# Patient Record
Sex: Female | Born: 1955 | Race: White | Hispanic: No | Smoking: Former smoker
Health system: Southern US, Community
[De-identification: ages and names within clinical notes are randomized; demographics above are authoritative.]

## PROBLEM LIST (undated history)

## (undated) DIAGNOSIS — K219 Gastro-esophageal reflux disease without esophagitis: Secondary | ICD-10-CM

## (undated) DIAGNOSIS — J9691 Respiratory failure, unspecified with hypoxia: Secondary | ICD-10-CM

## (undated) DIAGNOSIS — I10 Essential (primary) hypertension: Secondary | ICD-10-CM

## (undated) DIAGNOSIS — J189 Pneumonia, unspecified organism: Secondary | ICD-10-CM

## (undated) DIAGNOSIS — J449 Chronic obstructive pulmonary disease, unspecified: Secondary | ICD-10-CM

## (undated) HISTORY — PX: ENDOMETRIAL ABLATION: SHX621

## (undated) HISTORY — DX: Gastro-esophageal reflux disease without esophagitis: K21.9

## (undated) HISTORY — DX: Chronic obstructive pulmonary disease, unspecified: J44.9

---

## 1999-04-29 ENCOUNTER — Other Ambulatory Visit: Admission: RE | Admit: 1999-04-29 | Discharge: 1999-04-29 | Payer: Self-pay | Admitting: Obstetrics and Gynecology

## 1999-05-29 ENCOUNTER — Other Ambulatory Visit: Admission: RE | Admit: 1999-05-29 | Discharge: 1999-05-29 | Payer: Self-pay | Admitting: *Deleted

## 1999-06-14 ENCOUNTER — Other Ambulatory Visit: Admission: RE | Admit: 1999-06-14 | Discharge: 1999-06-14 | Payer: Self-pay | Admitting: *Deleted

## 1999-06-14 ENCOUNTER — Encounter (INDEPENDENT_AMBULATORY_CARE_PROVIDER_SITE_OTHER): Payer: Self-pay

## 1999-10-02 ENCOUNTER — Other Ambulatory Visit: Admission: RE | Admit: 1999-10-02 | Discharge: 1999-10-02 | Payer: Self-pay | Admitting: Obstetrics and Gynecology

## 2000-01-09 ENCOUNTER — Encounter (INDEPENDENT_AMBULATORY_CARE_PROVIDER_SITE_OTHER): Payer: Self-pay | Admitting: Specialist

## 2000-01-09 ENCOUNTER — Ambulatory Visit (HOSPITAL_COMMUNITY): Admission: RE | Admit: 2000-01-09 | Discharge: 2000-01-09 | Payer: Self-pay | Admitting: Obstetrics and Gynecology

## 2000-10-27 ENCOUNTER — Other Ambulatory Visit: Admission: RE | Admit: 2000-10-27 | Discharge: 2000-10-27 | Payer: Self-pay | Admitting: Obstetrics and Gynecology

## 2001-11-10 ENCOUNTER — Other Ambulatory Visit: Admission: RE | Admit: 2001-11-10 | Discharge: 2001-11-10 | Payer: Self-pay | Admitting: Obstetrics and Gynecology

## 2002-11-28 ENCOUNTER — Other Ambulatory Visit: Admission: RE | Admit: 2002-11-28 | Discharge: 2002-11-28 | Payer: Self-pay | Admitting: Obstetrics and Gynecology

## 2003-12-04 ENCOUNTER — Other Ambulatory Visit: Admission: RE | Admit: 2003-12-04 | Discharge: 2003-12-04 | Payer: Self-pay | Admitting: Obstetrics and Gynecology

## 2005-01-01 ENCOUNTER — Other Ambulatory Visit: Admission: RE | Admit: 2005-01-01 | Discharge: 2005-01-01 | Payer: Self-pay | Admitting: Obstetrics and Gynecology

## 2005-05-01 ENCOUNTER — Ambulatory Visit (HOSPITAL_COMMUNITY): Admission: RE | Admit: 2005-05-01 | Discharge: 2005-05-01 | Payer: Self-pay | Admitting: Obstetrics and Gynecology

## 2005-05-01 ENCOUNTER — Encounter (INDEPENDENT_AMBULATORY_CARE_PROVIDER_SITE_OTHER): Payer: Self-pay | Admitting: *Deleted

## 2007-12-14 ENCOUNTER — Ambulatory Visit: Payer: Self-pay | Admitting: Critical Care Medicine

## 2007-12-14 DIAGNOSIS — J329 Chronic sinusitis, unspecified: Secondary | ICD-10-CM | POA: Insufficient documentation

## 2007-12-14 DIAGNOSIS — J449 Chronic obstructive pulmonary disease, unspecified: Secondary | ICD-10-CM

## 2007-12-14 DIAGNOSIS — K219 Gastro-esophageal reflux disease without esophagitis: Secondary | ICD-10-CM | POA: Insufficient documentation

## 2007-12-14 DIAGNOSIS — J8410 Pulmonary fibrosis, unspecified: Secondary | ICD-10-CM

## 2007-12-14 DIAGNOSIS — J309 Allergic rhinitis, unspecified: Secondary | ICD-10-CM | POA: Insufficient documentation

## 2007-12-15 ENCOUNTER — Ambulatory Visit: Payer: Self-pay | Admitting: Critical Care Medicine

## 2007-12-17 ENCOUNTER — Ambulatory Visit: Payer: Self-pay | Admitting: Cardiology

## 2008-05-05 ENCOUNTER — Ambulatory Visit: Payer: Self-pay | Admitting: Vascular Surgery

## 2010-12-19 ENCOUNTER — Ambulatory Visit: Payer: Self-pay | Admitting: Internal Medicine

## 2011-01-16 ENCOUNTER — Ambulatory Visit
Admission: RE | Admit: 2011-01-16 | Discharge: 2011-01-16 | Payer: Self-pay | Source: Home / Self Care | Attending: Family Medicine | Admitting: Family Medicine

## 2011-01-23 ENCOUNTER — Ambulatory Visit
Admission: RE | Admit: 2011-01-23 | Discharge: 2011-01-23 | Payer: Self-pay | Source: Home / Self Care | Attending: Family Medicine | Admitting: Family Medicine

## 2011-01-23 ENCOUNTER — Encounter
Admission: RE | Admit: 2011-01-23 | Discharge: 2011-01-23 | Payer: Self-pay | Source: Home / Self Care | Attending: Family Medicine | Admitting: Family Medicine

## 2011-02-04 ENCOUNTER — Ambulatory Visit (INDEPENDENT_AMBULATORY_CARE_PROVIDER_SITE_OTHER): Payer: BC Managed Care – PPO | Admitting: Family Medicine

## 2011-02-04 DIAGNOSIS — D649 Anemia, unspecified: Secondary | ICD-10-CM

## 2011-05-13 NOTE — Consult Note (Signed)
NEW PATIENT CONSULTATION   Kirshenbaum, Sarabeth P  DOB:  1956/07/30                                       05/05/2008  CHART#:[id number]   The patient presents today for evaluation of a painful sensation in her  right posterior thigh.  She reported a pulling sensation and discomfort  in her posterior thigh.  This was of moderate severity to her initially  and now this has resolved.  She saw an outlying urgent care center and  was referred to [place] for further evaluation.  She does have multiple  spider vein telangiectasia around this area and there was concern that  this may be related.  She denies any history of deep venous thrombosis  or venous bleeding.  She does have some mild swelling in both legs at  the end of the day, left greater than right.  She does not have any  varicosities.   PAST MEDICAL HISTORY:  Otherwise unremarkable.  She has no major medical  difficulties.   FAMILY HISTORY:  Is negative for premature atherosclerotic disease.   SOCIAL HISTORY:  She is single with one child.  She does smoke 1/2 pack  cigarettes a day and does not drink alcohol on a regular basis.   REVIEW OF SYSTEMS:  She does have shortness of breath with exertion.  Cardiac heart murmur.  She does have wheezing.  Esophageal reflux.  History of headaches and anemia.   MEDICATION ALLERGIES:  Codeine.   MEDICATIONS CURRENTLY:  1. Nexium 40 mg daily.  2. Advair 250/50 as needed.   PHYSICAL EXAMINATION:  Weight is 150 pounds.  Height 5 feet 4 inches.  A  well-developed, well-nourished white female appearing stated age of 81.  Blood pressure is 137/75, pulse 77, respirations 18.  Her lower  extremities are noted for good color.  She does have 2+ dorsalis pedis  pulses bilaterally.  She does not have any evidence of venous saphenous  varicosities or reticular varicosities.  She does have multiple spider  vein telangiectasia bilaterally.  In her right popliteal fossa she does  have a much  more prominent telangiectasia.   She underwent handheld duplex by me and this revealed normal saphenous  vein on the right.  Normal great saphenous vein and normal small  saphenous vein on the right.  I discussed this with the patient.  I  think that her leg discomfort was totally unrelated to any venous  problem.  She will continue her usual activities and see [place] again on an  as needed basis.  I explained the telangiectasia are of cosmetic concern  only and would only recommend sclerotherapy if she has significant  concern regarding appearance.  I explained that this is not a covered  insurance issue and would be out-of-pocket.   Larina Earthly, M.D.  Electronically Signed   TFE/MEDQ  D:  05/05/2008  T:  05/06/2008  Job:  [id number]

## 2011-05-16 NOTE — Op Note (Signed)
[facility]  Patient:    Breanna Ramirez                          MRN: [id number] Proc. Date: 01/09/00 Adm. Date:  [id number] Attending:  Osborn Coho                           Operative Report  PREOPERATIVE DIAGNOSIS:       Menorrhagia.  POSTOPERATIVE DIAGNOSIS:      Menorrhagia.  PROCEDURE:                    1. Hysteroscopy.                               2. Dilation and curettage.  SURGEON:                      Mark E. Dareen Piano, M.D.  ANESTHESIA:                   General.  ANTIBIOTICS:                  Ancef 1 gm.  DRAINS:                       Red rubber catheter of the bladder.  COMPLICATIONS:                None.  ESTIMATED BLOOD LOSS:         50 cc.  SPECIMENS:                    Endometrial and endocervical curettings sent to pathology.  DESCRIPTION OF PROCEDURE:     The patient was taken to the operating room where she was placed in the dorsosupine position.  A general anesthetic was administered without complications.  She was then placed in the dorsolithotomy position and prepped with Hibiclens. She was draped in the usual fashion for this procedure. A sterile speculum was placed in the vagina, single-tooth tenaculum applied to the anterior cervical lip.  The cervix was then serially dilated to a 31 [place]. The hysteroscope was then passed into the endocervical canal which appeared to be normal.  On entering the uterine cavity, the endometrial lining appeared to be ery thickened with several scattered polyps.  No myomas were identified.  The ostia  were not visualized secondary to the thickened endometrium obstructing the view. At this point, the hysteroscope was removed and an endocervical curettage was performed.  This was followed by an aggressive endometrial curettage.  The hysteroscope was then placed back into the endometrial cavity and it appeared that the majority of the lining had been removed.  This  concluded the procedure. The patient was then to the recovery room in stable condition.  She will be sent home with Anaprox to take p.r.n. and iron 325 mg p.o. b.i.d.   She will follow up in the office in two weeks. DD:  01/09/00 TD:  01/09/00 Job: [id number]

## 2011-05-16 NOTE — Op Note (Signed)
[name], Breanna Ramirez                  ACCOUNT NO.:  [id number]   MEDICAL RECORD NO.:  [id number]          PATIENT TYPE:  AMB   LOCATION:  SDC                           FACILITY:  [facility]   PHYSICIAN:  Malva Limes, M.D.    DATE OF BIRTH:  [birth year]   DATE OF PROCEDURE:  05/01/2005  DATE OF DISCHARGE:                                 OPERATIVE REPORT   PREOPERATIVE DIAGNOSES:  Menorrhagia.   POSTOPERATIVE DIAGNOSES:  Menorrhagia.   PROCEDURE:  1.  Dilation and curettage.  2.  NovaSure endometrial ablation.   SURGEON:  Malva Limes, M.D.   ANESTHESIA:  General.   ANTIBIOTICS:  Ancef 1 g.   DRAINS:  Red rubber catheter to the bladder.   COMPLICATIONS:  None.   SPECIMENS:  Endometrial curettings sent to pathology.   DESCRIPTION OF PROCEDURE:  The patient was taken to the operating room where  a general anesthetic was administered without complications. She was then  placed in the dorsal lithotomy position, she was prepped with Hibiclens and  her bladder was drained with a red rubber catheter. She was then draped in  the usual fashion for this procedure and examination under anesthesia was  performed which revealed a small uterus of normal shape. The patient then  had a sterile speculum placed in her vagina. A single tooth tenaculum was  applied to the anterior cervical lip. The uterus was sounded to 7.5 cm. The  cervix was then serially dilated to a 81 [place]. The 8 Hegar dilator was  used to measure the endocervical length which was 2.5 cm giving an  endometrial cavity length of 5. Endometrial curettage was then performed  with a minimal amount of tissue being obtained. At this point, the NovaSure  device was placed into the uterine cavity and a seating procedure performed.  The width of the uterus was only 2.3 cm. Because of this concern, the device  was removed and the width of the uterus palpated with a probe and found not  to be as wide as normal. Again the device was placed  in the uterine cavity.  At this time, the width was 2.4 cm. Following this, a seal  test was  performed and passed. The machine was then turned on and was on for a total  of  1 minute and 25 seconds. The power was 66 watts. The patient tolerated the  procedure well. She was taken to the recovery room in stable condition.  Instrument and lap counts were correct x1. The patient will be discharged to  home. She will be instructed to followup in the office in one week.      MA/MEDQ  D:  05/01/2005  T:  05/01/2005  Job:  [id number]

## 2011-10-22 ENCOUNTER — Other Ambulatory Visit: Payer: Self-pay | Admitting: Family Medicine

## 2011-10-22 DIAGNOSIS — Z1231 Encounter for screening mammogram for malignant neoplasm of breast: Secondary | ICD-10-CM

## 2011-11-12 ENCOUNTER — Ambulatory Visit
Admission: RE | Admit: 2011-11-12 | Discharge: 2011-11-12 | Disposition: A | Discharge status: Home / Self Care | Payer: BC Managed Care – PPO | Source: Ambulatory Visit | Attending: Family Medicine | Admitting: Family Medicine

## 2011-11-12 DIAGNOSIS — Z1231 Encounter for screening mammogram for malignant neoplasm of breast: Secondary | ICD-10-CM

## 2012-04-26 DIAGNOSIS — Z720 Tobacco use: Secondary | ICD-10-CM | POA: Diagnosis present

## 2012-04-26 DIAGNOSIS — J449 Chronic obstructive pulmonary disease, unspecified: Secondary | ICD-10-CM | POA: Insufficient documentation

## 2012-10-14 ENCOUNTER — Other Ambulatory Visit: Payer: Self-pay | Admitting: Family Medicine

## 2012-10-14 DIAGNOSIS — Z1231 Encounter for screening mammogram for malignant neoplasm of breast: Secondary | ICD-10-CM

## 2012-11-17 ENCOUNTER — Ambulatory Visit
Admission: RE | Admit: 2012-11-17 | Discharge: 2012-11-17 | Disposition: A | Discharge status: Home / Self Care | Payer: BC Managed Care – PPO | Source: Ambulatory Visit | Attending: Family Medicine | Admitting: Family Medicine

## 2012-11-17 DIAGNOSIS — Z1231 Encounter for screening mammogram for malignant neoplasm of breast: Secondary | ICD-10-CM

## 2013-10-18 ENCOUNTER — Other Ambulatory Visit: Payer: Self-pay | Admitting: Family Medicine

## 2013-10-18 DIAGNOSIS — Z780 Asymptomatic menopausal state: Secondary | ICD-10-CM

## 2013-10-18 DIAGNOSIS — Z1231 Encounter for screening mammogram for malignant neoplasm of breast: Secondary | ICD-10-CM

## 2013-11-18 ENCOUNTER — Ambulatory Visit
Admission: RE | Admit: 2013-11-18 | Discharge: 2013-11-18 | Disposition: A | Discharge status: Home / Self Care | Payer: BC Managed Care – PPO | Source: Ambulatory Visit | Attending: Family Medicine | Admitting: Family Medicine

## 2013-11-18 DIAGNOSIS — Z1231 Encounter for screening mammogram for malignant neoplasm of breast: Secondary | ICD-10-CM

## 2013-11-18 DIAGNOSIS — Z780 Asymptomatic menopausal state: Secondary | ICD-10-CM

## 2014-08-09 ENCOUNTER — Telehealth: Payer: Self-pay | Admitting: Nurse Practitioner

## 2014-08-09 NOTE — Telephone Encounter (Signed)
appt given for wed 9/16 at 2:20

## 2014-09-13 ENCOUNTER — Encounter (INDEPENDENT_AMBULATORY_CARE_PROVIDER_SITE_OTHER): Payer: Self-pay

## 2014-09-13 ENCOUNTER — Encounter: Payer: Self-pay | Admitting: Family

## 2014-09-13 ENCOUNTER — Ambulatory Visit (INDEPENDENT_AMBULATORY_CARE_PROVIDER_SITE_OTHER): Payer: BC Managed Care – PPO | Admitting: Family

## 2014-09-13 VITALS — BP 160/81 | HR 80 | Temp 97.1°F | Ht 65.0 in | Wt 153.8 lb

## 2014-09-13 DIAGNOSIS — Z01419 Encounter for gynecological examination (general) (routine) without abnormal findings: Secondary | ICD-10-CM

## 2014-09-13 DIAGNOSIS — Z0000 Encounter for general adult medical examination without abnormal findings: Secondary | ICD-10-CM

## 2014-09-13 DIAGNOSIS — K219 Gastro-esophageal reflux disease without esophagitis: Secondary | ICD-10-CM

## 2014-09-13 DIAGNOSIS — Z124 Encounter for screening for malignant neoplasm of cervix: Secondary | ICD-10-CM

## 2014-09-13 DIAGNOSIS — I10 Essential (primary) hypertension: Secondary | ICD-10-CM | POA: Insufficient documentation

## 2014-09-13 MED ORDER — ESOMEPRAZOLE MAGNESIUM 40 MG PO CPDR: 40.0000 mg | DELAYED_RELEASE_CAPSULE | Freq: Every day | ORAL | Status: AC

## 2014-09-13 MED ORDER — HYDROCHLOROTHIAZIDE 25 MG PO TABS: 25.0000 mg | ORAL_TABLET | Freq: Every day | ORAL | Status: DC

## 2014-09-13 NOTE — Patient Instructions (Signed)

## 2014-09-13 NOTE — Progress Notes (Signed)
Subjective:    Patient ID: Breanna Ramirez, female    DOB: July 02, 1956, 58 y.o.   MRN: [id number]  Pt presents to office to establish care and for annual physical with pap.  Hypertension This is a new problem. The current episode started today. The problem is unchanged. The problem is uncontrolled. Pertinent negatives include no anxiety, chest pain, headaches, palpitations, peripheral edema or shortness of breath. Risk factors for coronary artery disease include post-menopausal state and sedentary lifestyle. Past treatments include nothing. The current treatment provides no improvement. There is no history of kidney disease, CAD/MI, CVA, heart failure or a thyroid problem. There is no history of sleep apnea.  Gastrophageal Reflux She reports no belching, no chest pain, no coughing, no heartburn or no sore throat. This is a chronic problem. The current episode started more than 1 year ago. The problem occurs rarely. The problem has been resolved. The symptoms are aggravated by certain foods. Pertinent negatives include no muscle weakness or orthopnea. She has tried a PPI for the symptoms. The treatment provided significant relief.      Review of Systems  Constitutional: Negative.   HENT: Negative.  Negative for sore throat.   Eyes: Negative.   Respiratory: Negative.  Negative for cough and shortness of breath.   Cardiovascular: Negative.  Negative for chest pain and palpitations.  Gastrointestinal: Negative.  Negative for heartburn.  Endocrine: Negative.   Genitourinary: Negative.   Musculoskeletal: Negative.  Negative for muscle weakness.  Neurological: Negative.  Negative for headaches.  Hematological: Negative.   Psychiatric/Behavioral: Negative.   All other systems reviewed and are negative.      Objective:   Physical Exam  Vitals reviewed. Constitutional: She is oriented to person, place, and time. She appears well-developed and well-nourished. No distress.  HENT:  Head:  Normocephalic and atraumatic.  Right Ear: External ear normal.  Mouth/Throat: Oropharynx is clear and moist.  Eyes: Pupils are equal, round, and reactive to light.  Neck: Normal range of motion. Neck supple. No thyromegaly present.  Cardiovascular: Normal rate, regular rhythm, normal heart sounds and intact distal pulses.   No murmur heard. Pulmonary/Chest: Effort normal and breath sounds normal. No respiratory distress. She has no wheezes. Right breast exhibits no inverted nipple, no mass, no nipple discharge, no skin change and no tenderness. Left breast exhibits no inverted nipple, no mass, no nipple discharge, no skin change and no tenderness. Breasts are symmetrical.  Abdominal: Soft. Bowel sounds are normal. She exhibits no distension. There is no tenderness.  Genitourinary:  Bimanual exam- no adnexal masses or tenderness, ovaries nonpalpable   Cervix parous and pink- No discharge   Musculoskeletal: Normal range of motion. She exhibits no edema and no tenderness.  Neurological: She is alert and oriented to person, place, and time. She has normal reflexes. No cranial nerve deficit.  Skin: Skin is warm and dry.  Psychiatric: She has a normal mood and affect. Her behavior is normal. Judgment and thought content normal.   BP 160/81  Pulse 80  Temp(Src) 97.1 F (36.2 C) (Oral)  Ht $R'5\' 5"'DD$  (1.651 m)  Wt 153 lb 12.8 oz (69.763 kg)  BMI 25.59 kg/m2        Assessment & Plan:  1. Gastroesophageal reflux disease without esophagitis - CMP14+EGFR - esomeprazole (NEXIUM) 40 MG capsule; Take 1 capsule (40 mg total) by mouth daily.  Dispense: 90 capsule; Refill: 4  2. Essential hypertension, benign - hydrochlorothiazide (HYDRODIURIL) 25 MG tablet; Take 1 tablet (25 mg total)  by mouth daily.  Dispense: 90 tablet; Refill: 3 - CMP14+EGFR  3. Annual physical exam - CMP14+EGFR - Lipid panel - Vit D  25 hydroxy (rtn osteoporosis monitoring) - Pap IG w/ reflex to HPV when ASC-U  4.  Encounter for routine gynecological examination - CMP14+EGFR - Pap IG w/ reflex to HPV when ASC-U   Continue all meds Labs pending Health Maintenance reviewed Diet and exercise encouraged RTO 2 weeks for blood pressure recheck  Evelina Dun, FNP

## 2014-09-14 LAB — CMP14+EGFR
ALK PHOS: 76 IU/L (ref 39–117)
ALT: 5 IU/L (ref 0–32)
AST: 11 IU/L (ref 0–40)
Albumin/Globulin Ratio: 1.8 (ref 1.1–2.5)
Albumin: 4.3 g/dL (ref 3.5–5.5)
BUN/Creatinine Ratio: 16 (ref 9–23)
BUN: 12 mg/dL (ref 6–24)
CALCIUM: 9.8 mg/dL (ref 8.7–10.2)
CO2: 23 mmol/L (ref 18–29)
CREATININE: 0.77 mg/dL (ref 0.57–1.00)
Chloride: 103 mmol/L (ref 97–108)
GFR calc Af Amer: 98 mL/min/{1.73_m2} (ref >59)
GFR calc non Af Amer: 85 mL/min/{1.73_m2} (ref >59)
GLOBULIN, TOTAL: 2.4 g/dL (ref 1.5–4.5)
Glucose: 88 mg/dL (ref 65–99)
Potassium: 4.6 mmol/L (ref 3.5–5.2)
Sodium: 141 mmol/L (ref 134–144)
Total Bilirubin: <0.2 mg/dL (ref 0.0–1.2)
Total Protein: 6.7 g/dL (ref 6.0–8.5)

## 2014-09-14 LAB — PAP IG W/ RFLX HPV ASCU: PAP Smear Comment: 0

## 2014-09-14 LAB — LIPID PANEL
CHOLESTEROL TOTAL: 201 mg/dL — AB (ref 100–199)
Chol/HDL Ratio: 4.6 ratio units — ABNORMAL HIGH (ref 0.0–4.4)
HDL: 44 mg/dL (ref >39)
LDL Calculated: 141 mg/dL — ABNORMAL HIGH (ref 0–99)
Triglycerides: 79 mg/dL (ref 0–149)
VLDL CHOLESTEROL CAL: 16 mg/dL (ref 5–40)

## 2014-09-14 LAB — VITAMIN D 25 HYDROXY (VIT D DEFICIENCY, FRACTURES): VIT D 25 HYDROXY: 35.4 ng/mL (ref 30.0–100.0)

## 2014-09-15 ENCOUNTER — Other Ambulatory Visit: Payer: Self-pay | Admitting: Family

## 2014-09-15 MED ORDER — SIMVASTATIN 40 MG PO TABS: 40.0000 mg | ORAL_TABLET | Freq: Every day | ORAL | Status: AC

## 2014-10-03 ENCOUNTER — Ambulatory Visit (INDEPENDENT_AMBULATORY_CARE_PROVIDER_SITE_OTHER): Payer: BC Managed Care – PPO | Admitting: Family

## 2014-10-03 ENCOUNTER — Encounter: Payer: Self-pay | Admitting: Family

## 2014-10-03 VITALS — BP 130/68 | HR 85 | Temp 97.3°F | Ht 65.0 in | Wt 156.0 lb

## 2014-10-03 DIAGNOSIS — I10 Essential (primary) hypertension: Secondary | ICD-10-CM

## 2014-10-03 NOTE — Patient Instructions (Signed)

## 2014-10-03 NOTE — Progress Notes (Signed)
   Subjective:    Patient ID: Breanna Ramirez, female    DOB: 10/25/1956, 58 y.o.   MRN: [id number]  Hypertension Pertinent negatives include no headaches, palpitations or shortness of breath.   Pt presents to the office for follow-up for hypertension. Pt's blood pressure is not at goal today, but brought in home readings for the last two weeks with avg being 120's/ 60's. PT denies any headache, palpitations, SOB, or edema.    Review of Systems  Constitutional: Negative.   HENT: Negative.   Eyes: Negative.   Respiratory: Negative.  Negative for shortness of breath.   Cardiovascular: Negative.  Negative for palpitations.  Gastrointestinal: Negative.   Endocrine: Negative.   Genitourinary: Negative.   Musculoskeletal: Negative.   Neurological: Negative.  Negative for headaches.  Hematological: Negative.   Psychiatric/Behavioral: Negative.   All other systems reviewed and are negative.      Objective:   Physical Exam  Vitals reviewed. Constitutional: She is oriented to person, place, and time. She appears well-developed and well-nourished. No distress.  HENT:  Head: Normocephalic and atraumatic.  Right Ear: External ear normal.  Mouth/Throat: Oropharynx is clear and moist.  Eyes: Pupils are equal, round, and reactive to light.  Neck: Normal range of motion. Neck supple. No thyromegaly present.  Cardiovascular: Normal rate, regular rhythm, normal heart sounds and intact distal pulses.   No murmur heard. Pulmonary/Chest: Effort normal and breath sounds normal. No respiratory distress. She has no wheezes.  Abdominal: Soft. Bowel sounds are normal. She exhibits no distension. There is no tenderness.  Musculoskeletal: Normal range of motion. She exhibits no edema and no tenderness.  Neurological: She is alert and oriented to person, place, and time. She has normal reflexes. No cranial nerve deficit.  Skin: Skin is warm and dry.  Psychiatric: She has a normal mood and affect. Her  behavior is normal. Judgment and thought content normal.    BP 151/79  Pulse 85  Temp(Src) 97.3 F (36.3 C) (Oral)  Ht 5\' 5"  (1.651 m)  Wt 156 lb (70.761 kg)  BMI 25.96 kg/m2       Assessment & Plan:  1. Essential hypertension, benign -Daily blood pressure log given with instructions on how to fill out and told to bring to next visit -Dash diet information given -Exercise encouraged - Stress Management  -Continue current meds -RTO in 1 year  Jannifer Rodneyhristy Charlesa Ehle, FNP

## 2014-10-16 ENCOUNTER — Other Ambulatory Visit: Payer: Self-pay

## 2014-10-16 DIAGNOSIS — Z1231 Encounter for screening mammogram for malignant neoplasm of breast: Secondary | ICD-10-CM

## 2014-11-20 ENCOUNTER — Ambulatory Visit
Admission: RE | Admit: 2014-11-20 | Discharge: 2014-11-20 | Disposition: A | Discharge status: Home / Self Care | Payer: BC Managed Care – PPO | Source: Ambulatory Visit

## 2014-11-20 DIAGNOSIS — Z1231 Encounter for screening mammogram for malignant neoplasm of breast: Secondary | ICD-10-CM

## 2015-04-13 ENCOUNTER — Ambulatory Visit (INDEPENDENT_AMBULATORY_CARE_PROVIDER_SITE_OTHER): Payer: BLUE CROSS/BLUE SHIELD | Admitting: Family Medicine

## 2015-04-13 ENCOUNTER — Encounter: Payer: Self-pay | Admitting: Family Medicine

## 2015-04-13 VITALS — BP 132/66 | HR 97 | Temp 97.9°F | Ht 65.0 in | Wt 156.6 lb

## 2015-04-13 DIAGNOSIS — M7581 Other shoulder lesions, right shoulder: Secondary | ICD-10-CM | POA: Diagnosis not present

## 2015-04-13 DIAGNOSIS — M778 Other enthesopathies, not elsewhere classified: Secondary | ICD-10-CM

## 2015-04-13 MED ORDER — DICLOFENAC SODIUM 75 MG PO TBEC: 75.0000 mg | DELAYED_RELEASE_TABLET | Freq: Two times a day (BID) | ORAL | Status: DC

## 2015-04-13 NOTE — Progress Notes (Signed)
Subjective:  Patient ID: Breanna Ramirez, female    DOB: 03-May-1956  Age: 59 y.o. MRN: [id number]  CC: Arm Pain   HPI Breanna Ramirez presents for right arm pain of 3 days' duration. She has been doing a lot of ironing at work. This is goes on for several hours at a time daily now since Monday which is 4 days ago now. The pain is moderate in severity. She feels a pop in the area of her shoulder she points to the front of the arm where it attaches to the body just above the axilla. She has not had any injury that she knows of other than the chronic heavy use for the last several days. She is not used to this type of heavy work in the past.  History Breanna Ramirez has a past medical history of COPD (chronic obstructive pulmonary disease) and GERD (gastroesophageal reflux disease).   She has no past surgical history on file.   Her family history includes Cancer in her father.She reports that she has been smoking.  She does not have any smokeless tobacco history on file. She reports that she does not drink alcohol or use illicit drugs.  Current Outpatient Prescriptions on File Prior to Visit  Medication Sig Dispense Refill  . esomeprazole (NEXIUM) 40 MG capsule Take 1 capsule (40 mg total) by mouth daily. 90 capsule 4  . hydrochlorothiazide (HYDRODIURIL) 25 MG tablet Take 1 tablet (25 mg total) by mouth daily. 90 tablet 3  . simvastatin (ZOCOR) 40 MG tablet Take 1 tablet (40 mg total) by mouth at bedtime. 90 tablet 3   No current facility-administered medications on file prior to visit.    ROS Review of Systems  Constitutional: Negative for fever, chills, diaphoresis, appetite change, fatigue and unexpected weight change.  HENT: Negative for congestion, ear pain, hearing loss, postnasal drip, rhinorrhea, sneezing, sore throat and trouble swallowing.   Eyes: Negative for pain.  Respiratory: Negative for cough, chest tightness and shortness of breath.   Cardiovascular: Negative for chest pain and  palpitations.  Gastrointestinal: Negative for nausea, vomiting, abdominal pain, diarrhea and constipation.  Genitourinary: Negative for dysuria, frequency and menstrual problem.  Musculoskeletal: Negative for joint swelling and arthralgias.  Skin: Negative for rash.  Neurological: Negative for dizziness, weakness, numbness and headaches.  Psychiatric/Behavioral: Negative for dysphoric mood and agitation.    Objective:  BP 132/66 mmHg  Pulse 97  Temp(Src) 97.9 F (36.6 C) (Oral)  Ht 5\' 5"  (1.651 m)  Wt 156 lb 9.6 oz (71.033 kg)  BMI 26.06 kg/m2  BP Readings from Last 3 Encounters:  04/13/15 132/66  10/03/14 130/68  09/13/14 160/81    Wt Readings from Last 3 Encounters:  04/13/15 156 lb 9.6 oz (71.033 kg)  10/03/14 156 lb (70.761 kg)  09/13/14 153 lb 12.8 oz (69.763 kg)     Physical Exam  Constitutional: She is oriented to person, place, and time. She appears well-developed and well-nourished. No distress.  HENT:  Head: Normocephalic and atraumatic.  Right Ear: External ear normal.  Left Ear: External ear normal.  Nose: Nose normal.  Mouth/Throat: Oropharynx is clear and moist.  Eyes: Conjunctivae and EOM are normal. Pupils are equal, round, and reactive to light.  Neck: Normal range of motion. Neck supple. No thyromegaly present.  Cardiovascular: Normal rate, regular rhythm and normal heart sounds.   No murmur heard. Pulmonary/Chest: Effort normal and breath sounds normal. No respiratory distress. She has no wheezes. She has no rales.  Abdominal: Soft. Bowel sounds are normal. She exhibits no distension. There is no tenderness.  Lymphadenopathy:    She has no cervical adenopathy.  Neurological: She is alert and oriented to person, place, and time. She has normal reflexes.  Skin: Skin is warm and dry.  Psychiatric: She has a normal mood and affect. Her behavior is normal. Judgment and thought content normal.    No results found for: HGBA1C  Lab Results  Component  Value Date   GLUCOSE 88 09/13/2014   CHOL 201* 09/13/2014   TRIG 79 09/13/2014   HDL 44 09/13/2014   LDLCALC 141* 09/13/2014   ALT 5 09/13/2014   AST 11 09/13/2014   NA 141 09/13/2014   K 4.6 09/13/2014   CL 103 09/13/2014   CREATININE 0.77 09/13/2014   BUN 12 09/13/2014   CO2 23 09/13/2014    Mm Digital Screening Bilateral  11/20/2014   CLINICAL DATA:  Screening.  EXAM: DIGITAL SCREENING BILATERAL MAMMOGRAM WITH CAD  COMPARISON:  11/18/2013, 11/17/2012 and 11/13/2011  ACR Breast Density Category b: There are scattered areas of fibroglandular density.  FINDINGS: There are no findings suspicious for malignancy. Images were processed with CAD.  IMPRESSION: No mammographic evidence of malignancy. A result letter of this screening mammogram will be mailed directly to the patient.  RECOMMENDATION: Screening mammogram in one year. (Code:SM-B-01Y)  BI-RADS CATEGORY  1: Negative.   Electronically Signed   By: Fannie Knee   On: 11/20/2014 16:39    Assessment & Plan:   Breann was seen today for arm pain.  Diagnoses and all orders for this visit:  Tendonitis of shoulder, right  Other orders -     diclofenac (VOLTAREN) 75 MG EC tablet; Take 1 tablet (75 mg total) by mouth 2 (two) times daily.  I am having Ms. Jon start on diclofenac. I am also having her maintain her hydrochlorothiazide, esomeprazole, and simvastatin.  Meds ordered this encounter  Medications  . diclofenac (VOLTAREN) 75 MG EC tablet    Sig: Take 1 tablet (75 mg total) by mouth 2 (two) times daily.    Dispense:  60 tablet    Refill:  2     Follow-up: Return in about 2 weeks (around 04/27/2015), or if symptoms worsen or fail to improve.  Mechele Claude, M.D.

## 2015-04-16 ENCOUNTER — Telehealth: Payer: Self-pay | Admitting: Family Medicine

## 2015-04-17 NOTE — Telephone Encounter (Signed)
Letter has been faxed. Patient aware.

## 2015-04-17 NOTE — Telephone Encounter (Signed)
She should not lift anything above shoulder height. Otherwise she can lift up to 10 pounds.

## 2015-10-17 ENCOUNTER — Other Ambulatory Visit: Payer: Self-pay | Admitting: Family

## 2015-10-17 DIAGNOSIS — R5381 Other malaise: Secondary | ICD-10-CM

## 2015-10-29 ENCOUNTER — Other Ambulatory Visit: Payer: Self-pay

## 2015-10-29 DIAGNOSIS — Z1231 Encounter for screening mammogram for malignant neoplasm of breast: Secondary | ICD-10-CM

## 2015-11-27 ENCOUNTER — Ambulatory Visit
Admission: RE | Admit: 2015-11-27 | Discharge: 2015-11-27 | Disposition: A | Discharge status: Home / Self Care | Payer: BLUE CROSS/BLUE SHIELD | Source: Ambulatory Visit

## 2015-11-27 DIAGNOSIS — Z1231 Encounter for screening mammogram for malignant neoplasm of breast: Secondary | ICD-10-CM

## 2016-10-30 ENCOUNTER — Other Ambulatory Visit: Payer: Self-pay | Admitting: Family

## 2016-10-30 DIAGNOSIS — Z1231 Encounter for screening mammogram for malignant neoplasm of breast: Secondary | ICD-10-CM

## 2016-11-26 ENCOUNTER — Other Ambulatory Visit: Payer: Self-pay | Admitting: Family

## 2016-11-26 DIAGNOSIS — Z780 Asymptomatic menopausal state: Secondary | ICD-10-CM

## 2016-11-27 ENCOUNTER — Ambulatory Visit
Admission: RE | Admit: 2016-11-27 | Discharge: 2016-11-27 | Disposition: A | Discharge status: Home / Self Care | Payer: BLUE CROSS/BLUE SHIELD | Source: Ambulatory Visit | Attending: Family | Admitting: Family

## 2016-11-27 DIAGNOSIS — Z1231 Encounter for screening mammogram for malignant neoplasm of breast: Secondary | ICD-10-CM

## 2016-12-01 ENCOUNTER — Other Ambulatory Visit: Payer: Self-pay | Admitting: Family

## 2016-12-01 DIAGNOSIS — R928 Other abnormal and inconclusive findings on diagnostic imaging of breast: Secondary | ICD-10-CM

## 2016-12-04 ENCOUNTER — Ambulatory Visit
Admission: RE | Admit: 2016-12-04 | Discharge: 2016-12-04 | Disposition: A | Discharge status: Home / Self Care | Payer: BLUE CROSS/BLUE SHIELD | Source: Ambulatory Visit | Attending: Family | Admitting: Family

## 2016-12-04 DIAGNOSIS — Z780 Asymptomatic menopausal state: Secondary | ICD-10-CM

## 2016-12-09 ENCOUNTER — Other Ambulatory Visit: Payer: Self-pay | Admitting: Family Medicine

## 2016-12-09 ENCOUNTER — Ambulatory Visit
Admission: RE | Admit: 2016-12-09 | Discharge: 2016-12-09 | Disposition: A | Discharge status: Home / Self Care | Payer: BLUE CROSS/BLUE SHIELD | Source: Ambulatory Visit | Attending: Family Medicine | Admitting: Family Medicine

## 2016-12-09 ENCOUNTER — Other Ambulatory Visit: Payer: BLUE CROSS/BLUE SHIELD

## 2016-12-09 DIAGNOSIS — R928 Other abnormal and inconclusive findings on diagnostic imaging of breast: Secondary | ICD-10-CM

## 2017-05-29 DIAGNOSIS — J189 Pneumonia, unspecified organism: Secondary | ICD-10-CM

## 2017-05-29 DIAGNOSIS — J9691 Respiratory failure, unspecified with hypoxia: Secondary | ICD-10-CM

## 2017-05-29 HISTORY — DX: Pneumonia, unspecified organism: J18.9

## 2017-05-29 HISTORY — DX: Respiratory failure, unspecified with hypoxia: J96.91

## 2017-06-18 ENCOUNTER — Encounter (HOSPITAL_COMMUNITY): Payer: Self-pay | Admitting: Emergency Medicine

## 2017-06-18 ENCOUNTER — Inpatient Hospital Stay (HOSPITAL_COMMUNITY)
Admission: EM | Admit: 2017-06-18 | Discharge: 2017-06-22 | DRG: 193 | Disposition: A | Discharge status: Home Health | Payer: BLUE CROSS/BLUE SHIELD | Attending: Oncology | Admitting: Oncology

## 2017-06-18 DIAGNOSIS — I700 Atherosclerosis of aorta: Secondary | ICD-10-CM | POA: Diagnosis present

## 2017-06-18 DIAGNOSIS — J9690 Respiratory failure, unspecified, unspecified whether with hypoxia or hypercapnia: Secondary | ICD-10-CM

## 2017-06-18 DIAGNOSIS — Z9981 Dependence on supplemental oxygen: Secondary | ICD-10-CM

## 2017-06-18 DIAGNOSIS — J8410 Pulmonary fibrosis, unspecified: Secondary | ICD-10-CM | POA: Diagnosis present

## 2017-06-18 DIAGNOSIS — E785 Hyperlipidemia, unspecified: Secondary | ICD-10-CM | POA: Diagnosis present

## 2017-06-18 DIAGNOSIS — J189 Pneumonia, unspecified organism: Secondary | ICD-10-CM | POA: Diagnosis not present

## 2017-06-18 DIAGNOSIS — Z888 Allergy status to other drugs, medicaments and biological substances status: Secondary | ICD-10-CM

## 2017-06-18 DIAGNOSIS — F17210 Nicotine dependence, cigarettes, uncomplicated: Secondary | ICD-10-CM | POA: Diagnosis present

## 2017-06-18 DIAGNOSIS — J440 Chronic obstructive pulmonary disease with acute lower respiratory infection: Secondary | ICD-10-CM | POA: Diagnosis present

## 2017-06-18 DIAGNOSIS — J9621 Acute and chronic respiratory failure with hypoxia: Secondary | ICD-10-CM

## 2017-06-18 DIAGNOSIS — J309 Allergic rhinitis, unspecified: Secondary | ICD-10-CM | POA: Diagnosis present

## 2017-06-18 DIAGNOSIS — J181 Lobar pneumonia, unspecified organism: Secondary | ICD-10-CM | POA: Diagnosis not present

## 2017-06-18 DIAGNOSIS — K219 Gastro-esophageal reflux disease without esophagitis: Secondary | ICD-10-CM | POA: Diagnosis present

## 2017-06-18 DIAGNOSIS — J9611 Chronic respiratory failure with hypoxia: Secondary | ICD-10-CM

## 2017-06-18 DIAGNOSIS — J9601 Acute respiratory failure with hypoxia: Secondary | ICD-10-CM | POA: Diagnosis present

## 2017-06-18 DIAGNOSIS — I2510 Atherosclerotic heart disease of native coronary artery without angina pectoris: Secondary | ICD-10-CM | POA: Diagnosis present

## 2017-06-18 DIAGNOSIS — Z79899 Other long term (current) drug therapy: Secondary | ICD-10-CM

## 2017-06-18 DIAGNOSIS — I10 Essential (primary) hypertension: Secondary | ICD-10-CM | POA: Diagnosis present

## 2017-06-18 DIAGNOSIS — J9691 Respiratory failure, unspecified with hypoxia: Secondary | ICD-10-CM | POA: Diagnosis not present

## 2017-06-18 DIAGNOSIS — Z7951 Long term (current) use of inhaled steroids: Secondary | ICD-10-CM

## 2017-06-18 HISTORY — DX: Pneumonia, unspecified organism: J18.9

## 2017-06-18 HISTORY — DX: Respiratory failure, unspecified with hypoxia: J96.91

## 2017-06-18 HISTORY — DX: Essential (primary) hypertension: I10

## 2017-06-18 LAB — BASIC METABOLIC PANEL
ANION GAP: 8 (ref 5–15)
BUN: 13 mg/dL (ref 6–20)
CALCIUM: 9.7 mg/dL (ref 8.9–10.3)
CO2: 27 mmol/L (ref 22–32)
Chloride: 104 mmol/L (ref 101–111)
Creatinine, Ser: 0.66 mg/dL (ref 0.44–1.00)
GLUCOSE: 98 mg/dL (ref 65–99)
Potassium: 4 mmol/L (ref 3.5–5.1)
SODIUM: 139 mmol/L (ref 135–145)

## 2017-06-18 LAB — CBC
HCT: 40.4 % (ref 36.0–46.0)
HEMOGLOBIN: 12.9 g/dL (ref 12.0–15.0)
MCH: 29.2 pg (ref 26.0–34.0)
MCHC: 31.9 g/dL (ref 30.0–36.0)
MCV: 91.4 fL (ref 78.0–100.0)
Platelets: 322 10*3/uL (ref 150–400)
RBC: 4.42 MIL/uL (ref 3.87–5.11)
RDW: 13.8 % (ref 11.5–15.5)
WBC: 13 10*3/uL — ABNORMAL HIGH (ref 4.0–10.5)

## 2017-06-18 LAB — D-DIMER, QUANTITATIVE (NOT AT ARMC): D DIMER QUANT: 0.35 ug{FEU}/mL (ref 0.00–0.50)

## 2017-06-18 LAB — TROPONIN I

## 2017-06-18 MED ORDER — DEXTROSE 5 % IV SOLN
250.0000 mg | INTRAVENOUS | Status: DC
  Administered 2017-06-19 – 2017-06-20 (×2): 250 mg via INTRAVENOUS
  Filled 2017-06-18 (×2): qty 250

## 2017-06-18 MED ORDER — IPRATROPIUM-ALBUTEROL 0.5-2.5 (3) MG/3ML IN SOLN
3.0000 mL | Freq: Once | RESPIRATORY_TRACT | Status: AC
  Administered 2017-06-18: 3 mL via RESPIRATORY_TRACT
  Filled 2017-06-18: qty 3

## 2017-06-18 MED ORDER — SIMVASTATIN 40 MG PO TABS
40.0000 mg | ORAL_TABLET | Freq: Every day | ORAL | Status: DC
  Administered 2017-06-18 – 2017-06-21 (×4): 40 mg via ORAL
  Filled 2017-06-18 (×4): qty 1

## 2017-06-18 MED ORDER — HYDROCHLOROTHIAZIDE 25 MG PO TABS
25.0000 mg | ORAL_TABLET | Freq: Every day | ORAL | Status: DC
  Administered 2017-06-18 – 2017-06-22 (×5): 25 mg via ORAL
  Filled 2017-06-18 (×5): qty 1

## 2017-06-18 MED ORDER — DEXTROSE 5 % IV SOLN
1.0000 g | Freq: Once | INTRAVENOUS | Status: AC
  Administered 2017-06-18: 1 g via INTRAVENOUS
  Filled 2017-06-18: qty 10

## 2017-06-18 MED ORDER — ALBUTEROL SULFATE (2.5 MG/3ML) 0.083% IN NEBU
2.5000 mg | INHALATION_SOLUTION | RESPIRATORY_TRACT | Status: DC | PRN
  Filled 2017-06-18: qty 3

## 2017-06-18 MED ORDER — DEXTROSE 5 % IV SOLN
500.0000 mg | Freq: Once | INTRAVENOUS | Status: AC
  Administered 2017-06-18: 500 mg via INTRAVENOUS
  Filled 2017-06-18: qty 500

## 2017-06-18 MED ORDER — DEXTROSE 5 % IV SOLN
1.0000 g | INTRAVENOUS | Status: AC
  Administered 2017-06-19 – 2017-06-22 (×4): 1 g via INTRAVENOUS
  Filled 2017-06-18 (×4): qty 10

## 2017-06-18 MED ORDER — GUAIFENESIN ER 600 MG PO TB12
600.0000 mg | ORAL_TABLET | Freq: Two times a day (BID) | ORAL | Status: DC
  Administered 2017-06-18 – 2017-06-19 (×3): 600 mg via ORAL
  Filled 2017-06-18 (×3): qty 1

## 2017-06-18 MED ORDER — ALBUTEROL (5 MG/ML) CONTINUOUS INHALATION SOLN
10.0000 mg/h | INHALATION_SOLUTION | Freq: Once | RESPIRATORY_TRACT | Status: AC
Start: 2017-06-18 — End: 2017-06-18
  Administered 2017-06-18: 10 mg/h via RESPIRATORY_TRACT
  Filled 2017-06-18: qty 20

## 2017-06-18 MED ORDER — PANTOPRAZOLE SODIUM 40 MG PO TBEC
40.0000 mg | DELAYED_RELEASE_TABLET | Freq: Every day | ORAL | Status: DC
Start: 2017-06-18 — End: 2017-06-22
  Administered 2017-06-18 – 2017-06-22 (×5): 40 mg via ORAL
  Filled 2017-06-18 (×5): qty 1

## 2017-06-18 MED ORDER — IPRATROPIUM-ALBUTEROL 0.5-2.5 (3) MG/3ML IN SOLN
3.0000 mL | Freq: Three times a day (TID) | RESPIRATORY_TRACT | Status: DC
  Administered 2017-06-18: 3 mL via RESPIRATORY_TRACT
  Filled 2017-06-18: qty 3

## 2017-06-18 MED ORDER — FLUTICASONE PROPIONATE 50 MCG/ACT NA SUSP
2.0000 | Freq: Every day | NASAL | Status: DC
  Administered 2017-06-18 – 2017-06-22 (×5): 2 via NASAL
  Filled 2017-06-18: qty 16

## 2017-06-18 MED ORDER — ENOXAPARIN SODIUM 40 MG/0.4ML ~~LOC~~ SOLN
40.0000 mg | SUBCUTANEOUS | Status: DC
  Administered 2017-06-18 – 2017-06-21 (×4): 40 mg via SUBCUTANEOUS
  Filled 2017-06-18 (×5): qty 0.4

## 2017-06-18 MED ORDER — ACETAMINOPHEN 650 MG RE SUPP: 650.0000 mg | Freq: Four times a day (QID) | RECTAL | Status: DC | PRN

## 2017-06-18 MED ORDER — IPRATROPIUM-ALBUTEROL 0.5-2.5 (3) MG/3ML IN SOLN
3.0000 mL | Freq: Three times a day (TID) | RESPIRATORY_TRACT | Status: DC
Start: 2017-06-18 — End: 2017-06-19
  Administered 2017-06-19: 3 mL via RESPIRATORY_TRACT
  Filled 2017-06-18: qty 3

## 2017-06-18 MED ORDER — ACETAMINOPHEN 325 MG PO TABS
650.0000 mg | ORAL_TABLET | Freq: Four times a day (QID) | ORAL | Status: DC | PRN
  Administered 2017-06-18 – 2017-06-21 (×4): 650 mg via ORAL
  Filled 2017-06-18 (×4): qty 2

## 2017-06-18 NOTE — ED Triage Notes (Signed)
Pt started feeling SOB last Thursday and has felt progressively worse. Pt went [facility] today and was told she has pneumonia. She states she received breathing treatment there and was [facility].

## 2017-06-18 NOTE — ED Notes (Signed)
Pt ambulated to the bath room with sats dropping to the 80%

## 2017-06-18 NOTE — H&P (Signed)
Date: 06/18/2017               Patient Name:  Breanna Ramirez MRN: [id number]  DOB: 01-Apr-1956 Age / Sex: 61 y.o., female   PCP: Mechele ClaudeStacks, Warren, MD         Medical Service: Internal Medicine Teaching Service         Attending Physician: Dr. Doug SouJacubowitz, Sam, MD    First Contact: Dr. Noemi ChapelBethany Kingjames Coury Pager: [phone]  Second Contact: Dr. Darreld McleanVishal Patel Pager: [phone]       After Hours (After 5p/  First Contact Pager: [phone]  weekends / holidays): Second Contact Pager: [phone]   Chief Complaint: shortness of breath, cough  History of Present Illness:  61 y/o F with hx of chronic allergic rhinitis, COPD, tobacco abuse, HTN, HLD who presents for evaluation of a 1 week history of progressive SOB, dry cough and chest congestion. Notes runny nose, earaches, sore throat and sinus congestion for a few days prior to onset of SOB but was otherwise in her normal state of health. She denies any fevers, chills, nausea, vomiting, diarrhea, sneezing, or sick contacts. Gets pneumonia once every 2-3 years during no particular time of the year. She does not wear oxygen nor does she take any maintenance inhalers at home. She does not take any anti-histamines or nasal sprays. She does have Advair at home which she was prescribed when she had pneumonia several years ago which she takes as needed.  [facility]ially [facility] and chest x-ray there read as right middle lobe pneumonia. [facility] department due to hypoxia with ambulation to 80%.  [facility] was afebrile, pulse 83, blood pressure 120/66, respirations 20 and she was saturating 92% on 2 L of O2. Bmet and CBC within normal limits except for a leukocytosis of 13,000. D-dimer 0.35. She was given Rocephin, azithromycin and albuterol nebulizer with improvement of her symptoms. She was subsequently admitted to [facility] for treatment of community acquired pneumonia.  Meds:  Current Meds  Medication Sig  . albuterol  (PROVENTIL HFA;VENTOLIN HFA) 108 (90 Base) MCG/ACT inhaler Inhale 1-2 puffs into the lungs every 6 (six) hours as needed for wheezing or shortness of breath.  . Aspirin-Salicylamide-Caffeine (BC HEADACHE POWDER PO) Take 1 packet by mouth daily as needed (headache).  . esomeprazole (NEXIUM) 40 MG capsule Take 1 capsule (40 mg total) by mouth daily.  . Fluticasone-Salmeterol (ADVAIR) 250-50 MCG/DOSE AEPB Inhale 1 puff into the lungs 2 (two) times daily.  . hydrochlorothiazide (HYDRODIURIL) 25 MG tablet Take 1 tablet (25 mg total) by mouth daily.  . simvastatin (ZOCOR) 40 MG tablet Take 1 tablet (40 mg total) by mouth at bedtime.   Allergies: Allergies as of 06/18/2017 - Review Complete 06/18/2017  Allergen Reaction Noted  . Prednisone Palpitations 04/26/2012  . Codeine Nausea And Vomiting 04/26/2012   Past Medical History:  Diagnosis Date  . COPD (chronic obstructive pulmonary disease) (HCC)   . GERD (gastroesophageal reflux disease)    Family History: Both parents are deceased. Father with unspecified cancer.   Social History: She is a current smoker with a 40 pack-year history. She denies any alcohol or recreational drug use. She lives alone and works at a [employer].   Review of Systems: A complete ROS was negative except as per HPI.   Physical Exam: Blood pressure 104/65, pulse (!) 102, temperature 98 F (36.7 C), temperature source Oral, resp. rate 17, SpO2 (!) 89 %.  General: In no acute distress. Resting comfortably in bed.  HENT: PERRL. EOMI. No conjunctival injection, icterus or ptosis. Oropharynx clear. Posterior pharynx with erythema and clear/green mucous.  Cardiovascular: Tachycardic. No murmur or rub appreciated. Pulmonary: Diffuse insp + expiratory wheezes. Normal WOB on 2L via [place]. Able to speak in complete sentences.   Abdomen: Soft, non-tender and non-distended. No guarding or rigidity. +bowel sounds.  Extremities: No peripheral edema noted BL. Intact distal pulses.  No gross deformities. Skin: Warm, dry. No cyanosis.  Neuro: Strength and sensation grossly intact.  Psych: Mood normal and affect was mood congruent. Responds to questions appropriately.   EKG: NSR, rate 76. No ST segment changes or TWI.   CXR: Unable to view.   Assessment & Plan by Problem: This is a 61 y/o F here with cough, SOB, chest congestion and new oxygen requirement. Found to have RML pneumonia on outside CXR. ?Hx of COPD vs pulmonary fibrosis.   Principal Problem:   Respiratory failure with hypoxia (HCC) Active Problems:   Essential hypertension, benign   CAP (community acquired pneumonia)  #Acute Hypoxic Respiratory Failure #Community Acquired Pneumonia, RML She is afebrile with mild leukocytosis. Outside CXR suggesting RML infiltrate. Hemodynamically stable although has a new oxygen requirement. She has diffuse inspiratory and expiratory wheezes throughout. Presentation consistent with CAP following a likely viral URI. Received Ceftriaxone and Azithromycin in ED.  -Continue IV Ceftriaxone and Azithromycin -2-view CXR in AM -Scheduled duonebs Q8H -Mucinex BID -Albuterol prn -CBC in AM  #Chronic allergic rhinitis #?COPD #?Pulmonary Fibrosis #Tobacco abuse No PFTs in system and pt unaware of diagnosis listed in chart. She has apparently been seen by [facility] in 2008 which documented her chronic rhinitis and hx of several pneumonias. She has not been taking any anti-histamines or nasal spray.  Review of CXR in EMR from 2012 with lung hyperaeration and scarring. CT chest from 2008 with bands of scarring, described as possible early fibrosis. She does have increased cough and a new oxygen requirement however believe symptoms are explained by CAP currently however could be component of COPD/ILD as well. She is not tolerant of prednisone.  -Scheduled duonebs and prn albuterol as above -Flonase -Would benefit from PFTs outpatient -Encouraged tobacco  cessation  #Hypertension #Hyperlipidemia Compliant with home HCTZ 25 mg daily as well as home simvastatin 40 mg daily.  BP stable here. Was expectedly tachycardic following albuterol neb. Will monitor.  -Continue HCTZ 25 mg daily -Continue Simvastatin 40 mg daily  #GERD Compliant with Nexium 40 mg daily. No n/v/heartburn. No diarrhea.  -Continue PPI  Diet: Regular IVF: none Code status: FULL - this was confirmed with the patient upon admission.  DVT PPx: Lovenox  Dispo: Admit patient to Observation with expected length of stay less than 2 midnights.  SignedNoemi Chapel, DO 06/18/2017, 2:37 PM  Pager: [phone]

## 2017-06-18 NOTE — ED Notes (Signed)
Pt placed on 2L O2 

## 2017-06-18 NOTE — ED Provider Notes (Signed)
[facility]-EMERGENCY DEPT Provider Note   CSN: [id number] Arrival date & time: 06/18/17  1027     History   Chief Complaint Chief Complaint  Patient presents with  . Shortness of Breath    HPI Breanna Ramirez is a 61 y.o. female.  HPI  61 y.o. female with a hx of COPD, presents to the Emergency Department today due to shortness of breath since Thursday that she feels is getting progressively worse. [facility] and told to come to ED for evaluation due to hypoxia. Denies CP/ABD pain. No fevers. Xray at medical center showed right middle lobe pneumonia. No N/V/D. No meds PTA. Denies pain currently. No other symptoms noted.    Past Medical History:  Diagnosis Date  . COPD (chronic obstructive pulmonary disease) (HCC)   . GERD (gastroesophageal reflux disease)     Patient Active Problem List   Diagnosis Date Noted  . Essential hypertension, benign 09/13/2014  . CAFL (chronic airflow limitation) (HCC) 04/26/2012  . SINUSITIS, CHRONIC 12/14/2007  . ALLERGIC RHINITIS 12/14/2007  . BRONCHITIS 12/14/2007  . PULMONARY FIBROSIS, POSTINFLAMMATORY 12/14/2007  . G E R D 12/14/2007    History reviewed. No pertinent surgical history.  OB History    No data available       Home Medications    Prior to Admission medications   Medication Sig Start Date End Date Taking? Authorizing Provider  diclofenac (VOLTAREN) 75 MG EC tablet Take 1 tablet (75 mg total) by mouth 2 (two) times daily. 04/13/15   Mechele ClaudeStacks, Warren, MD  esomeprazole (NEXIUM) 40 MG capsule Take 1 capsule (40 mg total) by mouth daily. 09/13/14   Jannifer RodneyHawks, Christy A, FNP  hydrochlorothiazide (HYDRODIURIL) 25 MG tablet Take 1 tablet (25 mg total) by mouth daily. 09/13/14   Junie SpencerHawks, Christy A, FNP  simvastatin (ZOCOR) 40 MG tablet Take 1 tablet (40 mg total) by mouth at bedtime. 09/15/14   Junie SpencerHawks, Christy A, FNP    Family History Family History  Problem Relation Age of Onset  . Cancer Father     Social  History Social History  Substance Use Topics  . Smoking status: Current Every Day Smoker  . Smokeless tobacco: Never Used  . Alcohol use No     Allergies   Codeine and Prednisone   Review of Systems Review of Systems ROS reviewed and all are negative for acute change except as noted in the HPI.  Physical Exam Updated Vital Signs BP 120/66 (BP Location: Right Arm)   Pulse 83   Temp 98 F (36.7 C) (Oral)   Resp 20   SpO2 94%   Physical Exam  Constitutional: She is oriented to person, place, and time. She appears well-developed and well-nourished. No distress.  HENT:  Head: Normocephalic and atraumatic.  Right Ear: Tympanic membrane, external ear and ear canal normal.  Left Ear: Tympanic membrane, external ear and ear canal normal.  Nose: Nose normal.  Mouth/Throat: Uvula is midline, oropharynx is clear and moist and mucous membranes are normal. No trismus in the jaw. No oropharyngeal exudate, posterior oropharyngeal erythema or tonsillar abscesses.  Eyes: EOM are normal. Pupils are equal, round, and reactive to light.  Neck: Normal range of motion. Neck supple. No tracheal deviation present.  Cardiovascular: Normal rate, regular rhythm, S1 normal, S2 normal, normal heart sounds, intact distal pulses and normal pulses.   Pulmonary/Chest: Effort normal. No respiratory distress. She has no decreased breath sounds. She has wheezes in the right upper field, the right lower  field, the left upper field and the left lower field. She has rhonchi in the right upper field, the right lower field and the left upper field. She has no rales.  Abdominal: Normal appearance and bowel sounds are normal. There is no tenderness.  Musculoskeletal: Normal range of motion.  Neurological: She is alert and oriented to person, place, and time.  Skin: Skin is warm and dry.  Psychiatric: She has a normal mood and affect. Her speech is normal and behavior is normal. Thought content normal.   ED  Treatments / Results  Labs (all labs ordered are listed, but only abnormal results are displayed) Labs Reviewed  CBC - Abnormal; Notable for the following:       Result Value   WBC 13.0 (*)    All other components within normal limits  CULTURE, BLOOD (ROUTINE X 2)  CULTURE, BLOOD (ROUTINE X 2)  BASIC METABOLIC PANEL  TROPONIN I  D-DIMER, QUANTITATIVE (NOT AT [facility])    EKG  EKG Interpretation  Date/Time:  Thursday June 18 2017 11:41:51 EDT Ventricular Rate:  76 PR Interval:    QRS Duration: 94 QT Interval:  391 QTC Calculation: 440 R Axis:   80 Text Interpretation:  Sinus rhythm No significant change since last tracing Confirmed by Doug Sou [phone]) on 06/18/2017 1:15:38 PM       Radiology No results found.  Procedures Procedures (including critical care time)  Medications Ordered in ED Medications  enoxaparin (LOVENOX) injection 40 mg (not administered)  acetaminophen (TYLENOL) tablet 650 mg (not administered)    Or  acetaminophen (TYLENOL) suppository 650 mg (not administered)  albuterol (PROVENTIL) (2.5 MG/3ML) 0.083% nebulizer solution 2.5 mg (not administered)  albuterol (PROVENTIL,VENTOLIN) solution continuous neb (10 mg/hr Nebulization Given 06/18/17 1132)  cefTRIAXone (ROCEPHIN) 1 g in dextrose 5 % 50 mL IVPB (0 g Intravenous Stopped 06/18/17 1209)  azithromycin (ZITHROMAX) 500 mg in dextrose 5 % 250 mL IVPB (0 mg Intravenous Stopped 06/18/17 1308)     Initial Impression / Assessment and Plan / ED Course  I have reviewed the triage vital signs and the nursing notes.  Pertinent labs & imaging results that were available during my care of the patient were reviewed by me and considered in my medical decision making (see chart for details).  Final Clinical Impressions(s) / ED Diagnoses  {I have reviewed and evaluated the relevant laboratory values. {I have reviewed and evaluated the relevant imaging studies.  {I have reviewed the relevant previous healthcare  records.  {I obtained HPI from historian.   ED Course:  Assessment: CXR from [facility] shows evidence of right middle lobe PNA. Pt afebrile, no tachycardia. Normotensive. Pt was sent due to Hypoxia around 88%. Given albuterol treatment continuous in ED with O2 sat 91% on RA at rest. Continued wheeze bilaterally. Ambulated patient with O2 saturation in low 80s. CBC with leukocytosis 13. Trop negative. EKG unremarkable. D Dimer negative. Given Rocephin/Azithro in ED for CAP. Will admit to medicine  Disposition/Plan:  Admit Pt acknowledges and agrees with plan  Supervising Physician Doug Sou, MD  Final diagnoses:  Community acquired pneumonia of right middle lobe of lung [facility])    New Prescriptions New Prescriptions   No medications on file       Audry Pili, Cordelia Poche 06/18/17 1531    Doug Sou, MD 06/18/17 1544

## 2017-06-18 NOTE — ED Provider Notes (Signed)
Complains of shortness of breath onset a week ago reports minimal cough. Shortness of breath is worse with exertion improved with rest. On exam she is in no respiratory distress. Lungs with diffuse scant dry crackles   Doug SouJacubowitz, Emari Hreha, MD 06/18/17 1332

## 2017-06-18 NOTE — Progress Notes (Signed)
Pt arrived from ED via stretcher accompanied by EMT. A&Ox4, on 02 at 2L via [place]. R AC IV NSL. Skin warm, dry and intact. Pt was oriented to room, unit and staff. Call light and telephone within reach, will continue to monitor.

## 2017-06-19 ENCOUNTER — Encounter (HOSPITAL_COMMUNITY): Payer: Self-pay | Admitting: General Practice

## 2017-06-19 ENCOUNTER — Observation Stay (HOSPITAL_COMMUNITY): Payer: BLUE CROSS/BLUE SHIELD

## 2017-06-19 DIAGNOSIS — I700 Atherosclerosis of aorta: Secondary | ICD-10-CM | POA: Diagnosis present

## 2017-06-19 DIAGNOSIS — J9601 Acute respiratory failure with hypoxia: Secondary | ICD-10-CM | POA: Diagnosis present

## 2017-06-19 DIAGNOSIS — Z79899 Other long term (current) drug therapy: Secondary | ICD-10-CM | POA: Diagnosis not present

## 2017-06-19 DIAGNOSIS — J9691 Respiratory failure, unspecified with hypoxia: Secondary | ICD-10-CM | POA: Diagnosis not present

## 2017-06-19 DIAGNOSIS — E785 Hyperlipidemia, unspecified: Secondary | ICD-10-CM | POA: Diagnosis present

## 2017-06-19 DIAGNOSIS — Z7951 Long term (current) use of inhaled steroids: Secondary | ICD-10-CM | POA: Diagnosis not present

## 2017-06-19 DIAGNOSIS — J181 Lobar pneumonia, unspecified organism: Secondary | ICD-10-CM | POA: Diagnosis not present

## 2017-06-19 DIAGNOSIS — I2510 Atherosclerotic heart disease of native coronary artery without angina pectoris: Secondary | ICD-10-CM | POA: Diagnosis present

## 2017-06-19 DIAGNOSIS — I10 Essential (primary) hypertension: Secondary | ICD-10-CM | POA: Diagnosis present

## 2017-06-19 DIAGNOSIS — J440 Chronic obstructive pulmonary disease with acute lower respiratory infection: Secondary | ICD-10-CM | POA: Diagnosis present

## 2017-06-19 DIAGNOSIS — K219 Gastro-esophageal reflux disease without esophagitis: Secondary | ICD-10-CM | POA: Diagnosis present

## 2017-06-19 DIAGNOSIS — J8410 Pulmonary fibrosis, unspecified: Secondary | ICD-10-CM | POA: Diagnosis present

## 2017-06-19 DIAGNOSIS — J309 Allergic rhinitis, unspecified: Secondary | ICD-10-CM | POA: Diagnosis present

## 2017-06-19 DIAGNOSIS — J189 Pneumonia, unspecified organism: Secondary | ICD-10-CM | POA: Diagnosis present

## 2017-06-19 DIAGNOSIS — Z888 Allergy status to other drugs, medicaments and biological substances status: Secondary | ICD-10-CM | POA: Diagnosis not present

## 2017-06-19 DIAGNOSIS — F17210 Nicotine dependence, cigarettes, uncomplicated: Secondary | ICD-10-CM | POA: Diagnosis present

## 2017-06-19 LAB — CBC
HEMATOCRIT: 39.6 % (ref 36.0–46.0)
HEMOGLOBIN: 12.3 g/dL (ref 12.0–15.0)
MCH: 28.7 pg (ref 26.0–34.0)
MCHC: 31.1 g/dL (ref 30.0–36.0)
MCV: 92.3 fL (ref 78.0–100.0)
Platelets: 305 10*3/uL (ref 150–400)
RBC: 4.29 MIL/uL (ref 3.87–5.11)
RDW: 14.1 % (ref 11.5–15.5)
WBC: 10.1 10*3/uL (ref 4.0–10.5)

## 2017-06-19 LAB — HIV ANTIBODY (ROUTINE TESTING W REFLEX): HIV Screen 4th Generation wRfx: NONREACTIVE

## 2017-06-19 MED ORDER — IPRATROPIUM-ALBUTEROL 0.5-2.5 (3) MG/3ML IN SOLN
3.0000 mL | Freq: Four times a day (QID) | RESPIRATORY_TRACT | Status: DC
  Administered 2017-06-19 – 2017-06-20 (×4): 3 mL via RESPIRATORY_TRACT
  Filled 2017-06-19 (×3): qty 3

## 2017-06-19 NOTE — Progress Notes (Signed)
   Subjective: Seen and evaluated today at bedside. Feels only a little better and not near baseline respiratory status. No fevers or chills. Not coughing anything up.   Objective:  Vital signs in last 24 hours: Vitals:   06/19/17 0527 06/19/17 0746 06/19/17 0922 06/19/17 1050  BP: (!) 111/55  (!) 111/59   Pulse: 80  91   Resp: 19  18   Temp: 99.3 F (37.4 C)  98.3 F (36.8 C)   TempSrc: Oral  Oral   SpO2: 95% 95% 91% 90%  Weight:      Height:       General: In no acute distress. Resting comfortably in bed on [place]. HENT: PERRL. EOMI. No conjunctival injection, icterus or ptosis. Improved erythema and drainage of posterior pharynx, mucous membranes moist.  Cardiovascular: Regular rate and rhythm. No murmur or rub appreciated. Pulmonary: Diffuse rales with wheezes, improved from yesterday but still apparent. Normal WOB on 2L via [place].  Abdomen: Soft, non-tender and non-distended. +bowel sounds.  Skin: Warm, dry. No cyanosiss.   Psych: Mood normal and affect was mood congruent. Responds to questions appropriately.   Assessment/Plan:  Principal Problem:   Respiratory failure with hypoxia (HCC) Active Problems:   Essential hypertension, benign   CAP (community acquired pneumonia)  #Acute Respiratory Failure with Hypoxia  #RML CAP #COPD, ?Pulmonary Fibrosis Still awaiting 2-view CXR for today. She has been afebrile and without leukocytosis. Still dyspneic and on O2 however improved a little.  -FU 2-view chest  -Continue IV Ceftriaxone and Azithromycin -Continue scheduled duonebs, increase to QID -Consider PO steroids should resp status not improve with above treatment -Continue supplemental O2 -Ambulate daily if able -FU Bcx  #Essential HTN #HLD BP controlled currently on HCTZ 25 mg daily. Continue to monitor.  -Continue HCTZ 25mg  -Continue Simvastatin 40mg   #Chronic Allergies Continue flonase  #Tobacco abuse Again encouraged cessation.   Dispo: Anticipated discharge  in approximately 2 day(s).   Tonantzin Mimnaugh, DO 06/19/2017, 11:13 AM Pager: [phone]

## 2017-06-20 MED ORDER — GUAIFENESIN ER 600 MG PO TB12
1200.0000 mg | ORAL_TABLET | Freq: Two times a day (BID) | ORAL | Status: DC
  Administered 2017-06-20 – 2017-06-22 (×5): 1200 mg via ORAL
  Filled 2017-06-20 (×5): qty 2

## 2017-06-20 MED ORDER — AZITHROMYCIN 500 MG PO TABS
250.0000 mg | ORAL_TABLET | Freq: Every day | ORAL | Status: AC
  Administered 2017-06-21 – 2017-06-22 (×2): 250 mg via ORAL
  Filled 2017-06-20 (×2): qty 1

## 2017-06-20 MED ORDER — IPRATROPIUM-ALBUTEROL 0.5-2.5 (3) MG/3ML IN SOLN
3.0000 mL | Freq: Three times a day (TID) | RESPIRATORY_TRACT | Status: DC
  Administered 2017-06-20 – 2017-06-22 (×5): 3 mL via RESPIRATORY_TRACT
  Filled 2017-06-20 (×5): qty 3

## 2017-06-20 MED ORDER — IPRATROPIUM-ALBUTEROL 0.5-2.5 (3) MG/3ML IN SOLN: 3.0000 mL | Freq: Two times a day (BID) | RESPIRATORY_TRACT | Status: DC

## 2017-06-20 NOTE — Progress Notes (Signed)
SATURATION QUALIFICATIONS: (This note is used to comply with regulatory documentation for home oxygen)  Patient Saturations on Room Air at Rest =  89%  Patient Saturations on Room Air while Ambulating = 82%  Patient Saturations on  2 Liters of oxygen while Ambulating = 90%  Please briefly explain why patient needs home oxygen:  Patient's heart went up to 130's -140's per minute while ambulating.

## 2017-06-20 NOTE — Progress Notes (Signed)
   Subjective:  Patient feels about the same as yesterday. She says she had some dyspnea when ambulating a short distance yesterday. She continues to feel congested. She denies any fevers, chills, diaphoresis, or myalgias/arthralgias.  Objective:  Vital signs in last 24 hours: Vitals:   06/19/17 2045 06/19/17 2220 06/20/17 0220 06/20/17 0444  BP:  (!) 125/56  (!) 110/58  Pulse:  83  72  Resp:  (!) 83  (!) 72  Temp:  98.8 F (37.1 C)  98.9 F (37.2 C)  TempSrc:  Oral  Oral  SpO2: 93% 92% 95% 97%  Weight:  130 lb 11.2 oz (59.3 kg)    Height:       General: In no acute distress. Resting comfortably in bed on [place]. Cardiovascular: Regular rate and rhythm. No murmur or rub appreciated. Pulmonary: high pitched rhonchi with expiratory wheezing bilaterally Abdomen: Soft, non-tender and non-distended.  Skin: Warm, dry. No pedal edema. Psych: Mood normal. Responds to questions appropriately.   Assessment/Plan:  Principal Problem:   Respiratory failure with hypoxia (HCC) Active Problems:   Essential hypertension, benign   CAP (community acquired pneumonia)  #Acute Respiratory Failure with Hypoxia  #CAP #COPD, ?Pulmonary Fibrosis 2-view CXR showing changes suggestive of a multifocal pneumonia. She has been afebrile and without leukocytosis. Still dyspneic with ambulation and on O2 however improved a little.  -Continue IV Ceftriaxone and Azithromycin -Continue scheduled duonebs, increase to QID -Mucinex 1200 mg BID -Flutter valve -Consider PO steroids should resp status not improve with above treatment -Continue supplemental O2 -Ambulate daily if able; check pulse ox with amublation; desat test for home O2 -FU Bcx >> NGTD  #Essential HTN #HLD BP controlled currently on HCTZ 25 mg daily. Continue to monitor.  -Continue HCTZ 25mg  -Continue Simvastatin 40mg   #Chronic Allergies Continue flonase  #Tobacco abuse Continue to encourage cessation.   Dispo: Anticipated discharge in  approximately 1-2 day(s).   Darreld McleanPatel, Vishal, MD 06/20/2017, 7:40 AM

## 2017-06-21 ENCOUNTER — Inpatient Hospital Stay (HOSPITAL_COMMUNITY): Payer: BLUE CROSS/BLUE SHIELD

## 2017-06-21 ENCOUNTER — Encounter (HOSPITAL_COMMUNITY): Payer: Self-pay | Admitting: *Deleted

## 2017-06-21 LAB — BASIC METABOLIC PANEL
Anion gap: 8 (ref 5–15)
BUN: 11 mg/dL (ref 6–20)
CO2: 30 mmol/L (ref 22–32)
Calcium: 9.7 mg/dL (ref 8.9–10.3)
Chloride: 99 mmol/L — ABNORMAL LOW (ref 101–111)
Creatinine, Ser: 0.74 mg/dL (ref 0.44–1.00)
GFR calc Af Amer: >60 mL/min (ref >60)
GFR calc non Af Amer: >60 mL/min (ref >60)
Glucose, Bld: 102 mg/dL — ABNORMAL HIGH (ref 65–99)
Potassium: 4.6 mmol/L (ref 3.5–5.1)
Sodium: 137 mmol/L (ref 135–145)

## 2017-06-21 MED ORDER — ORAL CARE MOUTH RINSE
15.0000 mL | Freq: Two times a day (BID) | OROMUCOSAL | Status: DC
  Administered 2017-06-21 (×2): 15 mL via OROMUCOSAL

## 2017-06-21 NOTE — Progress Notes (Signed)
   Subjective:  Patient completing her breathing treatment when seen this morning. Patient feels the same as yesterday. She continues to have dyspnea when ambulating short distances. She becomes hypoxic with ambulation on room air. Breathing treatments are not providing significant relief. She continues to feel congested. She denies any fevers, chills, diaphoresis, or myalgias/arthralgias.  Objective:  Vital signs in last 24 hours: Vitals:   06/20/17 2129 06/21/17 0553 06/21/17 0913 06/21/17 0927  BP:  (!) 94/54  (!) 102/58  Pulse:  65  74  Resp:  18  16  Temp:  98.1 F (36.7 C)  98.6 F (37 C)  TempSrc:  Oral  Oral  SpO2: 93% 94% 90% 95%  Weight:      Height:       General: In no acute distress. Resting comfortably in bed on [place]. Cardiovascular: Regular rate and rhythm. No murmur or rub appreciated. Pulmonary: high pitched rhonchi with expiratory wheezing bilaterally Abdomen: Soft, non-tender and non-distended.  Skin: Warm, dry. No pedal edema.  Assessment/Plan:  Principal Problem:   Respiratory failure with hypoxia (HCC) Active Problems:   Essential hypertension, benign   CAP (community acquired pneumonia)  #Acute Respiratory Failure with Hypoxia  #COPD, ?Pulmonary Fibrosis 2-view CXR read as changes suggestive of a multifocal pneumonia. She has been afebrile and without leukocytosis. Still dyspneic with ambulation and desaturates when on room air to <88%. CT chest from 2008 reports changes suggestive of possible early fibrosis. Given that she has had minimal improvement on current therapy, we will obtain a CT chest to assess for progression of pulmonary fibrosis or other etiology of her dyspnea and hypoxia.  -CT Chest - may need steroids if imaging consistent with pulmonary fibrosis -Continue IV Ceftriaxone and Azithromycin for now -Continue scheduled duonebs -Mucinex 1200 mg BID -Flutter valve -Continue supplemental O2 (keep O2 between 88-92%) -Ambulate daily if  able -FU Bcx >> NGTD  #Essential HTN #HLD BP controlled currently on HCTZ 25 mg daily. Continue to monitor.  -Continue HCTZ 25mg  -Continue Simvastatin 40mg   #Chronic Allergies Continue flonase  #Tobacco abuse Continue to encourage cessation.   Dispo: Anticipated discharge pending clinical improvement.   Darreld McleanPatel, Sharah Finnell, MD 06/21/2017, 11:00 AM

## 2017-06-22 LAB — CBC
HCT: 38.6 % (ref 36.0–46.0)
HEMOGLOBIN: 12.7 g/dL (ref 12.0–15.0)
MCH: 30 pg (ref 26.0–34.0)
MCHC: 32.9 g/dL (ref 30.0–36.0)
MCV: 91 fL (ref 78.0–100.0)
Platelets: 374 10*3/uL (ref 150–400)
RBC: 4.24 MIL/uL (ref 3.87–5.11)
RDW: 13.4 % (ref 11.5–15.5)
WBC: 11.8 10*3/uL — ABNORMAL HIGH (ref 4.0–10.5)

## 2017-06-22 MED ORDER — PREDNISONE 20 MG PO TABS: 40.0000 mg | ORAL_TABLET | Freq: Every day | ORAL | 0 refills | Status: AC

## 2017-06-22 MED ORDER — UMECLIDINIUM-VILANTEROL 62.5-25 MCG/INH IN AEPB
1.0000 | INHALATION_SPRAY | Freq: Every day | RESPIRATORY_TRACT | Status: DC
  Filled 2017-06-22: qty 14

## 2017-06-22 MED ORDER — UMECLIDINIUM-VILANTEROL 62.5-25 MCG/INH IN AEPB: 1.0000 | INHALATION_SPRAY | Freq: Every day | RESPIRATORY_TRACT | 0 refills | Status: DC

## 2017-06-22 MED ORDER — PREDNISONE 20 MG PO TABS: 40.0000 mg | ORAL_TABLET | Freq: Every day | ORAL | Status: DC

## 2017-06-22 NOTE — Progress Notes (Signed)
   Subjective: Seen and evaluated today at bedside. Continues to report minimal improvement since discharge however feels better than yesterday. Notes SOB with ambulation and her O2 sats drop as well. Frustrated over lack of significant improvement.   Objective:  Vital signs in last 24 hours: Vitals:   06/21/17 2134 06/22/17 0421 06/22/17 0733 06/22/17 0940  BP: (!) 99/37 (!) 132/55  (!) 105/48  Pulse: 77 81  85  Resp: 17 17  18   Temp: 97.9 F (36.6 C) 98.2 F (36.8 C)  98 F (36.7 C)  TempSrc: Oral Oral  Oral  SpO2: 93% 93% 92% 95%  Weight: 131 lb (59.4 kg)     Height:       General: In no acute distress. Resting comfortably in bed on [place]. HENT: PERRL. EOMI. No conjunctival injection, icterus or ptosis.   Cardiovascular: Regular rate and rhythm. No murmur or rub appreciated. Pulmonary: Diffuse gurgling rales with wheezes. Normal WOB on 2L via [place]. Speaks in complete sentences.  Abdomen: Soft, non-tender and non-distended. +bowel sounds.  Skin: Warm, dry. No cyanosiss.   Assessment/Plan:  Principal Problem:   Respiratory failure with hypoxia (HCC) Active Problems:   Essential hypertension, benign   CAP (community acquired pneumonia)  # Acute Respiratory Failure with Hypoxia  # CAP # COPD # Pulmonary Fibrosis Day 5 of IV Azithromycin and Ceftriaxone. I personally reviewed CT chest w/o contrast obtained yesterday which demonstrated bands of linear scarring throughout and emphysematous changes throughout. Read as ?multifocal pneumonia. She remains afebrile and does not have productive cough.  PFTs on admission reviewed, FEV1 30% of predicted. This patient was not on consistent inhaler therapy at home either.  -Would benefit from outpatient PFTs  -FU CBC -Add Prednisone 40 mg daily x5 days -Will start patient on proper LABA/LAMA therapy at discharge. Have asked CM to assist with finding affordable inhalers for this patient. She will also need a rescue albuterol inhaler as well.    -Continue supplemental O2  #Essential HTN #HLD BP controlled. -Continue HCTZ 25mg  -Continue Simvastatin 40mg   #Chronic Allergies -Continue flonase  #Tobacco abuse -Again encouraged cessation. Feels that she has kicked the habit here in the hospital.   #Aortic Atherosclerosis #Coronary Artery Calcifications Noted on CT chest. She very much denies any CP, arm numbness/tingling/pain or diaphoresis. Will need to be followed-up outpatient.   Dispo: Anticipated discharge in approximately 2 day(s).   Kino Dunsworth, DO 06/22/2017, 11:25 AM Pager: [phone]

## 2017-06-22 NOTE — Care Management Note (Addendum)
Case Management Note  Patient Details  Name: Breanna Ramirez MRN: [id number] Date of Birth: 1956/03/21  Subjective/Objective:     CM following for progression and d/c planning..                Action/Plan: 06/22/2017 Noted consult for CM re home oxygen and cost of medication. This CM notified [facility] of pt oxygen needs , qualifying sats, diagnosis and plan to d/c. Request in process for pt copay for medications being considered for discharge. Will request orders for [facility] services.  Copay cost to pt reported to MD, oxygen to be delivered to pt room.  Expected Discharge Date:     06/22/2017             Expected Discharge Plan:  Home w Home Health Services  In-House Referral:  NA  Discharge planning Services  CM Consult  Post Acute Care Choice:  Durable Medical Equipment, Home Health Choice offered to:  Patient  DME Arranged:  Oxygen DME Agency:  [facility]  [facility]   [facility]     Status of Service:  Complete  If discussed at Long Length of Stay Meetings, dates discussed:    Additional Comments:  Breanna Ramirez, Breanna Ramirez U, RN 06/22/2017, 2:21 PM

## 2017-06-22 NOTE — Progress Notes (Signed)
SATURATION QUALIFICATIONS: (This note is used to comply with regulatory documentation for home oxygen)  Patient Saturations on Room Air at Rest = 88%  Patient Saturations on Room Air while Ambulating = 80%  Patient Saturations on  2 Liters of oxygen while Ambulating =92 %  Please briefly explain why patient needs home oxygen: Patient is immidiately desaturated upon getting up from the bed at 86% then dropped down as low as 79%80% at room air while ambulating.

## 2017-06-22 NOTE — Discharge Instructions (Signed)
PLEASE TAKE THE PRESCRIBED ANORO INHALER EVERY SINGLE DAY. Please stop smoking.  Chronic Obstructive Pulmonary Disease Chronic obstructive pulmonary disease (COPD) is a common lung condition in which airflow from the lungs is limited. COPD is a general term that can be used to describe many different lung problems that limit airflow, including both chronic bronchitis and emphysema. If you have COPD, your lung function will probably never return to normal, but there are measures you can take to improve lung function and make yourself feel better. What are the causes?  Smoking (common).  Exposure to secondhand smoke.  Genetic problems.  Chronic inflammatory lung diseases or recurrent infections. What are the signs or symptoms?  Shortness of breath, especially with physical activity.  Deep, persistent (chronic) cough with a large amount of thick mucus.  Wheezing.  Rapid breaths (tachypnea).  Gray or bluish discoloration (cyanosis) of the skin, especially in your fingers, toes, or lips.  Fatigue.  Weight loss.  Frequent infections or episodes when breathing symptoms become much worse (exacerbations).  Chest tightness. How is this diagnosed? Your health care provider will take a medical history and perform a physical examination to diagnose COPD. Additional tests for COPD may include:  Lung (pulmonary) function tests.  Chest X-ray.  CT scan.  Blood tests.  How is this treated? Treatment for COPD may include:  Inhaler and nebulizer medicines. These help manage the symptoms of COPD and make your breathing more comfortable.  Supplemental oxygen. Supplemental oxygen is only helpful if you have a low oxygen level in your blood.  Exercise and physical activity. These are beneficial for nearly all people with COPD.  Lung surgery or transplant.  Nutrition therapy to gain weight, if you are underweight.  Pulmonary rehabilitation. This may involve working with a team of  health care providers and specialists, such as respiratory, occupational, and physical therapists.  Follow these instructions at home:  Take all medicines (inhaled or pills) as directed by your health care provider.  Avoid over-the-counter medicines or cough syrups that dry up your airway (such as antihistamines) and slow down the elimination of secretions unless instructed otherwise by your health care provider.  If you are a smoker, the most important thing that you can do is stop smoking. Continuing to smoke will cause further lung damage and breathing trouble. Ask your health care provider for help with quitting smoking. He or she can direct you to community resources or hospitals that provide support.  Avoid exposure to irritants such as smoke, chemicals, and fumes that aggravate your breathing.  Use oxygen therapy and pulmonary rehabilitation if directed by your health care provider. If you require home oxygen therapy, ask your health care provider whether you should purchase a pulse oximeter to measure your oxygen level at home.  Avoid contact with individuals who have a contagious illness.  Avoid extreme temperature and humidity changes.  Eat healthy foods. Eating smaller, more frequent meals and resting before meals may help you maintain your strength.  Stay active, but balance activity with periods of rest. Exercise and physical activity will help you maintain your ability to do things you want to do.  Preventing infection and hospitalization is very important when you have COPD. Make sure to receive all the vaccines your health care provider recommends, especially the pneumococcal and influenza vaccines. Ask your health care provider whether you need a pneumonia vaccine.  Learn and use relaxation techniques to manage stress.  Learn and use controlled breathing techniques as directed by your health  care provider. Controlled breathing techniques include: 1. Pursed lip breathing.  Start by breathing in (inhaling) through your nose for 1 second. Then, purse your lips as if you were going to whistle and breathe out (exhale) through the pursed lips for 2 seconds. 2. Diaphragmatic breathing. Start by putting one hand on your abdomen just above your waist. Inhale slowly through your nose. The hand on your abdomen should move out. Then purse your lips and exhale slowly. You should be able to feel the hand on your abdomen moving in as you exhale.  Learn and use controlled coughing to clear mucus from your lungs. Controlled coughing is a series of short, progressive coughs. The steps of controlled coughing are: 1. Lean your head slightly forward. 2. Breathe in deeply using diaphragmatic breathing. 3. Try to hold your breath for 3 seconds. 4. Keep your mouth slightly open while coughing twice. 5. Spit any mucus out into a tissue. 6. Rest and repeat the steps once or twice as needed. Contact a health care provider if:  You are coughing up more mucus than usual.  There is a change in the color or thickness of your mucus.  Your breathing is more labored than usual.  Your breathing is faster than usual. Get help right away if:  You have shortness of breath while you are resting.  You have shortness of breath that prevents you from: ? Being able to talk. ? Performing your usual physical activities.  You have chest pain lasting longer than 5 minutes.  Your skin color is more cyanotic than usual.  You measure low oxygen saturations for longer than 5 minutes with a pulse oximeter. This information is not intended to replace advice given to you by your health care provider. Make sure you discuss any questions you have with your health care provider. Document Released: 09/24/2005 Document Revised: 05/22/2016 Document Reviewed: 08/11/2013 Elsevier Interactive Patient Education  2017 [employer]

## 2017-06-22 NOTE — Progress Notes (Signed)
PT Cancellation Note  Patient Details Name: Novella RobWanda P Zahradnik MRN: [id number] DOB: 23-Jun-1956   Cancelled Treatment:    Reason Eval/Treat Not Completed: Other (comment).  Nursing just completed O2 verification with pt, and pt declines any further PT.  Will advise check with pt tomorrow as she dropped to 80% on room air, to see if she does need any treatment from PT.   Ivar DrapeRuth E Asako Saliba 06/22/2017, 1:15 PM   Samul Dadauth Savannha Welle, PT MS Acute Rehab Dept. Number: [facility] [phone] and [facility] [phone]

## 2017-06-23 LAB — CULTURE, BLOOD (ROUTINE X 2)
CULTURE: NO GROWTH
Culture: NO GROWTH
SPECIAL REQUESTS: ADEQUATE
SPECIAL REQUESTS: ADEQUATE

## 2017-06-24 NOTE — Discharge Summary (Signed)
Name: Breanna Ramirez MRN: [id number] DOB: Aug 27, 1956 61 y.o. PCP: Mechele ClaudeStacks, Warren, MD  Date of Admission: 06/18/2017 11:04 AM Date of Discharge: 06/23/2017 Attending Physician: Dr.  Levert FeinsteinJames M Granfortuna, MD  Discharge Diagnosis: 1. Respiratory failure with hypoxia 2. COPD 3. Pulmonary fibrosis 4. Hypertension  Principal Problem:   Respiratory failure with hypoxia (HCC) Active Problems:   Essential hypertension, benign   CAP (community acquired pneumonia)  Discharge Medications: Allergies as of 06/22/2017      Reactions   Prednisone Palpitations   Tolerates DepoMedrol well in oral and injectable.    Codeine Nausea And Vomiting      Medication List    STOP taking these medications   BC HEADACHE POWDER PO   diclofenac 75 MG EC tablet Commonly known as:  VOLTAREN   Fluticasone-Salmeterol 250-50 MCG/DOSE Aepb Commonly known as:  ADVAIR     TAKE these medications   albuterol 108 (90 Base) MCG/ACT inhaler Commonly known as:  PROVENTIL HFA;VENTOLIN HFA Inhale 1-2 puffs into the lungs every 6 (six) hours as needed for wheezing or shortness of breath.   esomeprazole 40 MG capsule Commonly known as:  NEXIUM Take 1 capsule (40 mg total) by mouth daily.   hydrochlorothiazide 25 MG tablet Commonly known as:  HYDRODIURIL Take 1 tablet (25 mg total) by mouth daily.   predniSONE 20 MG tablet Commonly known as:  DELTASONE Take 2 tablets (40 mg total) by mouth daily with breakfast.   simvastatin 40 MG tablet Commonly known as:  ZOCOR Take 1 tablet (40 mg total) by mouth at bedtime.   umeclidinium-vilanterol 62.5-25 MCG/INH Aepb Commonly known as:  ANORO ELLIPTA Inhale 1 puff into the lungs daily.      Disposition and follow-up:   Breanna Ramirez was [facility] in Stable condition.  At the hospital follow up visit please address:  1.  COPD, pulmonary fibrosis: Ensure patient strict compliance with her prescribed triple therapy inhalers  of Spiriva and Symbicort. She would also benefit from another pulmonology referral visits been 10 years since she last saw them. Please consider obtaining PFTs and ambulating the patient to evaluate for home oxygen needs.  2.  Labs / imaging needed at time of follow-up: PFTs, ambulation to evaluate for home oxygen  3.  Pending labs/ test needing follow-up: None  Follow-up Appointments: Follow-up Information    Stacks, Broadus JohnWarren, MD. Schedule an appointment as soon as possible for a visit in 1 week(s).   Specialty:  Family Medicine Contact information: [address] [place] [address] [phone]          Hospital Course by problem list: Principal Problem:   Respiratory failure with hypoxia [facility](HCC) Active Problems:   Essential hypertension, benign   CAP (community acquired pneumonia)   #Respiratory failure with hypoxia #Pulmonary Fibrosis #COPD #Tobacco abuse 61 year old female with pulmonary fibrosis, COPD, chronic rhinitis and current tobacco abuse admitted 06/18/17 with worsening shortness of breath and nonproductive cough. She was afebrile and without significant leukocytosis. Seen at outside urgent care [facility] was concerning for RML PNA and PFTs with FEV1 30% of expected. She had desaturation with ambulation and was admitted for evaluation. She was treated with 5 days of IV ceftriaxone, azithromycin and nebulizer's. With some improvement however patient continued to have an oxygen requirement of 2 L throughout admission. Suspect progression of her chronic pulmonary fibrosis and underlying lung disease. Patient was only using Advair when necessary at home. She  was discharged home with appropriate triple therapy for COPD with Spiriva and Symbicort as well as a brief course of steroids. She is to follow-up with her primary care provider and pulmonology after discharge. Tobacco cessation was strongly emphasized during her admission.  #Essential HTN Patient was  prescribed Hydrocort thiazide 25 mg daily prior to admission. Her blood pressure was controlled throughout admission on this regimen and this was continued upon discharge  Discharge Vitals:   BP (!) 111/52 (BP Location: Right Arm)   Pulse 81   Temp 98.5 F (36.9 C) (Oral)   Resp 17   Ht 5\' 4"  (1.626 m)   Wt 131 lb (59.4 kg)   SpO2 93%   BMI 22.49 kg/m   Pertinent Labs, Studies, and Procedures:  Chest x-ray: Multifocal bilateral pulmonary infiltrates CT chest without contrast: Multiple ill-defined consolidations and fibrosis throughout HIV negative Troponin negative D-dimer negative Blood cultures negative  Signed: Katalyn Matin, DO 06/24/2017, 11:37 AM   Pager: [phone]

## 2017-11-13 ENCOUNTER — Other Ambulatory Visit: Payer: Self-pay | Admitting: Physician Assistant

## 2017-11-13 ENCOUNTER — Other Ambulatory Visit: Payer: Self-pay | Admitting: Family Medicine

## 2017-11-13 DIAGNOSIS — Z1231 Encounter for screening mammogram for malignant neoplasm of breast: Secondary | ICD-10-CM

## 2017-12-11 ENCOUNTER — Ambulatory Visit
Admission: RE | Admit: 2017-12-11 | Discharge: 2017-12-11 | Disposition: A | Discharge status: Home / Self Care | Payer: BLUE CROSS/BLUE SHIELD | Source: Ambulatory Visit | Attending: Physician Assistant | Admitting: Physician Assistant

## 2017-12-11 DIAGNOSIS — Z1231 Encounter for screening mammogram for malignant neoplasm of breast: Secondary | ICD-10-CM

## 2018-10-21 ENCOUNTER — Encounter (HOSPITAL_COMMUNITY): Payer: Self-pay | Admitting: *Deleted

## 2018-10-21 ENCOUNTER — Other Ambulatory Visit: Payer: Self-pay

## 2018-10-21 ENCOUNTER — Emergency Department (HOSPITAL_COMMUNITY): Payer: BLUE CROSS/BLUE SHIELD

## 2018-10-21 ENCOUNTER — Inpatient Hospital Stay (HOSPITAL_COMMUNITY)
Admission: EM | Admit: 2018-10-21 | Discharge: 2018-10-23 | DRG: 193 | Disposition: A | Discharge status: Home / Self Care | Payer: BLUE CROSS/BLUE SHIELD | Attending: Internal Medicine | Admitting: Internal Medicine

## 2018-10-21 DIAGNOSIS — J181 Lobar pneumonia, unspecified organism: Secondary | ICD-10-CM | POA: Diagnosis not present

## 2018-10-21 DIAGNOSIS — J189 Pneumonia, unspecified organism: Secondary | ICD-10-CM | POA: Diagnosis present

## 2018-10-21 DIAGNOSIS — K219 Gastro-esophageal reflux disease without esophagitis: Secondary | ICD-10-CM | POA: Diagnosis present

## 2018-10-21 DIAGNOSIS — D509 Iron deficiency anemia, unspecified: Secondary | ICD-10-CM | POA: Diagnosis present

## 2018-10-21 DIAGNOSIS — J441 Chronic obstructive pulmonary disease with (acute) exacerbation: Secondary | ICD-10-CM | POA: Diagnosis present

## 2018-10-21 DIAGNOSIS — Z888 Allergy status to other drugs, medicaments and biological substances status: Secondary | ICD-10-CM

## 2018-10-21 DIAGNOSIS — Z8701 Personal history of pneumonia (recurrent): Secondary | ICD-10-CM | POA: Diagnosis not present

## 2018-10-21 DIAGNOSIS — J8410 Pulmonary fibrosis, unspecified: Secondary | ICD-10-CM | POA: Diagnosis present

## 2018-10-21 DIAGNOSIS — J9601 Acute respiratory failure with hypoxia: Secondary | ICD-10-CM | POA: Diagnosis present

## 2018-10-21 DIAGNOSIS — D649 Anemia, unspecified: Secondary | ICD-10-CM

## 2018-10-21 DIAGNOSIS — Z885 Allergy status to narcotic agent status: Secondary | ICD-10-CM

## 2018-10-21 DIAGNOSIS — Z79899 Other long term (current) drug therapy: Secondary | ICD-10-CM

## 2018-10-21 DIAGNOSIS — F17210 Nicotine dependence, cigarettes, uncomplicated: Secondary | ICD-10-CM | POA: Diagnosis present

## 2018-10-21 DIAGNOSIS — J440 Chronic obstructive pulmonary disease with acute lower respiratory infection: Secondary | ICD-10-CM | POA: Diagnosis present

## 2018-10-21 DIAGNOSIS — Z23 Encounter for immunization: Secondary | ICD-10-CM | POA: Diagnosis not present

## 2018-10-21 DIAGNOSIS — Z7952 Long term (current) use of systemic steroids: Secondary | ICD-10-CM | POA: Diagnosis not present

## 2018-10-21 DIAGNOSIS — I10 Essential (primary) hypertension: Secondary | ICD-10-CM | POA: Diagnosis present

## 2018-10-21 LAB — CBC WITH DIFFERENTIAL/PLATELET
ABS IMMATURE GRANULOCYTES: 0.1 10*3/uL — AB (ref 0.00–0.07)
BASOS ABS: 0 10*3/uL (ref 0.0–0.1)
Basophils Relative: 0 %
Eosinophils Absolute: 0 10*3/uL (ref 0.0–0.5)
Eosinophils Relative: 0 %
HCT: 28.1 % — ABNORMAL LOW (ref 36.0–46.0)
HEMOGLOBIN: 7.8 g/dL — AB (ref 12.0–15.0)
IMMATURE GRANULOCYTES: 1 %
LYMPHS PCT: 2 %
Lymphs Abs: 0.3 10*3/uL — ABNORMAL LOW (ref 0.7–4.0)
MCH: 19.5 pg — ABNORMAL LOW (ref 26.0–34.0)
MCHC: 27.8 g/dL — ABNORMAL LOW (ref 30.0–36.0)
MCV: 70.3 fL — ABNORMAL LOW (ref 80.0–100.0)
Monocytes Absolute: 0.1 10*3/uL (ref 0.1–1.0)
Monocytes Relative: 1 %
NEUTROS ABS: 12.9 10*3/uL — AB (ref 1.7–7.7)
NEUTROS PCT: 96 %
NRBC: 0 % (ref 0.0–0.2)
Platelets: 356 10*3/uL (ref 150–400)
RBC: 4 MIL/uL (ref 3.87–5.11)
RDW: 19.1 % — ABNORMAL HIGH (ref 11.5–15.5)
WBC: 13.4 10*3/uL — AB (ref 4.0–10.5)

## 2018-10-21 LAB — FOLATE: FOLATE: 10.5 ng/mL (ref >5.9)

## 2018-10-21 LAB — POCT I-STAT, CHEM 8
BUN: 7 mg/dL — AB (ref 8–23)
CHLORIDE: 103 mmol/L (ref 98–111)
Calcium, Ion: 1.2 mmol/L (ref 1.15–1.40)
Creatinine, Ser: 0.7 mg/dL (ref 0.44–1.00)
Glucose, Bld: 137 mg/dL — ABNORMAL HIGH (ref 70–99)
HEMATOCRIT: 27 % — AB (ref 36.0–46.0)
Hemoglobin: 9.2 g/dL — ABNORMAL LOW (ref 12.0–15.0)
POTASSIUM: 3.3 mmol/L — AB (ref 3.5–5.1)
SODIUM: 138 mmol/L (ref 135–145)
TCO2: 25 mmol/L (ref 22–32)

## 2018-10-21 LAB — IRON AND TIBC
Iron: 13 ug/dL — ABNORMAL LOW (ref 28–170)
Saturation Ratios: 3 % — ABNORMAL LOW (ref 10.4–31.8)
TIBC: 507 ug/dL — ABNORMAL HIGH (ref 250–450)
UIBC: 494 ug/dL

## 2018-10-21 LAB — RETICULOCYTES
IMMATURE RETIC FRACT: 19.7 % — AB (ref 2.3–15.9)
RBC.: 3.81 MIL/uL — AB (ref 3.87–5.11)
RETIC CT PCT: 1.7 % (ref 0.4–3.1)
Retic Count, Absolute: 65.2 10*3/uL (ref 19.0–186.0)

## 2018-10-21 LAB — INFLUENZA PANEL BY PCR (TYPE A & B)
Influenza A By PCR: NEGATIVE
Influenza B By PCR: NEGATIVE

## 2018-10-21 LAB — OCCULT BLOOD, POC DEVICE: Fecal Occult Bld: POSITIVE — AB

## 2018-10-21 LAB — PREPARE RBC (CROSSMATCH)

## 2018-10-21 LAB — FERRITIN: FERRITIN: 6 ng/mL — AB (ref 11–307)

## 2018-10-21 LAB — VITAMIN B12: VITAMIN B 12: 594 pg/mL (ref 180–914)

## 2018-10-21 MED ORDER — POLYETHYLENE GLYCOL 3350 17 G PO PACK: 17.0000 g | PACK | Freq: Every day | ORAL | Status: DC | PRN

## 2018-10-21 MED ORDER — SODIUM CHLORIDE 0.9 % IV SOLN
1.0000 g | Freq: Once | INTRAVENOUS | Status: AC
  Administered 2018-10-21: 1 g via INTRAVENOUS
  Filled 2018-10-21: qty 10

## 2018-10-21 MED ORDER — ALBUTEROL SULFATE (2.5 MG/3ML) 0.083% IN NEBU: 2.5000 mg | INHALATION_SOLUTION | RESPIRATORY_TRACT | Status: DC | PRN

## 2018-10-21 MED ORDER — SIMVASTATIN 40 MG PO TABS
40.0000 mg | ORAL_TABLET | Freq: Every day | ORAL | Status: DC
  Administered 2018-10-21: 40 mg via ORAL
  Filled 2018-10-21 (×2): qty 1

## 2018-10-21 MED ORDER — ONDANSETRON HCL 4 MG/2ML IJ SOLN: 4.0000 mg | Freq: Four times a day (QID) | INTRAMUSCULAR | Status: DC | PRN

## 2018-10-21 MED ORDER — SODIUM CHLORIDE 0.9 % IV SOLN
1.0000 g | INTRAVENOUS | Status: DC
  Administered 2018-10-22: 1 g via INTRAVENOUS
  Filled 2018-10-21: qty 1
  Filled 2018-10-21: qty 10

## 2018-10-21 MED ORDER — ALBUTEROL SULFATE HFA 108 (90 BASE) MCG/ACT IN AERS: 1.0000 | INHALATION_SPRAY | Freq: Four times a day (QID) | RESPIRATORY_TRACT | Status: DC | PRN

## 2018-10-21 MED ORDER — SODIUM CHLORIDE 0.9% IV SOLUTION
Freq: Once | INTRAVENOUS | Status: AC
  Administered 2018-10-21: 23:00:00 via INTRAVENOUS

## 2018-10-21 MED ORDER — IPRATROPIUM-ALBUTEROL 0.5-2.5 (3) MG/3ML IN SOLN
3.0000 mL | Freq: Once | RESPIRATORY_TRACT | Status: AC
  Administered 2018-10-21: 3 mL via RESPIRATORY_TRACT
  Filled 2018-10-21: qty 3

## 2018-10-21 MED ORDER — ACETAMINOPHEN 325 MG PO TABS: 650.0000 mg | ORAL_TABLET | Freq: Four times a day (QID) | ORAL | Status: DC | PRN

## 2018-10-21 MED ORDER — METHYLPREDNISOLONE SODIUM SUCC 40 MG IJ SOLR
40.0000 mg | Freq: Two times a day (BID) | INTRAMUSCULAR | Status: DC
  Administered 2018-10-21 – 2018-10-22 (×3): 40 mg via INTRAVENOUS
  Filled 2018-10-21 (×4): qty 1

## 2018-10-21 MED ORDER — SODIUM CHLORIDE 0.9 % IV SOLN
INTRAVENOUS | Status: AC
  Administered 2018-10-21: 21:00:00 via INTRAVENOUS

## 2018-10-21 MED ORDER — HYDROCHLOROTHIAZIDE 25 MG PO TABS
25.0000 mg | ORAL_TABLET | Freq: Every day | ORAL | Status: DC
  Administered 2018-10-22 – 2018-10-23 (×2): 25 mg via ORAL
  Filled 2018-10-21 (×2): qty 1

## 2018-10-21 MED ORDER — ACETAMINOPHEN 650 MG RE SUPP: 650.0000 mg | Freq: Four times a day (QID) | RECTAL | Status: DC | PRN

## 2018-10-21 MED ORDER — UMECLIDINIUM-VILANTEROL 62.5-25 MCG/INH IN AEPB
1.0000 | INHALATION_SPRAY | Freq: Every day | RESPIRATORY_TRACT | Status: DC
  Administered 2018-10-22 – 2018-10-23 (×2): 1 via RESPIRATORY_TRACT
  Filled 2018-10-21: qty 14

## 2018-10-21 MED ORDER — AZITHROMYCIN 250 MG PO TABS
500.0000 mg | ORAL_TABLET | ORAL | Status: DC
  Administered 2018-10-22 – 2018-10-23 (×2): 500 mg via ORAL
  Filled 2018-10-21 (×2): qty 2

## 2018-10-21 MED ORDER — ONDANSETRON HCL 4 MG PO TABS: 4.0000 mg | ORAL_TABLET | Freq: Four times a day (QID) | ORAL | Status: DC | PRN

## 2018-10-21 MED ORDER — PANTOPRAZOLE SODIUM 40 MG PO TBEC
40.0000 mg | DELAYED_RELEASE_TABLET | Freq: Two times a day (BID) | ORAL | Status: DC
  Administered 2018-10-21 – 2018-10-23 (×3): 40 mg via ORAL
  Filled 2018-10-21 (×4): qty 1

## 2018-10-21 MED ORDER — PNEUMOCOCCAL VAC POLYVALENT 25 MCG/0.5ML IJ INJ
0.5000 mL | INJECTION | INTRAMUSCULAR | Status: AC
  Administered 2018-10-22: 0.5 mL via INTRAMUSCULAR
  Filled 2018-10-21: qty 0.5

## 2018-10-21 MED ORDER — SODIUM CHLORIDE 0.9 % IV SOLN
500.0000 mg | Freq: Once | INTRAVENOUS | Status: AC
  Administered 2018-10-21: 500 mg via INTRAVENOUS
  Filled 2018-10-21: qty 500

## 2018-10-21 NOTE — ED Triage Notes (Signed)
Per EMS, pt from [facility] medical center for possible pneumonia. Pt complains of SOB and cough x 1 week. Pt was given 125 solumedrol at [facility]. Pt was given duoneb by EMS. Pt has hx of COPD. Pt was initially in the upper 70s on room air at clinic, was placed on 3L and improved to low 90s. Pt denies fever.

## 2018-10-21 NOTE — ED Notes (Signed)
Phone report given to [place], Charity fundraiser.

## 2018-10-21 NOTE — Progress Notes (Signed)
Received report from Irving Burton, ED RN, and pt is pending trasport.

## 2018-10-21 NOTE — ED Provider Notes (Signed)
[facility]-EMERGENCY DEPT Provider Note   CSN: [id number] Arrival date & time: 10/21/18  1223     History   Chief Complaint Chief Complaint  Patient presents with  . Shortness of Breath    HPI Breanna Ramirez is a 62 y.o. female.  The history is provided by the patient and medical records. No language interpreter was used.  Shortness of Breath      62 year old female with history of COPD, hypertension, recurrent pneumonia, tobacco abuse sent here via EMS from [facility].  Patient report for the past week she has had cough, wheezing, shortness of breath with exertion, and subjective fever.  Symptom has been persistent.  No significant headache, runny nose sneezing sore throat abdominal pain dysuria, nausea vomiting or diarrhea.  She does complain of some pain to her left posterior lung which is waxing and waning.  No specific treatment tried.  She mentioned been seen by her PCP today and had a chest x-ray with potential pneumonia.  Patient was sent to the ED when she was found to be hypoxic with O2 sat in the upper 70s on room air.  She was given solumedrol and DuoNeb prior to arrival.  Past Medical History:  Diagnosis Date  . COPD (chronic obstructive pulmonary disease) (HCC)   . Essential hypertension   . GERD (gastroesophageal reflux disease)   . Pneumonia 05/2017  . Respiratory failure with hypoxia (HCC) 05/2017    Patient Active Problem List   Diagnosis Date Noted  . CAP (community acquired pneumonia) 06/18/2017  . Respiratory failure with hypoxia (HCC) 06/18/2017  . Essential hypertension, benign 09/13/2014  . CAFL (chronic airflow limitation) (HCC) 04/26/2012  . SINUSITIS, CHRONIC 12/14/2007  . BRONCHITIS 12/14/2007  . PULMONARY FIBROSIS, POSTINFLAMMATORY 12/14/2007  . G E R D 12/14/2007    Past Surgical History:  Procedure Laterality Date  . ENDOMETRIAL ABLATION       OB History   None      Home Medications    Prior to  Admission medications   Medication Sig Start Date End Date Taking? Authorizing Provider  albuterol (PROVENTIL HFA;VENTOLIN HFA) 108 (90 Base) MCG/ACT inhaler Inhale 1-2 puffs into the lungs every 6 (six) hours as needed for wheezing or shortness of breath.    [provider]  amoxicillin-clavulanate (AUGMENTIN) 875-125 MG tablet Take 1 tablet by mouth 2 (two) times daily. 10/18/18   [provider]  azithromycin (ZITHROMAX) 250 MG tablet Take 250-500 mg by mouth See admin instructions. 10/21/18   [provider]  esomeprazole (NEXIUM) 40 MG capsule Take 1 capsule (40 mg total) by mouth daily. 09/13/14   Jannifer Rodney A, FNP  hydrochlorothiazide (HYDRODIURIL) 25 MG tablet Take 1 tablet (25 mg total) by mouth daily. 09/13/14   Jannifer Rodney A, FNP  predniSONE (STERAPRED UNI-PAK 21 TAB) 10 MG (21) TBPK tablet Take 10-60 mg by mouth See admin instructions. 10/21/18   [provider]  simvastatin (ZOCOR) 40 MG tablet Take 1 tablet (40 mg total) by mouth at bedtime. 09/15/14   Hawks, Neysa Bonito A, FNP  umeclidinium-vilanterol (ANORO ELLIPTA) 62.5-25 MCG/INH AEPB Inhale 1 puff into the lungs daily. 06/23/17   Charlsie Quest, MD    Family History Family History  Problem Relation Age of Onset  . Cancer Father     Social History Social History   Tobacco Use  . Smoking status: Current Every Day Smoker    Packs/day: 0.50    Years: 40.00  Pack years: 20.00  . Smokeless tobacco: Never Used  Substance Use Topics  . Alcohol use: No  . Drug use: No     Allergies   Prednisone and Codeine   Review of Systems Review of Systems  Respiratory: Positive for shortness of breath.   All other systems reviewed and are negative.    Physical Exam Updated Vital Signs BP 122/60 (BP Location: Left Arm)   Pulse 93   Temp 97.8 F (36.6 C) (Oral)   Resp 12   SpO2 94%   Physical Exam  Constitutional: She appears well-developed and well-nourished. No distress.    Patient laying in bed comfortably, wearing supplemental oxygen, in no acute distress.  HENT:  Head: Atraumatic.  Eyes: Conjunctivae are normal.  Neck: Neck supple.  Cardiovascular: Normal rate and regular rhythm.  Pulmonary/Chest: Effort normal. She has decreased breath sounds. She has wheezes. She has rhonchi. She has rales.  Abdominal: Soft. There is no tenderness.  Genitourinary:  Genitourinary Comments: Chaperone present during exam.  No obvious rectal mass, normal rectal tone, normal color stool on glove.  Musculoskeletal:       Right lower leg: She exhibits no edema.       Left lower leg: She exhibits no edema.  Neurological: She is alert.  Skin: No rash noted.  Psychiatric: She has a normal mood and affect.  Nursing note and vitals reviewed.    ED Treatments / Results  Labs (all labs ordered are listed, but only abnormal results are displayed) Labs Reviewed  CBC WITH DIFFERENTIAL/PLATELET - Abnormal; Notable for the following components:      Result Value   WBC 13.4 (*)    Hemoglobin 7.8 (*)    HCT 28.1 (*)    MCV 70.3 (*)    MCH 19.5 (*)    MCHC 27.8 (*)    RDW 19.1 (*)    Neutro Abs 12.9 (*)    Lymphs Abs 0.3 (*)    Abs Immature Granulocytes 0.10 (*)    All other components within normal limits  POCT I-STAT, CHEM 8 - Abnormal; Notable for the following components:   Potassium 3.3 (*)    BUN 7 (*)    Glucose, Bld 137 (*)    Hemoglobin 9.2 (*)    HCT 27.0 (*)    All other components within normal limits  OCCULT BLOOD, POC DEVICE - Abnormal; Notable for the following components:   Fecal Occult Bld POSITIVE (*)    All other components within normal limits  VITAMIN B12  FOLATE  IRON AND TIBC  FERRITIN  RETICULOCYTES  I-STAT CHEM 8, ED  POC OCCULT BLOOD, ED    EKG EKG Interpretation  Date/Time:  Thursday October 21 2018 12:33:55 EDT Ventricular Rate:  84 PR Interval:    QRS Duration: 97 QT Interval:  373 QTC Calculation: 441 R Axis:   81 Text  Interpretation:  Sinus rhythm Borderline right axis deviation since last tracing no significant change Confirmed by Mancel Bale [phone]) on 10/21/2018 3:34:38 PM   Radiology Dg Chest 2 View  Result Date: 10/21/2018 CLINICAL DATA:  Shortness of breath, cough. EXAM: CHEST - 2 VIEW COMPARISON:  Radiographs of June 19, 2017. FINDINGS: The heart size and mediastinal contours are within normal limits. Atherosclerosis of thoracic aorta is noted. Left lung is clear. Mild right middle lobe opacity is noted which may represent atelectasis, infiltrate or possibly scarring. No pneumothorax or pleural effusion is noted. The visualized skeletal structures are unremarkable. IMPRESSION: Mild right  middle lobe opacity is noted which may represent subsegmental atelectasis, infiltrate or possibly scarring. Aortic Atherosclerosis (ICD10-I70.0). Electronically Signed   By: Lupita Raider, M.D.   On: 10/21/2018 13:37    Procedures Procedures (including critical care time)  Medications Ordered in ED Medications  azithromycin (ZITHROMAX) 500 mg in sodium chloride 0.9 % 250 mL IVPB (has no administration in time range)  ipratropium-albuterol (DUONEB) 0.5-2.5 (3) MG/3ML nebulizer solution 3 mL (3 mLs Nebulization Given 10/21/18 1505)  cefTRIAXone (ROCEPHIN) 1 g in sodium chloride 0.9 % 100 mL IVPB (1 g Intravenous New Bag/Given 10/21/18 1519)     Initial Impression / Assessment and Plan / ED Course  I have reviewed the triage vital signs and the nursing notes.  Pertinent labs & imaging results that were available during my care of the patient were reviewed by me and considered in my medical decision making (see chart for details).     BP 122/60 (BP Location: Left Arm)   Pulse 93   Temp 97.8 F (36.6 C) (Oral)   Resp 12   SpO2 94%    Final Clinical Impressions(s) / ED Diagnoses   Final diagnoses:  Community acquired pneumonia, unspecified laterality  Anemia, unspecified type  COPD exacerbation [facility])     ED Discharge Orders    None     2:37 PM Patient with pulmonary disease here with persistent nonproductive cough for the past week and a chest x-ray concerning for pneumonia.  She was found to be hypoxic with upper 70s on room air at her facility and now currently on 3 L of nasal cannula supplemental oxygen, satting at 94%'s and more comfortable.  On exam, she has wheezes, rhonchi throughout.  She did receive prednisone, and initial DuoNeb.  Will provide additional breathing treatment.  No recent hospitalization therefore patient will be treated for community acquired pneumonia with Rocephin and Zithromax.  Anticipate admission for pneumonia with new oxygen requirement. Doubt PE.  Care discussed with Dr. Effie Shy  3:49 PM Patient is anemic with hemoglobin of 7.8.  There has been a steady drops of hemoglobin as evidenced by her prior hemoglobin value.  Will obtain Hemoccult.  5:48 PM Normal stool color on glove, Hemoccult positive.  Appreciate consultation from tried hospitalist, Dr. David Stall who agrees to admit patient for further management.   Fayrene Helper, PA-C 10/21/18 1749    Mancel Bale, MD 10/22/18 [phone]

## 2018-10-21 NOTE — H&P (Signed)
History and Physical    Breanna Ramirez ZOX:[id number] DOB: [birth year] DOA: 10/21/2018  PCP: Mechele Claude, MD  Patient coming from: home  Chief Complaint: Cough and SOB  HPI: Breanna Ramirez is a 62 y.o. female with medical history significant of past medical history of COPD pulmonary fibrosis, who recently saw her primary care doctor 4 days prior to admission was started her on antibiotics with no improvement who comes into the ED for cough and shortness of breath that started about 5 days prior to admission, she denies any fever chills, her cough has been nonproductive nonproductive, she denies any sore throat, dysuria nausea, vomiting or diarrhea.  She went back to the PCP today and that a chest x-ray shows shows potential pneumonia was sent to the ED as she was found to be hypoxic in the physician's office.  She denies any black stools or hematochezia no hematemesis.  ED Course:  She has remained afebrile, with mild leukocytosis with a left shift, she was also found to have a hemoglobin of 7.8 her previous hemoglobin was 12 that was last year.  Review of Systems: As per HPI otherwise 10 point review of systems negative.    Past Medical History:  Diagnosis Date  . COPD (chronic obstructive pulmonary disease) (HCC)   . Essential hypertension   . GERD (gastroesophageal reflux disease)   . Pneumonia 05/2017  . Respiratory failure with hypoxia (HCC) 05/2017    Past Surgical History:  Procedure Laterality Date  . ENDOMETRIAL ABLATION       reports that she has been smoking. She has a 20.00 pack-year smoking history. She has never used smokeless tobacco. She reports that she does not drink alcohol or use drugs.  Allergies  Allergen Reactions  . Prednisone Palpitations    Tolerates DepoMedrol well in oral and injectable.   . Codeine Nausea And Vomiting    Family History  Problem Relation Age of Onset  . Cancer Father      Prior to Admission medications   Medication Sig Start  Date End Date Taking? Authorizing Provider  albuterol (PROVENTIL HFA;VENTOLIN HFA) 108 (90 Base) MCG/ACT inhaler Inhale 1-2 puffs into the lungs every 6 (six) hours as needed for wheezing or shortness of breath.   Yes [provider]  amoxicillin-clavulanate (AUGMENTIN) 875-125 MG tablet Take 1 tablet by mouth 2 (two) times daily. 10/18/18  Yes [provider]  aspirin 325 MG tablet Take 325 mg by mouth daily as needed for mild pain.   Yes [provider]  esomeprazole (NEXIUM) 40 MG capsule Take 1 capsule (40 mg total) by mouth daily. 09/13/14  Yes Hawks, Christy A, FNP  hydrochlorothiazide (HYDRODIURIL) 25 MG tablet Take 1 tablet (25 mg total) by mouth daily. 09/13/14  Yes Hawks, Christy A, FNP  ibuprofen (ADVIL,MOTRIN) 200 MG tablet Take 400 mg by mouth every 6 (six) hours as needed for mild pain.   Yes [provider]  simvastatin (ZOCOR) 40 MG tablet Take 1 tablet (40 mg total) by mouth at bedtime. 09/15/14  Yes Hawks, Christy A, FNP  azithromycin (ZITHROMAX) 250 MG tablet Take 250-500 mg by mouth See admin instructions. Take 500 mg by mouth on the first day then take 250 mg by mouth daily for the next 4 days 10/21/18   [provider]  predniSONE (STERAPRED UNI-PAK 21 TAB) 10 MG (21) TBPK tablet Take 10-60 mg by mouth See admin instructions. Taper as directed on pack - Take 60 mg by mouth on day  one, 50 mg on day two, 40 mg on day three, 30 mg on day four, 20 mg on day five and 10 mg on day six 10/21/18   [provider]  umeclidinium-vilanterol (ANORO ELLIPTA) 62.5-25 MCG/INH AEPB Inhale 1 puff into the lungs daily. Patient not taking: Reported on 10/21/2018 06/23/17   Charlsie Quest, MD    Physical Exam: Vitals:   10/21/18 1630 10/21/18 1700 10/21/18 1715 10/21/18 1730  BP: 130/67 120/62  129/69  Pulse: 89 (!) 102 100 96  Resp:  13 12 15   Temp:      TempSrc:      SpO2: 94% 97% 96% 96%    Constitutional: NAD, calm, comfortable,  cachectic. Vitals:   10/21/18 1630 10/21/18 1700 10/21/18 1715 10/21/18 1730  BP: 130/67 120/62  129/69  Pulse: 89 (!) 102 100 96  Resp:  13 12 15   Temp:      TempSrc:      SpO2: 94% 97% 96% 96%   Eyes: PERRL, lids and conjunctivae normal ENMT: Mucous membranes are moist. Posterior pharynx clear of any exudate or lesions.Normal dentition.  Neck: normal, supple, no masses, no thyromegaly Respiratory: She has poor air movement with wheezing bilaterally and crackles at the right upper lung Cardiovascular: Regular rate and rhythm, no murmurs / rubs / gallops. No extremity edema. 2+ pedal pulses. No carotid bruits.  Abdomen: no tenderness, no masses palpated. No hepatosplenomegaly. Bowel sounds positive.  Musculoskeletal: no clubbing / cyanosis. No joint deformity upper and lower extremities. Good ROM, no contractures. Normal muscle tone.  Skin: no rashes, lesions, ulcers. No induration Neurologic: CN 2-12 grossly intact. Sensation intact, DTR normal. Strength 5/5 in all 4.  Psychiatric: Normal judgment and insight. Alert and oriented x 3. Normal mood.    Labs on Admission: I have personally reviewed following labs and imaging studies  CBC: Recent Labs  Lab 10/21/18 1517 10/21/18 1527  WBC  --  13.4*  NEUTROABS  --  12.9*  HGB 9.2* 7.8*  HCT 27.0* 28.1*  MCV  --  70.3*  PLT  --  356   Basic Metabolic Panel: Recent Labs  Lab 10/21/18 1517  NA 138  K 3.3*  CL 103  GLUCOSE 137*  BUN 7*  CREATININE 0.70   GFR: CrCl cannot be calculated (Unknown ideal weight.). Liver Function Tests: No results for input(s): AST, ALT, ALKPHOS, BILITOT, PROT, ALBUMIN in the last 168 hours. No results for input(s): LIPASE, AMYLASE in the last 168 hours. No results for input(s): AMMONIA in the last 168 hours. Coagulation Profile: No results for input(s): INR, PROTIME in the last 168 hours. Cardiac Enzymes: No results for input(s): CKTOTAL, CKMB, CKMBINDEX, TROPONINI in the last 168  hours. BNP (last 3 results) No results for input(s): PROBNP in the last 8760 hours. HbA1C: No results for input(s): HGBA1C in the last 72 hours. CBG: No results for input(s): GLUCAP in the last 168 hours. Lipid Profile: No results for input(s): CHOL, HDL, LDLCALC, TRIG, CHOLHDL, LDLDIRECT in the last 72 hours. Thyroid Function Tests: No results for input(s): TSH, T4TOTAL, FREET4, T3FREE, THYROIDAB in the last 72 hours. Anemia Panel: No results for input(s): VITAMINB12, FOLATE, FERRITIN, TIBC, IRON, RETICCTPCT in the last 72 hours. Urine analysis: No results found for: COLORURINE, APPEARANCEUR, LABSPEC, PHURINE, GLUCOSEU, HGBUR, BILIRUBINUR, KETONESUR, PROTEINUR, UROBILINOGEN, NITRITE, LEUKOCYTESUR  Radiological Exams on Admission: Dg Chest 2 View  Result Date: 10/21/2018 CLINICAL DATA:  Shortness of breath, cough. EXAM: CHEST - 2 VIEW COMPARISON:  Radiographs  of June 19, 2017. FINDINGS: The heart size and mediastinal contours are within normal limits. Atherosclerosis of thoracic aorta is noted. Left lung is clear. Mild right middle lobe opacity is noted which may represent atelectasis, infiltrate or possibly scarring. No pneumothorax or pleural effusion is noted. The visualized skeletal structures are unremarkable. IMPRESSION: Mild right middle lobe opacity is noted which may represent subsegmental atelectasis, infiltrate or possibly scarring. Aortic Atherosclerosis (ICD10-I70.0). Electronically Signed   By: Lupita Raider, M.D.   On: 10/21/2018 13:37    EKG: Independently reviewed.  Normal sinus rhythm normal axis nonspecific T wave changes normal intervals.  Assessment/Plan Acute respiratory failure with hypoxia (HCC) due to CAP (community acquired pneumonia) and probable COPD exacerbation: In a history patient with history of pulmonary fibrosis, recently treated with Augmentin and doxycycline with no improvement that comes in for productive cough, leukocytosis and dyspnea especially on  exertion. We will admit her to MedSurg, start her on IV fluids, steroids and inhalers. Start Rocephin and azithromycin.  Get sputum culture and HIV and influenza PCR (low likelihood).    Microcytic anemia: Get anemia panel transfuse 1 unit of packed red blood cells recheck a CBC tomorrow morning. Her last colonoscopy was about 10 years ago with Dr. Loreta Ave she will need a repeat a colonoscopy as an outpatient.  Essential hypertension: Hold hydrochlorthiazide for today can be resumed in the morning.   DVT prophylaxis: SCD Code Status: full Family Communication: none Disposition Plan: Home in 2-3 days  Consults called: none Admission status: inpatient  It is my clinical opinion that admission to INPATIENT is reasonable and necessary in this 62 y.o. female history of pulmonary fibrosis and COPD comes in complaining of symptoms of acute hypoxic respiratory failure in the setting of pneumonia with low hemoglobin but denies any melanotic stools or hematochezia, with poor air movement on physical exam in the setting into the 70s on room air with a chest x-ray positive for pneumonia and a mild leukocytosis with a left shift on a CBC.  Given the aforementioned, the predictability of an adverse outcome is felt to be significant. I expect that the patient will require at least 2 midnights in the hospital to treat this condition.  Marinda Elk MD [facility] Pager [phone]  If 7PM-7AM, please contact night-coverage [email or web address] Password [facility]  10/21/2018, 6:04 PM

## 2018-10-21 NOTE — ED Provider Notes (Signed)
  Face-to-face evaluation   History: She presents for evaluation of known hypoxia.  She went to see her PCP today because of cough and trouble breathing for about a week.  She is an ex-smoker.  She denies chest pain.  Physical exam: Elderly appearing female who is uncomfortable. Lungs- Decreased air mvt, scattered rhonchi.   Medical screening examination/treatment/procedure(s) were conducted as a shared visit with non-physician practitioner(s) and myself.  I personally evaluated the patient during the encounter    Mancel Bale, MD 10/22/18 1011

## 2018-10-21 NOTE — ED Notes (Signed)
Transport contacted

## 2018-10-21 NOTE — ED Notes (Signed)
ED TO INPATIENT HANDOFF REPORT  Name/Age/Gender Derl Barrow Loflin 62 y.o. female  Code Status Code Status History    Date Active Date Inactive Code Status Order ID Comments User Context   06/18/2017 1517 06/22/2017 2146 Full Code [id number]  Darreld Mclean, MD ED      Home/SNF/Other Home  Chief Complaint short of breath   Level of Care/Admitting Diagnosis ED Disposition    ED Disposition Condition Comment   Admit  Hospital Area: [facility] [[id number]]  Level of Care: Med-Surg [16]  Diagnosis: Acute respiratory failure with hypoxia [facility]) [[id number]]  Admitting Physician: Marinda Elk [[id number]]  Attending Physician: Marinda Elk [[id number]]  Estimated length of stay: past midnight tomorrow  Certification:: I certify this patient will need inpatient services for at least 2 midnights  PT Class (Do Not Modify): Inpatient [101]  PT Acc Code (Do Not Modify): Private [1]       Medical History Past Medical History:  Diagnosis Date  . COPD (chronic obstructive pulmonary disease) (HCC)   . Essential hypertension   . GERD (gastroesophageal reflux disease)   . Pneumonia 05/2017  . Respiratory failure with hypoxia (HCC) 05/2017    Allergies Allergies  Allergen Reactions  . Prednisone Palpitations    Tolerates DepoMedrol well in oral and injectable.   . Codeine Nausea And Vomiting    IV Location/Drains/Wounds Patient Lines/Drains/Airways Status   Active Line/Drains/Airways    Name:   Placement date:   Placement time:   Site:   Days:   Peripheral IV 10/21/18 Right Forearm   10/21/18    1500    Forearm   less than 1          Labs/Imaging Results for orders placed or performed during the hospital encounter of 10/21/18 (from the past 48 hour(s))  I-STAT, chem 8     Status: Abnormal   Collection Time: 10/21/18  3:17 PM  Result Value Ref Range   Sodium 138 135 - 145 mmol/L   Potassium 3.3 (L) 3.5 - 5.1 mmol/L   Chloride 103 98 - 111 mmol/L   BUN 7 (L) 8  - 23 mg/dL   Creatinine, Ser 0.34 0.44 - 1.00 mg/dL   Glucose, Bld 742 (H) 70 - 99 mg/dL   Calcium, Ion 5.95 1.15 - 1.40 mmol/L   TCO2 25 22 - 32 mmol/L   Hemoglobin 9.2 (L) 12.0 - 15.0 g/dL   HCT 63.8 (L) 75.6 - 43.3 %  CBC with Differential/Platelet     Status: Abnormal   Collection Time: 10/21/18  3:27 PM  Result Value Ref Range   WBC 13.4 (H) 4.0 - 10.5 K/uL   RBC 4.00 3.87 - 5.11 MIL/uL   Hemoglobin 7.8 (L) 12.0 - 15.0 g/dL    Comment: Reticulocyte Hemoglobin testing may be clinically indicated, consider ordering this additional test [id number]    HCT 28.1 (L) 36.0 - 46.0 %   MCV 70.3 (L) 80.0 - 100.0 fL   MCH 19.5 (L) 26.0 - 34.0 pg   MCHC 27.8 (L) 30.0 - 36.0 g/dL   RDW 66.0 (H) 63.0 - 16.0 %   Platelets 356 150 - 400 K/uL   nRBC 0.0 0.0 - 0.2 %   Neutrophils Relative % 96 %   Neutro Abs 12.9 (H) 1.7 - 7.7 K/uL   Lymphocytes Relative 2 %   Lymphs Abs 0.3 (L) 0.7 - 4.0 K/uL   Monocytes Relative 1 %   Monocytes Absolute 0.1 0.1 - 1.0 K/uL  Eosinophils Relative 0 %   Eosinophils Absolute 0.0 0.0 - 0.5 K/uL   Basophils Relative 0 %   Basophils Absolute 0.0 0.0 - 0.1 K/uL   Immature Granulocytes 1 %   Abs Immature Granulocytes 0.10 (H) 0.00 - 0.07 K/uL    Comment: Performed at [facility], [address] [place], [place] [address]  Occult blood, poc device     Status: Abnormal   Collection Time: 10/21/18  5:37 PM  Result Value Ref Range   Fecal Occult Bld POSITIVE (A) NEGATIVE   Dg Chest 2 View  Result Date: 10/21/2018 CLINICAL DATA:  Shortness of breath, cough. EXAM: CHEST - 2 VIEW COMPARISON:  Radiographs of June 19, 2017. FINDINGS: The heart size and mediastinal contours are within normal limits. Atherosclerosis of thoracic aorta is noted. Left lung is clear. Mild right middle lobe opacity is noted which may represent atelectasis, infiltrate or possibly scarring. No pneumothorax or pleural effusion is noted. The visualized skeletal structures are  unremarkable. IMPRESSION: Mild right middle lobe opacity is noted which may represent subsegmental atelectasis, infiltrate or possibly scarring. Aortic Atherosclerosis (ICD10-I70.0). Electronically Signed   By: Lupita Raider, M.D.   On: 10/21/2018 13:37   EKG Interpretation  Date/Time:  Thursday October 21 2018 12:33:55 EDT Ventricular Rate:  84 PR Interval:    QRS Duration: 97 QT Interval:  373 QTC Calculation: 441 R Axis:   81 Text Interpretation:  Sinus rhythm Borderline right axis deviation since last tracing no significant change Confirmed by Mancel Bale [id number]) on 10/21/2018 3:34:38 PM   Pending Labs Unresulted Labs (From admission, onward)    Start     Ordered   10/21/18 1752  Influenza panel by PCR (type A & B)  Once,   R     10/21/18 1751   10/21/18 1747  Vitamin B12  (Anemia Panel (PNL))  Once,   R     10/21/18 1746   10/21/18 1747  Folate  (Anemia Panel (PNL))  Once,   R     10/21/18 1746   10/21/18 1747  Iron and TIBC  (Anemia Panel (PNL))  Once,   R     10/21/18 1746   10/21/18 1747  Ferritin  (Anemia Panel (PNL))  Once,   R     10/21/18 1746   10/21/18 1747  Reticulocytes  (Anemia Panel (PNL))  Once,   R     10/21/18 1746   Signed and Held  HIV antibody (Routine Testing)  Once,   R     Signed and Held   Signed and Held  HIV antibody (Routine Screening)  Once,   R     Signed and Held   Signed and Held  Culture, blood (routine x 2) Call MD if unable to obtain prior to antibiotics being given  BLOOD CULTURE X 2,   R    Comments:  If blood cultures drawn in Emergency Department - Do not draw and cancel order    Signed and Held   Signed and Held  Culture, sputum-assessment  Once,   R     Signed and Held   Signed and Held  Gram stain  Once,   R     Signed and Held   Signed and Held  Strep pneumoniae urinary antigen  Once,   R     Signed and Held   Medical illustrator and Held  CBC  Tomorrow morning,   R     Signed and Held  Signed and Held  Basic metabolic panel   Tomorrow morning,   R     Signed and Held   Signed and Held  Influenza panel by PCR (type A & B)  (Influenza PCR Panel)  Once,   R     Signed and Held   Signed and Held  Type and screen [facility]  Once,   R    Comments:  [facility]    Signed and Held   Signed and Held  Prepare RBC  (Adult Blood Administration - Red Blood Cells)  Once,   R    Question Answer Comment  # of Units 1 unit   Transfusion Indications Symptomatic Anemia   If emergent release call blood bank Not emergent release      Signed and Held          Vitals/Pain Today's Vitals   10/21/18 1630 10/21/18 1700 10/21/18 1715 10/21/18 1730  BP: 130/67 120/62  129/69  Pulse: 89 (!) 102 100 96  Resp:  13 12 15   Temp:      TempSrc:      SpO2: 94% 97% 96% 96%  PainSc:        Isolation Precautions No active isolations  Medications Medications  ipratropium-albuterol (DUONEB) 0.5-2.5 (3) MG/3ML nebulizer solution 3 mL (3 mLs Nebulization Given 10/21/18 1505)  cefTRIAXone (ROCEPHIN) 1 g in sodium chloride 0.9 % 100 mL IVPB ( Intravenous Stopped 10/21/18 1613)  azithromycin (ZITHROMAX) 500 mg in sodium chloride 0.9 % 250 mL IVPB (500 mg Intravenous New Bag/Given 10/21/18 1632)    Mobility walks

## 2018-10-22 LAB — BASIC METABOLIC PANEL
Anion gap: 8 (ref 5–15)
BUN: 13 mg/dL (ref 8–23)
CALCIUM: 9.3 mg/dL (ref 8.9–10.3)
CHLORIDE: 106 mmol/L (ref 98–111)
CO2: 26 mmol/L (ref 22–32)
Creatinine, Ser: 0.73 mg/dL (ref 0.44–1.00)
GFR calc Af Amer: >60 mL/min (ref >60)
Glucose, Bld: 140 mg/dL — ABNORMAL HIGH (ref 70–99)
Potassium: 4 mmol/L (ref 3.5–5.1)
SODIUM: 140 mmol/L (ref 135–145)

## 2018-10-22 LAB — CBC
HCT: 31.8 % — ABNORMAL LOW (ref 36.0–46.0)
HEMOGLOBIN: 9 g/dL — AB (ref 12.0–15.0)
MCH: 20.6 pg — AB (ref 26.0–34.0)
MCHC: 28.3 g/dL — AB (ref 30.0–36.0)
MCV: 72.9 fL — ABNORMAL LOW (ref 80.0–100.0)
Platelets: 344 10*3/uL (ref 150–400)
RBC: 4.36 MIL/uL (ref 3.87–5.11)
RDW: 20.5 % — ABNORMAL HIGH (ref 11.5–15.5)
WBC: 9.1 10*3/uL (ref 4.0–10.5)
nRBC: 0 % (ref 0.0–0.2)

## 2018-10-22 LAB — TYPE AND SCREEN
ABO/RH(D): A POS
ANTIBODY SCREEN: NEGATIVE
Unit division: 0

## 2018-10-22 LAB — ABO/RH: ABO/RH(D): A POS

## 2018-10-22 LAB — BPAM RBC
Blood Product Expiration Date: 201911132359
ISSUE DATE / TIME: 201910242237
Unit Type and Rh: 6200

## 2018-10-22 LAB — STREP PNEUMONIAE URINARY ANTIGEN: STREP PNEUMO URINARY ANTIGEN: NEGATIVE

## 2018-10-22 MED ORDER — SODIUM CHLORIDE 0.9 % IV SOLN
25.0000 mg | Freq: Once | INTRAVENOUS | Status: AC
  Administered 2018-10-22: 25 mg via INTRAVENOUS
  Filled 2018-10-22: qty 0.5

## 2018-10-22 MED ORDER — SODIUM CHLORIDE 0.9 % IV SOLN: INTRAVENOUS | Status: DC | PRN

## 2018-10-22 MED ORDER — SODIUM CHLORIDE 0.9 % IV SOLN
500.0000 mg | Freq: Once | INTRAVENOUS | Status: AC
  Administered 2018-10-22: 500 mg via INTRAVENOUS
  Filled 2018-10-22: qty 10

## 2018-10-22 NOTE — Progress Notes (Signed)
SATURATION QUALIFICATIONS: (This note is used to comply with regulatory documentation for home oxygen)  Patient Saturations on Room Air at Rest = 93%  Patient Saturations on Room Air while Ambulating = 77%- HR 133(ambto BR and back only)  Patient Saturations on 3 Liters of oxygen while Ambulating = 87% HR 121(150')  Please briefly explain why patient needs home oxygen:{Desaturation on RA while ambulating Blanchard Kelch PT Acute Rehabilitation Services Pager [phone] Office [phone]

## 2018-10-22 NOTE — Progress Notes (Signed)
[facility] PROGRESS NOTE    Progress Note  Breanna Ramirez  ZOX:[id number] DOB: 1956-02-09 DOA: 10/21/2018 PCP: Mechele Claude, MD     Brief Narrative:   Breanna Ramirez is an 62 y.o. female past medical history significant for COPD, pulmonary fibrosis who recently saw her primary care doctor 4 days prior to admission who started her antibiotic with no improvements, comes to the ED with cough shortness of breath chest x-ray shows a possible pneumonia she is found to be hypoxic in the ED.  Assessment/Plan:   Acute respiratory failure with hypoxia due to CAP (community acquired pneumonia) and probable COPD exacerbation: She relates she is still dyspneic with ambulation. We will continue IV Rocephin and azithromycin, IV steroids and inhalers. Influence of PCR is negative.  Has remained afebrile leukocytosis has resolved. Blood cultures are negative till date. We will ambulate patient and check saturations with ambulation. Consult physical therapy.  Microcytic anemia: Ferritin of 6 she we will give her IV iron. She is status post 1 unit of packed red blood cells. She denies any black stools or any signs of overt bleeding. Her last colonoscopy was 10 years ago. Will need a colonoscopy with her gastroenterologist as an outpatient.  Hypertension: Resume hydrochlorothiazide.  DVT prophylaxis: SCD Family Communication:none Disposition Plan/Barrier to D/C: home in am Code Status:     Code Status Orders  (From admission, onward)         Start     Ordered   10/21/18 1954  Full code  Continuous     10/21/18 1954        Code Status History    Date Active Date Inactive Code Status Order ID Comments User Context   06/18/2017 1517 06/22/2017 2146 Full Code [id number]  Darreld Mclean, MD ED        IV Access:    Peripheral IV   Procedures and diagnostic studies:   Dg Chest 2 View  Result Date: 10/21/2018 CLINICAL DATA:  Shortness of breath, cough. EXAM: CHEST - 2 VIEW  COMPARISON:  Radiographs of June 19, 2017. FINDINGS: The heart size and mediastinal contours are within normal limits. Atherosclerosis of thoracic aorta is noted. Left lung is clear. Mild right middle lobe opacity is noted which may represent atelectasis, infiltrate or possibly scarring. No pneumothorax or pleural effusion is noted. The visualized skeletal structures are unremarkable. IMPRESSION: Mild right middle lobe opacity is noted which may represent subsegmental atelectasis, infiltrate or possibly scarring. Aortic Atherosclerosis (ICD10-I70.0). Electronically Signed   By: Lupita Raider, M.D.   On: 10/21/2018 13:37     Medical Consultants:    None.  Anti-Infectives:   IV Rocephin and azithromycin.  Subjective:    Breanna Ramirez rates her breathing is improved compared to yesterday.  Objective:    Vitals:   10/21/18 2240 10/21/18 2258 10/22/18 0100 10/22/18 0602  BP: 115/65 114/60 113/60 113/76  Pulse: 77 80 78 71  Resp: 18 18 18 17   Temp: 97.8 F (36.6 C) 97.8 F (36.6 C) 98.2 F (36.8 C) 98.3 F (36.8 C)  TempSrc: Oral Oral Oral Oral  SpO2: 97% 94% 94% 100%    Intake/Output Summary (Last 24 hours) at 10/22/2018 0908 Last data filed at 10/22/2018 9562 Gross per 24 hour  Intake 2419.83 ml  Output -  Net 2419.83 ml   There were no vitals filed for this visit.  Exam: General exam: In no acute distress. Respiratory system: Improved air movement but still restricted, with wheezing and  rhonchi bilaterally.. Cardiovascular system: S1 & S2 heard, RRR.  Gastrointestinal system: Abdomen is nondistended, soft and nontender.  Central nervous system: Alert and oriented. No focal neurological deficits. Extremities: No pedal edema. Skin: No rashes, lesions or ulcers Psychiatry: Judgement and insight appear normal. Mood & affect appropriate.    Data Reviewed:    Labs: Basic Metabolic Panel: Recent Labs  Lab 10/21/18 1517 10/22/18 0616  NA 138 140  K 3.3* 4.0  CL  103 106  CO2  --  26  GLUCOSE 137* 140*  BUN 7* 13  CREATININE 0.70 0.73  CALCIUM  --  9.3   GFR CrCl cannot be calculated (Unknown ideal weight.). Liver Function Tests: No results for input(s): AST, ALT, ALKPHOS, BILITOT, PROT, ALBUMIN in the last 168 hours. No results for input(s): LIPASE, AMYLASE in the last 168 hours. No results for input(s): AMMONIA in the last 168 hours. Coagulation profile No results for input(s): INR, PROTIME in the last 168 hours.  CBC: Recent Labs  Lab 10/21/18 1517 10/21/18 1527 10/22/18 0616  WBC  --  13.4* 9.1  NEUTROABS  --  12.9*  --   HGB 9.2* 7.8* 9.0*  HCT 27.0* 28.1* 31.8*  MCV  --  70.3* 72.9*  PLT  --  356 344   Cardiac Enzymes: No results for input(s): CKTOTAL, CKMB, CKMBINDEX, TROPONINI in the last 168 hours. BNP (last 3 results) No results for input(s): PROBNP in the last 8760 hours. CBG: No results for input(s): GLUCAP in the last 168 hours. D-Dimer: No results for input(s): DDIMER in the last 72 hours. Hgb A1c: No results for input(s): HGBA1C in the last 72 hours. Lipid Profile: No results for input(s): CHOL, HDL, LDLCALC, TRIG, CHOLHDL, LDLDIRECT in the last 72 hours. Thyroid function studies: No results for input(s): TSH, T4TOTAL, T3FREE, THYROIDAB in the last 72 hours.  Invalid input(s): FREET3 Anemia work up: Recent Labs    10/21/18 2048  VITAMINB12 594  FOLATE 10.5  FERRITIN 6*  TIBC 507*  IRON 13*  RETICCTPCT 1.7   Sepsis Labs: Recent Labs  Lab 10/21/18 1527 10/22/18 0616  WBC 13.4* 9.1   Microbiology No results found for this or any previous visit (from the past 240 hour(s)).   Medications:   . azithromycin  500 mg Oral Q24H  . hydrochlorothiazide  25 mg Oral Daily  . methylPREDNISolone (SOLU-MEDROL) injection  40 mg Intravenous Q12H  . pantoprazole  40 mg Oral BID  . pneumococcal 23 valent vaccine  0.5 mL Intramuscular Tomorrow-1000  . simvastatin  40 mg Oral QHS  . umeclidinium-vilanterol  1  puff Inhalation Daily   Continuous Infusions: . cefTRIAXone (ROCEPHIN)  IV    . iron dextran (INFED/DEXFERRUM) infusion      LOS: 1 day   Marinda Elk  [facility] Pager [phone]  *Please refer to [email or web address], password [email or web address] to get updated schedule on who will round on this patient, as hospitalists switch teams weekly. If 7PM-7AM, please contact night-coverage at [email or web address], password [email or web address] for any overnight needs.  10/22/2018, 9:08 AM

## 2018-10-22 NOTE — Evaluation (Signed)
Physical Therapy Evaluation Patient Details Name: Breanna Ramirez MRN: [id number] DOB: Apr 25, 1956 Today's Date: 10/22/2018   History of Present Illness  Breanna Ramirez is an 62 y.o. female past medical history significant for COPD, pulmonary fibrosis  admitted 10/21/18 with   cough shortness of breath ,chest x-ray shows a possible pneumonia  ,found to be hypoxic in the ED.  Clinical Impression  Patient's  Oxygen saturation dropped to 77% on RA and HR 133 to ambulate to  the bathroom and back to bed.Ambulation on 3  liters, saturation dropped to 87%, HR 121. RN aware. Patient  Is able to mobilize without assistance,. PT signing off.    Follow Up Recommendations No PT follow up(Pulmonary rehab)    Equipment Recommendations  None recommended by PT    Recommendations for Other Services       Precautions / Restrictions Precautions Precaution Comments: monitor O2 and HR      Mobility  Bed Mobility Overal bed mobility: Independent                Transfers                    Ambulation/Gait Ambulation/Gait assistance: Independent Gait Distance (Feet): 150 Feet Assistive device: None Gait Pattern/deviations: WFL(Within Functional Limits)        Stairs            Wheelchair Mobility    Modified Rankin (Stroke Patients Only)       Balance                                             Pertinent Vitals/Pain Pain Assessment: No/denies pain    Home Living Family/patient expects to be discharged to:: Private residence Living Arrangements: Alone Available Help at Discharge: Family;Available PRN/intermittently Type of Home: Apartment       Home Layout: One level Home Equipment: None      Prior Function Level of Independence: Independent               Hand Dominance        Extremity/Trunk Assessment   Upper Extremity Assessment Upper Extremity Assessment: Overall WFL for tasks assessed    Lower Extremity  Assessment Lower Extremity Assessment: Overall WFL for tasks assessed    Cervical / Trunk Assessment Cervical / Trunk Assessment: Normal  Communication   Communication: No difficulties  Cognition Arousal/Alertness: Awake/alert Behavior During Therapy: WFL for tasks assessed/performed Overall Cognitive Status: Within Functional Limits for tasks assessed                                        General Comments      Exercises     Assessment/Plan    PT Assessment Patent does not need any further PT services(Pulmonary Rehab OP)  PT Problem List         PT Treatment Interventions      PT Goals (Current goals can be found in the Care Plan section)  Acute Rehab PT Goals Patient Stated Goal: get better, breathe PT Goal Formulation: All assessment and education complete, DC therapy    Frequency     Barriers to discharge        Co-evaluation  AM-PAC PT "6 Clicks" Daily Activity  Outcome Measure Difficulty turning over in bed (including adjusting bedclothes, sheets and blankets)?: None Difficulty moving from lying on back to sitting on the side of the bed? : None Difficulty sitting down on and standing up from a chair with arms (e.g., wheelchair, bedside commode, etc,.)?: None Help needed moving to and from a bed to chair (including a wheelchair)?: None Help needed walking in hospital room?: None Help needed climbing 3-5 steps with a railing? : None 6 Click Score: 24    End of Session   Activity Tolerance: Patient tolerated treatment well Patient left: in chair;with call bell/phone within reach Nurse Communication: Mobility status PT Visit Diagnosis: Other (comment)(hypoxia)    Time: 6045-4098 PT Time Calculation (min) (ACUTE ONLY): 24 min   Charges:   PT Evaluation $PT Eval Low Complexity: 1 Low PT Treatments $Gait Training: 8-22 mins        Blanchard Kelch PT Acute Rehabilitation Services Pager [phone] Office  [phone]   Rada Hay 10/22/2018, 12:37 PM

## 2018-10-23 LAB — HIV ANTIBODY (ROUTINE TESTING W REFLEX): HIV Screen 4th Generation wRfx: NONREACTIVE

## 2018-10-23 MED ORDER — AMOXICILLIN-POT CLAVULANATE 875-125 MG PO TABS: 1.0000 | ORAL_TABLET | Freq: Two times a day (BID) | ORAL | 0 refills | Status: DC

## 2018-10-23 MED ORDER — PREDNISONE 10 MG PO TABS: ORAL_TABLET | ORAL | 0 refills | Status: DC

## 2018-10-23 MED ORDER — AZITHROMYCIN 250 MG PO TABS: ORAL_TABLET | ORAL | 0 refills | Status: DC

## 2018-10-23 MED ORDER — FERROUS SULFATE 325 (65 FE) MG PO TABS: 325.0000 mg | ORAL_TABLET | Freq: Every day | ORAL | 3 refills | Status: DC

## 2018-10-23 NOTE — Progress Notes (Signed)
SATURATION QUALIFICATIONS: (This note is used to comply with regulatory documentation for home oxygen)  Patient Saturations on Room Air at Rest = 84%  Patient Saturations on Room Air while Ambulating = 86%  Patient Saturations on 3 Liters of oxygen while Ambulating = 86%  Please briefly explain why patient needs home oxygen: Self evident. Sats below 90!!

## 2018-10-23 NOTE — Discharge Summary (Signed)
Physician Discharge Summary  Breanna Ramirez:[id number] DOB: 19-Sep-1956 DOA: 10/21/2018  PCP: Mechele Claude, MD  Admit date: 10/21/2018 Discharge date: 10/23/2018  Admitted From: home Disposition:  Home  Recommendations for Outpatient Follow-up:  1. Follow up with PCP in 1-2 weeks 2. Please obtain BMP/CBC in one week 3. She will go home with on 2 L of oxygen. 4. Follow-up with GI as an outpatient and she will need a colonoscopy as an outpatient.  Home Health:no Equipment/Devices:none  Discharge Condition:stable CODE STATUS:full Diet recommendation: Heart Healthy   Brief/Interim Summary:  62 y.o. female past medical history significant for COPD, pulmonary fibrosis who recently saw her primary care doctor 4 days prior to admission who started her antibiotic with no improvements, comes to the ED with cough shortness of breath chest x-ray shows a possible pneumonia she is found to be hypoxic in the ED.  Discharge Diagnoses:  Active Problems:   CAP (community acquired pneumonia)   Acute respiratory failure with hypoxia (HCC)   Microcytic anemia   COPD with acute exacerbation (HCC) Acute respiratory failure with hypoxia due to community-acquired pneumonia and COPD exacerbation: She was admitted to the hospital plans on IV steroids inhalers and antibiotics.  Next day by the next day she was much improved. She was walked and her saturations dropped below 88%. She was changed to oral Cipro and Augmentin which she will continue as an outpatient. She also continue steroid taper as an outpatient.  Microcytic anemia: With ferritin of 6 she was given IV iron she was transfused 1 unit of packed red blood cells her last colonoscopy was 10 years ago. She will follow-up with gastroenterology in 1 to 2 weeks.  Essential hypertension: No changes were made to her medication.   Discharge Instructions   Allergies as of 10/23/2018      Reactions   Prednisone Palpitations   Tolerates  DepoMedrol well in oral and injectable.    Codeine Nausea And Vomiting      Medication List    STOP taking these medications   ibuprofen 200 MG tablet Commonly known as:  ADVIL,MOTRIN   predniSONE 10 MG (21) Tbpk tablet Commonly known as:  STERAPRED UNI-PAK 21 TAB Replaced by:  predniSONE 10 MG tablet     TAKE these medications   albuterol 108 (90 Base) MCG/ACT inhaler Commonly known as:  PROVENTIL HFA;VENTOLIN HFA Inhale 1-2 puffs into the lungs every 6 (six) hours as needed for wheezing or shortness of breath.   amoxicillin-clavulanate 875-125 MG tablet Commonly known as:  AUGMENTIN Take 1 tablet by mouth 2 (two) times daily.   aspirin 325 MG tablet Take 325 mg by mouth daily as needed for mild pain.   azithromycin 250 MG tablet Commonly known as:  ZITHROMAX Take 2 tab daily What changed:    how much to take  how to take this  when to take this  additional instructions   esomeprazole 40 MG capsule Commonly known as:  NEXIUM Take 1 capsule (40 mg total) by mouth daily.   ferrous sulfate 325 (65 FE) MG tablet Take 1 tablet (325 mg total) by mouth daily.   hydrochlorothiazide 25 MG tablet Commonly known as:  HYDRODIURIL Take 1 tablet (25 mg total) by mouth daily.   predniSONE 10 MG tablet Commonly known as:  DELTASONE Takes 6 tablets for 1 days, then 5 tablets for 1 days, then 4 tablets for 1 days, then 3 tablets for 1 days, then 2 tabs for 1 days, then 1 tab  for 1 days, and then stop. Replaces:  predniSONE 10 MG (21) Tbpk tablet   simvastatin 40 MG tablet Commonly known as:  ZOCOR Take 1 tablet (40 mg total) by mouth at bedtime.   umeclidinium-vilanterol 62.5-25 MCG/INH Aepb Commonly known as:  ANORO ELLIPTA Inhale 1 puff into the lungs daily.            Durable Medical Equipment  (From admission, onward)         Start     Ordered   10/23/18 0925  For home use only DME oxygen  Once    Question Answer Comment  Mode or (Route) Nasal cannula    Liters per Minute 2   Frequency Continuous (stationary and portable oxygen unit needed)   Oxygen delivery system Gas      10/23/18 0925          Allergies  Allergen Reactions  . Prednisone Palpitations    Tolerates DepoMedrol well in oral and injectable.   . Codeine Nausea And Vomiting    Consultations:  None   Procedures/Studies: Dg Chest 2 View  Result Date: 10/21/2018 CLINICAL DATA:  Shortness of breath, cough. EXAM: CHEST - 2 VIEW COMPARISON:  Radiographs of June 19, 2017. FINDINGS: The heart size and mediastinal contours are within normal limits. Atherosclerosis of thoracic aorta is noted. Left lung is clear. Mild right middle lobe opacity is noted which may represent atelectasis, infiltrate or possibly scarring. No pneumothorax or pleural effusion is noted. The visualized skeletal structures are unremarkable. IMPRESSION: Mild right middle lobe opacity is noted which may represent subsegmental atelectasis, infiltrate or possibly scarring. Aortic Atherosclerosis (ICD10-I70.0). Electronically Signed   By: Lupita Raider, M.D.   On: 10/21/2018 13:37    Subjective: She relates she feels much better no new complaints.  Discharge Exam: Vitals:   10/22/18 2148 10/23/18 0455  BP: 119/63 120/69  Pulse: 68 77  Resp: 18 20  Temp: 99.2 F (37.3 C) 98.5 F (36.9 C)  SpO2: 92% 92%   Vitals:   10/22/18 0602 10/22/18 1337 10/22/18 2148 10/23/18 0455  BP: 113/76 107/63 119/63 120/69  Pulse: 71 85 68 77  Resp: 17 12 18 20   Temp: 98.3 F (36.8 C) 97.9 F (36.6 C) 99.2 F (37.3 C) 98.5 F (36.9 C)  TempSrc: Oral Oral Oral Oral  SpO2: 100% 92% 92% 92%    General: Pt is alert, awake, not in acute distress Cardiovascular: RRR, S1/S2 +, no rubs, no gallops Respiratory: CTA bilaterally, no wheezing, no rhonchi Abdominal: Soft, NT, ND, bowel sounds + Extremities: no edema, no cyanosis    The results of significant diagnostics from this hospitalization (including  imaging, microbiology, ancillary and laboratory) are listed below for reference.     Microbiology: No results found for this or any previous visit (from the past 240 hour(s)).   Labs: BNP (last 3 results) No results for input(s): BNP in the last 8760 hours. Basic Metabolic Panel: Recent Labs  Lab 10/21/18 1517 10/22/18 0616  NA 138 140  K 3.3* 4.0  CL 103 106  CO2  --  26  GLUCOSE 137* 140*  BUN 7* 13  CREATININE 0.70 0.73  CALCIUM  --  9.3   Liver Function Tests: No results for input(s): AST, ALT, ALKPHOS, BILITOT, PROT, ALBUMIN in the last 168 hours. No results for input(s): LIPASE, AMYLASE in the last 168 hours. No results for input(s): AMMONIA in the last 168 hours. CBC: Recent Labs  Lab 10/21/18 1517 10/21/18 1527  10/22/18 0616  WBC  --  13.4* 9.1  NEUTROABS  --  12.9*  --   HGB 9.2* 7.8* 9.0*  HCT 27.0* 28.1* 31.8*  MCV  --  70.3* 72.9*  PLT  --  356 344   Cardiac Enzymes: No results for input(s): CKTOTAL, CKMB, CKMBINDEX, TROPONINI in the last 168 hours. BNP: Invalid input(s): POCBNP CBG: No results for input(s): GLUCAP in the last 168 hours. D-Dimer No results for input(s): DDIMER in the last 72 hours. Hgb A1c No results for input(s): HGBA1C in the last 72 hours. Lipid Profile No results for input(s): CHOL, HDL, LDLCALC, TRIG, CHOLHDL, LDLDIRECT in the last 72 hours. Thyroid function studies No results for input(s): TSH, T4TOTAL, T3FREE, THYROIDAB in the last 72 hours.  Invalid input(s): FREET3 Anemia work up Recent Labs    10/21/18 2048  VITAMINB12 594  FOLATE 10.5  FERRITIN 6*  TIBC 507*  IRON 13*  RETICCTPCT 1.7   Urinalysis No results found for: COLORURINE, APPEARANCEUR, LABSPEC, PHURINE, GLUCOSEU, HGBUR, BILIRUBINUR, KETONESUR, PROTEINUR, UROBILINOGEN, NITRITE, LEUKOCYTESUR Sepsis Labs Invalid input(s): PROCALCITONIN,  WBC,  LACTICIDVEN Microbiology No results found for this or any previous visit (from the past 240  hour(s)).   Time coordinating discharge: 35 minutes  SIGNED:   Marinda Elk, MD  [facility] 10/23/2018, 9:31 AM Pager   If 7PM-7AM, please contact night-coverage [email or web address] Password [email or web address]

## 2018-10-23 NOTE — Progress Notes (Signed)
Breanna Ramirez to be D/C'd Home per MD order.  Discussed prescriptions and follow up appointments with the patient. Prescriptions given to patient, medication list explained in detail. Pt verbalized understanding.  Allergies as of 10/23/2018      Reactions   Prednisone Palpitations   Tolerates DepoMedrol well in oral and injectable.    Codeine Nausea And Vomiting      Medication List    STOP taking these medications   ibuprofen 200 MG tablet Commonly known as:  ADVIL,MOTRIN   predniSONE 10 MG (21) Tbpk tablet Commonly known as:  STERAPRED UNI-PAK 21 TAB Replaced by:  predniSONE 10 MG tablet     TAKE these medications   albuterol 108 (90 Base) MCG/ACT inhaler Commonly known as:  PROVENTIL HFA;VENTOLIN HFA Inhale 1-2 puffs into the lungs every 6 (six) hours as needed for wheezing or shortness of breath.   amoxicillin-clavulanate 875-125 MG tablet Commonly known as:  AUGMENTIN Take 1 tablet by mouth 2 (two) times daily.   aspirin 325 MG tablet Take 325 mg by mouth daily as needed for mild pain.   azithromycin 250 MG tablet Commonly known as:  ZITHROMAX Take 2 tab daily What changed:    how much to take  how to take this  when to take this  additional instructions   esomeprazole 40 MG capsule Commonly known as:  NEXIUM Take 1 capsule (40 mg total) by mouth daily.   ferrous sulfate 325 (65 FE) MG tablet Take 1 tablet (325 mg total) by mouth daily.   hydrochlorothiazide 25 MG tablet Commonly known as:  HYDRODIURIL Take 1 tablet (25 mg total) by mouth daily.   predniSONE 10 MG tablet Commonly known as:  DELTASONE Takes 6 tablets for 1 days, then 5 tablets for 1 days, then 4 tablets for 1 days, then 3 tablets for 1 days, then 2 tabs for 1 days, then 1 tab for 1 days, and then stop. Replaces:  predniSONE 10 MG (21) Tbpk tablet   simvastatin 40 MG tablet Commonly known as:  ZOCOR Take 1 tablet (40 mg total) by mouth at bedtime.   umeclidinium-vilanterol 62.5-25  MCG/INH Aepb Commonly known as:  ANORO ELLIPTA Inhale 1 puff into the lungs daily.            Durable Medical Equipment  (From admission, onward)         Start     Ordered   10/23/18 0925  For home use only DME oxygen  Once    Question Answer Comment  Mode or (Route) Nasal cannula   Liters per Minute 2   Frequency Continuous (stationary and portable oxygen unit needed)   Oxygen delivery system Gas      10/23/18 0925          Vitals:   10/23/18 1059 10/23/18 1334  BP:  (!) 133/53  Pulse:  95  Resp:  18  Temp:  98.4 F (36.9 C)  SpO2: (!) 88% 93%    Skin clean, dry and intact without evidence of skin break down, no evidence of skin tears noted. IV catheter discontinued intact. Site without signs and symptoms of complications. Dressing and pressure applied. Pt denies pain at this time. No complaints noted.  An After Visit Summary was printed and given to the patient. Patient escorted via WC, and D/C home via private auto.  Mariann Barter BSN, RN [employer] Phone [phone]

## 2018-10-23 NOTE — Care Management Note (Signed)
Case Management Note  Patient Details  Name: Breanna Ramirez MRN: [id number] Date of Birth: Jan 31, 1956  Subjective/Objective:  62 yo F admitted with acute respiratory failure with hypoxia due to community-acquired pneumonia and COPD exacerbation   Action/Plan: Received referral to assist with home O2   Expected Discharge Date:  (unknown)               Expected Discharge Plan:  Home/Self Care  In-House Referral:     Discharge planning Services  CM Consult  Post Acute Care Choice:    Choice offered to:     DME Arranged:  [facility]/meds DME Agency:  [facility]  HH Arranged:    [facility] Agency:     Status of Service:  Completed, signed off  If discussed at Long Length of Stay Meetings, dates discussed:    Additional Comments: Contacted Jermaine at [facility] for home O2 referral.  Isaias Cowman, RN 10/23/2018, 2:22 PM

## 2018-10-27 LAB — CULTURE, BLOOD (ROUTINE X 2)
Culture: NO GROWTH
Culture: NO GROWTH
SPECIAL REQUESTS: ADEQUATE
Special Requests: ADEQUATE

## 2018-11-16 ENCOUNTER — Other Ambulatory Visit: Payer: Self-pay | Admitting: Physician Assistant

## 2018-11-16 DIAGNOSIS — Z1231 Encounter for screening mammogram for malignant neoplasm of breast: Secondary | ICD-10-CM

## 2019-01-04 ENCOUNTER — Other Ambulatory Visit: Payer: Self-pay | Admitting: Family

## 2019-01-04 ENCOUNTER — Ambulatory Visit
Admission: RE | Admit: 2019-01-04 | Discharge: 2019-01-04 | Disposition: A | Discharge status: Home / Self Care | Payer: BLUE CROSS/BLUE SHIELD | Source: Ambulatory Visit | Attending: Physician Assistant | Admitting: Physician Assistant

## 2019-01-04 DIAGNOSIS — Z1231 Encounter for screening mammogram for malignant neoplasm of breast: Secondary | ICD-10-CM

## 2019-11-28 ENCOUNTER — Other Ambulatory Visit: Payer: Self-pay | Admitting: Family

## 2019-11-28 DIAGNOSIS — Z1231 Encounter for screening mammogram for malignant neoplasm of breast: Secondary | ICD-10-CM

## 2020-01-18 ENCOUNTER — Ambulatory Visit: Payer: BLUE CROSS/BLUE SHIELD

## 2020-02-22 ENCOUNTER — Other Ambulatory Visit: Payer: Self-pay

## 2020-02-22 ENCOUNTER — Ambulatory Visit
Admission: RE | Admit: 2020-02-22 | Discharge: 2020-02-22 | Disposition: A | Discharge status: Home / Self Care | Payer: BC Managed Care – PPO | Source: Ambulatory Visit | Attending: Family | Admitting: Family

## 2020-02-22 DIAGNOSIS — Z1231 Encounter for screening mammogram for malignant neoplasm of breast: Secondary | ICD-10-CM

## 2020-09-25 ENCOUNTER — Encounter (HOSPITAL_COMMUNITY): Payer: Self-pay

## 2020-09-25 ENCOUNTER — Other Ambulatory Visit: Payer: Self-pay

## 2020-09-25 ENCOUNTER — Emergency Department (HOSPITAL_COMMUNITY)
Admission: EM | Admit: 2020-09-25 | Discharge: 2020-09-25 | Disposition: A | Discharge status: Home / Self Care | Payer: BC Managed Care – PPO | Attending: Emergency Medicine | Admitting: Emergency Medicine

## 2020-09-25 ENCOUNTER — Emergency Department (HOSPITAL_COMMUNITY): Payer: BC Managed Care – PPO

## 2020-09-25 DIAGNOSIS — J069 Acute upper respiratory infection, unspecified: Secondary | ICD-10-CM | POA: Insufficient documentation

## 2020-09-25 DIAGNOSIS — Z79899 Other long term (current) drug therapy: Secondary | ICD-10-CM | POA: Diagnosis not present

## 2020-09-25 DIAGNOSIS — R059 Cough, unspecified: Secondary | ICD-10-CM

## 2020-09-25 DIAGNOSIS — R05 Cough: Secondary | ICD-10-CM | POA: Diagnosis present

## 2020-09-25 DIAGNOSIS — J449 Chronic obstructive pulmonary disease, unspecified: Secondary | ICD-10-CM | POA: Insufficient documentation

## 2020-09-25 DIAGNOSIS — Z20822 Contact with and (suspected) exposure to covid-19: Secondary | ICD-10-CM | POA: Insufficient documentation

## 2020-09-25 DIAGNOSIS — I10 Essential (primary) hypertension: Secondary | ICD-10-CM | POA: Diagnosis not present

## 2020-09-25 DIAGNOSIS — J40 Bronchitis, not specified as acute or chronic: Secondary | ICD-10-CM

## 2020-09-25 DIAGNOSIS — Z87891 Personal history of nicotine dependence: Secondary | ICD-10-CM | POA: Diagnosis not present

## 2020-09-25 LAB — RESPIRATORY PANEL BY RT PCR (FLU A&B, COVID)
Influenza A by PCR: NEGATIVE
Influenza B by PCR: NEGATIVE
SARS Coronavirus 2 by RT PCR: NEGATIVE

## 2020-09-25 MED ORDER — IPRATROPIUM BROMIDE HFA 17 MCG/ACT IN AERS
2.0000 | INHALATION_SPRAY | Freq: Once | RESPIRATORY_TRACT | Status: AC
  Administered 2020-09-25: 2 via RESPIRATORY_TRACT
  Filled 2020-09-25: qty 12.9

## 2020-09-25 MED ORDER — AZITHROMYCIN 500 MG PO TABS: 500.0000 mg | ORAL_TABLET | Freq: Every day | ORAL | 0 refills | Status: AC

## 2020-09-25 MED ORDER — ALBUTEROL SULFATE HFA 108 (90 BASE) MCG/ACT IN AERS
4.0000 | INHALATION_SPRAY | Freq: Once | RESPIRATORY_TRACT | Status: AC
  Administered 2020-09-25: 4 via RESPIRATORY_TRACT
  Filled 2020-09-25: qty 6.7

## 2020-09-25 MED ORDER — PREDNISONE 10 MG PO TABS: 40.0000 mg | ORAL_TABLET | Freq: Every day | ORAL | 0 refills | Status: AC

## 2020-09-25 MED ORDER — IPRATROPIUM-ALBUTEROL 0.5-2.5 (3) MG/3ML IN SOLN
3.0000 mL | Freq: Once | RESPIRATORY_TRACT | Status: AC
  Administered 2020-09-25: 3 mL via RESPIRATORY_TRACT
  Filled 2020-09-25: qty 3

## 2020-09-25 MED ORDER — DEXAMETHASONE SODIUM PHOSPHATE 10 MG/ML IJ SOLN
8.0000 mg | Freq: Once | INTRAMUSCULAR | Status: AC
  Administered 2020-09-25: 8 mg via INTRAMUSCULAR
  Filled 2020-09-25: qty 1

## 2020-09-25 NOTE — ED Notes (Signed)
RT called

## 2020-09-25 NOTE — ED Provider Notes (Signed)
[facility]-EMERGENCY DEPT Provider Note   CSN: [id number] Arrival date & time: 09/25/20  3845     History Chief Complaint  Patient presents with  . Cough  . Nasal Congestion    Breanna Ramirez is a 64 y.o. female with past medical history of COPD, hypertension, pulmonary fibrosis that presents the emergency department today with cough and nasal congestion for the past 7 to 8 days.  Patient has been vaccinated against Covid in March.  Denies any sick contacts.  Patient states that she does not take any medications daily for her COPD or pulmonary fibrosis.  Not on any steroids.  States that she has productive cough for the past 5 days, spitting up some Mucus. This is new.  No hemoptysis.  Denies any shortness of breath or chest pain, called her PCP today who told her to come here because her oxygen saturation was 94-96%.  States that her oxygen normally is 96%.  She states she is unsure why she was told to come here. Denies any chest pain.  Denies any sore throat, fevers, chills, night sweats, myalgias, nausea, vomiting, abdominal pain, diarrhea, nausea, calf swelling, estrogen use, recent surgery/travel, past DVT.  Patient states that she has tried her rescue albuterol inhaler without relief.  States that she feels generally fine, just is not able to get rid of her cough.  Has also tried Mucinex for this.  Denies pain anywhere.  Denies any previous COPD exacerbations, has never been hospitalized for her COPD.  Does admit to wheezing.  HPI     Past Medical History:  Diagnosis Date  . COPD (chronic obstructive pulmonary disease) (HCC)   . Essential hypertension   . GERD (gastroesophageal reflux disease)   . Pneumonia 05/2017  . Respiratory failure with hypoxia (HCC) 05/2017    Patient Active Problem List   Diagnosis Date Noted  . Acute respiratory failure with hypoxia (HCC) 10/21/2018  . Microcytic anemia 10/21/2018  . COPD with acute exacerbation (HCC) 10/21/2018    . CAP (community acquired pneumonia) 06/18/2017  . Respiratory failure with hypoxia (HCC) 06/18/2017  . Essential hypertension, benign 09/13/2014  . CAFL (chronic airflow limitation) (HCC) 04/26/2012  . SINUSITIS, CHRONIC 12/14/2007  . BRONCHITIS 12/14/2007  . PULMONARY FIBROSIS, POSTINFLAMMATORY 12/14/2007  . G E R D 12/14/2007    Past Surgical History:  Procedure Laterality Date  . ENDOMETRIAL ABLATION       OB History   No obstetric history on file.     Family History  Problem Relation Age of Onset  . Cancer Father     Social History   Tobacco Use  . Smoking status: Former Smoker    Packs/day: 0.50    Years: 40.00    Pack years: 20.00    Types: Cigarettes  . Smokeless tobacco: Never Used  Vaping Use  . Vaping Use: Never used  Substance Use Topics  . Alcohol use: No  . Drug use: No    Home Medications Prior to Admission medications   Medication Sig Start Date End Date Taking? Authorizing Provider  albuterol (PROVENTIL HFA;VENTOLIN HFA) 108 (90 Base) MCG/ACT inhaler Inhale 1-2 puffs into the lungs every 6 (six) hours as needed for wheezing or shortness of breath.    [provider]  amoxicillin-clavulanate (AUGMENTIN) 875-125 MG tablet Take 1 tablet by mouth 2 (two) times daily. 10/23/18   Marinda Elk, MD  aspirin 325 MG tablet Take 325 mg by mouth daily as needed for mild pain.  [provider]  azithromycin (ZITHROMAX) 500 MG tablet Take 1 tablet (500 mg total) by mouth daily for 5 days. 09/25/20 09/30/20  Farrel GordonPatel, Casha Estupinan, PA-C  esomeprazole (NEXIUM) 40 MG capsule Take 1 capsule (40 mg total) by mouth daily. 09/13/14   Jannifer RodneyHawks, Christy A, FNP  ferrous sulfate 325 (65 FE) MG tablet Take 1 tablet (325 mg total) by mouth daily. 10/23/18 10/23/19  Marinda ElkFeliz Ortiz, Abraham, MD  hydrochlorothiazide (HYDRODIURIL) 25 MG tablet Take 1 tablet (25 mg total) by mouth daily. 09/13/14   Junie SpencerHawks, Christy A, FNP  predniSONE (DELTASONE) 10 MG tablet Take 4  tablets (40 mg total) by mouth daily for 5 days. 09/25/20 09/30/20  Farrel GordonPatel, Chonda Baney, PA-C  simvastatin (ZOCOR) 40 MG tablet Take 1 tablet (40 mg total) by mouth at bedtime. 09/15/14   Hawks, Neysa Bonitohristy A, FNP  umeclidinium-vilanterol (ANORO ELLIPTA) 62.5-25 MCG/INH AEPB Inhale 1 puff into the lungs daily. Patient not taking: Reported on 10/21/2018 06/23/17   Charlsie QuestPatel, Vishal R, MD    Allergies    Prednisone and Codeine  Review of Systems   Review of Systems  Constitutional: Negative for diaphoresis, fatigue and fever.  HENT: Positive for congestion. Negative for facial swelling, hearing loss, nosebleeds, rhinorrhea, sinus pressure, sinus pain, sneezing, sore throat, tinnitus and trouble swallowing.   Eyes: Negative for visual disturbance.  Respiratory: Positive for cough and wheezing. Negative for apnea, choking, chest tightness, shortness of breath and stridor.   Cardiovascular: Negative for chest pain, palpitations and leg swelling.  Gastrointestinal: Negative for abdominal pain, diarrhea, nausea and vomiting.  Musculoskeletal: Negative for back pain and myalgias.  Skin: Negative for color change, pallor, rash and wound.  Neurological: Negative for syncope, weakness, light-headedness, numbness and headaches.  Psychiatric/Behavioral: Negative for behavioral problems and confusion.    Physical Exam Updated Vital Signs BP 110/80 (BP Location: Right Arm)   Pulse 80   Temp 98 F (36.7 C) (Oral)   Resp 16   Ht 5' 4.5" (1.638 m)   Wt 62.6 kg   SpO2 99%   BMI 23.32 kg/m   Physical Exam Constitutional:      General: She is not in acute distress.    Appearance: Normal appearance. She is not ill-appearing, toxic-appearing or diaphoretic.     Comments: Patient without acute respiratory stress.  Patient is sitting comfortably in bed, no tripoding, use of accessory muscles.  Patient is speaking to me in full sentences.  Handling secretions well.  HENT:     Head: Normocephalic and atraumatic.      Jaw: There is normal jaw occlusion. No trismus, swelling or malocclusion.     Nose: No congestion or rhinorrhea.     Right Sinus: No maxillary sinus tenderness or frontal sinus tenderness.     Left Sinus: No maxillary sinus tenderness or frontal sinus tenderness.     Mouth/Throat:     Mouth: Mucous membranes are moist. No oral lesions.     Dentition: Normal dentition.     Tongue: No lesions.     Palate: No mass and lesions.     Pharynx: Oropharynx is clear. Uvula midline. No pharyngeal swelling, oropharyngeal exudate, posterior oropharyngeal erythema or uvula swelling.     Tonsils: No tonsillar exudate or tonsillar abscesses. 1+ on the right. 1+ on the left.     Comments: Patient without tonsillar enlargement or exudate.  No signs of peritonsillar abscess, palate without any tenderness or masses palpated.  No swelling under the tongue, uvula is midline without any inflammation. Eyes:  General: No visual field deficit.       Right eye: No discharge.        Left eye: No discharge.     Extraocular Movements: Extraocular movements intact.     Conjunctiva/sclera: Conjunctivae normal.     Pupils: Pupils are equal, round, and reactive to light.  Cardiovascular:     Rate and Rhythm: Normal rate and regular rhythm.     Pulses: Normal pulses.     Heart sounds: Normal heart sounds. No murmur heard.  No friction rub. No gallop.   Pulmonary:     Effort: Pulmonary effort is normal. No respiratory distress.     Breath sounds: No stridor. Wheezing present. No rhonchi or rales.  Chest:     Chest wall: No tenderness.  Abdominal:     General: Abdomen is flat.     Palpations: Abdomen is soft.     Tenderness: There is no abdominal tenderness.  Musculoskeletal:        General: No swelling or tenderness. Normal range of motion.     Cervical back: Normal range of motion. No rigidity or tenderness.     Right lower leg: No edema.     Left lower leg: No edema.  Lymphadenopathy:     Cervical: No  cervical adenopathy.  Skin:    General: Skin is warm and dry.     Capillary Refill: Capillary refill takes less than 2 seconds.     Findings: No erythema or rash.  Neurological:     General: No focal deficit present.     Mental Status: She is alert and oriented to person, place, and time.     Cranial Nerves: Cranial nerves are intact. No cranial nerve deficit or facial asymmetry.     Motor: Motor function is intact. No weakness.     Coordination: Coordination is intact.     Gait: Gait is intact. Gait normal.  Psychiatric:        Mood and Affect: Mood normal.     ED Results / Procedures / Treatments   Labs (all labs ordered are listed, but only abnormal results are displayed) Labs Reviewed  RESPIRATORY PANEL BY RT PCR (FLU A&B, COVID)    EKG None  Radiology DG Chest 2 View  Result Date: 09/25/2020 CLINICAL DATA:  Shortness of breath with cough and congestion EXAM: CHEST - 2 VIEW COMPARISON:  October 21, 2018 FINDINGS: There is scarring in the right base region with equivocal pleural effusions bilaterally. There is mild scarring in the right perihilar region and more subtly in the left upper lobe. No edema or airspace opacity. Heart size and pulmonary vascularity are normal. No adenopathy. There is aortic atherosclerosis. No demonstrable bone lesions. IMPRESSION: Areas of scarring, most notably in the right base region. A somewhat equivocal pleural effusions bilaterally. No edema or airspace opacity. Heart size normal. Aortic Atherosclerosis (ICD10-I70.0). Electronically Signed   By: Bretta Bang III M.D.   On: 09/25/2020 10:23    Procedures Procedures (including critical care time)  Medications Ordered in ED Medications  dexamethasone (DECADRON) injection 8 mg (8 mg Intramuscular Given 09/25/20 1229)  ipratropium (ATROVENT HFA) inhaler 2 puff (2 puffs Inhalation Given 09/25/20 1229)  albuterol (VENTOLIN HFA) 108 (90 Base) MCG/ACT inhaler 4 puff (4 puffs Inhalation Given  09/25/20 1229)  ipratropium-albuterol (DUONEB) 0.5-2.5 (3) MG/3ML nebulizer solution 3 mL (3 mLs Nebulization Given 09/25/20 1526)    ED Course  I have reviewed the triage vital signs and the nursing notes.  Pertinent labs & imaging results that were available during my care of the patient were reviewed by me and considered in my medical decision making (see chart for details).    MDM Rules/Calculators/A&P                          Breanna Ramirez is a 64 y.o. female with past medical history of COPD, hypertension, pulmonary fibrosis that presents the emergency department today with cough and nasal congestion for the past 7 to 8 days. Pt has been vaccinated against COVID. Lung exam with diffuse wheezing satting at 95% on RA. Does not wear O2 at home.  Normally sats around 97%, no true respiratory distress. Will give Atrovent and Albuterol and Decadron until COVID results. Do not think we need EKG, labs at this time. Pt does not feel SOB and no chest pain, no fevers, normal vitals.  Chest x-ray with area of scarring, most likely from pulmonary fibrosis with equivocal bilateral pleural effusions.  COVID test negative. I do not think patient is having true COPD exacerbation due to no resp distress, pt feels better with Albuterol and Atrovent, however still has mild wheezing. Will give duoneb at this time. Upon reassessment, now 98% on room air, feels much better. Pt wants to go home.   Shared decision making about prednisone and antibiotics for questionable mild COPD exacerbation, she does want this at this time. I think pt most likely has bronchitis.  Will give referral to pulmonologist since patient does not have 1 and patient does have pulmonary fibrosis and COPD.  Patient to follow-up with pulmonologist about chest x-ray as well.  Patient to be discharged.   Doubt need for further emergent work up at this time. I explained the diagnosis and have given explicit precautions to return to the ER  including for any other new or worsening symptoms. The patient understands and accepts the medical plan as it's been dictated and I have answered their questions. Discharge instructions concerning home care and prescriptions have been given. The patient is STABLE and is discharged to home in good condition.    Final Clinical Impression(s) / ED Diagnoses Final diagnoses:  Cough  Bronchitis  Acute upper respiratory infection    Rx / DC Orders ED Discharge Orders         Ordered    predniSONE (DELTASONE) 10 MG tablet  Daily        09/25/20 1547    azithromycin (ZITHROMAX) 500 MG tablet  Daily        09/25/20 1547    Ambulatory referral to Pulmonology        09/25/20 1549           Farrel Gordon, PA-C 09/26/20 1237    Lorre Nick, MD 09/27/20 Corky Crafts

## 2020-09-25 NOTE — ED Triage Notes (Signed)
Patient c/o nasal congestion and a non productive cough x 5 days. Patient denies any other symptoms.

## 2020-09-25 NOTE — Discharge Instructions (Addendum)
You are seen today for cough, most likely have bronchitis.  Take the antibiotics and steroids as prescribed, want you to follow-up with your primary care in the next couple of days.  Keep an eye on her oxygen, if it does drop below 88% or you feel severely short of breath please come back to the emergency department.  Please use the attached instructions.  If you have any new or worsening concerning symptoms please come back to the emergency priming.  Also want you to follow-up with a pulmonologist due to your COPD and pulmonary fibrosis.  They should call you within a week, however if they do not please call them.  I want you to have a repeat chest x-ray when you see them.

## 2021-01-21 ENCOUNTER — Other Ambulatory Visit: Payer: Self-pay | Admitting: Emergency Medicine

## 2021-01-21 DIAGNOSIS — Z1231 Encounter for screening mammogram for malignant neoplasm of breast: Secondary | ICD-10-CM

## 2021-03-06 ENCOUNTER — Ambulatory Visit: Payer: BC Managed Care – PPO

## 2021-03-08 ENCOUNTER — Other Ambulatory Visit: Payer: Self-pay

## 2021-03-08 ENCOUNTER — Ambulatory Visit
Admission: RE | Admit: 2021-03-08 | Discharge: 2021-03-08 | Disposition: A | Discharge status: Home / Self Care | Payer: BC Managed Care – PPO | Source: Ambulatory Visit | Attending: Emergency Medicine | Admitting: Emergency Medicine

## 2021-03-08 DIAGNOSIS — Z1231 Encounter for screening mammogram for malignant neoplasm of breast: Secondary | ICD-10-CM | POA: Diagnosis not present

## 2021-10-31 ENCOUNTER — Emergency Department (HOSPITAL_BASED_OUTPATIENT_CLINIC_OR_DEPARTMENT_OTHER): Payer: BC Managed Care – PPO

## 2021-10-31 ENCOUNTER — Emergency Department (HOSPITAL_BASED_OUTPATIENT_CLINIC_OR_DEPARTMENT_OTHER)
Admission: EM | Admit: 2021-10-31 | Discharge: 2021-10-31 | Disposition: A | Discharge status: Home / Self Care | Payer: BC Managed Care – PPO | Attending: Emergency Medicine | Admitting: Emergency Medicine

## 2021-10-31 ENCOUNTER — Other Ambulatory Visit: Payer: Self-pay

## 2021-10-31 ENCOUNTER — Encounter (HOSPITAL_BASED_OUTPATIENT_CLINIC_OR_DEPARTMENT_OTHER): Payer: Self-pay | Admitting: Emergency Medicine

## 2021-10-31 DIAGNOSIS — Z87891 Personal history of nicotine dependence: Secondary | ICD-10-CM | POA: Diagnosis not present

## 2021-10-31 DIAGNOSIS — U071 COVID-19: Secondary | ICD-10-CM

## 2021-10-31 DIAGNOSIS — J441 Chronic obstructive pulmonary disease with (acute) exacerbation: Secondary | ICD-10-CM | POA: Diagnosis not present

## 2021-10-31 DIAGNOSIS — I10 Essential (primary) hypertension: Secondary | ICD-10-CM | POA: Insufficient documentation

## 2021-10-31 DIAGNOSIS — J449 Chronic obstructive pulmonary disease, unspecified: Secondary | ICD-10-CM

## 2021-10-31 DIAGNOSIS — R0602 Shortness of breath: Secondary | ICD-10-CM | POA: Diagnosis not present

## 2021-10-31 DIAGNOSIS — Z79899 Other long term (current) drug therapy: Secondary | ICD-10-CM | POA: Diagnosis not present

## 2021-10-31 DIAGNOSIS — R7989 Other specified abnormal findings of blood chemistry: Secondary | ICD-10-CM | POA: Diagnosis not present

## 2021-10-31 LAB — I-STAT VENOUS BLOOD GAS, ED
Acid-Base Excess: 2 mmol/L (ref 0.0–2.0)
Bicarbonate: 27.9 mmol/L (ref 20.0–28.0)
Calcium, Ion: 1.2 mmol/L (ref 1.15–1.40)
HCT: 34 % — ABNORMAL LOW (ref 36.0–46.0)
Hemoglobin: 11.6 g/dL — ABNORMAL LOW (ref 12.0–15.0)
O2 Saturation: 30 %
Potassium: 3.7 mmol/L (ref 3.5–5.1)
Sodium: 139 mmol/L (ref 135–145)
TCO2: 29 mmol/L (ref 22–32)
pCO2, Ven: 48.4 mmHg (ref 44.0–60.0)
pH, Ven: 7.368 (ref 7.250–7.430)
pO2, Ven: 20 mmHg — CL (ref 32.0–45.0)

## 2021-10-31 LAB — COMPREHENSIVE METABOLIC PANEL
ALT: 9 U/L (ref 0–44)
AST: 12 U/L — ABNORMAL LOW (ref 15–41)
Albumin: 3.8 g/dL (ref 3.5–5.0)
Alkaline Phosphatase: 52 U/L (ref 38–126)
Anion gap: 9 (ref 5–15)
BUN: 11 mg/dL (ref 8–23)
CO2: 27 mmol/L (ref 22–32)
Calcium: 9 mg/dL (ref 8.9–10.3)
Chloride: 103 mmol/L (ref 98–111)
Creatinine, Ser: 0.73 mg/dL (ref 0.44–1.00)
GFR, Estimated: >60 mL/min (ref >60)
Glucose, Bld: 79 mg/dL (ref 70–99)
Potassium: 4.1 mmol/L (ref 3.5–5.1)
Sodium: 139 mmol/L (ref 135–145)
Total Bilirubin: 0.2 mg/dL — ABNORMAL LOW (ref 0.3–1.2)
Total Protein: 6.9 g/dL (ref 6.5–8.1)

## 2021-10-31 LAB — CBC WITH DIFFERENTIAL/PLATELET
Abs Immature Granulocytes: 0.02 10*3/uL (ref 0.00–0.07)
Basophils Absolute: 0 10*3/uL (ref 0.0–0.1)
Basophils Relative: 1 %
Eosinophils Absolute: 0.2 10*3/uL (ref 0.0–0.5)
Eosinophils Relative: 3 %
HCT: 36.4 % (ref 36.0–46.0)
Hemoglobin: 11.1 g/dL — ABNORMAL LOW (ref 12.0–15.0)
Immature Granulocytes: 0 %
Lymphocytes Relative: 20 %
Lymphs Abs: 1.1 10*3/uL (ref 0.7–4.0)
MCH: 26.1 pg (ref 26.0–34.0)
MCHC: 30.5 g/dL (ref 30.0–36.0)
MCV: 85.4 fL (ref 80.0–100.0)
Monocytes Absolute: 0.6 10*3/uL (ref 0.1–1.0)
Monocytes Relative: 10 %
Neutro Abs: 3.6 10*3/uL (ref 1.7–7.7)
Neutrophils Relative %: 66 %
Platelets: 297 10*3/uL (ref 150–400)
RBC: 4.26 MIL/uL (ref 3.87–5.11)
RDW: 14.4 % (ref 11.5–15.5)
WBC: 5.5 10*3/uL (ref 4.0–10.5)
nRBC: 0 % (ref 0.0–0.2)

## 2021-10-31 LAB — RESP PANEL BY RT-PCR (FLU A&B, COVID) ARPGX2
Influenza A by PCR: NEGATIVE
Influenza B by PCR: NEGATIVE
SARS Coronavirus 2 by RT PCR: POSITIVE — AB

## 2021-10-31 LAB — D-DIMER, QUANTITATIVE: D-Dimer, Quant: 1.53 ug/mL-FEU — ABNORMAL HIGH (ref 0.00–0.50)

## 2021-10-31 MED ORDER — IOHEXOL 350 MG/ML SOLN
75.0000 mL | Freq: Once | INTRAVENOUS | Status: AC | PRN
  Administered 2021-10-31: 75 mL via INTRAVENOUS

## 2021-10-31 MED ORDER — IPRATROPIUM-ALBUTEROL 0.5-2.5 (3) MG/3ML IN SOLN
3.0000 mL | Freq: Once | RESPIRATORY_TRACT | Status: AC
  Administered 2021-10-31: 3 mL via RESPIRATORY_TRACT
  Filled 2021-10-31: qty 3

## 2021-10-31 MED ORDER — PREDNISONE 10 MG PO TABS: 40.0000 mg | ORAL_TABLET | Freq: Every day | ORAL | 0 refills | Status: AC

## 2021-10-31 MED ORDER — ALBUTEROL SULFATE (2.5 MG/3ML) 0.083% IN NEBU
2.5000 mg | INHALATION_SOLUTION | Freq: Once | RESPIRATORY_TRACT | Status: AC
  Administered 2021-10-31: 2.5 mg via RESPIRATORY_TRACT
  Filled 2021-10-31: qty 3

## 2021-10-31 MED ORDER — METHYLPREDNISOLONE SODIUM SUCC 125 MG IJ SOLR
125.0000 mg | Freq: Once | INTRAMUSCULAR | Status: AC
  Administered 2021-10-31: 125 mg via INTRAVENOUS
  Filled 2021-10-31: qty 2

## 2021-10-31 NOTE — ED Triage Notes (Signed)
Pt tested positive for covid on Sunday, woke up this am with shortness of breath.

## 2021-10-31 NOTE — ED Triage Notes (Signed)
Pt was treated with paxlovid, started Sunday.

## 2021-10-31 NOTE — ED Provider Notes (Signed)
[facility] EMERGENCY DEPT Provider Note   CSN: [id number] Arrival date & time: 10/31/21  6433     History Chief Complaint  Patient presents with   Shortness of Breath    Breanna Ramirez is a 65 y.o. female.   Shortness of Breath Associated symptoms: wheezing   Associated symptoms: no abdominal pain, no chest pain, no cough, no ear pain, no fever, no rash, no sore throat and no vomiting    65 year old female with a history of COPD presenting to the emergency department with worsening shortness of breath.  The patient states that she tested positive for COVID on Sunday.  She has been treated outpatient with Paxil bid and states that she had been doing fairly well.  She states that this morning she woke up with worsening shortness of breath and increasing wheezing.  She denies any fevers or chills.  She is overall tolerating oral intake.  She is ambulatory without significant dyspnea.  She has a home albuterol nebulizer but has not been utilizing it due to lack of need.  Past Medical History:  Diagnosis Date   COPD (chronic obstructive pulmonary disease) (HCC)    Essential hypertension    GERD (gastroesophageal reflux disease)    Pneumonia 05/2017   Respiratory failure with hypoxia (HCC) 05/2017    Patient Active Problem List   Diagnosis Date Noted   Acute respiratory failure with hypoxia (HCC) 10/21/2018   Microcytic anemia 10/21/2018   COPD with acute exacerbation (HCC) 10/21/2018   CAP (community acquired pneumonia) 06/18/2017   Respiratory failure with hypoxia (HCC) 06/18/2017   Essential hypertension, benign 09/13/2014   CAFL (chronic airflow limitation) (HCC) 04/26/2012   SINUSITIS, CHRONIC 12/14/2007   BRONCHITIS 12/14/2007   PULMONARY FIBROSIS, 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     Family History   Problem Relation Age of Onset   Cancer Father     Social History   Tobacco Use   Smoking status: Former    Packs/day: 0.50    Years: 40.00    Pack years: 20.00    Types: Cigarettes   Smokeless tobacco: Never  Vaping Use   Vaping Use: Never used  Substance Use Topics   Alcohol use: No   Drug use: No    Home Medications Prior to Admission medications   Medication Sig Start Date End Date Taking? Authorizing Provider  aspirin 325 MG tablet Take 325 mg by mouth daily as needed for mild pain.   Yes [provider]  esomeprazole (NEXIUM) 40 MG capsule Take 1 capsule (40 mg total) by mouth daily. 09/13/14  Yes Hawks, Christy A, FNP  hydrochlorothiazide (HYDRODIURIL) 25 MG tablet Take 1 tablet (25 mg total) by mouth daily. 09/13/14  Yes Hawks, Christy A, FNP  predniSONE (DELTASONE) 10 MG tablet Take 4 tablets (40 mg total) by mouth daily for 5 days. 10/31/21 11/05/21 Yes Ernie Avena, MD  simvastatin (ZOCOR) 40 MG tablet Take 1 tablet (40 mg total) by mouth at bedtime. 09/15/14  Yes Hawks, Christy A, FNP  albuterol (PROVENTIL HFA;VENTOLIN HFA) 108 (90 Base) MCG/ACT inhaler Inhale 1-2 puffs into the lungs every 6 (six) hours as needed for wheezing or shortness of breath.    [provider]  amoxicillin-clavulanate (AUGMENTIN) 875-125 MG tablet Take 1 tablet by mouth 2 (two) times daily.  10/23/18   Marinda ElkFeliz Ortiz, Abraham, MD  ferrous sulfate 325 (65 FE) MG tablet Take 1 tablet (325 mg total) by mouth daily. 10/23/18 10/23/19  Marinda ElkFeliz Ortiz, Abraham, MD  umeclidinium-vilanterol (ANORO ELLIPTA) 62.5-25 MCG/INH AEPB Inhale 1 puff into the lungs daily. Patient not taking: Reported on 10/21/2018 06/23/17   Charlsie QuestPatel, Vishal R, MD    Allergies    Prednisone and Codeine  Review of Systems   Review of Systems  Constitutional:  Negative for chills and fever.  HENT:  Negative for ear pain and sore throat.   Eyes:  Negative for pain and visual disturbance.  Respiratory:  Positive for  shortness of breath and wheezing. Negative for cough.   Cardiovascular:  Negative for chest pain and palpitations.  Gastrointestinal:  Negative for abdominal pain and vomiting.  Genitourinary:  Negative for dysuria and hematuria.  Musculoskeletal:  Negative for arthralgias and back pain.  Skin:  Negative for color change and rash.  Neurological:  Negative for seizures and syncope.  All other systems reviewed and are negative.  Physical Exam Updated Vital Signs BP 128/63   Pulse 99   Temp 98.4 F (36.9 C)   Resp (!) 21   SpO2 92%   Physical Exam Vitals and nursing note reviewed.  Constitutional:      General: She is not in acute distress.    Appearance: She is well-developed.  HENT:     Head: Normocephalic and atraumatic.  Eyes:     Conjunctiva/sclera: Conjunctivae normal.     Pupils: Pupils are equal, round, and reactive to light.  Cardiovascular:     Rate and Rhythm: Normal rate and regular rhythm.     Heart sounds: No murmur heard. Pulmonary:     Effort: Pulmonary effort is normal. No respiratory distress.     Breath sounds: Wheezing and rhonchi present.  Abdominal:     General: There is no distension.     Palpations: Abdomen is soft.     Tenderness: There is no abdominal tenderness. There is no guarding.  Musculoskeletal:        General: No deformity or signs of injury.     Cervical back: Normal range of motion and neck supple.  Skin:    General: Skin is warm and dry.     Findings: No lesion or rash.  Neurological:     General: No focal deficit present.     Mental Status: She is alert. Mental status is at baseline.    ED Results / Procedures / Treatments   Labs (all labs ordered are listed, but only abnormal results are displayed) Labs Reviewed  RESP PANEL BY RT-PCR (FLU A&B, COVID) ARPGX2 - Abnormal; Notable for the following components:      Result Value   SARS Coronavirus 2 by RT PCR POSITIVE (*)    All other components within normal limits   COMPREHENSIVE METABOLIC PANEL - Abnormal; Notable for the following components:   AST 12 (*)    Total Bilirubin 0.2 (*)    All other components within normal limits  CBC WITH DIFFERENTIAL/PLATELET - Abnormal; Notable for the following components:   Hemoglobin 11.1 (*)    All other components within normal limits  D-DIMER, QUANTITATIVE - Abnormal; Notable for the following components:   D-Dimer, Quant 1.53 (*)    All other components within normal limits  I-STAT VENOUS BLOOD GAS, ED - Abnormal; Notable for the following components:   pO2, Ven 20.0 (*)    HCT 34.0 (*)  Hemoglobin 11.6 (*)    All other components within normal limits    EKG EKG Interpretation  Date/Time:  Thursday October 31 2021 09:08:07 EDT Ventricular Rate:  80 PR Interval:  140 QRS Duration: 94 QT Interval:  369 QTC Calculation: 426 R Axis:   69 Text Interpretation: Sinus rhythm Confirmed by Regan Lemming ([id number]) on 10/31/2021 9:40:23 AM  Radiology CT Angio Chest PE W and/or Wo Contrast  Result Date: 10/31/2021 CLINICAL DATA:  Positive D-dimer. COVID-19 positive. Shortness of breath. EXAM: CT ANGIOGRAPHY CHEST WITH CONTRAST TECHNIQUE: Multidetector CT imaging of the chest was performed using the standard protocol during bolus administration of intravenous contrast. Multiplanar CT image reconstructions and MIPs were obtained to evaluate the vascular anatomy. CONTRAST:  72mL OMNIPAQUE IOHEXOL 350 MG/ML SOLN COMPARISON:  Plain film 10/31/2021.  Chest CT 06/21/2017. FINDINGS: Cardiovascular: The quality of this exam for evaluation of pulmonary embolism is good. No evidence of pulmonary embolism. Aortic atherosclerosis. Normal heart size, without pericardial effusion. Three vessel coronary artery calcification. Mediastinum/Nodes: Prominent anterior and middle mediastinal nodes are similar to 2018 and likely reactive. Upper normal right infrahilar node of 1.0 cm is likely similar on the prior exam. Debris in the upper  esophagus including on 38/4. Lungs/Pleura: No pleural fluid. Mild to moderate centrilobular emphysema. Hypoventilation with areas of chronic volume loss and scarring within both lung bases. Mosaic attenuation is likely related to air trapping. Upper Abdomen: Normal imaged portions of the liver, spleen, stomach, pancreas, gallbladder, adrenal glands, kidneys. Musculoskeletal: Mild osteopenia. Review of the MIP images confirms the above findings. IMPRESSION: 1.  No evidence of pulmonary embolism. 2. Low lung volumes with chronic bibasilar volume loss and areas of atelectasis/scarring. Mosaic attenuation is indicative of air trapping. 3. Debris in the esophagus, suggesting dysmotility or gastroesophageal reflux. 4. Age advanced coronary artery atherosclerosis. Recommend assessment of coronary risk factors and consideration of medical therapy. 5. Aortic atherosclerosis (ICD10-I70.0) and emphysema (ICD10-J43.9). Electronically Signed   By: Abigail Miyamoto M.D.   On: 10/31/2021 10:54   DG Chest Port 1 View  Result Date: 10/31/2021 CLINICAL DATA:  COVID positive.  Shortness of breath. EXAM: PORTABLE CHEST 1 VIEW COMPARISON:  09/25/2020 FINDINGS: Chronic coarse interstitial lung markings are similar to the exam in 2021. No large areas of lung consolidation. Heart size is stable. Atherosclerotic calcifications at the aortic arch. Trachea is midline. Focal lucency near the right costophrenic angle is similar to the previous chest radiograph. IMPRESSION: Diffuse coarse interstitial lung markings are suggestive for chronic changes. Difficult to exclude mild acute or chronic disease. Electronically Signed   By: Markus Daft M.D.   On: 10/31/2021 09:25    Procedures Procedures   Medications Ordered in ED Medications  albuterol (PROVENTIL) (2.5 MG/3ML) 0.083% nebulizer solution 2.5 mg (2.5 mg Nebulization Given 10/31/21 0920)  ipratropium-albuterol (DUONEB) 0.5-2.5 (3) MG/3ML nebulizer solution 3 mL (3 mLs Nebulization Given  10/31/21 0920)  methylPREDNISolone sodium succinate (SOLU-MEDROL) 125 mg/2 mL injection 125 mg (125 mg Intravenous Given 10/31/21 0933)  iohexol (OMNIPAQUE) 350 MG/ML injection 75 mL (75 mLs Intravenous Contrast Given 10/31/21 1036)    ED Course  I have reviewed the triage vital signs and the nursing notes.  Pertinent labs & imaging results that were available during my care of the patient were reviewed by me and considered in my medical decision making (see chart for details).  Clinical Course as of 10/31/21 1335  Thu Oct 31, 2021  1037 SARS Coronavirus 2 by RT PCR(!): POSITIVE [JL]  1037  D-Dimer, Quant(!): 1.53 [JL]    Clinical Course User Index [JL] Regan Lemming, MD   MDM Rules/Calculators/A&P                           65 year old female with a history of COPD presenting to the emergency department with worsening shortness of breath.  The patient states that she tested positive for COVID on Sunday.  She has been treated outpatient with Paxil bid and states that she had been doing fairly well.  She states that this morning she woke up with worsening shortness of breath and increasing wheezing.  She denies any fevers or chills.  She is overall tolerating oral intake.  She is ambulatory without significant dyspnea.  She has a home albuterol nebulizer but has not been utilizing it due to lack of need.  On arrival, the patient was afebrile, hemodynamically stable, not tachycardic or tachypneic, saturating well on room air.  An amatory pulse oximetry was performed which was notable for the patient maintaining oxygen saturations greater than 93% on room air.  She presents with worsening shortness of breath in the setting of known COVID-19 infection.  She has a known history of COPD and intermittently utilizes a home nebulizer.  She does not follow outpatient with a pulmonologist.  Differential diagnosis includes worsening symptoms of COVID-19 status post back COVID, PE, bacterial pneumonia, less  likely pneumothorax, ACS.  A D-dimer was collected and resulted positive at 1.53.  A CTA PE study was performed was wrist revealed no evidence of bacterial pneumonia, pneumothorax, no evidence of PE.  A VBG was performed which did not reveal evidence of acute COPD exacerbation with a normal pH and normal PCO2.  CBC was without a leukocytosis, stable anemia to 11.1, no significant platelet abnormality, CMP generally unremarkable.  The patient was overall well-appearing, tolerating oral intake, ambulatory in the emergency department without significant dyspnea.  She was symptomatically somewhat improved following nebulizer treatments and IV Solu-Medrol.  Symptoms likely due to worsening COVID-19 infection.  No oxygen requirement to suggest need for inpatient hospitalization at this time.  The patient was provided with return precautions and advised to continue symptomatic management of her COVID-19 at home.  Prescription for prednisone was provided.  Final Clinical Impression(s) / ED Diagnoses Final diagnoses:  Chronic obstructive pulmonary disease, unspecified COPD type (HCC)  COVID-19    Rx / DC Orders ED Discharge Orders          Ordered    predniSONE (DELTASONE) 10 MG tablet  Daily        11 /03/22 1135             Regan Lemming, MD 10/31/21 1335

## 2021-10-31 NOTE — Discharge Instructions (Signed)
Your CT imaging did not reveal evidence of bacterial pneumonia or a blood clot.  You likely have symptoms consistent with COVID-19 that are worsening your chronic lung disease.  Recommend continued albuterol nebulizer treatment at home and a 5-day course of steroids.  If breathing worsens please return to the emergency department for reconsideration for potential hospital admission.

## 2021-10-31 NOTE — ED Notes (Signed)
EKG obtained at arrival to room. Unable to print EKG at this time, but Dr. Karene Fry aware and EKG is in Epic for physician review.

## 2022-02-03 ENCOUNTER — Other Ambulatory Visit: Payer: Self-pay | Admitting: Emergency Medicine

## 2022-02-03 DIAGNOSIS — Z1231 Encounter for screening mammogram for malignant neoplasm of breast: Secondary | ICD-10-CM

## 2022-02-12 ENCOUNTER — Encounter: Payer: Self-pay | Admitting: Pulmonary Disease

## 2022-02-12 ENCOUNTER — Other Ambulatory Visit: Payer: Self-pay

## 2022-02-12 ENCOUNTER — Ambulatory Visit: Payer: BC Managed Care – PPO | Admitting: Pulmonary Disease

## 2022-02-12 VITALS — BP 126/70 | HR 84 | Ht 65.0 in | Wt 151.2 lb

## 2022-02-12 DIAGNOSIS — Z87891 Personal history of nicotine dependence: Secondary | ICD-10-CM

## 2022-02-12 DIAGNOSIS — J441 Chronic obstructive pulmonary disease with (acute) exacerbation: Secondary | ICD-10-CM | POA: Diagnosis not present

## 2022-02-12 MED ORDER — AZITHROMYCIN 250 MG PO TABS: ORAL_TABLET | ORAL | 0 refills | Status: DC

## 2022-02-12 MED ORDER — MONTELUKAST SODIUM 10 MG PO TABS: 10.0000 mg | ORAL_TABLET | Freq: Every day | ORAL | 11 refills | Status: DC

## 2022-02-12 MED ORDER — IPRATROPIUM-ALBUTEROL 0.5-2.5 (3) MG/3ML IN SOLN: RESPIRATORY_TRACT | 6 refills | Status: DC

## 2022-02-12 MED ORDER — PREDNISONE 10 MG PO TABS: 40.0000 mg | ORAL_TABLET | Freq: Every day | ORAL | 0 refills | Status: DC

## 2022-02-12 NOTE — Progress Notes (Signed)
Synopsis: Referred in February 2023 for wheezing by Ailene Rud, NP  Subjective:   PATIENT ID: Breanna Ramirez GENDER: female DOB: 1956/04/02, MRN: [id number]  HPI  Chief Complaint  Patient presents with   Consult    Referred by health service at work for wheezing over the past 4 months. Also had a cough.    Breanna Ramirez is a 66 year old woman, former smoker with COPD and GERD who is referred to pulmonary clinic for wheezing.   She reports being treated in September 2022 for pneumonia and later developed COVID-19 infection in late October 2022 in which she was treated with Paxlovid.  She again was sick with pneumonia in December 2022 and responded well to antibiotics, steroids and using Symbicort inhaler.  She tapered herself off the Symbicort inhaler in January after she was feeling better.  She started to have wheezing towards the end of January along with shortness of breath.  She denies any cough or sputum production.  She reports that she notices the wheezing mostly at nighttime when going to bed.  She quit smoking in 2019.  She has a 40+ pack year smoking history.  She has significant secondhand smoke exposure in childhood.  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  She denies any significant dust or chemical exposures.  She does have significant reflux and seasonal allergies.  She reports springtime is the most difficult season for her due to the pollen.  She denies any increased cough or dysphagia when eating or drinking.  She denies the sensation of food getting stuck in her throat or chest.  Her father had asthma and her maternal grandfather had asthma.  Past Medical History:  Diagnosis Date   COPD (chronic obstructive pulmonary disease) ([place])    Essential hypertension    GERD (gastroesophageal reflux disease)    Pneumonia 05/2017   Respiratory failure with hypoxia ([place]) 05/2017     Family History  Problem Relation Age of Onset   Cancer Father      Social  History   Socioeconomic History   Marital status: Single    Spouse name: Not on file   Number of children: 1   Years of education: Not on file   Highest education level: Not on file  Occupational History   Occupation: Works in [employer] fundraiser.  Tobacco Use   Smoking status: Former    Packs/day: 0.50    Years: 40.00    Pack years: 20.00    Types: Cigarettes    Quit date: 12/29/2017    Years since quitting: 4.1   Smokeless tobacco: Never  Vaping Use   Vaping Use: Never used  Substance and Sexual Activity   Alcohol use: No   Drug use: No   Sexual activity: Never    Birth control/protection: Post-menopausal  Other Topics Concern   Not on file  Social History Narrative   Not on file   Social Determinants of Health   Financial Resource Strain: Not on file  Food Insecurity: Not on file  Transportation Needs: Not on file  Physical Activity: Not on file  Stress: Not on file  Social Connections: Not on file  Intimate Partner Violence: Not on file     Allergies  Allergen Reactions   Prednisone Palpitations    Tolerates DepoMedrol well in oral and injectable.    Codeine Nausea And Vomiting     Outpatient Medications Prior to Visit  Medication Sig Dispense Refill   albuterol (PROVENTIL HFA;VENTOLIN  HFA) 108 (90 Base) MCG/ACT inhaler Inhale 1-2 puffs into the lungs every 6 (six) hours as needed for wheezing or shortness of breath.     aspirin 325 MG tablet Take 325 mg by mouth daily as needed for mild pain.     esomeprazole (NEXIUM) 40 MG capsule Take 1 capsule (40 mg total) by mouth daily. 90 capsule 4   ferrous sulfate 325 (65 FE) MG tablet Take 1 tablet (325 mg total) by mouth daily. 30 tablet 3   fluticasone (FLONASE) 50 MCG/ACT nasal spray Place 1 spray into both nostrils 2 (two) times daily.     hydrochlorothiazide (HYDRODIURIL) 25 MG tablet Take 1 tablet (25 mg total) by mouth daily. 90 tablet 3   simvastatin (ZOCOR) 40 MG tablet Take 1 tablet (40 mg total) by mouth at  bedtime. 90 tablet 3   SYMBICORT 160-4.5 MCG/ACT inhaler Inhale 2 puffs into the lungs 2 (two) times daily.     ipratropium-albuterol (DUONEB) 0.5-2.5 (3) MG/3ML SOLN SMARTSIG:1 Ampule(s) Via Nebulizer 3 Times Daily PRN     amoxicillin-clavulanate (AUGMENTIN) 875-125 MG tablet Take 1 tablet by mouth 2 (two) times daily. 6 tablet 0   umeclidinium-vilanterol (ANORO ELLIPTA) 62.5-25 MCG/INH AEPB Inhale 1 puff into the lungs daily. (Patient not taking: Reported on 10/21/2018) 60 each 0   No facility-administered medications prior to visit.   Review of Systems  Constitutional:  Negative for chills, fever, malaise/fatigue and weight loss.  HENT:  Positive for congestion. Negative for sinus pain and sore throat.   Eyes: Negative.   Respiratory:  Positive for shortness of breath. Negative for cough, hemoptysis, sputum production and wheezing.   Cardiovascular:  Negative for chest pain, palpitations, orthopnea, claudication and leg swelling.  Gastrointestinal:  Negative for abdominal pain, heartburn, nausea and vomiting.  Genitourinary: Negative.   Musculoskeletal:  Negative for joint pain and myalgias.  Skin:  Negative for rash.  Neurological:  Positive for headaches. Negative for weakness.  Endo/Heme/Allergies: Negative.   Psychiatric/Behavioral: Negative.     Objective:   Vitals:   02/12/22 1522  BP: 126/70  Pulse: 84  SpO2: 98%  Weight: 151 lb 3.2 oz (68.6 kg)  Height: 5\' 5"  (1.651 m)   Physical Exam Constitutional:      General: She is not in acute distress.    Appearance: She is not ill-appearing.  HENT:     Head: Normocephalic and atraumatic.  Eyes:     General: No scleral icterus.    Conjunctiva/sclera: Conjunctivae normal.     Pupils: Pupils are equal, round, and reactive to light.  Cardiovascular:     Rate and Rhythm: Normal rate and regular rhythm.     Pulses: Normal pulses.     Heart sounds: Normal heart sounds. No murmur heard. Pulmonary:     Effort: Pulmonary  effort is normal.     Breath sounds: Wheezing and rhonchi present. No rales.  Abdominal:     General: Bowel sounds are normal.     Palpations: Abdomen is soft.  Musculoskeletal:     Right lower leg: No edema.     Left lower leg: No edema.  Lymphadenopathy:     Cervical: No cervical adenopathy.  Skin:    General: Skin is warm and dry.  Neurological:     General: No focal deficit present.     Mental Status: She is alert.  Psychiatric:        Mood and Affect: Mood normal.        Behavior: Behavior  normal.        Thought Content: Thought content normal.        Judgment: Judgment normal.   CBC    Component Value Date/Time   WBC 5.5 10/31/2021 0919   RBC 4.26 10/31/2021 0919   HGB 11.6 (L) 10/31/2021 1131   HCT 34.0 (L) 10/31/2021 1131   PLT 297 10/31/2021 0919   MCV 85.4 10/31/2021 0919   MCH 26.1 10/31/2021 0919   MCHC 30.5 10/31/2021 0919   RDW 14.4 10/31/2021 0919   LYMPHSABS 1.1 10/31/2021 0919   MONOABS 0.6 10/31/2021 0919   EOSABS 0.2 10/31/2021 0919   BASOSABS 0.0 10/31/2021 0919   BMP Latest Ref Rng & Units 10/31/2021 10/31/2021 10/22/2018  Glucose 70 - 99 mg/dL - 79 140(H)  BUN 8 - 23 mg/dL - 11 13  Creatinine 0.44 - 1.00 mg/dL - 0.73 0.73  BUN/Creat Ratio 9 - 23 - - -  Sodium 135 - 145 mmol/L 139 139 140  Potassium 3.5 - 5.1 mmol/L 3.7 4.1 4.0  Chloride 98 - 111 mmol/L - 103 106  CO2 22 - 32 mmol/L - 27 26  Calcium 8.9 - 10.3 mg/dL - 9.0 9.3   Chest imaging: CTA Chest PE 10/31/21 1.  No evidence of pulmonary embolism. 2. Low lung volumes with chronic bibasilar volume loss and areas of atelectasis/scarring. Mosaic attenuation is indicative of air trapping. 3. Debris in the esophagus, suggesting dysmotility or gastroesophageal reflux. 4. Age advanced coronary artery atherosclerosis. Recommend assessment of coronary risk factors and consideration of medical therapy. 5. Aortic atherosclerosis  PFT: No flowsheet data  found.  Labs:  Path:  Echo:  Heart Catheterization:  Assessment & Plan:   COPD with acute exacerbation ([place]) - Plan: predniSONE (DELTASONE) 10 MG tablet, ipratropium-albuterol (DUONEB) 0.5-2.5 (3) MG/3ML SOLN, azithromycin (ZITHROMAX) 250 MG tablet  Former smoker  Discussion: Breanna Ramirez is a 66 year old woman, former smoker with COPD and GERD who is referred to pulmonary clinic for wheezing.   Patient has acute COPD exacerbation with wheezing on exam.  She is to take prednisone 40 mg daily for 5 days along with Z-Pak.  She is to resume Symbicort 160-4.5 mcg 2 puffs twice daily.  I have also recommended that she use DuoNeb nebulizer treatment at bedtime and every 6 hours as needed.  She is to start Singulair 10 mg daily for seasonal allergies.  We will refer her to our lung cancer screening program based on her age and 40+ pack year smoking history and her recent quit date in 2019.  She is to follow-up in 3 months with pulmonary function testing.  Freda Jackson, MD [facility] Office: [phone]   Current Outpatient Medications:    albuterol (PROVENTIL HFA;VENTOLIN HFA) 108 (90 Base) MCG/ACT inhaler, Inhale 1-2 puffs into the lungs every 6 (six) hours as needed for wheezing or shortness of breath., Disp: , Rfl:    aspirin 325 MG tablet, Take 325 mg by mouth daily as needed for mild pain., Disp: , Rfl:    azithromycin (ZITHROMAX) 250 MG tablet, Take as directed, Disp: 6 tablet, Rfl: 0   esomeprazole (NEXIUM) 40 MG capsule, Take 1 capsule (40 mg total) by mouth daily., Disp: 90 capsule, Rfl: 4   ferrous sulfate 325 (65 FE) MG tablet, Take 1 tablet (325 mg total) by mouth daily., Disp: 30 tablet, Rfl: 3   fluticasone (FLONASE) 50 MCG/ACT nasal spray, Place 1 spray into both nostrils 2 (two) times daily., Disp: ,  Rfl:    hydrochlorothiazide (HYDRODIURIL) 25 MG tablet, Take 1 tablet (25 mg total) by mouth daily., Disp: 90 tablet, Rfl: 3   predniSONE  (DELTASONE) 10 MG tablet, Take 4 tablets (40 mg total) by mouth daily with breakfast., Disp: 20 tablet, Rfl: 0   simvastatin (ZOCOR) 40 MG tablet, Take 1 tablet (40 mg total) by mouth at bedtime., Disp: 90 tablet, Rfl: 3   SYMBICORT 160-4.5 MCG/ACT inhaler, Inhale 2 puffs into the lungs 2 (two) times daily., Disp: , Rfl:    ipratropium-albuterol (DUONEB) 0.5-2.5 (3) MG/3ML SOLN, SMARTSIG:1 Ampule(s) Via Nebulizer 3 Times Daily PRN, Disp: 360 mL, Rfl: 6

## 2022-02-12 NOTE — Patient Instructions (Signed)
Continue symbicort inhaler 2 puffs twice daily - rinse mouth out after each use  Use duoneb nebulizer treatment prior to bedtime and as needed every 6 hours  Start montelukast 10mg  daily for your allergies and breathing symptoms.   We will refer you to our lung cancer screening program  Follow up in 3 months with pulmonary function tests.

## 2022-02-24 ENCOUNTER — Telehealth: Payer: Self-pay | Admitting: Pulmonary Disease

## 2022-02-24 NOTE — Telephone Encounter (Signed)
Called and LVM in regards to patients request for LOV to be mailed. I have placed AVS in to be mailed pile.

## 2022-03-10 ENCOUNTER — Ambulatory Visit
Admission: RE | Admit: 2022-03-10 | Discharge: 2022-03-10 | Disposition: A | Discharge status: Home / Self Care | Payer: BC Managed Care – PPO | Source: Ambulatory Visit | Attending: Emergency Medicine | Admitting: Emergency Medicine

## 2022-03-10 DIAGNOSIS — Z1231 Encounter for screening mammogram for malignant neoplasm of breast: Secondary | ICD-10-CM

## 2022-05-12 ENCOUNTER — Encounter: Payer: Self-pay | Admitting: Pulmonary Disease

## 2022-05-12 ENCOUNTER — Ambulatory Visit: Payer: BC Managed Care – PPO | Admitting: Pulmonary Disease

## 2022-05-12 ENCOUNTER — Encounter: Payer: Self-pay | Admitting: *Deleted

## 2022-05-12 ENCOUNTER — Ambulatory Visit (INDEPENDENT_AMBULATORY_CARE_PROVIDER_SITE_OTHER): Payer: BC Managed Care – PPO | Admitting: Pulmonary Disease

## 2022-05-12 VITALS — BP 124/68 | HR 84 | Ht 65.0 in | Wt 153.6 lb

## 2022-05-12 DIAGNOSIS — Z87891 Personal history of nicotine dependence: Secondary | ICD-10-CM

## 2022-05-12 DIAGNOSIS — J441 Chronic obstructive pulmonary disease with (acute) exacerbation: Secondary | ICD-10-CM

## 2022-05-12 LAB — PULMONARY FUNCTION TEST
DL/VA % pred: 56 %
DL/VA: 2.33 ml/min/mmHg/L
DLCO cor % pred: 41 %
DLCO cor: 8.38 ml/min/mmHg
DLCO unc % pred: 41 %
DLCO unc: 8.38 ml/min/mmHg
FEF 25-75 Post: 1.27 L/sec
FEF 25-75 Pre: 0.98 L/sec
FEF2575-%Change-Post: 29 %
FEF2575-%Pred-Post: 59 %
FEF2575-%Pred-Pre: 45 %
FEV1-%Change-Post: 6 %
FEV1-%Pred-Post: 54 %
FEV1-%Pred-Pre: 51 %
FEV1-Post: 1.36 L
FEV1-Pre: 1.28 L
FEV1FVC-%Change-Post: 3 %
FEV1FVC-%Pred-Pre: 99 %
FEV6-%Change-Post: 2 %
FEV6-%Pred-Post: 55 %
FEV6-%Pred-Pre: 53 %
FEV6-Post: 1.72 L
FEV6-Pre: 1.67 L
FEV6FVC-%Pred-Post: 104 %
FEV6FVC-%Pred-Pre: 104 %
FVC-%Change-Post: 2 %
FVC-%Pred-Post: 52 %
FVC-%Pred-Pre: 51 %
FVC-Post: 1.72 L
FVC-Pre: 1.67 L
Post FEV1/FVC ratio: 79 %
Post FEV6/FVC ratio: 100 %
Pre FEV1/FVC ratio: 77 %
Pre FEV6/FVC Ratio: 100 %
RV % pred: 104 %
RV: 2.26 L
TLC % pred: 78 %
TLC: 4.06 L

## 2022-05-12 MED ORDER — PREDNISONE 10 MG PO TABS: ORAL_TABLET | ORAL | 0 refills | Status: AC

## 2022-05-12 MED ORDER — TRELEGY ELLIPTA 100-62.5-25 MCG/ACT IN AEPB
1.0000 | INHALATION_SPRAY | Freq: Every day | RESPIRATORY_TRACT | 6 refills | Status: DC
Start: 2022-05-12 — End: 2022-08-25

## 2022-05-12 MED ORDER — AZITHROMYCIN 250 MG PO TABS: ORAL_TABLET | ORAL | 0 refills | Status: DC

## 2022-05-12 NOTE — Progress Notes (Signed)
PFT done today. 

## 2022-05-12 NOTE — Patient Instructions (Signed)
Start prednisone taper ? ?Start Zpak ? ?Start Trelegy ellipta 1 puff daily  ?- rinse mouth out after each use ? ?Continue to use albuterol 1-2 puffs every 4-6 hours as needed ? ? ?

## 2022-05-12 NOTE — Progress Notes (Signed)
Synopsis: Referred in February 2023 for wheezing by Lilla Shook, NP  Subjective:   PATIENT ID: Breanna Ramirez GENDER: female DOB: 09/30/1956, MRN: [id number]  HPI  Chief Complaint  Patient presents with   Follow-up    F/U after PFT. Increased wheezing and congestion since last visit. Productive cough with yellow phlegm.    Breanna Ramirez is a 66 year old woman, former smoker with COPD and GERD who returns to pulmonary clinic for COPD.   She was treated for COPD exacerbation at last visit with prednisone taper with Zpak. She was started on singulair. She continues on symbicort 2 puffs twice dialy and as needed duoneb treatments. She reports relief from the prednisone taper but of recent she has increased cough, sputum production and wheezing.   PFTs show moderate restriction and diffusion defect. Flow volume loop does appear obstructed.   OV 02/12/22 She reports being treated in September 2022 for pneumonia and later developed COVID-19 infection in late October 2022 in which she was treated with Paxlovid.  She again was sick with pneumonia in December 2022 and responded well to antibiotics, steroids and using Symbicort inhaler.  She tapered herself off the Symbicort inhaler in January after she was feeling better.  She started to have wheezing towards the end of January along with shortness of breath.  She denies any cough or sputum production.  She reports that she notices the wheezing mostly at nighttime when going to bed.  She quit smoking in 2019.  She has a 40+ pack year smoking history.  She has significant secondhand smoke exposure in childhood.  She currently works as a Solicitor at a [employer]  She denies any significant dust or chemical exposures.  She does have significant reflux and seasonal allergies.  She reports springtime is the most difficult season for her due to the pollen.  She denies any increased cough or dysphagia when eating or drinking.  She denies the sensation of  food getting stuck in her throat or chest.  Her father had asthma and her maternal grandfather had asthma.  Past Medical History:  Diagnosis Date   COPD (chronic obstructive pulmonary disease) (HCC)    Essential hypertension    GERD (gastroesophageal reflux disease)    Pneumonia 05/2017   Respiratory failure with hypoxia (HCC) 05/2017     Family History  Problem Relation Age of Onset   Cancer Father      Social History   Socioeconomic History   Marital status: Single    Spouse name: Not on file   Number of children: 1   Years of education: Not on file   Highest education level: Not on file  Occupational History   Occupation: Works in Designer, fashion/clothing.  Tobacco Use   Smoking status: Former    Packs/day: 0.50    Years: 40.00    Pack years: 20.00    Types: Cigarettes    Quit date: 12/29/2017    Years since quitting: 4.3   Smokeless tobacco: Never  Vaping Use   Vaping Use: Never used  Substance and Sexual Activity   Alcohol use: No   Drug use: No   Sexual activity: Never    Birth control/protection: Post-menopausal  Other Topics Concern   Not on file  Social History Narrative   Not on file   Social Determinants of Health   Financial Resource Strain: Not on file  Food Insecurity: Not on file  Transportation Needs: Not on file  Physical Activity: Not on file  Stress: Not on file  Social Connections: Not on file  Intimate Partner Violence: Not on file     Allergies  Allergen Reactions   Prednisone Palpitations    Tolerates DepoMedrol well in oral and injectable.    Codeine Nausea And Vomiting     No facility-administered medications prior to visit.   Outpatient Medications Prior to Visit  Medication Sig Dispense Refill   albuterol (PROVENTIL HFA;VENTOLIN HFA) 108 (90 Base) MCG/ACT inhaler Inhale 1-2 puffs into the lungs every 6 (six) hours as needed for wheezing or shortness of breath.     aspirin 325 MG tablet Take 325 mg by mouth daily as needed for mild pain.      esomeprazole (NEXIUM) 40 MG capsule Take 1 capsule (40 mg total) by mouth daily. 90 capsule 4   fluticasone (FLONASE) 50 MCG/ACT nasal spray Place 1 spray into both nostrils 2 (two) times daily.     hydrochlorothiazide (HYDRODIURIL) 25 MG tablet Take 1 tablet (25 mg total) by mouth daily. 90 tablet 3   ipratropium-albuterol (DUONEB) 0.5-2.5 (3) MG/3ML SOLN SMARTSIG:1 Ampule(s) Via Nebulizer 3 Times Daily PRN (Patient taking differently: Take 3 mLs by nebulization See admin instructions. SMARTSIG:1 Ampule(s) Via Nebulizer 3 Times Daily PRN) 360 mL 6   montelukast (SINGULAIR) 10 MG tablet Take 1 tablet (10 mg total) by mouth at bedtime. 30 tablet 11   simvastatin (ZOCOR) 40 MG tablet Take 1 tablet (40 mg total) by mouth at bedtime. (Patient taking differently: Take 40 mg by mouth daily at 6 PM.) 90 tablet 3   SYMBICORT 160-4.5 MCG/ACT inhaler Inhale 2 puffs into the lungs 2 (two) times daily.     ferrous sulfate 325 (65 FE) MG tablet Take 1 tablet (325 mg total) by mouth daily. 30 tablet 3   azithromycin (ZITHROMAX) 250 MG tablet Take as directed 6 tablet 0   predniSONE (DELTASONE) 10 MG tablet Take 4 tablets (40 mg total) by mouth daily with breakfast. 20 tablet 0   Review of Systems  Constitutional:  Negative for chills, fever, malaise/fatigue and weight loss.  HENT:  Positive for congestion. Negative for sinus pain and sore throat.   Eyes: Negative.   Respiratory:  Positive for cough, sputum production, shortness of breath and wheezing. Negative for hemoptysis.   Cardiovascular:  Negative for chest pain, palpitations, orthopnea, claudication and leg swelling.  Gastrointestinal:  Negative for abdominal pain, heartburn, nausea and vomiting.  Genitourinary: Negative.   Musculoskeletal:  Negative for joint pain and myalgias.  Skin:  Negative for rash.  Neurological:  Negative for weakness and headaches.  Endo/Heme/Allergies: Negative.   Psychiatric/Behavioral: Negative.     Objective:    Vitals:   05/12/22 1228  BP: 124/68  Pulse: 84  SpO2: 94%  Weight: 153 lb 9.6 oz (69.7 kg)  Height: 5\' 5"  (1.651 m)   Physical Exam Constitutional:      General: She is not in acute distress.    Appearance: She is not ill-appearing.  HENT:     Head: Normocephalic and atraumatic.  Eyes:     General: No scleral icterus.    Conjunctiva/sclera: Conjunctivae normal.  Cardiovascular:     Rate and Rhythm: Normal rate and regular rhythm.     Pulses: Normal pulses.     Heart sounds: Normal heart sounds. No murmur heard. Pulmonary:     Effort: Pulmonary effort is normal.     Breath sounds: Wheezing and rhonchi present. No rales.  Musculoskeletal:     Right lower leg:  No edema.     Left lower leg: No edema.  Skin:    General: Skin is warm and dry.  Neurological:     General: No focal deficit present.     Mental Status: She is alert.  Psychiatric:        Mood and Affect: Mood normal.        Behavior: Behavior normal.        Thought Content: Thought content normal.        Judgment: Judgment normal.   CBC    Component Value Date/Time   WBC 9.9 05/19/2022 0345   RBC 3.86 (L) 05/19/2022 0345   HGB 8.7 (L) 05/19/2022 0345   HCT 29.5 (L) 05/19/2022 0345   PLT 393 05/19/2022 0345   MCV 76.4 (L) 05/19/2022 0345   MCH 22.5 (L) 05/19/2022 0345   MCHC 29.5 (L) 05/19/2022 0345   RDW 17.2 (H) 05/19/2022 0345   LYMPHSABS 1.1 10/31/2021 0919   MONOABS 0.6 10/31/2021 0919   EOSABS 0.2 10/31/2021 0919   BASOSABS 0.0 10/31/2021 0919      Latest Ref Rng & Units 05/19/2022    3:45 AM 05/18/2022   10:10 AM 10/31/2021   11:31 AM  BMP  Glucose 70 - 99 mg/dL 245   809     BUN 8 - 23 mg/dL 16   18     Creatinine 0.44 - 1.00 mg/dL 9.83   3.82     Sodium 135 - 145 mmol/L 141   138   139    Potassium 3.5 - 5.1 mmol/L 4.4   4.2   3.7    Chloride 98 - 111 mmol/L 105   104     CO2 22 - 32 mmol/L 29   24     Calcium 8.9 - 10.3 mg/dL 9.3   9.9      Chest imaging: CTA Chest PE 10/31/21 1.   No evidence of pulmonary embolism. 2. Low lung volumes with chronic bibasilar volume loss and areas of atelectasis/scarring. Mosaic attenuation is indicative of air trapping. 3. Debris in the esophagus, suggesting dysmotility or gastroesophageal reflux. 4. Age advanced coronary artery atherosclerosis. Recommend assessment of coronary risk factors and consideration of medical therapy. 5. Aortic atherosclerosis  PFT:    Latest Ref Rng & Units 05/12/2022   10:47 AM  PFT Results  FVC-Pre L 1.67    FVC-Predicted Pre % 51    FVC-Post L 1.72    FVC-Predicted Post % 52    Pre FEV1/FVC % % 77    Post FEV1/FCV % % 79    FEV1-Pre L 1.28    FEV1-Predicted Pre % 51    FEV1-Post L 1.36    DLCO uncorrected ml/min/mmHg 8.38    DLCO UNC% % 41    DLCO corrected ml/min/mmHg 8.38    DLCO COR %Predicted % 41    DLVA Predicted % 56    TLC L 4.06    TLC % Predicted % 78    RV % Predicted % 104      Labs:  Path:  Echo:  Heart Catheterization:  Assessment & Plan:   COPD with acute exacerbation (HCC) - Plan: predniSONE (DELTASONE) 10 MG tablet, azithromycin (ZITHROMAX) 250 MG tablet, Fluticasone-Umeclidin-Vilant (TRELEGY ELLIPTA) 100-62.5-25 MCG/ACT AEPB  Former smoker  Discussion: Breanna Ramirez is a 66 year old woman, former smoker with COPD and GERD who returns to pulmonary clinic for COPD.   Patient has acute COPD exacerbation with wheezing on exam.  She  is to take prednisone taper over next 12 days with Z-Pak.  She is to start trelegy ellipta 1 puff daily and stop  Symbicort inhaler. I have also recommended that she use DuoNeb nebulizer treatment at bedtime and every 6 hours as needed.  She is to continue Singulair 10 mg daily for seasonal allergies.  Follow up in 3 months.  Melody ComasJonathan Cheray Pardi, MD [facility] Office: [phone]  No current facility-administered medications for this visit. No current outpatient medications on file.  Facility-Administered  Medications Ordered in Other Visits:    acetaminophen (TYLENOL) tablet 650 mg, 650 mg, Oral, Q6H PRN, 650 mg at 05/19/22 2101 **OR** acetaminophen (TYLENOL) suppository 650 mg, 650 mg, Rectal, Q6H PRN, Bobette Mortiz, David Manuel, MD   azithromycin [facility](ZITHROMAX) tablet 500 mg, 500 mg, Oral, Daily, Regalado, Belkys A, MD, 500 mg at 05/20/22 0840   cefTRIAXone (ROCEPHIN) 1 g in sodium chloride 0.9 % 100 mL IVPB, 1 g, Intravenous, Q24H, Regalado, Belkys A, MD, Last Rate: 200 mL/hr at 05/20/22 1042, 1 g at 05/20/22 1042   enoxaparin (LOVENOX) injection 40 mg, 40 mg, Subcutaneous, Q24H, Regalado, Belkys A, MD, 40 mg at 05/19/22 1657   ferrous sulfate tablet 325 mg, 325 mg, Oral, Q breakfast, Bobette Mortiz, David Manuel, MD, 325 mg at 05/20/22 0840   fluticasone furoate-vilanterol (BREO ELLIPTA) 100-25 MCG/ACT 1 puff, 1 puff, Inhalation, Daily, 1 puff at 05/20/22 0803 **AND** umeclidinium bromide (INCRUSE ELLIPTA) 62.5 MCG/ACT 1 puff, 1 puff, Inhalation, Daily, Regalado, Belkys A, MD, 1 puff at 05/20/22 0803   guaiFENesin (MUCINEX) 12 hr tablet 1,200 mg, 1,200 mg, Oral, BID, Regalado, Belkys A, MD, 1,200 mg at 05/20/22 0840   guaiFENesin (ROBITUSSIN) 100 MG/5ML liquid 5 mL, 5 mL, Oral, Q4H PRN, Bobette Mortiz, David Manuel, MD, 5 mL at 05/20/22 0446   hydrochlorothiazide (HYDRODIURIL) tablet 25 mg, 25 mg, Oral, Daily, Bobette Mortiz, David Manuel, MD, 25 mg at 05/20/22 0840   ipratropium-albuterol (DUONEB) 0.5-2.5 (3) MG/3ML nebulizer solution 3 mL, 3 mL, Nebulization, TID, Bobette Mortiz, David Manuel, MD, 3 mL at 05/20/22 1347   MEDLINE mouth rinse, 15 mL, Mouth Rinse, BID, Bobette Mortiz, David Manuel, MD, 15 mL at 05/20/22 0840   methylPREDNISolone sodium succinate (SOLU-MEDROL) 40 mg/mL injection 40 mg, 40 mg, Intravenous, Q12H, Regalado, Belkys A, MD, 40 mg at 05/20/22 0840   montelukast (SINGULAIR) tablet 10 mg, 10 mg, Oral, QHS, Bobette Mortiz, David Manuel, MD, 10 mg at 05/19/22 2102   ondansetron (ZOFRAN) tablet 4 mg, 4 mg, Oral, Q6H PRN **OR** ondansetron  (ZOFRAN) injection 4 mg, 4 mg, Intravenous, Q6H PRN, Bobette Mortiz, David Manuel, MD   pantoprazole (PROTONIX) EC tablet 40 mg, 40 mg, Oral, BID, Regalado, Belkys A, MD, 40 mg at 05/20/22 0840   simvastatin (ZOCOR) tablet 40 mg, 40 mg, Oral, q1800, Bobette Mortiz, David Manuel, MD, 40 mg at 05/19/22 1657

## 2022-05-15 ENCOUNTER — Telehealth: Payer: Self-pay | Admitting: Pulmonary Disease

## 2022-05-15 NOTE — Telephone Encounter (Signed)
Called patient but she did not answer. Left message for her to call back.  

## 2022-05-16 NOTE — Telephone Encounter (Signed)
Patient is returning phone call. Patient phone number is [phone].

## 2022-05-17 ENCOUNTER — Telehealth: Payer: Self-pay | Admitting: Pulmonary Disease

## 2022-05-17 NOTE — Telephone Encounter (Signed)
Weekend Call Note Patient called with complaints of on going wheezing, cough and dyspnea. She is having night time awakenings due to shortness of breath and wheezing with SpO2 in the 80s right after she wakes up. Her SpO2 remains in the 90s during the day when awake.   She was started on oral prednisone taper and Zpak on 5/15. She has been using Trelegy Ellipta daily.  Recommendations: - I instructed her to go to the ER for further evaluation this weekend of her COPD - She will need to be evaluated for nocturnal hypoxemia and possible sleep disordered breathing in the future  Melody Comas, MD [facility] Office: [phone]   See Amion for personal pager PCCM on call pager [phone] until 7pm. Please call Elink 7p-7a. [phone]

## 2022-05-18 ENCOUNTER — Emergency Department (HOSPITAL_COMMUNITY): Payer: BC Managed Care – PPO

## 2022-05-18 ENCOUNTER — Other Ambulatory Visit: Payer: Self-pay

## 2022-05-18 ENCOUNTER — Inpatient Hospital Stay (HOSPITAL_COMMUNITY)
Admission: EM | Admit: 2022-05-18 | Discharge: 2022-05-21 | DRG: 190 | Disposition: A | Discharge status: Home / Self Care | Payer: BC Managed Care – PPO | Attending: Student | Admitting: Student

## 2022-05-18 ENCOUNTER — Encounter (HOSPITAL_COMMUNITY): Payer: Self-pay | Admitting: Emergency Medicine

## 2022-05-18 DIAGNOSIS — Z20822 Contact with and (suspected) exposure to covid-19: Secondary | ICD-10-CM | POA: Diagnosis present

## 2022-05-18 DIAGNOSIS — R0602 Shortness of breath: Secondary | ICD-10-CM | POA: Diagnosis not present

## 2022-05-18 DIAGNOSIS — E785 Hyperlipidemia, unspecified: Secondary | ICD-10-CM | POA: Diagnosis not present

## 2022-05-18 DIAGNOSIS — D75839 Thrombocytosis, unspecified: Secondary | ICD-10-CM | POA: Diagnosis present

## 2022-05-18 DIAGNOSIS — R062 Wheezing: Secondary | ICD-10-CM | POA: Diagnosis not present

## 2022-05-18 DIAGNOSIS — Z888 Allergy status to other drugs, medicaments and biological substances status: Secondary | ICD-10-CM | POA: Diagnosis not present

## 2022-05-18 DIAGNOSIS — D75838 Other thrombocytosis: Secondary | ICD-10-CM | POA: Diagnosis present

## 2022-05-18 DIAGNOSIS — D72829 Elevated white blood cell count, unspecified: Secondary | ICD-10-CM | POA: Diagnosis present

## 2022-05-18 DIAGNOSIS — Z7951 Long term (current) use of inhaled steroids: Secondary | ICD-10-CM

## 2022-05-18 DIAGNOSIS — I10 Essential (primary) hypertension: Secondary | ICD-10-CM | POA: Diagnosis present

## 2022-05-18 DIAGNOSIS — R000 Tachycardia, unspecified: Secondary | ICD-10-CM | POA: Diagnosis not present

## 2022-05-18 DIAGNOSIS — J9601 Acute respiratory failure with hypoxia: Secondary | ICD-10-CM | POA: Diagnosis present

## 2022-05-18 DIAGNOSIS — R0902 Hypoxemia: Secondary | ICD-10-CM

## 2022-05-18 DIAGNOSIS — K5900 Constipation, unspecified: Secondary | ICD-10-CM | POA: Diagnosis not present

## 2022-05-18 DIAGNOSIS — Z885 Allergy status to narcotic agent status: Secondary | ICD-10-CM | POA: Diagnosis not present

## 2022-05-18 DIAGNOSIS — T380X5A Adverse effect of glucocorticoids and synthetic analogues, initial encounter: Secondary | ICD-10-CM | POA: Diagnosis not present

## 2022-05-18 DIAGNOSIS — K219 Gastro-esophageal reflux disease without esophagitis: Secondary | ICD-10-CM | POA: Diagnosis not present

## 2022-05-18 DIAGNOSIS — D509 Iron deficiency anemia, unspecified: Secondary | ICD-10-CM | POA: Diagnosis present

## 2022-05-18 DIAGNOSIS — Z8701 Personal history of pneumonia (recurrent): Secondary | ICD-10-CM

## 2022-05-18 DIAGNOSIS — Z79899 Other long term (current) drug therapy: Secondary | ICD-10-CM | POA: Diagnosis not present

## 2022-05-18 DIAGNOSIS — Z87891 Personal history of nicotine dependence: Secondary | ICD-10-CM | POA: Diagnosis not present

## 2022-05-18 DIAGNOSIS — J441 Chronic obstructive pulmonary disease with (acute) exacerbation: Principal | ICD-10-CM | POA: Diagnosis present

## 2022-05-18 DIAGNOSIS — J8410 Pulmonary fibrosis, unspecified: Secondary | ICD-10-CM | POA: Diagnosis present

## 2022-05-18 LAB — RESPIRATORY PANEL BY PCR

## 2022-05-18 LAB — BASIC METABOLIC PANEL
Anion gap: 10 (ref 5–15)
BUN: 18 mg/dL (ref 8–23)
CO2: 24 mmol/L (ref 22–32)
Calcium: 9.9 mg/dL (ref 8.9–10.3)
Chloride: 104 mmol/L (ref 98–111)
Creatinine, Ser: 0.86 mg/dL (ref 0.44–1.00)
GFR, Estimated: >60 mL/min (ref >60)
Glucose, Bld: 119 mg/dL — ABNORMAL HIGH (ref 70–99)
Potassium: 4.2 mmol/L (ref 3.5–5.1)
Sodium: 138 mmol/L (ref 135–145)

## 2022-05-18 LAB — RESP PANEL BY RT-PCR (FLU A&B, COVID) ARPGX2
Influenza A by PCR: NEGATIVE
Influenza B by PCR: NEGATIVE
SARS Coronavirus 2 by RT PCR: NEGATIVE

## 2022-05-18 LAB — CBC
HCT: 33.1 % — ABNORMAL LOW (ref 36.0–46.0)
Hemoglobin: 9.6 g/dL — ABNORMAL LOW (ref 12.0–15.0)
MCH: 22.3 pg — ABNORMAL LOW (ref 26.0–34.0)
MCHC: 29 g/dL — ABNORMAL LOW (ref 30.0–36.0)
MCV: 77 fL — ABNORMAL LOW (ref 80.0–100.0)
Platelets: 473 10*3/uL — ABNORMAL HIGH (ref 150–400)
RBC: 4.3 MIL/uL (ref 3.87–5.11)
RDW: 17.4 % — ABNORMAL HIGH (ref 11.5–15.5)
WBC: 13.5 10*3/uL — ABNORMAL HIGH (ref 4.0–10.5)
nRBC: 0 % (ref 0.0–0.2)

## 2022-05-18 LAB — PHOSPHORUS: Phosphorus: 3.1 mg/dL (ref 2.5–4.6)

## 2022-05-18 LAB — TROPONIN I (HIGH SENSITIVITY)
Troponin I (High Sensitivity): 3 ng/L (ref <18)
Troponin I (High Sensitivity): 3 ng/L (ref <18)

## 2022-05-18 LAB — D-DIMER, QUANTITATIVE: D-Dimer, Quant: <0.27 ug/mL-FEU (ref 0.00–0.50)

## 2022-05-18 LAB — BRAIN NATRIURETIC PEPTIDE: B Natriuretic Peptide: 80.4 pg/mL (ref 0.0–100.0)

## 2022-05-18 MED ORDER — ALBUTEROL SULFATE (2.5 MG/3ML) 0.083% IN NEBU
2.5000 mg | INHALATION_SOLUTION | Freq: Once | RESPIRATORY_TRACT | Status: AC
  Administered 2022-05-18: 2.5 mg via RESPIRATORY_TRACT
  Filled 2022-05-18: qty 3

## 2022-05-18 MED ORDER — SIMVASTATIN 40 MG PO TABS
40.0000 mg | ORAL_TABLET | Freq: Every day | ORAL | Status: DC
  Administered 2022-05-19 – 2022-05-20 (×2): 40 mg via ORAL
  Filled 2022-05-18 (×2): qty 1

## 2022-05-18 MED ORDER — ASPIRIN 325 MG PO TABS: 325.0000 mg | ORAL_TABLET | Freq: Every day | ORAL | Status: DC | PRN

## 2022-05-18 MED ORDER — MAGNESIUM SULFATE 2 GM/50ML IV SOLN
2.0000 g | Freq: Once | INTRAVENOUS | Status: AC
  Administered 2022-05-18: 2 g via INTRAVENOUS
  Filled 2022-05-18: qty 50

## 2022-05-18 MED ORDER — PANTOPRAZOLE SODIUM 40 MG PO TBEC
40.0000 mg | DELAYED_RELEASE_TABLET | Freq: Every day | ORAL | Status: DC
  Administered 2022-05-18 – 2022-05-19 (×2): 40 mg via ORAL
  Filled 2022-05-18 (×2): qty 1

## 2022-05-18 MED ORDER — AZITHROMYCIN 250 MG PO TABS
500.0000 mg | ORAL_TABLET | Freq: Once | ORAL | Status: AC
  Administered 2022-05-18: 500 mg via ORAL
  Filled 2022-05-18: qty 2

## 2022-05-18 MED ORDER — ACETAMINOPHEN 325 MG PO TABS
650.0000 mg | ORAL_TABLET | Freq: Four times a day (QID) | ORAL | Status: DC | PRN
  Administered 2022-05-19: 650 mg via ORAL
  Filled 2022-05-18: qty 2

## 2022-05-18 MED ORDER — ONDANSETRON HCL 4 MG PO TABS: 4.0000 mg | ORAL_TABLET | Freq: Four times a day (QID) | ORAL | Status: DC | PRN

## 2022-05-18 MED ORDER — IPRATROPIUM BROMIDE 0.02 % IN SOLN
0.5000 mg | Freq: Once | RESPIRATORY_TRACT | Status: AC
  Administered 2022-05-18: 0.5 mg via RESPIRATORY_TRACT
  Filled 2022-05-18: qty 2.5

## 2022-05-18 MED ORDER — FERROUS SULFATE 325 (65 FE) MG PO TABS
325.0000 mg | ORAL_TABLET | Freq: Every day | ORAL | Status: DC
  Administered 2022-05-19 – 2022-05-21 (×3): 325 mg via ORAL
  Filled 2022-05-18 (×3): qty 1

## 2022-05-18 MED ORDER — ACETAMINOPHEN 650 MG RE SUPP: 650.0000 mg | Freq: Four times a day (QID) | RECTAL | Status: DC | PRN

## 2022-05-18 MED ORDER — PREDNISONE 20 MG PO TABS
40.0000 mg | ORAL_TABLET | Freq: Every day | ORAL | Status: DC
  Administered 2022-05-19: 40 mg via ORAL
  Filled 2022-05-18: qty 2

## 2022-05-18 MED ORDER — ONDANSETRON HCL 4 MG/2ML IJ SOLN: 4.0000 mg | Freq: Four times a day (QID) | INTRAMUSCULAR | Status: DC | PRN

## 2022-05-18 MED ORDER — SODIUM CHLORIDE 0.9 % IV SOLN
1.0000 g | Freq: Once | INTRAVENOUS | Status: AC
  Administered 2022-05-18: 1 g via INTRAVENOUS
  Filled 2022-05-18: qty 10

## 2022-05-18 MED ORDER — ORAL CARE MOUTH RINSE
15.0000 mL | Freq: Two times a day (BID) | OROMUCOSAL | Status: DC
  Administered 2022-05-18 – 2022-05-20 (×5): 15 mL via OROMUCOSAL

## 2022-05-18 MED ORDER — MONTELUKAST SODIUM 10 MG PO TABS
10.0000 mg | ORAL_TABLET | Freq: Every day | ORAL | Status: DC
  Administered 2022-05-18 – 2022-05-20 (×3): 10 mg via ORAL
  Filled 2022-05-18 (×3): qty 1

## 2022-05-18 MED ORDER — IPRATROPIUM-ALBUTEROL 0.5-2.5 (3) MG/3ML IN SOLN
3.0000 mL | Freq: Four times a day (QID) | RESPIRATORY_TRACT | Status: DC
  Administered 2022-05-18: 3 mL via RESPIRATORY_TRACT
  Filled 2022-05-18: qty 3

## 2022-05-18 MED ORDER — IPRATROPIUM-ALBUTEROL 0.5-2.5 (3) MG/3ML IN SOLN
3.0000 mL | Freq: Three times a day (TID) | RESPIRATORY_TRACT | Status: DC
  Administered 2022-05-19 – 2022-05-21 (×8): 3 mL via RESPIRATORY_TRACT
  Filled 2022-05-18 (×8): qty 3

## 2022-05-18 MED ORDER — METHYLPREDNISOLONE SODIUM SUCC 125 MG IJ SOLR
125.0000 mg | Freq: Every day | INTRAMUSCULAR | Status: AC
  Administered 2022-05-18: 125 mg via INTRAVENOUS
  Filled 2022-05-18: qty 2

## 2022-05-18 MED ORDER — HYDROCHLOROTHIAZIDE 25 MG PO TABS
25.0000 mg | ORAL_TABLET | Freq: Every day | ORAL | Status: DC
  Administered 2022-05-19 – 2022-05-21 (×3): 25 mg via ORAL
  Filled 2022-05-18 (×3): qty 1

## 2022-05-18 NOTE — ED Provider Notes (Signed)
[facility]-EMERGENCY DEPT Provider Note   CSN: [id number] Arrival date & time: 05/18/22  8101     History  Chief Complaint  Patient presents with   Shortness of Breath    Breanna Ramirez is a 66 y.o. female.  The history is provided by the patient and medical records. No language interpreter was used.  Shortness of Breath  66 year old female significant history of COPD, hypertension, recurrent pneumonia, pulmonary fibrosis, microcytic anemia, presenting complaining of shortness of breath.  Patient report for the past week she has had progressive worsening shortness of breath.  States she is having shortness of breath, wheezing, cough that does not get any better.  Worsening symptoms at nighttime sometimes waking up trying to catch her breath.  No fever chest pain hemoptysis reported.  She has noticed some fluid gain in her legs.  She was seen by her pulmonologist a week ago for symptoms, was prescribed prednisone, and Z-Pak which she took for the full duration but states her symptom has not resolved.  She reached out to her doctor and was recommended to come to the ER for further assessment.  She has finished an oral present taper and Z-Pak.  She is currently using Trelegy Ellipta daily     Home Medications Prior to Admission medications   Medication Sig Start Date End Date Taking? Authorizing Provider  albuterol (PROVENTIL HFA;VENTOLIN HFA) 108 (90 Base) MCG/ACT inhaler Inhale 1-2 puffs into the lungs every 6 (six) hours as needed for wheezing or shortness of breath.    [provider]  aspirin 325 MG tablet Take 325 mg by mouth daily as needed for mild pain.    [provider]  azithromycin (ZITHROMAX) 250 MG tablet Take as directed 05/12/22   Martina Sinner, MD  esomeprazole (NEXIUM) 40 MG capsule Take 1 capsule (40 mg total) by mouth daily. 09/13/14   Jannifer Rodney A, FNP  ferrous sulfate 325 (65 FE) MG tablet Take 1 tablet (325 mg total)  by mouth daily. 10/23/18 02/12/22  Marinda Elk, MD  fluticasone (FLONASE) 50 MCG/ACT nasal spray Place 1 spray into both nostrils 2 (two) times daily. 09/30/21   [provider]  Fluticasone-Umeclidin-Vilant (TRELEGY ELLIPTA) 100-62.5-25 MCG/ACT AEPB Inhale 1 puff into the lungs daily. 05/12/22   Martina Sinner, MD  hydrochlorothiazide (HYDRODIURIL) 25 MG tablet Take 1 tablet (25 mg total) by mouth daily. 09/13/14   Jannifer Rodney A, FNP  ipratropium-albuterol (DUONEB) 0.5-2.5 (3) MG/3ML SOLN SMARTSIG:1 Ampule(s) Via Nebulizer 3 Times Daily PRN 02/12/22   Martina Sinner, MD  montelukast (SINGULAIR) 10 MG tablet Take 1 tablet (10 mg total) by mouth at bedtime. 02/12/22   Martina Sinner, MD  predniSONE (DELTASONE) 10 MG tablet Take 4 tablets (40 mg total) by mouth daily with breakfast for 3 days, THEN 3 tablets (30 mg total) daily with breakfast for 3 days, THEN 2 tablets (20 mg total) daily with breakfast for 3 days, THEN 1 tablet (10 mg total) daily with breakfast for 3 days. 05/12/22 05/24/22  Martina Sinner, MD  simvastatin (ZOCOR) 40 MG tablet Take 1 tablet (40 mg total) by mouth at bedtime. 09/15/14   Junie Spencer, FNP      Allergies    Prednisone and Codeine    Review of Systems   Review of Systems  Respiratory:  Positive for shortness of breath.   All other systems reviewed and are negative.  Physical Exam Updated Vital Signs BP 132/68  Pulse 100   Temp 98.2 F (36.8 C) (Oral)   Resp 16   SpO2 92%  Physical Exam Vitals and nursing note reviewed.  Constitutional:      General: She is not in acute distress.    Appearance: She is well-developed.  HENT:     Head: Atraumatic.  Eyes:     Conjunctiva/sclera: Conjunctivae normal.  Neck:     Vascular: JVD present.  Cardiovascular:     Rate and Rhythm: Regular rhythm. Tachycardia present.  Pulmonary:     Effort: Pulmonary effort is normal.     Breath sounds: Wheezing present. No rhonchi or rales.   Chest:     Chest wall: No tenderness.  Musculoskeletal:     Cervical back: Neck supple.     Comments: Trace edema noted to bilateral lower extremities  Skin:    Findings: No rash.  Neurological:     Mental Status: She is alert and oriented to person, place, and time.  Psychiatric:        Mood and Affect: Mood normal.    ED Results / Procedures / Treatments   Labs (all labs ordered are listed, but only abnormal results are displayed) Labs Reviewed  BASIC METABOLIC PANEL - Abnormal; Notable for the following components:      Result Value   Glucose, Bld 119 (*)    All other components within normal limits  CBC - Abnormal; Notable for the following components:   WBC 13.5 (*)    Hemoglobin 9.6 (*)    HCT 33.1 (*)    MCV 77.0 (*)    MCH 22.3 (*)    MCHC 29.0 (*)    RDW 17.4 (*)    Platelets 473 (*)    All other components within normal limits  RESP PANEL BY RT-PCR (FLU A&B, COVID) ARPGX2  RESPIRATORY PANEL BY PCR  BRAIN NATRIURETIC PEPTIDE  D-DIMER, QUANTITATIVE  PHOSPHORUS  TROPONIN I (HIGH SENSITIVITY)  TROPONIN I (HIGH SENSITIVITY)    EKG None ED ECG REPORT   Date: 05/18/2022  Rate: 117  Rhythm: sinus tachycardia  QRS Axis: normal  Intervals: normal  ST/T Wave abnormalities: normal  Conduction Disutrbances:none  Narrative Interpretation:   Old EKG Reviewed: unchanged  I have personally reviewed the EKG tracing and agree with the computerized printout as noted.   Radiology DG Chest 2 View  Result Date: 05/18/2022 CLINICAL DATA:  Shortness of breath.  Wheezing. EXAM: CHEST - 2 VIEW COMPARISON:  October 31, 2021 FINDINGS: Cardiomediastinal silhouette is stable. No pneumothorax. Blunting of the costophrenic angles unchanged since September 25, 2020. Emphysematous changes identified in the lungs. Interstitial prominence is similar in the interval. Chronic opacity in the lingula and right middle lobe was noted to represent atelectasis on the October 31, 2021 CT  scan. The opacity in the right base is similar in appearance compared to September 25, 2020. IMPRESSION: Chronic changes in the lungs as above. No definite acute abnormality. It would be difficult to assess for a subtle acute on chronic opacity. Electronically Signed   By: Gerome Sam III M.D.   On: 05/18/2022 10:41    Procedures .Critical Care Performed by: Fayrene Helper, PA-C Authorized by: Fayrene Helper, PA-C   Critical care provider statement:    Critical care time (minutes):  30   Critical care was time spent personally by me on the following activities:  Development of treatment plan with patient or surrogate, discussions with consultants, evaluation of patient's response to treatment, examination of patient, ordering  and review of laboratory studies, ordering and review of radiographic studies, ordering and performing treatments and interventions, pulse oximetry, re-evaluation of patient's condition and review of old charts    Medications Ordered in ED Medications  magnesium sulfate IVPB 2 g 50 mL (2 g Intravenous New Bag/Given 05/18/22 1231)  methylPREDNISolone sodium succinate (SOLU-MEDROL) 125 mg/2 mL injection 125 mg (has no administration in time range)    Followed by  predniSONE (DELTASONE) tablet 40 mg (has no administration in time range)  ondansetron (ZOFRAN) tablet 4 mg (has no administration in time range)    Or  ondansetron (ZOFRAN) injection 4 mg (has no administration in time range)  acetaminophen (TYLENOL) tablet 650 mg (has no administration in time range)    Or  acetaminophen (TYLENOL) suppository 650 mg (has no administration in time range)  albuterol (PROVENTIL) (2.5 MG/3ML) 0.083% nebulizer solution 2.5 mg (2.5 mg Nebulization Given 05/18/22 1026)  ipratropium (ATROVENT) nebulizer solution 0.5 mg (0.5 mg Nebulization Given 05/18/22 1026)  cefTRIAXone (ROCEPHIN) 1 g in sodium chloride 0.9 % 100 mL IVPB (0 g Intravenous Stopped 05/18/22 1231)  azithromycin (ZITHROMAX)  tablet 500 mg (500 mg Oral Given 05/18/22 1147)    ED Course/ Medical Decision Making/ A&P                           Medical Decision Making  BP (!) 161/85 (BP Location: Left Arm)   Pulse (!) 114   Temp 98.2 F (36.8 C) (Oral)   Resp 20   SpO2 95%   10:18 AM This is a 66 year old female with significant history of pulmonary fibrosis, hypertension, GERD, who presents with complaints of worsening shortness of breath and wheezing for the past week not adequately controlled despite taking prednisone and Z-Pak that was prescribed to her by her pulmonologist a week prior.  Patient is mildly tachycardic on initial exam.  She does have inspiratory and expiratory wheezes noted.  She is satting at 95% on room air while at rest.  We will give hour-long nebs treatment and will reassess.  11:37 AM Labs, EKG, and chest x-ray independently reviewed and interpreted by me and I agree with radiologist interpretation.  Labs remarkable for an elevated white count of 13.5, this is likely secondary to recent steroid use however infection cannot be completely ruled out.  Normal BNP.  Chest x-ray shows chronic changes in her lungs but radiologist states it is difficult to assess for Acute on chronic opacity.  Since patient still have persistence of symptoms, will also initiate antibiotic to cover which potential pneumonia.  Patient received hour-long treatment, however on ambulation, O2 sats drops to 86% on room air.  I will initiate further treatment with magnesium infusion and will consult for admission.   12:33 PM Appreciate consultation from [facility] hospitalist, Dr. Robb Matar who agrees to see patient and will admit for further management of his COPD exacerbation with acute respiratory failure.  This patient presents to the ED for concern of sob, this involves an extensive number of treatment options, and is a complaint that carries with it a high risk of complications and morbidity.  The differential diagnosis  includes copd exacerbation, asthma, pneumonia, restrictive airway disease, viral pneumonitis  Co morbidities that complicate the patient evaluation COPD Additional history obtained:  Additional history obtained from patient External records from outside source obtained and reviewed including pulmonary specialist  Lab Tests:  I Ordered, and personally interpreted labs.  The pertinent results include:  as above  Imaging Studies ordered:  I ordered imaging studies including CXR I independently visualized and interpreted imaging which showed chronic lung changes, can't rule out focal infiltrates I agree with the radiologist interpretation  Cardiac Monitoring:  The patient was maintained on a cardiac monitor.  I personally viewed and interpreted the cardiac monitored which showed an underlying rhythm of: sinus tachycardia  Medicines ordered and prescription drug management:  I ordered medication including albuterol/atrovent/mag  for copd exacerbation Reevaluation of the patient after these medicines showed that the patient improved I have reviewed the patients home medicines and have made adjustments as needed  Test Considered: as above  Critical Interventions: supplemental O2  Hour long albuterol nebs  Consultations Obtained:  I requested consultation with the [facility] Hospitalist Dr. Robb Matarrtiz,  and discussed lab and imaging findings as well as pertinent plan - they recommend: admission  Problem List / ED Course: COPD exacerbation  Reevaluation:  After the interventions noted above, I reevaluated the patient and found that they have :stayed the same  Social Determinants of Health: tobacco use  Dispostion:  After consideration of the diagnostic results and the patients response to treatment, I feel that the patent would benefit from admission.         Final Clinical Impression(s) / ED Diagnoses Final diagnoses:  COPD exacerbation [facility](HCC)  Hypoxia    Rx / DC  Orders ED Discharge Orders     None         Fayrene Helperran, Melburn Treiber, PA-C 05/18/22 1512    Milagros Lollykstra, Richard S, MD 05/18/22 [phone]

## 2022-05-18 NOTE — ED Notes (Signed)
Pt walked lap around unit. SPO2 dropped to 86.

## 2022-05-18 NOTE — ED Triage Notes (Signed)
Patient c/o SOB and wheezing x1 week. Reports hx of COPD. States using inhaler with little relief. Denies chest pain. Reports upper back pain.

## 2022-05-18 NOTE — H&P (Signed)
History and Physical    Patient: Breanna Ramirez LPF:[id number] DOB: 09/30/1956 DOA: 05/18/2022 DOS: the patient was seen and examined on 05/18/2022 PCP: Patient, No Pcp Per (Inactive)  Patient coming from: Home  Chief Complaint:  Chief Complaint  Patient presents with   Shortness of Breath   HPI: Breanna Ramirez is a 66 y.o. female with medical history significant of COPD, pulmonary fibrosis, chronic airflow limitation, history of pneumonia, history of chronic sinusitis who is coming to the emergency department due to progressively worse dyspnea associated with wheezing, fatigue and nonproductive cough.  No sick contacts or travel history.  She saw her pulmonologist on Monday, was prescribed prednisone and azithromycin without significant relief.  No fever, chills, night sweats, pleuritic chest pain, diaphoresis, PND, orthopnea or pitting edema lower extremities.  No abdominal pain, nausea, emesis, diarrhea, melena or hematochezia.  No flank pain, dysuria, frequency materia.  No polyuria, polydipsia, polyphagia or blurred vision.  ED course: Initial vital signs were temperature 98.2 F, pulse 114 constipation 20, BP 161/85 mmHg O2 sat 95% on room air.  However, some point the patient O2 sat decreased to 87% and she was placed on 2 L of nasal cannula.  She received albuterol 2.5 neb, magnesium sulfate 2 g IVPB azithromycin and 1 g of ceftriaxone.  I added Solu-Medrol 125 mg IVP.  Lab work: Respiratory virus pathogen and coronavirus/influenza PCR was negative.  CBC showed white count 13.5, hemoglobin 9.6 g/dL and platelets 532.  D-dimer was normal.  Troponin and BNP were negative.  BMP with glucose of 119 mg/dL, but otherwise normal.  Imaging: A 2 view chest radiograph showed chronic changes with no definite acute abnormality.  Please see images and full radiology report for further details.  Review of Systems: As mentioned in the history of present illness. All other systems reviewed and are  negative. Past Medical History:  Diagnosis Date   COPD (chronic obstructive pulmonary disease) (HCC)    Essential hypertension    GERD (gastroesophageal reflux disease)    Pneumonia 05/2017   Respiratory failure with hypoxia (HCC) 05/2017   Past Surgical History:  Procedure Laterality Date   ENDOMETRIAL ABLATION     Social History:  reports that she quit smoking about 4 years ago. Her smoking use included cigarettes. She has a 20.00 pack-year smoking history. She has never used smokeless tobacco. She reports that she does not drink alcohol and does not use drugs.  Allergies  Allergen Reactions   Prednisone Palpitations    Tolerates DepoMedrol well in oral and injectable.    Codeine Nausea And Vomiting    Family History  Problem Relation Age of Onset   Cancer Father     Prior to Admission medications   Medication Sig Start Date End Date Taking? Authorizing Provider  albuterol (PROVENTIL HFA;VENTOLIN HFA) 108 (90 Base) MCG/ACT inhaler Inhale 1-2 puffs into the lungs every 6 (six) hours as needed for wheezing or shortness of breath.    [provider]  aspirin 325 MG tablet Take 325 mg by mouth daily as needed for mild pain.    [provider]  azithromycin (ZITHROMAX) 250 MG tablet Take as directed 05/12/22   Martina Sinner, MD  esomeprazole (NEXIUM) 40 MG capsule Take 1 capsule (40 mg total) by mouth daily. 09/13/14   Jannifer Rodney A, FNP  ferrous sulfate 325 (65 FE) MG tablet Take 1 tablet (325 mg total) by mouth daily. 10/23/18 02/12/22  Marinda Elk, MD  fluticasone (FLONASE) 50  MCG/ACT nasal spray Place 1 spray into both nostrils 2 (two) times daily. 09/30/21   [provider]  Fluticasone-Umeclidin-Vilant (TRELEGY ELLIPTA) 100-62.5-25 MCG/ACT AEPB Inhale 1 puff into the lungs daily. 05/12/22   Martina Sinner, MD  hydrochlorothiazide (HYDRODIURIL) 25 MG tablet Take 1 tablet (25 mg total) by mouth daily. 09/13/14   Jannifer Rodney A, FNP   ipratropium-albuterol (DUONEB) 0.5-2.5 (3) MG/3ML SOLN SMARTSIG:1 Ampule(s) Via Nebulizer 3 Times Daily PRN 02/12/22   Martina Sinner, MD  montelukast (SINGULAIR) 10 MG tablet Take 1 tablet (10 mg total) by mouth at bedtime. 02/12/22   Martina Sinner, MD  predniSONE (DELTASONE) 10 MG tablet Take 4 tablets (40 mg total) by mouth daily with breakfast for 3 days, THEN 3 tablets (30 mg total) daily with breakfast for 3 days, THEN 2 tablets (20 mg total) daily with breakfast for 3 days, THEN 1 tablet (10 mg total) daily with breakfast for 3 days. 05/12/22 05/24/22  Martina Sinner, MD  simvastatin (ZOCOR) 40 MG tablet Take 1 tablet (40 mg total) by mouth at bedtime. 09/15/14   Junie Spencer, FNP    Physical Exam: Vitals:   05/18/22 1004 05/18/22 1117 05/18/22 1230  BP: (!) 161/85 133/75 132/68  Pulse: (!) 114 (!) 106 100  Resp: 20 13 16   Temp: 98.2 F (36.8 C)    TempSrc: Oral    SpO2: 95% 96% 92%   Physical Exam Vitals and nursing note reviewed.  Constitutional:      Appearance: She is well-developed.  HENT:     Head: Normocephalic.     Mouth/Throat:     Mouth: Mucous membranes are moist.  Eyes:     General: No scleral icterus.    Pupils: Pupils are equal, round, and reactive to light.  Neck:     Vascular: No JVD.  Cardiovascular:     Rate and Rhythm: Regular rhythm. Tachycardia present.     Heart sounds: S1 normal and S2 normal.  Pulmonary:     Effort: Tachypnea present.     Breath sounds: Decreased breath sounds, wheezing and rhonchi present. No rales.  Abdominal:     General: Bowel sounds are normal.     Palpations: Abdomen is soft.     Tenderness: There is no abdominal tenderness.  Musculoskeletal:     Cervical back: Neck supple.     Right lower leg: No edema.     Left lower leg: No edema.  Skin:    General: Skin is warm and dry.  Neurological:     General: No focal deficit present.     Mental Status: She is alert and oriented to person, place, and time.   Psychiatric:        Mood and Affect: Mood normal.        Behavior: Behavior normal.    Data Reviewed:  There are no new results to review at this time.  Assessment and Plan: Principal Problem:   COPD with acute exacerbation (HCC) Observation/telemetry Continue supplemental oxygen. Methylprednisolone 125 mg IVP x1. Followed by prednisone 40 mg p.o. daily in a.m. Scheduled and as needed bronchodilators. Continue montelukast 10 mg p.o. bedtime. Follow-up CBC and chemistry in the morning.   Active Problems:   G E R D Protonix 40 mg p.o. daily.    Hyperlipidemia Continue simvastatin 40 mg p.o. in the evening.    Essential hypertension, benign Continue HCTZ 25 mg p.o. daily. Monitor BP, renal function electrolytes.    Microcytic anemia Monitor  hematocrit and hemoglobin. Resume iron supplementation. Follow-up with primary care provider.    Thrombocytosis Today in the setting of chronic anemia. Monitor platelet count.    Leukocytosis Due to glucocorticoid use.     Advance Care Planning:   Code Status: Full Code   Consults:   Family Communication:   Severity of Illness: The appropriate patient status for this patient is OBSERVATION. Observation status is judged to be reasonable and necessary in order to provide the required intensity of service to ensure the patient's safety. The patient's presenting symptoms, physical exam findings, and initial radiographic and laboratory data in the context of their medical condition is felt to place them at decreased risk for further clinical deterioration. Furthermore, it is anticipated that the patient will be medically stable for discharge from the hospital within 2 midnights of admission.   Author: Bobette Moavid Manuel Janaiah Vetrano, MD 05/18/2022 12:50 PM  For on call review [email or web address].   This document was prepared in [employer] voice recognition software and may contain some unintended transcription errors.

## 2022-05-19 DIAGNOSIS — J9601 Acute respiratory failure with hypoxia: Secondary | ICD-10-CM | POA: Diagnosis present

## 2022-05-19 DIAGNOSIS — Z888 Allergy status to other drugs, medicaments and biological substances status: Secondary | ICD-10-CM | POA: Diagnosis not present

## 2022-05-19 DIAGNOSIS — I10 Essential (primary) hypertension: Secondary | ICD-10-CM | POA: Diagnosis present

## 2022-05-19 DIAGNOSIS — Z7951 Long term (current) use of inhaled steroids: Secondary | ICD-10-CM | POA: Diagnosis not present

## 2022-05-19 DIAGNOSIS — Z79899 Other long term (current) drug therapy: Secondary | ICD-10-CM | POA: Diagnosis not present

## 2022-05-19 DIAGNOSIS — Z885 Allergy status to narcotic agent status: Secondary | ICD-10-CM | POA: Diagnosis not present

## 2022-05-19 DIAGNOSIS — J8410 Pulmonary fibrosis, unspecified: Secondary | ICD-10-CM | POA: Diagnosis present

## 2022-05-19 DIAGNOSIS — T380X5A Adverse effect of glucocorticoids and synthetic analogues, initial encounter: Secondary | ICD-10-CM | POA: Diagnosis present

## 2022-05-19 DIAGNOSIS — Z8701 Personal history of pneumonia (recurrent): Secondary | ICD-10-CM | POA: Diagnosis not present

## 2022-05-19 DIAGNOSIS — E785 Hyperlipidemia, unspecified: Secondary | ICD-10-CM | POA: Diagnosis present

## 2022-05-19 DIAGNOSIS — Z87891 Personal history of nicotine dependence: Secondary | ICD-10-CM | POA: Diagnosis not present

## 2022-05-19 DIAGNOSIS — J441 Chronic obstructive pulmonary disease with (acute) exacerbation: Secondary | ICD-10-CM | POA: Diagnosis present

## 2022-05-19 DIAGNOSIS — Z20822 Contact with and (suspected) exposure to covid-19: Secondary | ICD-10-CM | POA: Diagnosis present

## 2022-05-19 DIAGNOSIS — D75838 Other thrombocytosis: Secondary | ICD-10-CM | POA: Diagnosis present

## 2022-05-19 DIAGNOSIS — R000 Tachycardia, unspecified: Secondary | ICD-10-CM | POA: Diagnosis present

## 2022-05-19 DIAGNOSIS — D509 Iron deficiency anemia, unspecified: Secondary | ICD-10-CM | POA: Diagnosis present

## 2022-05-19 DIAGNOSIS — D72829 Elevated white blood cell count, unspecified: Secondary | ICD-10-CM | POA: Diagnosis present

## 2022-05-19 DIAGNOSIS — K5900 Constipation, unspecified: Secondary | ICD-10-CM | POA: Diagnosis present

## 2022-05-19 DIAGNOSIS — K219 Gastro-esophageal reflux disease without esophagitis: Secondary | ICD-10-CM | POA: Diagnosis present

## 2022-05-19 LAB — CBC
HCT: 29.5 % — ABNORMAL LOW (ref 36.0–46.0)
Hemoglobin: 8.7 g/dL — ABNORMAL LOW (ref 12.0–15.0)
MCH: 22.5 pg — ABNORMAL LOW (ref 26.0–34.0)
MCHC: 29.5 g/dL — ABNORMAL LOW (ref 30.0–36.0)
MCV: 76.4 fL — ABNORMAL LOW (ref 80.0–100.0)
Platelets: 393 10*3/uL (ref 150–400)
RBC: 3.86 MIL/uL — ABNORMAL LOW (ref 3.87–5.11)
RDW: 17.2 % — ABNORMAL HIGH (ref 11.5–15.5)
WBC: 9.9 10*3/uL (ref 4.0–10.5)
nRBC: 0 % (ref 0.0–0.2)

## 2022-05-19 LAB — COMPREHENSIVE METABOLIC PANEL
ALT: 12 U/L (ref 0–44)
AST: 12 U/L — ABNORMAL LOW (ref 15–41)
Albumin: 3.4 g/dL — ABNORMAL LOW (ref 3.5–5.0)
Alkaline Phosphatase: 49 U/L (ref 38–126)
Anion gap: 7 (ref 5–15)
BUN: 16 mg/dL (ref 8–23)
CO2: 29 mmol/L (ref 22–32)
Calcium: 9.3 mg/dL (ref 8.9–10.3)
Chloride: 105 mmol/L (ref 98–111)
Creatinine, Ser: 0.74 mg/dL (ref 0.44–1.00)
GFR, Estimated: >60 mL/min (ref >60)
Glucose, Bld: 115 mg/dL — ABNORMAL HIGH (ref 70–99)
Potassium: 4.4 mmol/L (ref 3.5–5.1)
Sodium: 141 mmol/L (ref 135–145)
Total Bilirubin: 0.4 mg/dL (ref 0.3–1.2)
Total Protein: 6.2 g/dL — ABNORMAL LOW (ref 6.5–8.1)

## 2022-05-19 LAB — HIV ANTIBODY (ROUTINE TESTING W REFLEX): HIV Screen 4th Generation wRfx: NONREACTIVE

## 2022-05-19 MED ORDER — FLUTICASONE FUROATE-VILANTEROL 100-25 MCG/ACT IN AEPB
1.0000 | INHALATION_SPRAY | Freq: Every day | RESPIRATORY_TRACT | Status: DC
Start: 2022-05-19 — End: 2022-05-21
  Administered 2022-05-20 – 2022-05-21 (×2): 1 via RESPIRATORY_TRACT
  Filled 2022-05-19: qty 28

## 2022-05-19 MED ORDER — ENOXAPARIN SODIUM 40 MG/0.4ML IJ SOSY
40.0000 mg | PREFILLED_SYRINGE | INTRAMUSCULAR | Status: DC
Start: 2022-05-19 — End: 2022-05-21
  Administered 2022-05-19 – 2022-05-20 (×2): 40 mg via SUBCUTANEOUS
  Filled 2022-05-19 (×2): qty 0.4

## 2022-05-19 MED ORDER — AZITHROMYCIN 250 MG PO TABS
500.0000 mg | ORAL_TABLET | Freq: Every day | ORAL | Status: DC
  Administered 2022-05-19 – 2022-05-21 (×3): 500 mg via ORAL
  Filled 2022-05-19 (×3): qty 2

## 2022-05-19 MED ORDER — PANTOPRAZOLE SODIUM 40 MG PO TBEC
40.0000 mg | DELAYED_RELEASE_TABLET | Freq: Two times a day (BID) | ORAL | Status: DC
  Administered 2022-05-19 – 2022-05-21 (×4): 40 mg via ORAL
  Filled 2022-05-19 (×4): qty 1

## 2022-05-19 MED ORDER — UMECLIDINIUM BROMIDE 62.5 MCG/ACT IN AEPB
1.0000 | INHALATION_SPRAY | Freq: Every day | RESPIRATORY_TRACT | Status: DC
Start: 2022-05-19 — End: 2022-05-21
  Administered 2022-05-20 – 2022-05-21 (×2): 1 via RESPIRATORY_TRACT
  Filled 2022-05-19: qty 7

## 2022-05-19 MED ORDER — GUAIFENESIN ER 600 MG PO TB12
1200.0000 mg | ORAL_TABLET | Freq: Two times a day (BID) | ORAL | Status: DC
Start: 2022-05-19 — End: 2022-05-21
  Administered 2022-05-19 – 2022-05-21 (×5): 1200 mg via ORAL
  Filled 2022-05-19 (×5): qty 2

## 2022-05-19 MED ORDER — METHYLPREDNISOLONE SODIUM SUCC 40 MG IJ SOLR
40.0000 mg | Freq: Two times a day (BID) | INTRAMUSCULAR | Status: DC
  Administered 2022-05-19 – 2022-05-21 (×5): 40 mg via INTRAVENOUS
  Filled 2022-05-19 (×5): qty 1

## 2022-05-19 MED ORDER — GUAIFENESIN 100 MG/5ML PO LIQD
5.0000 mL | ORAL | Status: DC | PRN
  Administered 2022-05-19 – 2022-05-20 (×2): 5 mL via ORAL
  Filled 2022-05-19 (×2): qty 10

## 2022-05-19 MED ORDER — SODIUM CHLORIDE 0.9 % IV SOLN
1.0000 g | INTRAVENOUS | Status: DC
  Administered 2022-05-19 – 2022-05-20 (×2): 1 g via INTRAVENOUS
  Filled 2022-05-19 (×3): qty 10

## 2022-05-19 NOTE — Telephone Encounter (Signed)
Called patient and told her since she was admitted in the hospital right now for her to call the office after she is discharged to set up her hospital follow up and we can get her started on oxygen if she needs it. Nothing further needed

## 2022-05-19 NOTE — TOC Initial Note (Signed)
Transition of Care [facility]) - Initial/Assessment Note    Patient Details  Name: Lajeune Marchel MRN: [id number] Date of Birth: 1956-10-15  [facility] CM/SW Contact:    Leeroy Cha, RN Phone Number: 05/19/2022, 8:36 AM  Clinical Narrative:                  Transition of Care [facility]) Screening Note   Patient Details  Name: Jilda Berardinelli Date of Birth: [birth year]   Transition of Care [facility]) CM/SW Contact:    Leeroy Cha, RN Phone Number: 05/19/2022, 8:36 AM    Transition of Care Department [facility]) has reviewed patient and no TOC needs have been identified at this time. We will continue to monitor patient advancement through interdisciplinary progression rounds. If new patient transition needs arise, please place a TOC consult.    Expected Discharge Plan: Home/Self Care Barriers to Discharge: No Barriers Identified   Patient Goals and CMS Choice   CMS Medicare.gov Compare Post Acute Care list provided to:: Patient    Expected Discharge Plan and Services Expected Discharge Plan: Home/Self Care   Discharge Planning Services: CM Consult   Living arrangements for the past 2 months: Apartment                                      Prior Living Arrangements/Services Living arrangements for the past 2 months: Apartment Lives with:: Self Patient language and need for interpreter reviewed:: Yes Do you feel safe going back to the place where you live?: Yes            Criminal Activity/Legal Involvement Pertinent to Current Situation/Hospitalization: No - Comment as needed  Activities of Daily Living Home Assistive Devices/Equipment: None ADL Screening (condition at time of admission) Patient's cognitive ability adequate to safely complete daily activities?: Yes Is the patient deaf or have difficulty hearing?: No Does the patient have difficulty seeing, even when wearing glasses/contacts?: No Does the patient have difficulty  concentrating, remembering, or making decisions?: No Patient able to express need for assistance with ADLs?: Yes Does the patient have difficulty dressing or bathing?: No Independently performs ADLs?: Yes (appropriate for developmental age) Does the patient have difficulty walking or climbing stairs?: Yes Weakness of Legs: None Weakness of Arms/Hands: None  Permission Sought/Granted                  Emotional Assessment Appearance:: Appears stated age     Orientation: : Oriented to Self, Oriented to Place, Oriented to  Time, Oriented to Situation Alcohol / Substance Use: Not Applicable Psych Involvement: No (comment)  Admission diagnosis:  Hypoxia [R09.02] COPD exacerbation ([place]) [J44.1] COPD with acute exacerbation ([place]) [J44.1] Patient Active Problem List   Diagnosis Date Noted   Hyperlipidemia 05/18/2022   Thrombocytosis 05/18/2022   Leukocytosis 05/18/2022   Acute respiratory failure with hypoxia ([place]) 10/21/2018   Microcytic anemia 10/21/2018   COPD with acute exacerbation ([place]) 10/21/2018   CAP (community acquired pneumonia) 06/18/2017   Respiratory failure with hypoxia ([place]) 06/18/2017   Essential hypertension, benign 09/13/2014   CAFL (chronic airflow limitation) ([place]) 04/26/2012   SINUSITIS, CHRONIC 12/14/2007   BRONCHITIS 12/14/2007   PULMONARY FIBROSIS, POSTINFLAMMATORY 12/14/2007   G E R D 12/14/2007   PCP:  Patient, No Pcp Per (Inactive) Pharmacy:   [facility] [address] [place] [address] [place] [address] Phone: [phone]  Fax: [phone]     Social Determinants of Health (SDOH) Interventions    Readmission Risk Interventions     View : No data to display.

## 2022-05-19 NOTE — Progress Notes (Signed)
SATURATION QUALIFICATIONS: (This note is used to comply with regulatory documentation for home oxygen)  Patient Saturations on Room Air at Rest = 90%  Patient Saturations on Room Air while Ambulating = 83%  Patient Saturations on 2 Liters of oxygen while Ambulating = 90-92%  Please briefly explain why patient needs home oxygen: Oxygen saturations decreased too low while ambulating on RA, sats improved when 2L O2 [place] applied

## 2022-05-19 NOTE — Progress Notes (Signed)
PROGRESS NOTE    Breanna Ramirez  [id number] DOB: 04/19/1956 DOA: 05/18/2022 PCP: Patient, No Pcp Per (Inactive)   Brief Narrative: 66 year old with past medical history significant for COPD, pulmonary fibrosis, chronic airflow limitation, history of pneumonia, history of chronic sinusitis who presents to the ED complaining of progressive worsening shortness of breath, wheezing, fatigue, nonproductive cough.  He saw pulmonologist on Monday and was prescribed prednisone and azithromycin without significant relief.  She was noted to be hypoxic oxygen saturation 87% and she was placed on 2 L of oxygen.  She was admitted for further evaluation.  She received IVF steroids, IV ceftriaxone.  Chest x-ray showed chronic changes without any acute abnormality.    Assessment & Plan:   Principal Problem:   COPD with acute exacerbation (HCC) Active Problems:   G E R D   Essential hypertension, benign   Microcytic anemia   Hyperlipidemia   Thrombocytosis   Leukocytosis   1-COPD acute exacerbation: History of pulmonary fibrosis Presented with hypoxemia, worsening shortness of breath, failed oral prednisone.  BNP normal at 80, troponins negative, D-dimer less than 0.2 , COVID PCR and influenza negative, respiratory panel negative. Plan to continue with IV steroids, she still have bilateral wheezing. Continue with IV ceftriaxone.  She completed azithromycin. Start guaifenesin. Continue with Breo- incruse  Ellipta Continue with DuoNeb Continue with Singulair  GERD: Change Protonix to twice daily  3-Hyperlipidemia: Continue with simvastatin.  Hypertension: Continue with hydrochlorothiazide.  Microcytic anemia: Continue with iron supplement.  Thrombocytosis: reactive.   Leukocytosis:  likely related to a steroid     Estimated body mass index is 26.24 kg/m as calculated from the following:   Height as of this encounter: 5\' 5"  (1.651 m).   Weight as of this encounter: 71.5  kg.   DVT prophylaxis: Lovenox Code Status: Full code Family Communication: care discussed with patient.  Disposition Plan:  Status is: Observation The patient remains OBS appropriate and will d/c before 2 midnights.    Consultants:  none  Procedures:  none  Antimicrobials:  Ceftriaxone  Subjective: She reports feeling better, breathing a little bit better, she still have bilateral wheezing.  Cough is dry  Objective: Vitals:   05/18/22 1734 05/18/22 2100 05/19/22 0349 05/19/22 0400  BP:  129/65 123/61   Pulse:  100 70   Resp:  18 18   Temp:  98.3 F (36.8 C) 98.4 F (36.9 C)   TempSrc:  Oral Oral   SpO2: 93% 94% 99% 94%  Weight:      Height:        Intake/Output Summary (Last 24 hours) at 05/19/2022 0749 Last data filed at 05/19/2022 0400 Gross per 24 hour  Intake 606.01 ml  Output --  Net 606.01 ml   Filed Weights   05/18/22 1340  Weight: 71.5 kg    Examination:  General exam: Appears calm and comfortable  Respiratory system: Bilateral Wheezing.  Cardiovascular system: S1 & S2 heard, RRR.  Gastrointestinal system: Abdomen is nondistended, soft and nontender. No organomegaly or masses felt. Normal bowel sounds heard. Central nervous system: Alert and oriented. No focal neurological deficits. Extremities: Symmetric 5 x 5 power.     Data Reviewed: I have personally reviewed following labs and imaging studies  CBC: Recent Labs  Lab 05/18/22 1010 05/19/22 0345  WBC 13.5* 9.9  HGB 9.6* 8.7*  HCT 33.1* 29.5*  MCV 77.0* 76.4*  PLT 473* 393   Basic Metabolic Panel: Recent Labs  Lab 05/18/22 1010 05/18/22 1230  05/19/22 0345  NA 138  --  141  K 4.2  --  4.4  CL 104  --  105  CO2 24  --  29  GLUCOSE 119*  --  115*  BUN 18  --  16  CREATININE 0.86  --  0.74  CALCIUM 9.9  --  9.3  PHOS  --  3.1  --    GFR: Estimated Creatinine Clearance: 68.6 mL/min (by C-G formula based on SCr of 0.74 mg/dL). Liver Function Tests: Recent Labs  Lab  05/19/22 0345  AST 12*  ALT 12  ALKPHOS 49  BILITOT 0.4  PROT 6.2*  ALBUMIN 3.4*   No results for input(s): LIPASE, AMYLASE in the last 168 hours. No results for input(s): AMMONIA in the last 168 hours. Coagulation Profile: No results for input(s): INR, PROTIME in the last 168 hours. Cardiac Enzymes: No results for input(s): CKTOTAL, CKMB, CKMBINDEX, TROPONINI in the last 168 hours. BNP (last 3 results) No results for input(s): PROBNP in the last 8760 hours. HbA1C: No results for input(s): HGBA1C in the last 72 hours. CBG: No results for input(s): GLUCAP in the last 168 hours. Lipid Profile: No results for input(s): CHOL, HDL, LDLCALC, TRIG, CHOLHDL, LDLDIRECT in the last 72 hours. Thyroid Function Tests: No results for input(s): TSH, T4TOTAL, FREET4, T3FREE, THYROIDAB in the last 72 hours. Anemia Panel: No results for input(s): VITAMINB12, FOLATE, FERRITIN, TIBC, IRON, RETICCTPCT in the last 72 hours. Sepsis Labs: No results for input(s): PROCALCITON, LATICACIDVEN in the last 168 hours.  Recent Results (from the past 240 hour(s))  Resp Panel by RT-PCR (Flu A&B, Covid) Nasopharyngeal Swab     Status: None   Collection Time: 05/18/22 11:56 AM   Specimen: Nasopharyngeal Swab; Nasopharyngeal(NP) swabs in vial transport medium  Result Value Ref Range Status   SARS Coronavirus 2 by RT PCR NEGATIVE NEGATIVE Final    Comment: (NOTE) SARS-CoV-2 target nucleic acids are NOT DETECTED.  The SARS-CoV-2 RNA is generally detectable in upper respiratory specimens during the acute phase of infection. The lowest concentration of SARS-CoV-2 viral copies this assay can detect is 138 copies/mL. A negative result does not preclude SARS-Cov-2 infection and should not be used as the sole basis for treatment or other patient management decisions. A negative result may occur with  improper specimen collection/handling, submission of specimen other than nasopharyngeal swab, presence of viral  mutation(s) within the areas targeted by this assay, and inadequate number of viral copies(<138 copies/mL). A negative result must be combined with clinical observations, patient history, and epidemiological information. The expected result is Negative.  Fact Sheet for Patients:  BloggerCourse.com  Fact Sheet for Healthcare Providers:  SeriousBroker.it  This test is no t yet approved or cleared by the [place] FDA and  has been authorized for detection and/or diagnosis of SARS-CoV-2 by FDA under an Emergency Use Authorization (EUA). This EUA will remain  in effect (meaning this test can be used) for the duration of the COVID-19 declaration under Section 564(b)(1) of the Act, 21 U.S.C.section 360bbb-3(b)(1), unless the authorization is terminated  or revoked sooner.       Influenza A by PCR NEGATIVE NEGATIVE Final   Influenza B by PCR NEGATIVE NEGATIVE Final    Comment: (NOTE) The Xpert Xpress SARS-CoV-2/FLU/RSV plus assay is intended as an aid in the diagnosis of influenza from Nasopharyngeal swab specimens and should not be used as a sole basis for treatment. Nasal washings and aspirates are unacceptable for Xpert Xpress SARS-CoV-2/FLU/RSV testing.  Fact Sheet for Patients: BloggerCourse.com  Fact Sheet for Healthcare Providers: SeriousBroker.it  This test is not yet approved or cleared by the [place] FDA and has been authorized for detection and/or diagnosis of SARS-CoV-2 by FDA under an Emergency Use Authorization (EUA). This EUA will remain in effect (meaning this test can be used) for the duration of the COVID-19 declaration under Section 564(b)(1) of the Act, 21 U.S.C. section 360bbb-3(b)(1), unless the authorization is terminated or revoked.  Performed at [facility], [address] [place], [place] [address]   Respiratory (~20  pathogens) panel by PCR     Status: None   Collection Time: 05/18/22 11:56 AM   Specimen: Nasopharyngeal Swab; Respiratory  Result Value Ref Range Status   Adenovirus NOT DETECTED NOT DETECTED Final   Coronavirus 229E NOT DETECTED NOT DETECTED Final    Comment: (NOTE) The Coronavirus on the Respiratory Panel, DOES NOT test for the novel  Coronavirus (2019 nCoV)    Coronavirus HKU1 NOT DETECTED NOT DETECTED Final   Coronavirus NL63 NOT DETECTED NOT DETECTED Final   Coronavirus OC43 NOT DETECTED NOT DETECTED Final   Metapneumovirus NOT DETECTED NOT DETECTED Final   Rhinovirus / Enterovirus NOT DETECTED NOT DETECTED Final   Influenza A NOT DETECTED NOT DETECTED Final   Influenza B NOT DETECTED NOT DETECTED Final   Parainfluenza Virus 1 NOT DETECTED NOT DETECTED Final   Parainfluenza Virus 2 NOT DETECTED NOT DETECTED Final   Parainfluenza Virus 3 NOT DETECTED NOT DETECTED Final   Parainfluenza Virus 4 NOT DETECTED NOT DETECTED Final   Respiratory Syncytial Virus NOT DETECTED NOT DETECTED Final   Bordetella pertussis NOT DETECTED NOT DETECTED Final   Bordetella Parapertussis NOT DETECTED NOT DETECTED Final   Chlamydophila pneumoniae NOT DETECTED NOT DETECTED Final   Mycoplasma pneumoniae NOT DETECTED NOT DETECTED Final    Comment: Performed at [facility], [address] [place], [place] [address]         Radiology Studies: DG Chest 2 View  Result Date: 05/18/2022 CLINICAL DATA:  Shortness of breath.  Wheezing. EXAM: CHEST - 2 VIEW COMPARISON:  October 31, 2021 FINDINGS: Cardiomediastinal silhouette is stable. No pneumothorax. Blunting of the costophrenic angles unchanged since September 25, 2020. Emphysematous changes identified in the lungs. Interstitial prominence is similar in the interval. Chronic opacity in the lingula and right middle lobe was noted to represent atelectasis on the October 31, 2021 CT scan. The opacity in the right base is similar in appearance compared  to September 25, 2020. IMPRESSION: Chronic changes in the lungs as above. No definite acute abnormality. It would be difficult to assess for a subtle acute on chronic opacity. Electronically Signed   By: Gerome Sam III M.D.   On: 05/18/2022 10:41        Scheduled Meds:  ferrous sulfate  325 mg Oral Q breakfast   hydrochlorothiazide  25 mg Oral Daily   ipratropium-albuterol  3 mL Nebulization TID   mouth rinse  15 mL Mouth Rinse BID   montelukast  10 mg Oral QHS   pantoprazole  40 mg Oral Daily   predniSONE  40 mg Oral Q breakfast   simvastatin  40 mg Oral q1800   Continuous Infusions:   LOS: 0 days    Time spent: 35 minutes    Lamoyne Hessel A Dierdra Salameh, MD [facility]   If 7PM-7AM, please contact night-coverage [email or web address]  05/19/2022, 7:49 AM

## 2022-05-20 ENCOUNTER — Encounter: Payer: Self-pay | Admitting: Pulmonary Disease

## 2022-05-20 DIAGNOSIS — J441 Chronic obstructive pulmonary disease with (acute) exacerbation: Secondary | ICD-10-CM | POA: Diagnosis not present

## 2022-05-20 MED ORDER — MELATONIN 3 MG PO TABS
3.0000 mg | ORAL_TABLET | Freq: Once | ORAL | Status: AC
  Administered 2022-05-20: 3 mg via ORAL
  Filled 2022-05-20: qty 1

## 2022-05-20 NOTE — Progress Notes (Signed)
PROGRESS NOTE    Breanna Ramirez  YQM:[id number] DOB: 1956/01/04 DOA: 05/18/2022 PCP: Patient, No Pcp Per (Inactive)   Brief Narrative: 66 year old with past medical history significant for COPD, pulmonary fibrosis, chronic airflow limitation, history of pneumonia, history of chronic sinusitis who presents to the ED complaining of progressive worsening shortness of breath, wheezing, fatigue, nonproductive cough.  He saw pulmonologist on Monday and was prescribed prednisone and azithromycin without significant relief.  She was noted to be hypoxic oxygen saturation 87% and she was placed on 2 L of oxygen.  She was admitted for further evaluation.  She received IVF steroids, IV ceftriaxone.  Chest x-ray showed chronic changes without any acute abnormality.    Assessment & Plan:   Principal Problem:   COPD with acute exacerbation (HCC) Active Problems:   G E R D   Essential hypertension, benign   Microcytic anemia   Hyperlipidemia   Thrombocytosis   Leukocytosis   1-COPD acute exacerbation: History of pulmonary fibrosis Presented with hypoxemia, worsening shortness of breath, failed oral prednisone.  BNP normal at 80, troponins negative, D-dimer less than 0.2 , COVID PCR and influenza negative, respiratory panel negative. Plan to continue with IV steroids, she still have bilateral wheezing. Continue with IV ceftriaxone.  She completed azithromycin. Continue with  guaifenesin. Continue with Breo- incruse  Ellipta Continue with DuoNeb Continue with Singulair She will required Home Oxygen at discharge.  Plan to continue with IV steroids, still with BL wheezing.   GERD: Change Protonix to twice daily  Hyperlipidemia: Continue with simvastatin.  Hypertension: Continue with hydrochlorothiazide.  Microcytic anemia: Continue with iron supplement.  Thrombocytosis: reactive.   Leukocytosis:  likely related to a steroid     Estimated body mass index is 26.24 kg/m as  calculated from the following:   Height as of this encounter: 5\' 5"  (1.651 m).   Weight as of this encounter: 71.5 kg.   DVT prophylaxis: Lovenox Code Status: Full code Family Communication: care discussed with patient.  Disposition Plan:  Status is: Observation The patient remains OBS appropriate and will d/c before 2 midnights.    Consultants:  none  Procedures:  none  Antimicrobials:  Ceftriaxone  Subjective: She desat on ambulation and room air. She feels the same, report SOB>   Objective: Vitals:   05/19/22 1955 05/19/22 2049 05/20/22 0454 05/20/22 0803  BP:  (!) 114/59 126/67   Pulse:  81 92   Resp:  15 20   Temp:  97.7 F (36.5 C) 98 F (36.7 C)   TempSrc:  Oral    SpO2: 99% 94% 92% 97%  Weight:      Height:        Intake/Output Summary (Last 24 hours) at 05/20/2022 1230 Last data filed at 05/20/2022 0800 Gross per 24 hour  Intake 700 ml  Output --  Net 700 ml    Filed Weights   05/18/22 1340  Weight: 71.5 kg    Examination:  General exam: NAD Respiratory system: BBL wheezing  Cardiovascular system: S 1, S 2 RRR Gastrointestinal system: BS present, soft, nt Central nervous system:  Alert, Non focal.  Extremities: no edema     Data Reviewed: I have personally reviewed following labs and imaging studies  CBC: Recent Labs  Lab 05/18/22 1010 05/19/22 0345  WBC 13.5* 9.9  HGB 9.6* 8.7*  HCT 33.1* 29.5*  MCV 77.0* 76.4*  PLT 473* 393    Basic Metabolic Panel: Recent Labs  Lab 05/18/22 1010 05/18/22 1230 05/19/22  0345  NA 138  --  141  K 4.2  --  4.4  CL 104  --  105  CO2 24  --  29  GLUCOSE 119*  --  115*  BUN 18  --  16  CREATININE 0.86  --  0.74  CALCIUM 9.9  --  9.3  PHOS  --  3.1  --     GFR: Estimated Creatinine Clearance: 68.6 mL/min (by C-G formula based on SCr of 0.74 mg/dL). Liver Function Tests: Recent Labs  Lab 05/19/22 0345  AST 12*  ALT 12  ALKPHOS 49  BILITOT 0.4  PROT 6.2*  ALBUMIN 3.4*    No  results for input(s): LIPASE, AMYLASE in the last 168 hours. No results for input(s): AMMONIA in the last 168 hours. Coagulation Profile: No results for input(s): INR, PROTIME in the last 168 hours. Cardiac Enzymes: No results for input(s): CKTOTAL, CKMB, CKMBINDEX, TROPONINI in the last 168 hours. BNP (last 3 results) No results for input(s): PROBNP in the last 8760 hours. HbA1C: No results for input(s): HGBA1C in the last 72 hours. CBG: No results for input(s): GLUCAP in the last 168 hours. Lipid Profile: No results for input(s): CHOL, HDL, LDLCALC, TRIG, CHOLHDL, LDLDIRECT in the last 72 hours. Thyroid Function Tests: No results for input(s): TSH, T4TOTAL, FREET4, T3FREE, THYROIDAB in the last 72 hours. Anemia Panel: No results for input(s): VITAMINB12, FOLATE, FERRITIN, TIBC, IRON, RETICCTPCT in the last 72 hours. Sepsis Labs: No results for input(s): PROCALCITON, LATICACIDVEN in the last 168 hours.  Recent Results (from the past 240 hour(s))  Resp Panel by RT-PCR (Flu A&B, Covid) Nasopharyngeal Swab     Status: None   Collection Time: 05/18/22 11:56 AM   Specimen: Nasopharyngeal Swab; Nasopharyngeal(NP) swabs in vial transport medium  Result Value Ref Range Status   SARS Coronavirus 2 by RT PCR NEGATIVE NEGATIVE Final    Comment: (NOTE) SARS-CoV-2 target nucleic acids are NOT DETECTED.  The SARS-CoV-2 RNA is generally detectable in upper respiratory specimens during the acute phase of infection. The lowest concentration of SARS-CoV-2 viral copies this assay can detect is 138 copies/mL. A negative result does not preclude SARS-Cov-2 infection and should not be used as the sole basis for treatment or other patient management decisions. A negative result may occur with  improper specimen collection/handling, submission of specimen other than nasopharyngeal swab, presence of viral mutation(s) within the areas targeted by this assay, and inadequate number of viral copies(<138  copies/mL). A negative result must be combined with clinical observations, patient history, and epidemiological information. The expected result is Negative.  Fact Sheet for Patients:  BloggerCourse.com  Fact Sheet for Healthcare Providers:  SeriousBroker.it  This test is no t yet approved or cleared by the [place] FDA and  has been authorized for detection and/or diagnosis of SARS-CoV-2 by FDA under an Emergency Use Authorization (EUA). This EUA will remain  in effect (meaning this test can be used) for the duration of the COVID-19 declaration under Section 564(b)(1) of the Act, 21 U.S.C.section 360bbb-3(b)(1), unless the authorization is terminated  or revoked sooner.       Influenza A by PCR NEGATIVE NEGATIVE Final   Influenza B by PCR NEGATIVE NEGATIVE Final    Comment: (NOTE) The Xpert Xpress SARS-CoV-2/FLU/RSV plus assay is intended as an aid in the diagnosis of influenza from Nasopharyngeal swab specimens and should not be used as a sole basis for treatment. Nasal washings and aspirates are unacceptable for Xpert Xpress SARS-CoV-2/FLU/RSV  testing.  Fact Sheet for Patients: BloggerCourse.comhttps://www.fda.gov/media/152166/download  Fact Sheet for Healthcare Providers: SeriousBroker.ithttps://www.fda.gov/media/152162/download  This test is not yet approved or cleared by the [place] FDA and has been authorized for detection and/or diagnosis of SARS-CoV-2 by FDA under an Emergency Use Authorization (EUA). This EUA will remain in effect (meaning this test can be used) for the duration of the COVID-19 declaration under Section 564(b)(1) of the Act, 21 U.S.C. section 360bbb-3(b)(1), unless the authorization is terminated or revoked.  Performed at [facility], [address] [place], [place] [address]   Respiratory (~20 pathogens) panel by PCR     Status: None   Collection Time: 05/18/22 11:56 AM   Specimen:  Nasopharyngeal Swab; Respiratory  Result Value Ref Range Status   Adenovirus NOT DETECTED NOT DETECTED Final   Coronavirus 229E NOT DETECTED NOT DETECTED Final    Comment: (NOTE) The Coronavirus on the Respiratory Panel, DOES NOT test for the novel  Coronavirus (2019 nCoV)    Coronavirus HKU1 NOT DETECTED NOT DETECTED Final   Coronavirus NL63 NOT DETECTED NOT DETECTED Final   Coronavirus OC43 NOT DETECTED NOT DETECTED Final   Metapneumovirus NOT DETECTED NOT DETECTED Final   Rhinovirus / Enterovirus NOT DETECTED NOT DETECTED Final   Influenza A NOT DETECTED NOT DETECTED Final   Influenza B NOT DETECTED NOT DETECTED Final   Parainfluenza Virus 1 NOT DETECTED NOT DETECTED Final   Parainfluenza Virus 2 NOT DETECTED NOT DETECTED Final   Parainfluenza Virus 3 NOT DETECTED NOT DETECTED Final   Parainfluenza Virus 4 NOT DETECTED NOT DETECTED Final   Respiratory Syncytial Virus NOT DETECTED NOT DETECTED Final   Bordetella pertussis NOT DETECTED NOT DETECTED Final   Bordetella Parapertussis NOT DETECTED NOT DETECTED Final   Chlamydophila pneumoniae NOT DETECTED NOT DETECTED Final   Mycoplasma pneumoniae NOT DETECTED NOT DETECTED Final    Comment: Performed at [facility], [address] [place], [place] [address]          Radiology Studies: No results found.      Scheduled Meds:  azithromycin  500 mg Oral Daily   enoxaparin (LOVENOX) injection  40 mg Subcutaneous Q24H   ferrous sulfate  325 mg Oral Q breakfast   fluticasone furoate-vilanterol  1 puff Inhalation Daily   And   umeclidinium bromide  1 puff Inhalation Daily   guaiFENesin  1,200 mg Oral BID   hydrochlorothiazide  25 mg Oral Daily   ipratropium-albuterol  3 mL Nebulization TID   mouth rinse  15 mL Mouth Rinse BID   methylPREDNISolone (SOLU-MEDROL) injection  40 mg Intravenous Q12H   montelukast  10 mg Oral QHS   pantoprazole  40 mg Oral BID   simvastatin  40 mg Oral q1800   Continuous Infusions:   cefTRIAXone (ROCEPHIN)  IV 1 g (05/20/22 1042)     LOS: 1 day    Time spent: 35 minutes    Danamarie Minami A Quiana Cobaugh, MD [facility]   If 7PM-7AM, please contact night-coverage [email or web address]  05/20/2022, 12:30 PM

## 2022-05-20 NOTE — TOC Progression Note (Signed)
Transition of Care [facility]) - Progression Note    Patient Details  Name: Breanna Ramirez MRN: [id number] Date of Birth: 1956-10-14  [facility]) CM/SW Contact  Golda Acre, RN Phone Number: 05/20/2022, 11:04 AM  Clinical Narrative:    [facility] made aware of need for home 02   Expected Discharge Plan: Home/Self Care Barriers to Discharge: No Barriers Identified  Expected Discharge Plan and Services Expected Discharge Plan: Home/Self Care   Discharge Planning Services: CM Consult   Living arrangements for the past 2 months: Apartment                                       Social Determinants of Health (SDOH) Interventions    Readmission Risk Interventions     View : No data to display.

## 2022-05-20 NOTE — Progress Notes (Signed)
SATURATION QUALIFICATIONS: (This note is used to comply with regulatory documentation for home oxygen)  Patient Saturations on Room Air at Rest = 88%  Patient Saturations on 2 Liters of oxygen at Rest = 98%  Patient Saturations on 2 Liters of oxygen while Ambulating = 84%  Patient Saturations on 3 Liters of oxygen while Ambulating = 93%   Please briefly explain why patient needs home oxygen: Patient unable to maintain sufficient O2 saturation levels on rest on RA and requires 2L/min, 2L not sufficient for gait and recommend 3L/min for ambulation.    Wynn Maudlin, DPT Acute Rehabilitation Services Office [phone] Pager [phone]  05/20/22 10:59 AM

## 2022-05-20 NOTE — Evaluation (Signed)
Physical Therapy Evaluation Patient Details Name: Breanna Ramirez MRN: [id number] DOB: Nov 18, 1956 Today's Date: 05/20/2022  History of Present Illness  Patient is 66 y.o. female with medical history significant of COPD, pulmonary fibrosis, chronic airflow limitation, history of pneumonia, history of chronic sinusitis who is coming to the emergency department due to progressively worse dyspnea associated with wheezing, fatigue and nonproductive cough.  No sick contacts or travel history.  She saw her pulmonologist on Monday, was prescribed prednisone and azithromycin without significant relief.    Clinical Impression  Breanna Ramirez is 66 y.o. female admitted with above HPI and diagnosis. Patient is currently limited by functional impairments below (see PT problem list). Patient lives alone and is independent at baseline. Patient evaluated by Physical Therapy with no further acute PT needs identified. All education has been completed and the patient has no further questions. Patient is mobilizing at supervision to Mod independent level and is safe to mobilize with NT/RN supervision. See below for any follow-up Physical Therapy or equipment needs. PT is signing off. Thank you for this referral.     Recommendations for follow up therapy are one component of a multi-disciplinary discharge planning process, led by the attending physician.  Recommendations may be updated based on patient status, additional functional criteria and insurance authorization.  Follow Up Recommendations No PT follow up    Assistance Recommended at Discharge PRN  Patient can return home with the following       Equipment Recommendations  (supplemental O2)  Recommendations for Other Services       Functional Status Assessment Patient has had a recent decline in their functional status and demonstrates the ability to make significant improvements in function in a reasonable and predictable amount of time.      Precautions / Restrictions Precautions Precautions: Fall Precaution Comments: watch O2 sats Restrictions Weight Bearing Restrictions: No      Mobility  Bed Mobility Overal bed mobility: Modified Independent             General bed mobility comments: HOB elevated, slightly extra time    Transfers Overall transfer level: Needs assistance Equipment used: None Transfers: Sit to/from Stand Sit to Stand: Supervision           General transfer comment: supervision for safety    Ambulation/Gait Ambulation/Gait assistance: Min guard, Supervision Gait Distance (Feet): 200 Feet Assistive device: None Gait Pattern/deviations: WFL(Within Functional Limits), Shuffle, Drifts right/left Gait velocity: fair     General Gait Details: overall steady with slight drifting Rt/LT, pt able to correct balance without assist, progressed from min guard to supervision. verbal cues at start for management of O2 tank; pt able to pull without assist.  Stairs            Wheelchair Mobility    Modified Rankin (Stroke Patients Only)       Balance Overall balance assessment: Mild deficits observed, not formally tested                                           Pertinent Vitals/Pain Pain Assessment Pain Assessment: No/denies pain    Home Living Family/patient expects to be discharged to:: Private residence Living Arrangements: Alone Available Help at Discharge: Family;Available PRN/intermittently (sister) Type of Home: Apartment Home Access: Stairs to enter Entrance Stairs-Rails: None Entrance Stairs-Number of Steps: 1   Home Layout: One level Home Equipment: None Additional  Comments: pt does not use supplemental O2 at baseline    Prior Function Prior Level of Function : Independent/Modified Independent;Working/employed;Driving                     Hand Dominance        Extremity/Trunk Assessment   Upper Extremity Assessment Upper  Extremity Assessment: Overall WFL for tasks assessed    Lower Extremity Assessment Lower Extremity Assessment: Overall WFL for tasks assessed    Cervical / Trunk Assessment Cervical / Trunk Assessment: Normal  Communication   Communication: No difficulties  Cognition Arousal/Alertness: Awake/alert Behavior During Therapy: WFL for tasks assessed/performed Overall Cognitive Status: Within Functional Limits for tasks assessed                                          General Comments      Exercises     Assessment/Plan    PT Assessment Patient does not need any further PT services  PT Problem List         PT Treatment Interventions      PT Goals (Current goals can be found in the Care Plan section)  Acute Rehab PT Goals Patient Stated Goal: breathe better and go home PT Goal Formulation: All assessment and education complete, DC therapy Time For Goal Achievement: 05/21/22 Potential to Achieve Goals: Good    Frequency       Co-evaluation               AM-PAC PT "6 Clicks" Mobility  Outcome Measure Help needed turning from your back to your side while in a flat bed without using bedrails?: None Help needed moving from lying on your back to sitting on the side of a flat bed without using bedrails?: None Help needed moving to and from a bed to a chair (including a wheelchair)?: None Help needed standing up from a chair using your arms (e.g., wheelchair or bedside chair)?: None Help needed to walk in hospital room?: A Little Help needed climbing 3-5 steps with a railing? : A Little 6 Click Score: 22    End of Session Equipment Utilized During Treatment: Gait belt;Oxygen Activity Tolerance: Patient tolerated treatment well Patient left: in bed;with call bell/phone within reach Nurse Communication: Mobility status PT Visit Diagnosis: Difficulty in walking, not elsewhere classified (R26.2);Other abnormalities of gait and mobility (R26.89)     Time: 1010-1026 PT Time Calculation (min) (ACUTE ONLY): 16 min   Charges:   PT Evaluation $PT Eval Low Complexity: 1 Low          Wynn Maudlin, DPT Acute Rehabilitation Services Office [phone] Pager [phone]  05/20/22 10:53 AM

## 2022-05-21 DIAGNOSIS — K219 Gastro-esophageal reflux disease without esophagitis: Secondary | ICD-10-CM | POA: Diagnosis not present

## 2022-05-21 DIAGNOSIS — D509 Iron deficiency anemia, unspecified: Secondary | ICD-10-CM

## 2022-05-21 DIAGNOSIS — J9601 Acute respiratory failure with hypoxia: Secondary | ICD-10-CM | POA: Diagnosis not present

## 2022-05-21 DIAGNOSIS — I10 Essential (primary) hypertension: Secondary | ICD-10-CM | POA: Diagnosis not present

## 2022-05-21 DIAGNOSIS — J441 Chronic obstructive pulmonary disease with (acute) exacerbation: Secondary | ICD-10-CM | POA: Diagnosis not present

## 2022-05-21 DIAGNOSIS — D72829 Elevated white blood cell count, unspecified: Secondary | ICD-10-CM

## 2022-05-21 DIAGNOSIS — D75839 Thrombocytosis, unspecified: Secondary | ICD-10-CM

## 2022-05-21 MED ORDER — AMOXICILLIN-POT CLAVULANATE 875-125 MG PO TABS: 1.0000 | ORAL_TABLET | Freq: Two times a day (BID) | ORAL | 0 refills | Status: AC

## 2022-05-21 NOTE — Discharge Summary (Signed)
Physician Discharge Summary  Breanna Ramirez ZOX:[id number] DOB: 07-03-1956 DOA: 05/18/2022  PCP: Patient, No Pcp Per (Inactive)  Admit date: 05/18/2022 Discharge date: 05/21/2022 Admitted From: Home Disposition: Home Recommendations for Outpatient Follow-up:  Follow ups as below. Please obtain CBC/BMP/Mag at follow up Please follow up on the following pending results: None  Home Health: Not required Equipment/Devices: Home oxygen: 3 L by [place]  Discharge Condition: Stable CODE STATUS: Full code   Hospital course 66 year old F with PMH of COPD, pulmonary fibrosis and HTN presenting to ED with progressive SOB, wheeze, dry cough and fatigue and admitted for acute respiratory failure with hypoxia due to COPD exacerbation.  She failed outpatient treatment with p.o. prednisone, triple therapy, Zithromax and DuoNeb by her pulmonologist about 6 days prior to presentation.  She desaturated to 87% on room air and was placed on 2 L by nasal cannula.  She was started on IV Solu-Medrol, antibiotics, scheduled and as needed nebulizers with improvement in his symptoms.  On the day of discharge, symptoms improved but she required 3 L to maintain saturation in the 90s with ambulation.  She is discharged on home prednisone taper, Trelegy Ellipta, DuoNeb and 3 L by nasal cannula.  Patient to follow-up with a pulmonologist in 1 to 2 weeks.  See individual problem list below for more on hospital course.  Problems addressed during this hospitalization Acute respiratory failure with hypoxia due to COPD exacerbation-D-dimer, BNP and serial troponin negative.  COVID-19, influenza and full RVP panel negative.  Chest x-ray without acute finding. -Treated with IV Solu-Medrol, IV CTX, scheduled and as needed nebulizers and antibiotics -Discharge planning home prednisone taper, triple inhaler, Singulair, DuoNeb, Augmentin and home oxygen. -PPI for GI prophylaxis -Outpatient follow-up with pulmonologist -Encouraged  to establish care with PCP  GERD-PPI  Hypertension-continue HCTZ  Hyperlipidemia: Continue simvastatin  Microcytic anemia/history of iron deficiency anemia: H&H relatively stable. -Recheck CBC at follow-up  Thrombocytosis: Resolved.  Leukocytosis: Resolved.   Vital signs Vitals:   05/20/22 2134 05/21/22 0544 05/21/22 0757 05/21/22 1146  BP: 118/64 110/65  136/68  Pulse: (!) 103 79  89  Temp: 97.7 F (36.5 C) 98.2 F (36.8 C)  97.9 F (36.6 C)  Resp: Height:      Weight:      SpO2: 94% 97% 94% 91%  TempSrc: Oral Oral  Oral  BMI (Calculated):         Discharge exam  GENERAL: No apparent distress.  Nontoxic. HEENT: MMM.  Vision and hearing grossly intact.  NECK: Supple.  No apparent JVD.  RESP:  No IWOB.  Fair aeration bilaterally. CVS:  RRR. Heart sounds normal.  ABD/GI/GU: BS+. Abd soft, NTND.  MSK/EXT:  Moves extremities. No apparent deformity. No edema.  SKIN: no apparent skin lesion or wound NEURO: Awake and alert. Oriented appropriately.  No apparent focal neuro deficit. PSYCH: Calm. Normal affect.   Discharge Instructions Discharge Instructions     Call MD for:  difficulty breathing, headache or visual disturbances   Complete by: As directed    Call MD for:  extreme fatigue   Complete by: As directed    Diet - low sodium heart healthy   Complete by: As directed    Discharge instructions   Complete by: As directed    It has been a pleasure taking care of you!  You were hospitalized due to COPD exacerbation for which you have been treated with sMerridy Pascoething treatments and antibiotics.  Your symptoms  improved to the point we think it is safe to let you go home and follow-up with your primary care doctor and pulmonologist.  Please review your new medication list and the directions on your medications before you take them.  Follow-up with your primary care doctor and pulmonologist in 1 to 2 weeks or sooner if needed.   Take care,    Increase activity slowly   Complete by: As directed       Allergies as of 05/21/2022       Reactions   Prednisone Palpitations   Tolerates DepoMedrol well in oral and injectable.    Codeine Nausea And Vomiting        Medication List     STOP taking these medications    aspirin 325 MG tablet   azithromycin 250 MG tablet Commonly known as: Zithromax       TAKE these medications    albuterol 108 (90 Base) MCG/ACT inhaler Commonly known as: VENTOLIN HFA Inhale 1-2 puffs into the lungs every 6 (six) hours as needed for wheezing or shortness of breath.   amoxicillin-clavulanate 875-125 MG tablet Commonly known as: AUGMENTIN Take 1 tablet by mouth 2 (two) times daily for 4 days.   esomeprazole 40 MG capsule Commonly known as: NEXIUM Take 1 capsule (40 mg total) by mouth daily.   ferrous sulfate 325 (65 FE) MG tablet Take 1 tablet (325 mg total) by mouth daily.   fluticasone 50 MCG/ACT nasal spray Commonly known as: FLONASE Place 1 spray into both nostrils 2 (two) times daily.   hydrochlorothiazide 25 MG tablet Commonly known as: HYDRODIURIL Take 1 tablet (25 mg total) by mouth daily.   ipratropium-albuterol 0.5-2.5 (3) MG/3ML Soln Commonly known as: DUONEB SMARTSIG:1 Ampule(s) Via Nebulizer 3 Times Daily PRN What changed:  how much to take how to take this when to take this   montelukast 10 MG tablet Commonly known as: SINGULAIR Take 1 tablet (10 mg total) by mouth at bedtime.   predniSONE 10 MG tablet Commonly known as: DELTASONE Take 4 tablets (40 mg total) by mouth daily with breakfast for 3 days, THEN 3 tablets (30 mg total) daily with breakfast for 3 days, THEN 2 tablets (20 mg total) daily with breakfast for 3 days, THEN 1 tablet (10 mg total) daily with breakfast for 3 days. Start taking on: May 12, 2022   simvastatin 40 MG tablet Commonly known as: ZOCOR Take 1 tablet (40 mg total) by mouth at bedtime. What changed: when to take this    Trelegy Ellipta 100-62.5-25 MCG/ACT Aepb Generic drug: Fluticasone-Umeclidin-Vilant Inhale 1 puff into the lungs daily.        Consultations: none  Procedures/Studies:   DG Chest 2 View  Result Date: 05/18/2022 CLINICAL DATA:  Shortness of breath.  Wheezing. EXAM: CHEST - 2 VIEW COMPARISON:  October 31, 2021 FINDINGS: Cardiomediastinal silhouette is stable. No pneumothorax. Blunting of the costophrenic angles unchanged since September 25, 2020. Emphysematous changes identified in the lungs. Interstitial prominence is similar in the interval. Chronic opacity in the lingula and right middle lobe was noted to represent atelectasis on the October 31, 2021 CT scan. The opacity in the right base is similar in appearance compared to September 25, 2020. IMPRESSION: Chronic changes in the lungs as above. No definite acute abnormality. It would be difficult to assess for a subtle acute on chronic opacity. Electronically Signed   By: Gerome Samavid  Williams III M.D.   On: 05/18/2022 10:41  The results of significant diagnostics from this hospitalization (including imaging, microbiology, ancillary and laboratory) are listed below for reference.     Microbiology: Recent Results (from the past 240 hour(s))  Resp Panel by RT-PCR (Flu A&B, Covid) Nasopharyngeal Swab     Status: None   Collection Time: 05/18/22 11:56 AM   Specimen: Nasopharyngeal Swab; Nasopharyngeal(NP) swabs in vial transport medium  Result Value Ref Range Status   SARS Coronavirus 2 by RT PCR NEGATIVE NEGATIVE Final    Comment: (NOTE) SARS-CoV-2 target nucleic acids are NOT DETECTED.  The SARS-CoV-2 RNA is generally detectable in upper respiratory specimens during the acute phase of infection. The lowest concentration of SARS-CoV-2 viral copies this assay can detect is 138 copies/mL. A negative result does not preclude SARS-Cov-2 infection and should not be used as the sole basis for treatment or other patient management  decisions. A negative result may occur with  improper specimen collection/handling, submission of specimen other than nasopharyngeal swab, presence of viral mutation(s) within the areas targeted by this assay, and inadequate number of viral copies(<138 copies/mL). A negative result must be combined with clinical observations, patient history, and epidemiological information. The expected result is Negative.  Fact Sheet for Patients:  BloggerCourse.com  Fact Sheet for Healthcare Providers:  SeriousBroker.it  This test is no t yet approved or cleared by the [place] FDA and  has been authorized for detection and/or diagnosis of SARS-CoV-2 by FDA under an Emergency Use Authorization (EUA). This EUA will remain  in effect (meaning this test can be used) for the duration of the COVID-19 declaration under Section 564(b)(1) of the Act, 21 U.S.C.section 360bbb-3(b)(1), unless the authorization is terminated  or revoked sooner.       Influenza A by PCR NEGATIVE NEGATIVE Final   Influenza B by PCR NEGATIVE NEGATIVE Final    Comment: (NOTE) The Xpert Xpress SARS-CoV-2/FLU/RSV plus assay is intended as an aid in the diagnosis of influenza from Nasopharyngeal swab specimens and should not be used as a sole basis for treatment. Nasal washings and aspirates are unacceptable for Xpert Xpress SARS-CoV-2/FLU/RSV testing.  Fact Sheet for Patients: BloggerCourse.com  Fact Sheet for Healthcare Providers: SeriousBroker.it  This test is not yet approved or cleared by the [place] FDA and has been authorized for detection and/or diagnosis of SARS-CoV-2 by FDA under an Emergency Use Authorization (EUA). This EUA will remain in effect (meaning this test can be used) for the duration of the COVID-19 declaration under Section 564(b)(1) of the Act, 21 U.S.C. section 360bbb-3(b)(1), unless the  authorization is terminated or revoked.  Performed at [facility], [address] [place], [place] [address]   Respiratory (~20 pathogens) panel by PCR     Status: None   Collection Time: 05/18/22 11:56 AM   Specimen: Nasopharyngeal Swab; Respiratory  Result Value Ref Range Status   Adenovirus NOT DETECTED NOT DETECTED Final   Coronavirus 229E NOT DETECTED NOT DETECTED Final    Comment: (NOTE) The Coronavirus on the Respiratory Panel, DOES NOT test for the novel  Coronavirus (2019 nCoV)    Coronavirus HKU1 NOT DETECTED NOT DETECTED Final   Coronavirus NL63 NOT DETECTED NOT DETECTED Final   Coronavirus OC43 NOT DETECTED NOT DETECTED Final   Metapneumovirus NOT DETECTED NOT DETECTED Final   Rhinovirus / Enterovirus NOT DETECTED NOT DETECTED Final   Influenza A NOT DETECTED NOT DETECTED Final   Influenza B NOT DETECTED NOT DETECTED Final   Parainfluenza Virus 1 NOT DETECTED NOT DETECTED  Final   Parainfluenza Virus 2 NOT DETECTED NOT DETECTED Final   Parainfluenza Virus 3 NOT DETECTED NOT DETECTED Final   Parainfluenza Virus 4 NOT DETECTED NOT DETECTED Final   Respiratory Syncytial Virus NOT DETECTED NOT DETECTED Final   Bordetella pertussis NOT DETECTED NOT DETECTED Final   Bordetella Parapertussis NOT DETECTED NOT DETECTED Final   Chlamydophila pneumoniae NOT DETECTED NOT DETECTED Final   Mycoplasma pneumoniae NOT DETECTED NOT DETECTED Final    Comment: Performed at [facility], [address], [place], [place] [address]     Labs:  CBC: Recent Labs  Lab 05/18/22 1010 05/19/22 0345  WBC 13.5* 9.9  HGB 9.6* 8.7*  HCT 33.1* 29.5*  MCV 77.0* 76.4*  PLT 473* 393   BMP &GFR Recent Labs  Lab 05/18/22 1010 05/18/22 1230 05/19/22 0345  NA 138  --  141  K 4.2  --  4.4  CL 104  --  105  CO2 24  --  29  GLUCOSE 119*  --  115*  BUN 18  --  16  CREATININE 0.86  --  0.74  CALCIUM 9.9  --  9.3  PHOS  --  3.1  --    Estimated Creatinine  Clearance: 68.6 mL/min (by C-G formula based on SCr of 0.74 mg/dL). Liver & Pancreas: Recent Labs  Lab 05/19/22 0345  AST 12*  ALT 12  ALKPHOS 49  BILITOT 0.4  PROT 6.2*  ALBUMIN 3.4*   No results for input(s): LIPASE, AMYLASE in the last 168 hours. No results for input(s): AMMONIA in the last 168 hours. Diabetic: No results for input(s): HGBA1C in the last 72 hours. No results for input(s): GLUCAP in the last 168 hours. Cardiac Enzymes: No results for input(s): CKTOTAL, CKMB, CKMBINDEX, TROPONINI in the last 168 hours. No results for input(s): PROBNP in the last 8760 hours. Coagulation Profile: No results for input(s): INR, PROTIME in the last 168 hours. Thyroid Function Tests: No results for input(s): TSH, T4TOTAL, FREET4, T3FREE, THYROIDAB in the last 72 hours. Lipid Profile: No results for input(s): CHOL, HDL, LDLCALC, TRIG, CHOLHDL, LDLDIRECT in the last 72 hours. Anemia Panel: No results for input(s): VITAMINB12, FOLATE, FERRITIN, TIBC, IRON, RETICCTPCT in the last 72 hours. Urine analysis: No results found for: COLORURINE, APPEARANCEUR, LABSPEC, PHURINE, GLUCOSEU, HGBUR, BILIRUBINUR, KETONESUR, PROTEINUR, UROBILINOGEN, NITRITE, LEUKOCYTESUR Sepsis Labs: Invalid input(s): PROCALCITONIN, LACTICIDVEN   Time coordinating discharge: 45 minutes  SIGNED:  Almon Hercules, MD  [facility] 05/21/2022, 10:18 PM

## 2022-05-21 NOTE — Progress Notes (Signed)
SATURATION QUALIFICATIONS: (This note is used to comply with regulatory documentation for home oxygen)  Patient Saturations on Room Air at Rest  91%  Patient Saturations on Room Air while Ambulating 85%  Patient Saturations on 2.5 Liters of oxygen while Ambulating 89%  Please briefly explain why patient needs home oxygen:  Pt become short of breath, decreased O2 sats to 85% while ambulating in hall on RA, requiring oxygen to recover.   SRP, RN

## 2022-05-21 NOTE — Plan of Care (Signed)
  Problem: Activity: Goal: Ability to tolerate increased activity will improve Outcome: Progressing Goal: Will verbalize the importance of balancing activity with adequate rest periods Outcome: Progressing   Problem: Respiratory: Goal: Ability to maintain a clear airway will improve Outcome: Progressing Goal: Levels of oxygenation will improve Outcome: Progressing Goal: Ability to maintain adequate ventilation will improve Outcome: Progressing   

## 2022-05-21 NOTE — TOC Transition Note (Signed)
Transition of Care [facility]) - CM/SW Discharge Note   Patient Details  Name: Breanna Ramirez MRN: [id number] Date of Birth: Oct 18, 1956  [facility] of [facility] CM/SW Contact:  Leeroy Cha, RN Phone Number: 05/21/2022, 11:00 AM   Clinical Narrative:    O2 for home use delivered to patient this am. Pt dcd to return home.   Final next level of care: Home/Self Care Barriers to Discharge: No Barriers Identified   Patient Goals and CMS Choice Patient states their goals for this hospitalization and ongoing recovery are:: to go home CMS Medicare.gov Compare Post Acute Care list provided to:: Patient    Discharge Placement                       Discharge Plan and Services   Discharge Planning Services: CM Consult            DME Arranged: Oxygen DME Agency: [employer] Date DME Agency Contacted: 05/20/22 Time DME Agency Contacted: 58 Representative spoke with at DME Agency: [email or web address]            Social Determinants of Health ([place]) Interventions     Readmission Risk Interventions     View : No data to display.

## 2022-05-21 NOTE — Progress Notes (Signed)
Pt discharged to with home O2, instructions reviewed with pt, pt acknowledged understanding of instructions and demonstrated use of O2 and will plan to arrange an appointment with a PCP. SRP, RN

## 2022-05-22 DIAGNOSIS — D75839 Thrombocytosis, unspecified: Secondary | ICD-10-CM | POA: Diagnosis not present

## 2022-05-22 DIAGNOSIS — J449 Chronic obstructive pulmonary disease, unspecified: Secondary | ICD-10-CM | POA: Diagnosis not present

## 2022-05-22 DIAGNOSIS — E785 Hyperlipidemia, unspecified: Secondary | ICD-10-CM | POA: Diagnosis not present

## 2022-06-02 ENCOUNTER — Ambulatory Visit: Payer: BC Managed Care – PPO | Admitting: Pulmonary Disease

## 2022-06-02 ENCOUNTER — Encounter: Payer: Self-pay | Admitting: Pulmonary Disease

## 2022-06-02 VITALS — BP 124/62 | HR 88 | Ht 65.0 in | Wt 156.0 lb

## 2022-06-02 DIAGNOSIS — J9601 Acute respiratory failure with hypoxia: Secondary | ICD-10-CM

## 2022-06-02 DIAGNOSIS — J441 Chronic obstructive pulmonary disease with (acute) exacerbation: Secondary | ICD-10-CM

## 2022-06-02 DIAGNOSIS — Z87891 Personal history of nicotine dependence: Secondary | ICD-10-CM

## 2022-06-02 NOTE — Patient Instructions (Addendum)
Continue trelegy ellipta 1 puff daily - rinse mouth out after each use  Use duoneb nebulizer treatments 2-3 times per day over the next week  Use albuterol inhaler as needed  Continue singulair 10mg  daily  We will write work note to return to work on 5/12  Please call 7/12 to schedule an appointment to review FMLA paperwork if needed  Follow up as scheduled in August

## 2022-06-02 NOTE — Progress Notes (Signed)
Synopsis: Referred in February 2023 for wheezing by Lilla Shook, NP  Subjective:   PATIENT ID: Breanna Ramirez GENDER: female DOB: 11/05/1956, MRN: [id number]  HPI  Chief Complaint  Patient presents with   Hospitalization Follow-up    HFU for COPD. States she is currently feeling better.    Breanna Ramirez is a 66 year old woman, former smoker with COPD and GERD who returns to pulmonary clinic for COPD.   She was admitted 5/21 to 5/24 for COPD exacerbation. She was discharged with supplemental oxygen 3L. Using at night mainly.   She continues to have cough with some sputum production. She has wheezing but it is much improved. She is not quite back to baseline yet. She is concerned about returning to work where it is very hot and humid in [employer]. She is concerned it will aggravate her breathing again.   OV 05/12/22 She was treated for COPD exacerbation at last visit with prednisone taper with Zpak. She was started on singulair. She continues on symbicort 2 puffs twice dialy and as needed duoneb treatments. She reports relief from the prednisone taper but of recent she has increased cough, sputum production and wheezing.   PFTs show moderate restriction and diffusion defect. Flow volume loop does appear obstructed.   OV 02/12/22 She reports being treated in September 2022 for pneumonia and later developed COVID-19 infection in late October 2022 in which she was treated with Paxlovid.  She again was sick with pneumonia in December 2022 and responded well to antibiotics, steroids and using Symbicort inhaler.  She tapered herself off the Symbicort inhaler in January after she was feeling better.  She started to have wheezing towards the end of January along with shortness of breath.  She denies any cough or sputum production.  She reports that she notices the wheezing mostly at nighttime when going to bed.  She quit smoking in 2019.  She has a 40+ pack year smoking history.  She has  significant secondhand smoke exposure in childhood.  She currently works as a Solicitor at a [employer]  She denies any significant dust or chemical exposures.  She does have significant reflux and seasonal allergies.  She reports springtime is the most difficult season for her due to the pollen.  She denies any increased cough or dysphagia when eating or drinking.  She denies the sensation of food getting stuck in her throat or chest.  Her father had asthma and her maternal grandfather had asthma.  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   Past Medical History:  Diagnosis Date   COPD (chronic obstructive pulmonary disease) (HCC)    Essential hypertension    GERD (gastroesophageal reflux disease)    Pneumonia 05/2017   Respiratory failure with hypoxia (HCC) 05/2017     Family History  Problem Relation Age of Onset   Cancer Father      Social History   Socioeconomic History   Marital status: Single    Spouse name: Not on file   Number of children: 1   Years of education: Not on file   Highest education level: Not on file  Occupational History   Occupation: Works in Designer, fashion/clothing.  Tobacco Use   Smoking status: Former    Packs/day: 0.50    Years: 40.00    Pack years: 20.00    Types: Cigarettes    Quit date: 12/29/2017    Years since  quitting: 4.4   Smokeless tobacco: Never  Vaping Use   Vaping Use: Never used  Substance and Sexual Activity   Alcohol use: No   Drug use: No   Sexual activity: Never    Birth control/protection: Post-menopausal  Other Topics Concern   Not on file  Social History Narrative   Not on file   Social Determinants of Health   Financial Resource Strain: Not on file  Food Insecurity: Not on file  Transportation Needs: Not on file  Physical Activity: Not on file  Stress: Not on file  Social Connections: Not on file  Intimate Partner Violence: Not on file     Allergies  Allergen  Reactions   Prednisone Palpitations    Tolerates DepoMedrol well in oral and injectable.    Codeine Nausea And Vomiting     Outpatient Medications Prior to Visit  Medication Sig Dispense Refill   albuterol (PROVENTIL HFA;VENTOLIN HFA) 108 (90 Base) MCG/ACT inhaler Inhale 1-2 puffs into the lungs every 6 (six) hours as needed for wheezing or shortness of breath.     esomeprazole (NEXIUM) 40 MG capsule Take 1 capsule (40 mg total) by mouth daily. 90 capsule 4   fluticasone (FLONASE) 50 MCG/ACT nasal spray Place 1 spray into both nostrils 2 (two) times daily.     Fluticasone-Umeclidin-Vilant (TRELEGY ELLIPTA) 100-62.5-25 MCG/ACT AEPB Inhale 1 puff into the lungs daily. 28 each 6   hydrochlorothiazide (HYDRODIURIL) 25 MG tablet Take 1 tablet (25 mg total) by mouth daily. 90 tablet 3   ipratropium-albuterol (DUONEB) 0.5-2.5 (3) MG/3ML SOLN SMARTSIG:1 Ampule(s) Via Nebulizer 3 Times Daily PRN (Patient taking differently: Take 3 mLs by nebulization See admin instructions. SMARTSIG:1 Ampule(s) Via Nebulizer 3 Times Daily PRN) 360 mL 6   montelukast (SINGULAIR) 10 MG tablet Take 1 tablet (10 mg total) by mouth at bedtime. 30 tablet 11   simvastatin (ZOCOR) 40 MG tablet Take 1 tablet (40 mg total) by mouth at bedtime. (Patient taking differently: Take 40 mg by mouth daily at 6 PM.) 90 tablet 3   ferrous sulfate 325 (65 FE) MG tablet Take 1 tablet (325 mg total) by mouth daily. 30 tablet 3   No facility-administered medications prior to visit.   Review of Systems  Constitutional:  Negative for chills, fever, malaise/fatigue and weight loss.  HENT:  Positive for congestion. Negative for sinus pain and sore throat.   Eyes: Negative.   Respiratory:  Positive for cough, sputum production, shortness of breath and wheezing. Negative for hemoptysis.   Cardiovascular:  Negative for chest pain, palpitations, orthopnea, claudication and leg swelling.  Gastrointestinal:  Negative for abdominal pain, heartburn,  nausea and vomiting.  Genitourinary: Negative.   Musculoskeletal:  Negative for joint pain and myalgias.  Skin:  Negative for rash.  Neurological:  Negative for weakness and headaches.  Endo/Heme/Allergies: Negative.   Psychiatric/Behavioral: Negative.     Objective:   Vitals:   06/02/22 1607  BP: 124/62  Pulse: 88  SpO2: 95%  Weight: 156 lb (70.8 kg)  Height: 5\' 5"  (1.651 m)   Physical Exam Constitutional:      General: She is not in acute distress.    Appearance: She is not ill-appearing.  HENT:     Head: Normocephalic and atraumatic.  Eyes:     General: No scleral icterus.    Conjunctiva/sclera: Conjunctivae normal.  Cardiovascular:     Rate and Rhythm: Normal rate and regular rhythm.     Pulses: Normal pulses.     Heart  sounds: Normal heart sounds. No murmur heard. Pulmonary:     Effort: Pulmonary effort is normal.     Breath sounds: Rhonchi (Left anterior) present. No wheezing or rales.  Musculoskeletal:     Right lower leg: No edema.     Left lower leg: No edema.  Skin:    General: Skin is warm and dry.  Neurological:     General: No focal deficit present.     Mental Status: She is alert.  Psychiatric:        Mood and Affect: Mood normal.        Behavior: Behavior normal.        Thought Content: Thought content normal.        Judgment: Judgment normal.   CBC    Component Value Date/Time   WBC 9.9 05/19/2022 0345   RBC 3.86 (L) 05/19/2022 0345   HGB 8.7 (L) 05/19/2022 0345   HCT 29.5 (L) 05/19/2022 0345   PLT 393 05/19/2022 0345   MCV 76.4 (L) 05/19/2022 0345   MCH 22.5 (L) 05/19/2022 0345   MCHC 29.5 (L) 05/19/2022 0345   RDW 17.2 (H) 05/19/2022 0345   LYMPHSABS 1.1 10/31/2021 0919   MONOABS 0.6 10/31/2021 0919   EOSABS 0.2 10/31/2021 0919   BASOSABS 0.0 10/31/2021 0919      Latest Ref Rng & Units 05/19/2022    3:45 AM 05/18/2022   10:10 AM 10/31/2021   11:31 AM  BMP  Glucose 70 - 99 mg/dL 409115   [id number]     BUN 8 - 23 mg/dL 16   18      Creatinine 0.44 - 1.00 mg/dL 9.140.74   7.820.86     Sodium 135 - 145 mmol/L 141   138   139    Potassium 3.5 - 5.1 mmol/L 4.4   4.2   3.7    Chloride 98 - 111 mmol/L 105   104     CO2 22 - 32 mmol/L 29   24     Calcium 8.9 - 10.3 mg/dL 9.3   9.9      Chest imaging: CXR 05/18/22 Cardiomediastinal silhouette is stable. No pneumothorax. Blunting of the costophrenic angles unchanged since September 25, 2020. Emphysematous changes identified in the lungs. Interstitial prominence is similar in the interval. Chronic opacity in the lingula and right middle lobe was noted to represent atelectasis on the October 31, 2021 CT scan. The opacity in the right base is similar in appearance compared to September 25, 2020.  CTA Chest PE 10/31/21 1.  No evidence of pulmonary embolism. 2. Low lung volumes with chronic bibasilar volume loss and areas of atelectasis/scarring. Mosaic attenuation is indicative of air trapping. 3. Debris in the esophagus, suggesting dysmotility or gastroesophageal reflux. 4. Age advanced coronary artery atherosclerosis. Recommend assessment of coronary risk factors and consideration of medical therapy. 5. Aortic atherosclerosis  PFT:    Latest Ref Rng & Units 05/12/2022   10:47 AM  PFT Results  FVC-Pre L 1.67    FVC-Predicted Pre % 51    FVC-Post L 1.72    FVC-Predicted Post % 52    Pre FEV1/FVC % % 77    Post FEV1/FCV % % 79    FEV1-Pre L 1.28    FEV1-Predicted Pre % 51    FEV1-Post L 1.36    DLCO uncorrected ml/min/mmHg 8.38    DLCO UNC% % 41    DLCO corrected ml/min/mmHg 8.38    DLCO COR %Predicted % 41  DLVA Predicted % 56    TLC L 4.06    TLC % Predicted % 78    RV % Predicted % 104      Labs:  Path:  Echo:  Heart Catheterization:  Assessment & Plan:   COPD with acute exacerbation (HCC)  Former smoker  Acute hypoxemic respiratory failure (HCC) - Plan: Pulse oximetry, overnight, CANCELED: Pulse oximetry, overnight  Discussion: Breanna Ramirez is  a 66 year old woman, former smoker with COPD and GERD who returns to pulmonary clinic for COPD.   She is nearing the tail end of her COPD exacerbation. She is to continue trelegy ellipta 1 puff daily and as needed duoneb nebulizer treatments. I have advised her to try benadryl at night for the sedative effect to help her sleep. She has codeine allergy so unable to prescribe codeine cough syrup.   She is to continue Singulair 10 mg daily for seasonal allergies.  Follow up in August as scheduled.  Work note provided today.  Melody Comas, MD [facility] Office: [phone]   Current Outpatient Medications:    albuterol (PROVENTIL HFA;VENTOLIN HFA) 108 (90 Base) MCG/ACT inhaler, Inhale 1-2 puffs into the lungs every 6 (six) hours as needed for wheezing or shortness of breath., Disp: , Rfl:    esomeprazole (NEXIUM) 40 MG capsule, Take 1 capsule (40 mg total) by mouth daily., Disp: 90 capsule, Rfl: 4   fluticasone (FLONASE) 50 MCG/ACT nasal spray, Place 1 spray into both nostrils 2 (two) times daily., Disp: , Rfl:    Fluticasone-Umeclidin-Vilant (TRELEGY ELLIPTA) 100-62.5-25 MCG/ACT AEPB, Inhale 1 puff into the lungs daily., Disp: 28 each, Rfl: 6   hydrochlorothiazide (HYDRODIURIL) 25 MG tablet, Take 1 tablet (25 mg total) by mouth daily., Disp: 90 tablet, Rfl: 3   ipratropium-albuterol (DUONEB) 0.5-2.5 (3) MG/3ML SOLN, SMARTSIG:1 Ampule(s) Via Nebulizer 3 Times Daily PRN (Patient taking differently: Take 3 mLs by nebulization See admin instructions. SMARTSIG:1 Ampule(s) Via Nebulizer 3 Times Daily PRN), Disp: 360 mL, Rfl: 6   montelukast (SINGULAIR) 10 MG tablet, Take 1 tablet (10 mg total) by mouth at bedtime., Disp: 30 tablet, Rfl: 11   simvastatin (ZOCOR) 40 MG tablet, Take 1 tablet (40 mg total) by mouth at bedtime. (Patient taking differently: Take 40 mg by mouth daily at 6 PM.), Disp: 90 tablet, Rfl: 3   ferrous sulfate 325 (65 FE) MG tablet, Take 1 tablet (325 mg  total) by mouth daily., Disp: 30 tablet, Rfl: 3

## 2022-06-18 DIAGNOSIS — G4730 Sleep apnea, unspecified: Secondary | ICD-10-CM | POA: Diagnosis not present

## 2022-06-18 DIAGNOSIS — R0683 Snoring: Secondary | ICD-10-CM | POA: Diagnosis not present

## 2022-06-22 DIAGNOSIS — J449 Chronic obstructive pulmonary disease, unspecified: Secondary | ICD-10-CM | POA: Diagnosis not present

## 2022-06-22 DIAGNOSIS — D75839 Thrombocytosis, unspecified: Secondary | ICD-10-CM | POA: Diagnosis not present

## 2022-06-22 DIAGNOSIS — E785 Hyperlipidemia, unspecified: Secondary | ICD-10-CM | POA: Diagnosis not present

## 2022-06-26 ENCOUNTER — Telehealth: Payer: Self-pay | Admitting: Pulmonary Disease

## 2022-06-26 NOTE — Telephone Encounter (Signed)
Received ONO results from [employer]. Will place results in Dr. Lanora Manis review folder.

## 2022-07-03 ENCOUNTER — Telehealth: Payer: Self-pay | Admitting: Pulmonary Disease

## 2022-07-03 NOTE — Telephone Encounter (Signed)
Patient is needing a letter from Dr. Francine Graven stating that she needs to be out of work for a few months due to having COPD and the heat causing her trouble breathing. Also her oxygen levels are at 91. Patient works at [employer]. Once she has this letter they will give her FMLA forms to fill out.   Please advise on letter- patient's call back number is [phone].

## 2022-07-04 NOTE — Telephone Encounter (Signed)
Attempted to call pt but unable to reach. Left message for her to return call. 

## 2022-07-04 NOTE — Telephone Encounter (Signed)
ATC patient, left message letting her know that I was going to forward message to Dr. Francine Graven and that he is out of the office till Monday and then will be working in the hospital. Also advised that once we heard back from him we would call her.     Patient is needing a letter from Dr. Francine Graven stating that she needs to be out of work for a few months due to having COPD and the heat causing her trouble breathing. Also her oxygen levels are at 91. Patient works at [employer]. Once she has this letter they will give her FMLA forms to fill out.   Please advise

## 2022-07-07 NOTE — Telephone Encounter (Signed)
Please have the patient bring in specific documents from her employer that she needs filled out or to bring in the [facility] paperwork. I am not able to write an open ended letter to keep her out of work.   JD

## 2022-07-07 NOTE — Telephone Encounter (Signed)
Called patient and left voicemail for her to call office back in regards to Dr Cindi Carbon message.

## 2022-07-10 NOTE — Telephone Encounter (Signed)
Attempted to call pt but unable to reach. Left message for her to return call. 

## 2022-07-10 NOTE — Telephone Encounter (Signed)
Patient spent 6hr 52 mins with SpO2 less than 88%. She does qualify for supplemental oxygen. I would recommend she have formal sleep study in the lab for further evaluation. If patient is ok with this, please place order.   We will place orders for supplemental oxygen if needed after her formal sleep study.  Thanks, JD

## 2022-07-14 NOTE — Telephone Encounter (Signed)
Called and spoke with patient. She verbalized understanding of results. She wants to take some time to think about doing the sleep study. She will call us back once she has decided on whether or not she wants to proceed. Will keep this encounter open for follow up.

## 2022-07-22 DIAGNOSIS — J449 Chronic obstructive pulmonary disease, unspecified: Secondary | ICD-10-CM | POA: Diagnosis not present

## 2022-07-22 DIAGNOSIS — E785 Hyperlipidemia, unspecified: Secondary | ICD-10-CM | POA: Diagnosis not present

## 2022-07-22 DIAGNOSIS — D75839 Thrombocytosis, unspecified: Secondary | ICD-10-CM | POA: Diagnosis not present

## 2022-08-22 DIAGNOSIS — D75839 Thrombocytosis, unspecified: Secondary | ICD-10-CM | POA: Diagnosis not present

## 2022-08-22 DIAGNOSIS — E785 Hyperlipidemia, unspecified: Secondary | ICD-10-CM | POA: Diagnosis not present

## 2022-08-22 DIAGNOSIS — J449 Chronic obstructive pulmonary disease, unspecified: Secondary | ICD-10-CM | POA: Diagnosis not present

## 2022-08-25 ENCOUNTER — Encounter: Payer: Self-pay | Admitting: Pulmonary Disease

## 2022-08-25 ENCOUNTER — Ambulatory Visit: Payer: BC Managed Care – PPO | Admitting: Pulmonary Disease

## 2022-08-25 VITALS — BP 94/60 | HR 80 | Temp 98.4°F | Ht 65.0 in | Wt 145.0 lb

## 2022-08-25 DIAGNOSIS — J449 Chronic obstructive pulmonary disease, unspecified: Secondary | ICD-10-CM

## 2022-08-25 DIAGNOSIS — R0982 Postnasal drip: Secondary | ICD-10-CM | POA: Diagnosis not present

## 2022-08-25 DIAGNOSIS — Z87891 Personal history of nicotine dependence: Secondary | ICD-10-CM

## 2022-08-25 MED ORDER — TRELEGY ELLIPTA 200-62.5-25 MCG/ACT IN AEPB: 1.0000 | INHALATION_SPRAY | Freq: Every day | RESPIRATORY_TRACT | 11 refills | Status: DC

## 2022-08-25 MED ORDER — MONTELUKAST SODIUM 10 MG PO TABS: 10.0000 mg | ORAL_TABLET | Freq: Every day | ORAL | 11 refills | Status: AC

## 2022-08-25 MED ORDER — PREDNISONE 10 MG PO TABS: 40.0000 mg | ORAL_TABLET | Freq: Every day | ORAL | 0 refills | Status: DC

## 2022-08-25 MED ORDER — IPRATROPIUM BROMIDE 0.03 % NA SOLN: 2.0000 | Freq: Two times a day (BID) | NASAL | 12 refills | Status: DC

## 2022-08-25 NOTE — Patient Instructions (Addendum)
We will increase your trelegy ellipta dosage.  Use trelegy ellipta 200, 1 puff daily - rinse mouth out after each use  Recommend using duoneb nebulizer treatment followed by flutter valve first thing in the morning and before bedtime for air way clearance  Continue montelukast 10mg  each evening  Continue supplemental oxygen at night  Start ipratropium nasal spray, 2 sprays per nostril twice daily as needed.   We will refer you to our lung cancer screening program for annual CT scan screening.   Follow up in 6 months

## 2022-08-25 NOTE — Progress Notes (Signed)
Synopsis: Referred in February 2023 for wheezing by Lilla Shook, NP  Subjective:   PATIENT ID: Breanna Ramirez GENDER: female DOB: Oct 11, 1956, MRN: [id number]  HPI  Chief Complaint  Patient presents with   Follow-up    She states she had PNA again July 2023- tx with levaquin and pred and then another round of abx- amoxicillin. She still has some chest congestion and minimal cough with occ clear sputum. She rarely uses albuterol inhaler or neb.    Breanna Ramirez is a 66 year old woman, former smoker with COPD and GERD who returns to pulmonary clinic for COPD.   ONO since last visit showed she qualified for night time oxygen. She is considering formal sleep study when she has more time away from work.  She was treated with levaquin for 5 days and then augmentin for 10 days in July by her nurse practitioner at work for on going cough.   She reports her breathing is well controlled at this time but continues to have some wheezing.   She has run out of montelukast. She has significant post nasal drainage. GERD is well controlled at this time.   OV 06/02/22 She was admitted 5/21 to 5/24 for COPD exacerbation. She was discharged with supplemental oxygen 3L. Using at night mainly.   She continues to have cough with some sputum production. She has wheezing but it is much improved. She is not quite back to baseline yet. She is concerned about returning to work where it is very hot and humid in [employer]. She is concerned it will aggravate her breathing again.   OV 05/12/22 She was treated for COPD exacerbation at last visit with prednisone taper with Zpak. She was started on singulair. She continues on symbicort 2 puffs twice dialy and as needed duoneb treatments. She reports relief from the prednisone taper but of recent she has increased cough, sputum production and wheezing.   PFTs show moderate restriction and diffusion defect. Flow volume loop does appear obstructed.   OV 02/12/22 She  reports being treated in September 2022 for pneumonia and later developed COVID-19 infection in late October 2022 in which she was treated with Paxlovid.  She again was sick with pneumonia in December 2022 and responded well to antibiotics, steroids and using Symbicort inhaler.  She tapered herself off the Symbicort inhaler in January after she was feeling better.  She started to have wheezing towards the end of January along with shortness of breath.  She denies any cough or sputum production.  She reports that she notices the wheezing mostly at nighttime when going to bed.  She quit smoking in 2019.  She has a 40+ pack year smoking history.  She has significant secondhand smoke exposure in childhood.  She currently works as a Solicitor at a [employer]  She denies any significant dust or chemical exposures.  She does have significant reflux and seasonal allergies.  She reports springtime is the most difficult season for her due to the pollen.  She denies any increased cough or dysphagia when eating or drinking.  She denies the sensation of food getting stuck in her throat or chest.  Her father had asthma and her maternal grandfather had asthma.  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   Past Medical History:  Diagnosis Date   COPD (chronic obstructive pulmonary disease) (HCC)    Essential hypertension    GERD (gastroesophageal reflux  disease)    Pneumonia 05/2017   Respiratory failure with hypoxia (HCC) 05/2017     Family History  Problem Relation Age of Onset   Cancer Father      Social History   Socioeconomic History   Marital status: Single    Spouse name: Not on file   Number of children: 1   Years of education: Not on file   Highest education level: Not on file  Occupational History   Occupation: Works in Designer, fashion/clothingtextiles.  Tobacco Use   Smoking status: Former    Packs/day: 0.50    Years: 40.00    Total pack years: 20.00     Types: Cigarettes    Quit date: 12/29/2017    Years since quitting: 4.6   Smokeless tobacco: Never  Vaping Use   Vaping Use: Never used  Substance and Sexual Activity   Alcohol use: No   Drug use: No   Sexual activity: Never    Birth control/protection: Post-menopausal  Other Topics Concern   Not on file  Social History Narrative   Not on file   Social Determinants of Health   Financial Resource Strain: Not on file  Food Insecurity: Not on file  Transportation Needs: Not on file  Physical Activity: Not on file  Stress: Not on file  Social Connections: Not on file  Intimate Partner Violence: Not on file     Allergies  Allergen Reactions   Codeine Nausea And Vomiting     Outpatient Medications Prior to Visit  Medication Sig Dispense Refill   albuterol (PROVENTIL HFA;VENTOLIN HFA) 108 (90 Base) MCG/ACT inhaler Inhale 1-2 puffs into the lungs every 6 (six) hours as needed for wheezing or shortness of breath.     esomeprazole (NEXIUM) 40 MG capsule Take 1 capsule (40 mg total) by mouth daily. 90 capsule 4   ferrous sulfate 325 (65 FE) MG tablet Take 1 tablet (325 mg total) by mouth daily. 30 tablet 3   fluticasone (FLONASE) 50 MCG/ACT nasal spray Place 1 spray into both nostrils 2 (two) times daily.     hydrochlorothiazide (HYDRODIURIL) 25 MG tablet Take 1 tablet (25 mg total) by mouth daily. 90 tablet 3   ipratropium-albuterol (DUONEB) 0.5-2.5 (3) MG/3ML SOLN SMARTSIG:1 Ampule(s) Via Nebulizer 3 Times Daily PRN (Patient taking differently: Take 3 mLs by nebulization See admin instructions. SMARTSIG:1 Ampule(s) Via Nebulizer 3 Times Daily PRN) 360 mL 6   simvastatin (ZOCOR) 40 MG tablet Take 1 tablet (40 mg total) by mouth at bedtime. (Patient taking differently: Take 40 mg by mouth daily at 6 PM.) 90 tablet 3   Fluticasone-Umeclidin-Vilant (TRELEGY ELLIPTA) 100-62.5-25 MCG/ACT AEPB Inhale 1 puff into the lungs daily. 28 each 6   montelukast (SINGULAIR) 10 MG tablet Take 1 tablet  (10 mg total) by mouth at bedtime. 30 tablet 11   No facility-administered medications prior to visit.   Review of Systems  Constitutional:  Negative for chills, fever, malaise/fatigue and weight loss.  HENT:  Positive for congestion. Negative for sinus pain and sore throat.   Eyes: Negative.   Respiratory:  Positive for cough, sputum production, shortness of breath and wheezing. Negative for hemoptysis.   Cardiovascular:  Negative for chest pain, palpitations, orthopnea, claudication and leg swelling.  Gastrointestinal:  Negative for abdominal pain, heartburn, nausea and vomiting.  Genitourinary: Negative.   Musculoskeletal:  Negative for joint pain and myalgias.  Skin:  Negative for rash.  Neurological:  Negative for weakness and headaches.  Endo/Heme/Allergies: Negative.   Psychiatric/Behavioral:  Negative.      Objective:   Vitals:   08/25/22 0920  BP: 94/60  Pulse: 80  Temp: 98.4 F (36.9 C)  TempSrc: Oral  SpO2: 94%  Weight: 145 lb (65.8 kg)  Height: 5\' 5"  (1.651 m)   Physical Exam Constitutional:      General: She is not in acute distress.    Appearance: She is not ill-appearing.  HENT:     Head: Normocephalic and atraumatic.  Eyes:     General: No scleral icterus.    Conjunctiva/sclera: Conjunctivae normal.  Cardiovascular:     Rate and Rhythm: Normal rate and regular rhythm.     Pulses: Normal pulses.     Heart sounds: Normal heart sounds. No murmur heard. Pulmonary:     Effort: Pulmonary effort is normal.     Breath sounds: Wheezing and rhonchi (mild, scattered) present. No rales.  Musculoskeletal:     Right lower leg: No edema.     Left lower leg: No edema.  Skin:    General: Skin is warm and dry.  Neurological:     General: No focal deficit present.     Mental Status: She is alert.  Psychiatric:        Mood and Affect: Mood normal.        Behavior: Behavior normal.        Thought Content: Thought content normal.        Judgment: Judgment normal.     CBC    Component Value Date/Time   WBC 9.9 05/19/2022 0345   RBC 3.86 (L) 05/19/2022 0345   HGB 8.7 (L) 05/19/2022 0345   HCT 29.5 (L) 05/19/2022 0345   PLT 393 05/19/2022 0345   MCV 76.4 (L) 05/19/2022 0345   MCH 22.5 (L) 05/19/2022 0345   MCHC 29.5 (L) 05/19/2022 0345   RDW 17.2 (H) 05/19/2022 0345   LYMPHSABS 1.1 10/31/2021 0919   MONOABS 0.6 10/31/2021 0919   EOSABS 0.2 10/31/2021 0919   BASOSABS 0.0 10/31/2021 0919      Latest Ref Rng & Units 05/19/2022    3:45 AM 05/18/2022   10:10 AM 10/31/2021   11:31 AM  BMP  Glucose 70 - 99 mg/dL 13/02/2021  929    BUN 8 - 23 mg/dL 16  18    Creatinine 244 - 1.00 mg/dL 6.28  6.38    Sodium 1.77 - 145 mmol/L 141  138  139   Potassium 3.5 - 5.1 mmol/L 4.4  4.2  3.7   Chloride 98 - 111 mmol/L 105  104    CO2 22 - 32 mmol/L 29  24    Calcium 8.9 - 10.3 mg/dL 9.3  9.9     Chest imaging: CXR 05/18/22 Cardiomediastinal silhouette is stable. No pneumothorax. Blunting of the costophrenic angles unchanged since September 25, 2020. Emphysematous changes identified in the lungs. Interstitial prominence is similar in the interval. Chronic opacity in the lingula and right middle lobe was noted to represent atelectasis on the October 31, 2021 CT scan. The opacity in the right base is similar in appearance compared to September 25, 2020.  CTA Chest PE 10/31/21 1.  No evidence of pulmonary embolism. 2. Low lung volumes with chronic bibasilar volume loss and areas of atelectasis/scarring. Mosaic attenuation is indicative of air trapping. 3. Debris in the esophagus, suggesting dysmotility or gastroesophageal reflux. 4. Age advanced coronary artery atherosclerosis. Recommend assessment of coronary risk factors and consideration of medical therapy. 5. Aortic atherosclerosis  PFT:  Latest Ref Rng & Units 05/12/2022   10:47 AM  PFT Results  FVC-Pre L 1.67   FVC-Predicted Pre % 51   FVC-Post L 1.72   FVC-Predicted Post % 52   Pre FEV1/FVC  % % 77   Post FEV1/FCV % % 79   FEV1-Pre L 1.28   FEV1-Predicted Pre % 51   FEV1-Post L 1.36   DLCO uncorrected ml/min/mmHg 8.38   DLCO UNC% % 41   DLCO corrected ml/min/mmHg 8.38   DLCO COR %Predicted % 41   DLVA Predicted % 56   TLC L 4.06   TLC % Predicted % 78   RV % Predicted % 104     Labs:  Path:  Echo:  Heart Catheterization:  Assessment & Plan:   Chronic obstructive pulmonary disease, unspecified COPD type (HCC) - Plan: montelukast (SINGULAIR) 10 MG tablet, Fluticasone-Umeclidin-Vilant (TRELEGY ELLIPTA) 200-62.5-25 MCG/ACT AEPB, ipratropium (ATROVENT) 0.03 % nasal spray, predniSONE (DELTASONE) 10 MG tablet  Post-nasal drainage  Former smoker - Plan: Ambulatory Referral for Lung Cancer Scre  Discussion: Breanna Ramirez is a 66 year old woman, former smoker with COPD and GERD who returns to pulmonary clinic for COPD.   Her breathing is better at this time but continues to have daily wheezing. She has significant post-nasal drainage which could be on going irritant to her COPD.   We will increase her trelegy to 200, 1 puff daily. She is to use duoneb treatments twice daily followed by flutter valve for air way clearance. She is to continue using fluticasone nasal spray, 2 sprays per nostril daily and she is to start ipratropium nasal spray 2 sprays per nostril twice daily as needed. She is to continue Singulair 10 mg daily for seasonal allergies.  Follow up in 6 months.  Melody Comas, MD [facility] Office: [phone]   Current Outpatient Medications:    albuterol (PROVENTIL HFA;VENTOLIN HFA) 108 (90 Base) MCG/ACT inhaler, Inhale 1-2 puffs into the lungs every 6 (six) hours as needed for wheezing or shortness of breath., Disp: , Rfl:    esomeprazole (NEXIUM) 40 MG capsule, Take 1 capsule (40 mg total) by mouth daily., Disp: 90 capsule, Rfl: 4   ferrous sulfate 325 (65 FE) MG tablet, Take 1 tablet (325 mg total) by mouth daily., Disp: 30  tablet, Rfl: 3   fluticasone (FLONASE) 50 MCG/ACT nasal spray, Place 1 spray into both nostrils 2 (two) times daily., Disp: , Rfl:    Fluticasone-Umeclidin-Vilant (TRELEGY ELLIPTA) 200-62.5-25 MCG/ACT AEPB, Inhale 1 puff into the lungs daily., Disp: 28 each, Rfl: 11   hydrochlorothiazide (HYDRODIURIL) 25 MG tablet, Take 1 tablet (25 mg total) by mouth daily., Disp: 90 tablet, Rfl: 3   ipratropium (ATROVENT) 0.03 % nasal spray, Place 2 sprays into both nostrils every 12 (twelve) hours., Disp: 30 mL, Rfl: 12   ipratropium-albuterol (DUONEB) 0.5-2.5 (3) MG/3ML SOLN, SMARTSIG:1 Ampule(s) Via Nebulizer 3 Times Daily PRN (Patient taking differently: Take 3 mLs by nebulization See admin instructions. SMARTSIG:1 Ampule(s) Via Nebulizer 3 Times Daily PRN), Disp: 360 mL, Rfl: 6   predniSONE (DELTASONE) 10 MG tablet, Take 4 tablets (40 mg total) by mouth daily with breakfast., Disp: 20 tablet, Rfl: 0   simvastatin (ZOCOR) 40 MG tablet, Take 1 tablet (40 mg total) by mouth at bedtime. (Patient taking differently: Take 40 mg by mouth daily at 6 PM.), Disp: 90 tablet, Rfl: 3   montelukast (SINGULAIR) 10 MG tablet, Take 1 tablet (10 mg total) by mouth at bedtime.,  Disp: 30 tablet, Rfl: 11

## 2022-09-22 DIAGNOSIS — E785 Hyperlipidemia, unspecified: Secondary | ICD-10-CM | POA: Diagnosis not present

## 2022-09-22 DIAGNOSIS — D75839 Thrombocytosis, unspecified: Secondary | ICD-10-CM | POA: Diagnosis not present

## 2022-09-22 DIAGNOSIS — J449 Chronic obstructive pulmonary disease, unspecified: Secondary | ICD-10-CM | POA: Diagnosis not present

## 2022-10-02 ENCOUNTER — Other Ambulatory Visit: Payer: Self-pay

## 2022-10-02 DIAGNOSIS — Z122 Encounter for screening for malignant neoplasm of respiratory organs: Secondary | ICD-10-CM

## 2022-10-02 DIAGNOSIS — Z87891 Personal history of nicotine dependence: Secondary | ICD-10-CM

## 2022-10-22 DIAGNOSIS — J449 Chronic obstructive pulmonary disease, unspecified: Secondary | ICD-10-CM | POA: Diagnosis not present

## 2022-10-22 DIAGNOSIS — E785 Hyperlipidemia, unspecified: Secondary | ICD-10-CM | POA: Diagnosis not present

## 2022-10-22 DIAGNOSIS — D75839 Thrombocytosis, unspecified: Secondary | ICD-10-CM | POA: Diagnosis not present

## 2022-11-12 ENCOUNTER — Encounter: Payer: Self-pay | Admitting: Acute Care

## 2022-11-12 ENCOUNTER — Ambulatory Visit (INDEPENDENT_AMBULATORY_CARE_PROVIDER_SITE_OTHER): Payer: BC Managed Care – PPO | Admitting: Acute Care

## 2022-11-12 DIAGNOSIS — Z87891 Personal history of nicotine dependence: Secondary | ICD-10-CM

## 2022-11-12 NOTE — Progress Notes (Signed)
Virtual Visit via Telephone Note  I connected with Stephan Draughn on 11/12/22 at  9:30 AM EST by telephone and verified that I am speaking with the correct person using two identifiers.  Location: Patient:  At home Provider:  [address] [place], [place], [address]    I discussed the limitations, risks, security and privacy concerns of performing an evaluation and management service by telephone and the availability of in person appointments. I also discussed with the patient that there may be a patient responsible charge related to this service. The patient expressed understanding and agreed to proceed.    Shared Decision Making Visit Lung Cancer Screening Program [phone])   Eligibility: Age 66 y.o. Pack Years Smoking History Calculation 45 pack year smoking history (# packs/per year x # years smoked) Recent History of coughing up blood  no Unexplained weight loss? no ( >Than 15 pounds within the last 6 months ) Prior History Lung / other cancer no (Diagnosis within the last 5 years already requiring surveillance chest CT Scans). Smoking Status Former Smoker Former Smokers: Years since quit: 4 years  Quit Date:  2019  Visit Components: Discussion included one or more decision making aids. yes Discussion included risk/benefits of screening. yes Discussion included potential follow up diagnostic testing for abnormal scans. yes Discussion included meaning and risk of over diagnosis. yes Discussion included meaning and risk of False Positives. yes Discussion included meaning of total radiation exposure. yes  Counseling Included: Importance of adherence to annual lung cancer LDCT screening. yes Impact of comorbidities on ability to participate in the program. yes Ability and willingness to under diagnostic treatment. yes  Smoking Cessation Counseling: Current Smokers:  Discussed importance of smoking cessation. yes Information about tobacco cessation classes and  interventions provided to patient. yes Patient provided with "ticket" for LDCT Scan. yes Symptomatic Patient. no  Counseling NA Diagnosis Code: Tobacco Use Z72.0 Asymptomatic Patient yes  Counseling (Intermediate counseling: > three minutes counseling) E7517 Former Smokers:  Discussed the importance of maintaining cigarette abstinence. yes Diagnosis Code: Personal History of Nicotine Dependence. G01.749 Information about tobacco cessation classes and interventions provided to patient. Yes Patient provided with "ticket" for LDCT Scan. yes Written Order for Lung Cancer Screening with LDCT placed in Epic. Yes (CT Chest Lung Cancer Screening Low Dose W/O CM) [id number] Z12.2-Screening of respiratory organs Z87.891-Personal history of nicotine dependence  I spent 25 minutes of face to face time/virtual visit time  with  Ms. Virden discussing the risks and benefits of lung cancer screening. We took the time to pause the power point at intervals to allow for questions to be asked and answered to ensure understanding. We discussed that she had taken the single most powerful action possible to decrease her risk of developing lung cancer when she quit smoking. I counseled her to remain smoke free, and to contact me if she ever had the desire to smoke again so that I can provide resources and tools to help support the effort to remain smoke free. We discussed the time and location of the scan, and that either  Abigail Miyamoto RN, Karlton Lemon, RN or I  or I will call / send a letter with the results within  24-72 hours of receiving them. She has the office contact information in the event she needs to speak with me,  she verbalized understanding of all of the above and had no further questions upon leaving the office.     I explained to the patient  that there has been a high incidence of coronary artery disease noted on these exams. I explained that this is a non-gated exam therefore degree or severity cannot  be determined. This patient is on statin therapy. I have asked the patient to follow-up with their PCP regarding any incidental finding of coronary artery disease and management with diet or medication as they feel is clinically indicated. The patient verbalized understanding of the above and had no further questions.     Bevelyn Ngo, NP 11/12/2022

## 2022-11-12 NOTE — Patient Instructions (Signed)
Thank you for participating in the [facility]. It was our pleasure to meet you today. We will call you with the results of your scan within the next few days. Your scan will be assigned a Lung RADS category score by the physicians reading the scans.  This Lung RADS score determines follow up scanning.  See below for description of categories, and follow up screening recommendations. We will be in touch to schedule your follow up screening annually or based on recommendations of our providers. We will fax a copy of your scan results to your Primary Care Physician, or the physician who referred you to the program, to ensure they have the results. Please call the office if you have any questions or concerns regarding your scanning experience or results.  Our office number is [phone]. Please speak with Denise Phelps, RN. , or  Denise Buckner RN, They are  our Lung Cancer Screening RN.'s If They are unavailable when you call, Please leave a message on the voice mail. We will return your call at our earliest convenience.This voice mail is monitored several times a day.  Remember, if your scan is normal, we will scan you annually as long as you continue to meet the criteria for the program. (Age 55-77, Current smoker or smoker who has quit within the last 15 years). If you are a smoker, remember, quitting is the single most powerful action that you can take to decrease your risk of lung cancer and other pulmonary, breathing related problems. We know quitting is hard, and we are here to help.  Please let us know if there is anything we can do to help you meet your goal of quitting. If you are a former smoker, congratulations. We are proud of you! Remain smoke free! Remember you can refer friends or family members through the number above.  We will screen them to make sure they meet criteria for the program. Thank you for helping us take better care of you by  participating in Lung Screening.  You can receive free nicotine replacement therapy ( patches, gum or mints) by calling 1-800-QUIT NOW. Please call so we can get you on the path to becoming  a non-smoker. I know it is hard, but you can do this!  Lung RADS Categories:  Lung RADS 1: no nodules or definitely non-concerning nodules.  Recommendation is for a repeat annual scan in 12 months.  Lung RADS 2:  nodules that are non-concerning in appearance and behavior with a very low likelihood of becoming an active cancer. Recommendation is for a repeat annual scan in 12 months.  Lung RADS 3: nodules that are probably non-concerning , includes nodules with a low likelihood of becoming an active cancer.  Recommendation is for a 6-month repeat screening scan. Often noted after an upper respiratory illness. We will be in touch to make sure you have no questions, and to schedule your 6-month scan.  Lung RADS 4 A: nodules with concerning findings, recommendation is most often for a follow up scan in 3 months or additional testing based on our provider's assessment of the scan. We will be in touch to make sure you have no questions and to schedule the recommended 3 month follow up scan.  Lung RADS 4 B:  indicates findings that are concerning. We will be in touch with you to schedule additional diagnostic testing based on our provider's  assessment of the scan.  Other options for assistance in smoking cessation (   As covered by your insurance benefits)  Hypnosis for smoking cessation  [facility] [phone]  Acupuncture for smoking cessation  [facility] [phone]   

## 2022-11-18 ENCOUNTER — Ambulatory Visit (HOSPITAL_BASED_OUTPATIENT_CLINIC_OR_DEPARTMENT_OTHER)
Admission: RE | Admit: 2022-11-18 | Discharge: 2022-11-18 | Disposition: A | Discharge status: Home / Self Care | Payer: BC Managed Care – PPO | Source: Ambulatory Visit | Attending: Acute Care | Admitting: Acute Care

## 2022-11-18 ENCOUNTER — Encounter (HOSPITAL_BASED_OUTPATIENT_CLINIC_OR_DEPARTMENT_OTHER): Payer: Self-pay

## 2022-11-18 DIAGNOSIS — Z122 Encounter for screening for malignant neoplasm of respiratory organs: Secondary | ICD-10-CM | POA: Diagnosis not present

## 2022-11-18 DIAGNOSIS — J439 Emphysema, unspecified: Secondary | ICD-10-CM | POA: Diagnosis not present

## 2022-11-18 DIAGNOSIS — I700 Atherosclerosis of aorta: Secondary | ICD-10-CM | POA: Diagnosis not present

## 2022-11-18 DIAGNOSIS — I2510 Atherosclerotic heart disease of native coronary artery without angina pectoris: Secondary | ICD-10-CM | POA: Diagnosis not present

## 2022-11-18 DIAGNOSIS — Z87891 Personal history of nicotine dependence: Secondary | ICD-10-CM | POA: Diagnosis not present

## 2022-11-22 DIAGNOSIS — J449 Chronic obstructive pulmonary disease, unspecified: Secondary | ICD-10-CM | POA: Diagnosis not present

## 2022-11-22 DIAGNOSIS — D75839 Thrombocytosis, unspecified: Secondary | ICD-10-CM | POA: Diagnosis not present

## 2022-11-22 DIAGNOSIS — E785 Hyperlipidemia, unspecified: Secondary | ICD-10-CM | POA: Diagnosis not present

## 2022-11-24 NOTE — Telephone Encounter (Signed)
Mychart message sent by pt: Breanna Ramirez  P [facility] (supporting Bevelyn Ngo, NP)31 minutes ago (1:56 PM)    Good afternoon!  I'm seeing results of my chest CT and I have some questions.  It looks like I'm supposed to follow-up in 3 months for another scan, but I'm not comfortable with waiting.  Is there anything else we can do?      Routing to lung nodule pool.

## 2022-11-26 ENCOUNTER — Telehealth: Payer: Self-pay | Admitting: Acute Care

## 2022-11-26 DIAGNOSIS — R911 Solitary pulmonary nodule: Secondary | ICD-10-CM

## 2022-11-26 DIAGNOSIS — Z87891 Personal history of nicotine dependence: Secondary | ICD-10-CM

## 2022-11-26 NOTE — Telephone Encounter (Signed)
I have called the patient with the results of her low-dose screening CT.  I explained that her scan was read as a lung RADS 4A, suspicious.  She had near complete collapse of the middle right lung with possible endobronchial nodule versus mucous plugging in the proximal right middle lobe.  Additionally there is a possible endobronchial lesion in the right proximal middle lobe in addition to small solid pulmonary nodules measuring up to 3 mm.  Upon discussing with patient she told me that she has been sick.  She was prescribed Augmentin by the nurse practitioner at her work.  She took Augmentin 875 [id number] from November 6 through November 15.  She was prescribed a 10-day regiment which she states she completed.  She states she is less congested but continues to have wheezing.  I explained to her plan will be to set up a video visit within the next day or so, most likely she will need additional antibiotic and a prednisone taper.  Plan will also be for flutter valve and Mucinex as mucolytic.  We will do repeat CT 4 to 6 weeks after second round of antibiotics.  Denise please call patient and set her up for a video or televisit with me Friday at 1:30 PM.  This will need to be added to my schedule as that is my afternoon for calling scans.  Please place order for follow-up low-dose CT December 15 or 16 with follow-up in the office with me after scan has been completed to review results.  If this area has not cleared at that time she will most likely need a bronchoscopy.  Please fax results to PCP and explained plan for follow-up.  Thanks so much

## 2022-11-26 NOTE — Telephone Encounter (Signed)
I have attempted to call the patient with the results of their  Low Dose CT Chest Lung cancer screening scan. There was no answer. I have left a HIPPA compliant VM requesting the patient call the office for the scan results. I included the office contact information in the message. We will await their  return call. If no return call we will continue to call until patient is contacted.   Breanna Ramirez, her scan looks like mucus plugging and possible infection. I need to get her into the office for a visit. I need to treat her with antibiotic and repeat the scan in 4-6 weeks. If it still looks the same, she will need a bronch. I will let her know this when she calls me back.  I am also going to see if she qualifies for  a blood test that is offered commercially that checks biomarkers for lung cancer. If she qualifies, I will also offer her that.

## 2022-11-26 NOTE — Telephone Encounter (Signed)
New order placed for [facility] LDCT nodule follow up for Dec 15 or Dec 13, 2022.  Patient has no active PCP.  Televisit has been scheduled/confirmed for  11/28/22 per Kandice Robinsons, NP.

## 2022-11-27 NOTE — Telephone Encounter (Signed)
Follow up CT ordered for Dec 18 per pt availability and OV with SG on Dec 16, 2022.  Appts confirmed

## 2022-11-28 ENCOUNTER — Ambulatory Visit (INDEPENDENT_AMBULATORY_CARE_PROVIDER_SITE_OTHER): Payer: BC Managed Care – PPO | Admitting: Acute Care

## 2022-11-28 ENCOUNTER — Encounter: Payer: Self-pay | Admitting: Acute Care

## 2022-11-28 DIAGNOSIS — F17210 Nicotine dependence, cigarettes, uncomplicated: Secondary | ICD-10-CM

## 2022-11-28 DIAGNOSIS — J069 Acute upper respiratory infection, unspecified: Secondary | ICD-10-CM

## 2022-11-28 DIAGNOSIS — F17200 Nicotine dependence, unspecified, uncomplicated: Secondary | ICD-10-CM

## 2022-11-28 MED ORDER — PREDNISONE 10 MG PO TABS: ORAL_TABLET | ORAL | 0 refills | Status: DC

## 2022-11-28 MED ORDER — AMOXICILLIN-POT CLAVULANATE 875-125 MG PO TABS: 1.0000 | ORAL_TABLET | Freq: Two times a day (BID) | ORAL | 0 refills | Status: DC

## 2022-11-28 NOTE — Telephone Encounter (Signed)
See other telephone note from 11/26/22

## 2022-11-28 NOTE — Progress Notes (Signed)
Virtual Visit via Telephone Note  I connected with Breanna Ramirez on 11/28/22 at  1:30 PM EST by telephone and verified that I am speaking with the correct person using two identifiers.  Location: Patient:  At home Provider: [address], [place], [place], [address]    I discussed the limitations, risks, security and privacy concerns of performing an evaluation and management service by telephone and the availability of in person appointments. I also discussed with the patient that there may be a patient responsible charge related to this service. The patient expressed understanding and agreed to proceed.   History of Present Illness: Pt. Is followed through the lung cancer screening program. Former smoker , quit 2019 with a 45 pack year smoking history. Her last 2 scans were read as LR 0 due to near complete collapse of the middle right lung with possible endobronchial nodule versus mucous plugging in the proximal right middle lobe.  Additionally there is a possible endobronchial lesion in the right proximal middle lobe in addition to small solid pulmonary nodules measuring up to 3 mm. Upon discussing with patient she told me that she has been sick. She was prescribed Augmentin by the nurse practitioner at her work. She took Augmentin 875 125 from November 6 through November 15. She was prescribed a 10-day regiment which she states she completed. She states she is less congested but continues to have wheezing. Secretions remain discolored.  Plan is to re treat with Augmentin and a prednisone taper, and repeat Ct Chest in 4-6 weeks . She is in agreement with these plans.    Observations/Objective: Sinus congestion , wheezing  Assessment and Plan: Lung RADS 0 Screening scan x 2 Slow to resolve URI Plan  We will send in a prescription for Augmentin  x 7 days. This will be the second round of antibiotics since you have been sick. Please take Probiotics while on antibiotics, or eat  Activia Yogurt once daily. We will send in a prednisone taper for wheezing. Prednisone taper; 10 mg tablets: 4 tabs x 2 days, 3 tabs x 2 days, 2 tabs x 2 days 1 tab x 2 days then stop.  Take all medication until gone Try Sudafed Over the counter for congestion , or use a Nettie Pott.  Add plain Mucinex 1200 mg daily to thin secretions.  Follow up CT Chest 12/15/2022 with follow up 12/16/2022 in the office with Maralyn Sago NP. We will provide a flutter valve at follow up for maintenance.  Please contact office for sooner follow up if symptoms do not improve or worsen or seek emergency care    Follow Up Instructions: Follow up CT Chest 12/15/2022 with follow up 12/16/2022 in the office with Maralyn Sago NP.   I discussed the assessment and treatment plan with the patient. The patient was provided an opportunity to ask questions and all were answered. The patient agreed with the plan and demonstrated an understanding of the instructions.   The patient was advised to call back or seek an in-person evaluation if the symptoms worsen or if the condition fails to improve as anticipated.  I provided 30 minutes of non-face-to-face time during this encounter.   Bevelyn Ngo, NP  11/28/2022

## 2022-11-28 NOTE — Patient Instructions (Addendum)
We will send in a prescription for Augmentin  x 7 days. This will be the second round of antibiotics since you have been sick. Please take Probiotics while on antibiotics, or eat Activia Yogurt once daily. We will send in a prednisone taper for wheezing. Prednisone taper; 10 mg tablets: 4 tabs x 2 days, 3 tabs x 2 days, 2 tabs x 2 days 1 tab x 2 days then stop.  Take all medication until gone Try Sudafed Over the counter for congestion , or use a Nettie Pott.  Add plain Mucinex 1200 mg daily to thin secretions.  Follow up CT Chest 12/15/2022 with follow up 12/16/2022 in the office with Maralyn Sago NP. We will provide a flutter valve at follow up for maintenance.  Please contact office for sooner follow up if symptoms do not improve or worsen or seek emergency care

## 2022-12-03 ENCOUNTER — Ambulatory Visit (HOSPITAL_BASED_OUTPATIENT_CLINIC_OR_DEPARTMENT_OTHER): Admission: RE | Admit: 2022-12-03 | Payer: BC Managed Care – PPO | Source: Ambulatory Visit

## 2022-12-15 ENCOUNTER — Other Ambulatory Visit: Payer: Self-pay

## 2022-12-15 ENCOUNTER — Ambulatory Visit (HOSPITAL_BASED_OUTPATIENT_CLINIC_OR_DEPARTMENT_OTHER)
Admission: RE | Admit: 2022-12-15 | Discharge: 2022-12-15 | Disposition: A | Discharge status: Home / Self Care | Payer: BC Managed Care – PPO | Source: Ambulatory Visit | Attending: Acute Care | Admitting: Acute Care

## 2022-12-15 DIAGNOSIS — Z87891 Personal history of nicotine dependence: Secondary | ICD-10-CM | POA: Insufficient documentation

## 2022-12-15 DIAGNOSIS — R911 Solitary pulmonary nodule: Secondary | ICD-10-CM | POA: Diagnosis not present

## 2022-12-16 ENCOUNTER — Ambulatory Visit: Payer: BC Managed Care – PPO | Admitting: Acute Care

## 2022-12-16 ENCOUNTER — Encounter: Payer: Self-pay | Admitting: Acute Care

## 2022-12-16 ENCOUNTER — Telehealth: Payer: Self-pay | Admitting: Acute Care

## 2022-12-16 VITALS — BP 114/58 | HR 76 | Temp 98.1°F | Ht 65.0 in | Wt 139.6 lb

## 2022-12-16 DIAGNOSIS — Z122 Encounter for screening for malignant neoplasm of respiratory organs: Secondary | ICD-10-CM

## 2022-12-16 DIAGNOSIS — R9389 Abnormal findings on diagnostic imaging of other specified body structures: Secondary | ICD-10-CM | POA: Diagnosis not present

## 2022-12-16 DIAGNOSIS — J449 Chronic obstructive pulmonary disease, unspecified: Secondary | ICD-10-CM | POA: Diagnosis not present

## 2022-12-16 DIAGNOSIS — Z87891 Personal history of nicotine dependence: Secondary | ICD-10-CM

## 2022-12-16 MED ORDER — IPRATROPIUM BROMIDE 0.03 % NA SOLN: 2.0000 | Freq: Two times a day (BID) | NASAL | 12 refills | Status: DC

## 2022-12-16 NOTE — Telephone Encounter (Signed)
I have called the patient with the results of her low dose Ct Chest . It was read as a LR 2.  Lungs/Pleura: No pleural fluid or airspace disease. Moderate centrilobular emphysema. Bilateral mosaic attenuation. Right middle lobe central airway is now patent. No endobronchial lesion identified at this time. There has been interval re-expansion of the right middle lobe with residual subsegmental atelectasis noted anterior and medially. Additional, scattered bandlike areas of subsegmental atelectasis versus scarring is identified bilaterally. There are 3 small lung nodules identified on today's study. The largest is in the periphery of the right lower lobe with a mean derived diameter of 3.3 mm. No suspicious lung nodules identified at this time.  Denise . 12 month follow up LDCT and fax results to PCP. Thanks so much

## 2022-12-16 NOTE — Patient Instructions (Addendum)
Abnormal Screening CT Chest It is good to see you today. We will continue the Mucinex 1200 mg daily with a full glass of water.  Start using the flutter valve daily. Use multiple times daily, 4-6 blows each time.  Use this for chest congestion as maintenance.  I will call you with the results of the CT Chest. When it is read.  We will determine follow up schedule after we see results.  Continue Trelegy 1 puff once daily . Rinse mouth after use.  Continue Nexium daily We will refill your Atrovent nasal spray.  Use twice daily, 2 sprays each nare.  Continue Duo Nebs as needed for breakthrough shortness of breath or wheezing.  Follow up CT Chest Screening due 11/2023 Follow up with Dr. Francine Graven 01/2023 as he specified in August.  Please contact office for sooner follow up if symptoms do not improve or worsen or seek emergency care

## 2022-12-16 NOTE — Progress Notes (Addendum)
History of Present Illness Breanna Ramirez is a 66 y.o. female former smoker with COPD and GERD who returns to pulmonary clinic for follow up of abnormal screening CT Chest done    Synopsis Pt. Is followed through the lung cancer screening program. Former smoker , quit 2019 with a 45 pack year smoking history. Her last 2 scans were read as LR 0 due to near complete collapse of the middle right lung with possible endobronchial nodule versus mucous plugging in the proximal right middle lobe.  Additionally there is a possible endobronchial lesion in the right proximal middle lobe in addition to small solid pulmonary nodules measuring up to 3 mm. Upon discussing with patient she told me that she has been sick. She was prescribed Augmentin by the nurse practitioner at her work. She took Augmentin 875 125 from November 6 through November 15. She was prescribed a 10-day regiment which she states she completed. She states she is less congested but continues to have wheezing. Secretions remain discolored.  Plan is to re treat with Augmentin and a prednisone taper, and repeat Ct Chest in 4-6 weeks . She is in agreement with these plans.    12/16/2022 Pt. Presents for follow up.She states she has been feeling better. She completed her second round of Augmentin and prednisone taper. Wheezing is better .She does have a cough. Worse at night when she lays down. She is taking Nexium for GERD, and I reminded her to follow the GERD diet , to avoid foos that make GERD worse. She states she coughs less  when she sleeps with her head elevated.  She had her follow up Chest CT 12/18, yesterday, and it has not been read. We will call her with the results once it has been read. Follow up will be determined by the read. I do hear good air movement in all lobes on both the right and left today, and to my eye , review of the scan shows improvement and re-expansion of the right middle lobe. . This was confirmed when the scan was  read, see results below.    Test Results: 12/15/2022 Low Dose CT Chest Lungs/Pleura: No pleural fluid or airspace disease. Moderate centrilobular emphysema. Bilateral mosaic attenuation. Right middle lobe central airway is now patent. No endobronchial lesion identified at this time. There has been interval re-expansion of the right middle lobe with residual subsegmental atelectasis noted anterior and medially. Additional, scattered bandlike areas of subsegmental atelectasis versus scarring is identified bilaterally.   There are 3 small lung nodules identified on today's study. The largest is in the periphery of the right lower lobe with a mean derived diameter of 3.3 mm. No suspicious lung nodules identified at this time.  Lung-RADS 2, benign appearance or behavior. Continue annual screening with low-dose chest CT without contrast in 12 months. Coronary artery calcifications. Aortic Atherosclerosis (ICD10-I70.0) and Emphysema (ICD10-J43.9).     Latest Ref Rng & Units 05/19/2022    3:45 AM 05/18/2022   10:10 AM 10/31/2021   11:31 AM  CBC  WBC 4.0 - 10.5 K/uL 9.9  13.5    Hemoglobin 12.0 - 15.0 g/dL 8.7  9.6  86.5   Hematocrit 36.0 - 46.0 % 29.5  33.1  34.0   Platelets 150 - 400 K/uL 393  473         Latest Ref Rng & Units 05/19/2022    3:45 AM 05/18/2022   10:10 AM 10/31/2021   11:31 AM  BMP  Glucose  70 - 99 mg/dL 762  831    BUN 8 - 23 mg/dL 16  18    Creatinine 5.17 - 1.00 mg/dL 6.16  0.73    Sodium 710 - 145 mmol/L 141  138  139   Potassium 3.5 - 5.1 mmol/L 4.4  4.2  3.7   Chloride 98 - 111 mmol/L 105  104    CO2 22 - 32 mmol/L 29  24    Calcium 8.9 - 10.3 mg/dL 9.3  9.9      BNP    Component Value Date/Time   BNP 80.4 05/18/2022 1010    ProBNP No results found for: "PROBNP"  PFT    Component Value Date/Time   FEV1PRE 1.28 05/12/2022 1047   FEV1POST 1.36 05/12/2022 1047   FVCPRE 1.67 05/12/2022 1047   FVCPOST 1.72 05/12/2022 1047   TLC 4.06 05/12/2022  1047   DLCOUNC 8.38 05/12/2022 1047   PREFEV1FVCRT 77 05/12/2022 1047   PSTFEV1FVCRT 79 05/12/2022 1047    CT CHEST LUNG CA SCREEN LOW DOSE W/O CM  Result Date: 11/19/2022 CLINICAL DATA:  Former smoker with 45 pack-year history EXAM: CT CHEST WITHOUT CONTRAST LOW-DOSE FOR LUNG CANCER SCREENING TECHNIQUE: Multidetector CT imaging of the chest was performed following the standard protocol without IV contrast. RADIATION DOSE REDUCTION: This exam was performed according to the departmental dose-optimization program which includes automated exposure control, adjustment of the mA and/or kV according to patient size and/or use of iterative reconstruction technique. COMPARISON:  Chest CT dated October 31, 2021 FINDINGS: Cardiovascular: Normal heart size. Pericardial effusion. Moderate left main and three-vessel coronary artery calcifications. Normal caliber thoracic aorta with severe calcified plaque. Mediastinum/Nodes: Esophagus and thyroid are unremarkable. No pathologically enlarged lymph nodes seen in the chest. Lungs/Pleura: Central airways are patent. Bilateral mosaic attenuation. Moderate centrilobular emphysema. Linear opacity of the left lobe, likely sequela of infection or aspiration. Near-complete collapse of the right middle lobe, increased when compared with prior exam. possible endobronchial lesion of the proximal right middle lobe seen on series 3, image 172. Small solid pulmonary nodules, reference nodule of the right upper lobe measuring 3 mm on image 62. Upper Abdomen: No acute abnormality. Musculoskeletal: No chest wall mass or suspicious bone lesions identified. IMPRESSION: 1. Near complete collapse of the right middle lobe with possible endobronchial nodule versus mucous plugging of the proximal right middle lobe. Lung-RADS 4AS, suspicious. Follow up low-dose chest CT without contrast in 3 months (please use the following order, "CT CHEST LCS NODULE FOLLOW-UP W/O CM") is recommended.  Alternatively, PET may be considered when there is a solid component 49mm or larger. S modifier for coronary artery calcifications. 2. Bilateral mosaic attenuation, findings can be seen in the setting of small airways disease. 3. Moderate left main and three-vessel coronary artery calcifications, recommend ASCVD risk assessment. 4. Aortic Atherosclerosis (ICD10-I70.0) and Emphysema (ICD10-J43.9). Electronically Signed   By: Allegra Lai M.D.   On: 11/19/2022 19:25    Past medical hx Past Medical History:  Diagnosis Date   COPD (chronic obstructive pulmonary disease) (HCC)    Essential hypertension    GERD (gastroesophageal reflux disease)    Pneumonia 05/2017   Respiratory failure with hypoxia (HCC) 05/2017     Social History   Tobacco Use   Smoking status: Former    Packs/day: 1.00    Years: 45.00    Total pack years: 45.00    Types: Cigarettes    Quit date: 12/29/2017    Years since quitting: 4.9  Smokeless tobacco: Never  Vaping Use   Vaping Use: Never used  Substance Use Topics   Alcohol use: No   Drug use: No    Ms.Street reports that she quit smoking about 4 years ago. Her smoking use included cigarettes. She has a 45.00 pack-year smoking history. She has never used smokeless tobacco. She reports that she does not drink alcohol and does not use drugs.  Tobacco Cessation: Former smoker , quit in 2019 with a 45 pack year smoking history   Past surgical hx, Family hx, Social hx all reviewed.  Current Outpatient Medications on File Prior to Visit  Medication Sig   albuterol (PROVENTIL HFA;VENTOLIN HFA) 108 (90 Base) MCG/ACT inhaler Inhale 1-2 puffs into the lungs every 6 (six) hours as needed for wheezing or shortness of breath.   esomeprazole (NEXIUM) 40 MG capsule Take 1 capsule (40 mg total) by mouth daily.   fluticasone (FLONASE) 50 MCG/ACT nasal spray Place 1 spray into both nostrils 2 (two) times daily.   Fluticasone-Umeclidin-Vilant (TRELEGY ELLIPTA) 200-62.5-25  MCG/ACT AEPB Inhale 1 puff into the lungs daily.   hydrochlorothiazide (HYDRODIURIL) 25 MG tablet Take 1 tablet (25 mg total) by mouth daily.   ipratropium-albuterol (DUONEB) 0.5-2.5 (3) MG/3ML SOLN SMARTSIG:1 Ampule(s) Via Nebulizer 3 Times Daily PRN (Patient taking differently: Take 3 mLs by nebulization See admin instructions. SMARTSIG:1 Ampule(s) Via Nebulizer 3 Times Daily PRN)   montelukast (SINGULAIR) 10 MG tablet Take 1 tablet (10 mg total) by mouth at bedtime.   simvastatin (ZOCOR) 40 MG tablet Take 1 tablet (40 mg total) by mouth at bedtime. (Patient taking differently: Take 40 mg by mouth daily at 6 PM.)   amoxicillin-clavulanate (AUGMENTIN) 875-125 MG tablet Take 1 tablet by mouth 2 (two) times daily. (Patient not taking: Reported on 12/16/2022)   ferrous sulfate 325 (65 FE) MG tablet Take 1 tablet (325 mg total) by mouth daily.   predniSONE (DELTASONE) 10 MG tablet Prednisone taper; 10 mg tablets: 4 tabs x 2 days, 3 tabs x 2 days, 2 tabs x 2 days 1 tab x 2 days then stop. (Patient not taking: Reported on 12/16/2022)   No current facility-administered medications on file prior to visit.     Allergies  Allergen Reactions   Codeine Nausea And Vomiting    Review Of Systems:  Constitutional:   No  weight loss, night sweats,  Fevers, chills, fatigue, or  lassitude.  HEENT:   No headaches,  Difficulty swallowing,  Tooth/dental problems, or  Sore throat,                No sneezing, itching, ear ache, nasal congestion, post nasal drip,   CV:  No chest pain,  Orthopnea, PND, swelling in lower extremities, anasarca, dizziness, palpitations, syncope.   GI  No heartburn, indigestion, abdominal pain, nausea, vomiting, diarrhea, change in bowel habits, loss of appetite, bloody stools.   Resp: + baseline  shortness of breath with exertion less at rest.  + excess mucus, + productive cough with thick secretions ,  No non-productive cough,  No coughing up of blood.  No change in color of mucus.   + but improved  wheezing.  No chest wall deformity  Skin: no rash or lesions.  GU: no dysuria, change in color of urine, no urgency or frequency.  No flank pain, no hematuria   MS:  No joint pain or swelling.  No decreased range of motion.  No back pain.  Psych:  No change in mood or affect.  No depression or anxiety.  No memory loss.   Vital Signs BP (!) 114/58 (BP Location: Left Arm, Patient Position: Sitting, Cuff Size: Normal)   Pulse 76   Temp 98.1 F (36.7 C) (Oral)   Ht 5\' 5"  (1.651 m)   Wt 139 lb 9.6 oz (63.3 kg)   SpO2 96%   BMI 23.23 kg/m    Physical Exam:  General- No distress,  A&Ox3, pleasant ENT: No sinus tenderness, TM clear, pale nasal mucosa, no oral exudate,no post nasal drip, no LAN Cardiac: S1, S2, regular rate and rhythm, no murmur Chest: No wheeze/ rales/ dullness; no accessory muscle use, no nasal flaring, no sternal retractions Abd.: Soft Non-tender, ND, BS +, Body mass index is 23.23 kg/m.  Ext: No clubbing cyanosis, edema Neuro:  normal strength, MAE x 4, A&O x 3, approrpiate Skin: No rashes, warm and dry, no lesions  Psych: normal mood and behavior   Assessment/Plan Abnormal Screening CT Chest 11/19/2022 LR 4 A,  Near complete collapse of the right middle lobe with possible endobronchial nodule versus mucous plugging of the proximal right middle lobe. Treated with Augmentin and pred taper x 2 with clinical; improvement  Repeat CT chest 12/18 pending read, but does look improved on my read Concurrent COPD Flare treated as above Plan We will continue the Mucinex 1200 mg daily with a full glass of water.  Start using the flutter valve daily. Use multiple times daily, 4-6 blows each time.  Use this for chest congestion as maintenance.  I will call you with the results of the CT Chest. When it is read.  We will determine follow up schedule after we see results.  Continue Trelegy 1 puff once daily . Rinse mouth after use.  Continue Nexium  daily We will refill your Atrovent nasal spray.  Use twice daily, 2 sprays each nare.  Continue Duo Nebs as needed for breakthrough shortness of breath or wheezing.  Follow up CT Chest Screening due 11/2023 Follow up with Dr. 12/2023 01/2023 as he specified in August.  Please contact office for sooner follow up if symptoms do not improve or worsen or seek emergency care    I spent 40 minutes dedicated to the care of this patient on the date of this encounter to include pre-visit review of records, face-to-face time with the patient discussing conditions above, post visit ordering of testing, clinical documentation with the electronic health record, making appropriate referrals as documented, and communicating necessary information to the patient's healthcare team.    [place], NP 12/16/2022  9:32 AM

## 2022-12-17 NOTE — Addendum Note (Signed)
Addended by: Abigail Miyamoto D on: 12/17/2022 08:31 AM   Modules accepted: Orders

## 2022-12-17 NOTE — Telephone Encounter (Signed)
CT results faxed to PCP. Order placed for 12 mth follow up lung screening CT.  

## 2022-12-22 DIAGNOSIS — D75839 Thrombocytosis, unspecified: Secondary | ICD-10-CM | POA: Diagnosis not present

## 2022-12-22 DIAGNOSIS — E785 Hyperlipidemia, unspecified: Secondary | ICD-10-CM | POA: Diagnosis not present

## 2022-12-22 DIAGNOSIS — J449 Chronic obstructive pulmonary disease, unspecified: Secondary | ICD-10-CM | POA: Diagnosis not present

## 2023-01-22 DIAGNOSIS — J449 Chronic obstructive pulmonary disease, unspecified: Secondary | ICD-10-CM | POA: Diagnosis not present

## 2023-02-22 DIAGNOSIS — E785 Hyperlipidemia, unspecified: Secondary | ICD-10-CM | POA: Diagnosis not present

## 2023-02-22 DIAGNOSIS — D75839 Thrombocytosis, unspecified: Secondary | ICD-10-CM | POA: Diagnosis not present

## 2023-02-22 DIAGNOSIS — J449 Chronic obstructive pulmonary disease, unspecified: Secondary | ICD-10-CM | POA: Diagnosis not present

## 2023-03-23 DIAGNOSIS — D75839 Thrombocytosis, unspecified: Secondary | ICD-10-CM | POA: Diagnosis not present

## 2023-03-23 DIAGNOSIS — E785 Hyperlipidemia, unspecified: Secondary | ICD-10-CM | POA: Diagnosis not present

## 2023-03-23 DIAGNOSIS — J449 Chronic obstructive pulmonary disease, unspecified: Secondary | ICD-10-CM | POA: Diagnosis not present

## 2023-04-23 DIAGNOSIS — J449 Chronic obstructive pulmonary disease, unspecified: Secondary | ICD-10-CM | POA: Diagnosis not present

## 2023-04-23 DIAGNOSIS — E785 Hyperlipidemia, unspecified: Secondary | ICD-10-CM | POA: Diagnosis not present

## 2023-04-23 DIAGNOSIS — D75839 Thrombocytosis, unspecified: Secondary | ICD-10-CM | POA: Diagnosis not present

## 2023-05-11 ENCOUNTER — Emergency Department (HOSPITAL_COMMUNITY): Payer: BC Managed Care – PPO

## 2023-05-11 ENCOUNTER — Inpatient Hospital Stay (HOSPITAL_COMMUNITY)
Admission: EM | Admit: 2023-05-11 | Discharge: 2023-05-14 | DRG: 193 | Disposition: A | Discharge status: Home / Self Care | Payer: BC Managed Care – PPO | Attending: Internal Medicine | Admitting: Internal Medicine

## 2023-05-11 ENCOUNTER — Other Ambulatory Visit: Payer: Self-pay

## 2023-05-11 DIAGNOSIS — E785 Hyperlipidemia, unspecified: Secondary | ICD-10-CM | POA: Diagnosis not present

## 2023-05-11 DIAGNOSIS — J189 Pneumonia, unspecified organism: Secondary | ICD-10-CM | POA: Diagnosis not present

## 2023-05-11 DIAGNOSIS — J9601 Acute respiratory failure with hypoxia: Secondary | ICD-10-CM | POA: Diagnosis not present

## 2023-05-11 DIAGNOSIS — J441 Chronic obstructive pulmonary disease with (acute) exacerbation: Secondary | ICD-10-CM | POA: Diagnosis present

## 2023-05-11 DIAGNOSIS — I959 Hypotension, unspecified: Secondary | ICD-10-CM | POA: Diagnosis present

## 2023-05-11 DIAGNOSIS — I10 Essential (primary) hypertension: Secondary | ICD-10-CM | POA: Diagnosis present

## 2023-05-11 DIAGNOSIS — Z79899 Other long term (current) drug therapy: Secondary | ICD-10-CM | POA: Diagnosis not present

## 2023-05-11 DIAGNOSIS — T380X5A Adverse effect of glucocorticoids and synthetic analogues, initial encounter: Secondary | ICD-10-CM | POA: Diagnosis not present

## 2023-05-11 DIAGNOSIS — K219 Gastro-esophageal reflux disease without esophagitis: Secondary | ICD-10-CM | POA: Diagnosis not present

## 2023-05-11 DIAGNOSIS — Z885 Allergy status to narcotic agent status: Secondary | ICD-10-CM

## 2023-05-11 DIAGNOSIS — Z87891 Personal history of nicotine dependence: Secondary | ICD-10-CM

## 2023-05-11 DIAGNOSIS — J168 Pneumonia due to other specified infectious organisms: Secondary | ICD-10-CM | POA: Diagnosis not present

## 2023-05-11 DIAGNOSIS — E876 Hypokalemia: Secondary | ICD-10-CM | POA: Diagnosis present

## 2023-05-11 DIAGNOSIS — J440 Chronic obstructive pulmonary disease with acute lower respiratory infection: Secondary | ICD-10-CM | POA: Diagnosis present

## 2023-05-11 DIAGNOSIS — Z7951 Long term (current) use of inhaled steroids: Secondary | ICD-10-CM | POA: Diagnosis not present

## 2023-05-11 LAB — CBC
HCT: 35.5 % — ABNORMAL LOW (ref 36.0–46.0)
Hemoglobin: 10.9 g/dL — ABNORMAL LOW (ref 12.0–15.0)
MCH: 26.2 pg (ref 26.0–34.0)
MCHC: 30.7 g/dL (ref 30.0–36.0)
MCV: 85.3 fL (ref 80.0–100.0)
Platelets: 409 10*3/uL — ABNORMAL HIGH (ref 150–400)
RBC: 4.16 MIL/uL (ref 3.87–5.11)
RDW: 16.1 % — ABNORMAL HIGH (ref 11.5–15.5)
WBC: 14.1 10*3/uL — ABNORMAL HIGH (ref 4.0–10.5)
nRBC: 0 % (ref 0.0–0.2)

## 2023-05-11 LAB — BASIC METABOLIC PANEL
Anion gap: 10 (ref 5–15)
BUN: 17 mg/dL (ref 8–23)
CO2: 24 mmol/L (ref 22–32)
Calcium: 8.7 mg/dL — ABNORMAL LOW (ref 8.9–10.3)
Chloride: 103 mmol/L (ref 98–111)
Creatinine, Ser: 1.01 mg/dL — ABNORMAL HIGH (ref 0.44–1.00)
GFR, Estimated: >60 mL/min (ref >60)
Glucose, Bld: 128 mg/dL — ABNORMAL HIGH (ref 70–99)
Potassium: 3.4 mmol/L — ABNORMAL LOW (ref 3.5–5.1)
Sodium: 137 mmol/L (ref 135–145)

## 2023-05-11 MED ORDER — METHYLPREDNISOLONE SODIUM SUCC 125 MG IJ SOLR
125.0000 mg | Freq: Once | INTRAMUSCULAR | Status: AC
  Administered 2023-05-11: 125 mg via INTRAVENOUS
  Filled 2023-05-11: qty 2

## 2023-05-11 MED ORDER — ALBUTEROL SULFATE (2.5 MG/3ML) 0.083% IN NEBU
2.5000 mg | INHALATION_SOLUTION | RESPIRATORY_TRACT | Status: DC | PRN
  Administered 2023-05-11: 2.5 mg via RESPIRATORY_TRACT

## 2023-05-11 MED ORDER — TRAZODONE HCL 50 MG PO TABS
25.0000 mg | ORAL_TABLET | Freq: Every evening | ORAL | Status: DC | PRN
  Administered 2023-05-13: 25 mg via ORAL
  Filled 2023-05-11: qty 1

## 2023-05-11 MED ORDER — ENOXAPARIN SODIUM 40 MG/0.4ML IJ SOSY
40.0000 mg | PREFILLED_SYRINGE | INTRAMUSCULAR | Status: DC
  Administered 2023-05-14: 40 mg via SUBCUTANEOUS
  Filled 2023-05-11 (×3): qty 0.4

## 2023-05-11 MED ORDER — IPRATROPIUM-ALBUTEROL 0.5-2.5 (3) MG/3ML IN SOLN
3.0000 mL | Freq: Once | RESPIRATORY_TRACT | Status: AC
  Administered 2023-05-11: 3 mL via RESPIRATORY_TRACT
  Filled 2023-05-11: qty 3

## 2023-05-11 MED ORDER — FLUTICASONE PROPIONATE 50 MCG/ACT NA SUSP
1.0000 | Freq: Two times a day (BID) | NASAL | Status: DC
  Administered 2023-05-11 – 2023-05-14 (×6): 1 via NASAL
  Filled 2023-05-11: qty 16

## 2023-05-11 MED ORDER — MONTELUKAST SODIUM 10 MG PO TABS
10.0000 mg | ORAL_TABLET | Freq: Every day | ORAL | Status: DC
  Administered 2023-05-11 – 2023-05-13 (×3): 10 mg via ORAL
  Filled 2023-05-11 (×3): qty 1

## 2023-05-11 MED ORDER — ACETAMINOPHEN 650 MG RE SUPP: 650.0000 mg | Freq: Four times a day (QID) | RECTAL | Status: DC | PRN

## 2023-05-11 MED ORDER — ORAL CARE MOUTH RINSE: 15.0000 mL | OROMUCOSAL | Status: DC | PRN

## 2023-05-11 MED ORDER — METHYLPREDNISOLONE SODIUM SUCC 40 MG IJ SOLR
40.0000 mg | Freq: Two times a day (BID) | INTRAMUSCULAR | Status: DC
  Administered 2023-05-11 – 2023-05-13 (×4): 40 mg via INTRAVENOUS
  Filled 2023-05-11 (×4): qty 1

## 2023-05-11 MED ORDER — ONDANSETRON HCL 4 MG PO TABS: 4.0000 mg | ORAL_TABLET | Freq: Four times a day (QID) | ORAL | Status: DC | PRN

## 2023-05-11 MED ORDER — ACETAMINOPHEN 325 MG PO TABS: 650.0000 mg | ORAL_TABLET | Freq: Four times a day (QID) | ORAL | Status: DC | PRN

## 2023-05-11 MED ORDER — SODIUM CHLORIDE 0.9 % IV SOLN
500.0000 mg | Freq: Every day | INTRAVENOUS | Status: DC
  Administered 2023-05-11 – 2023-05-13 (×3): 500 mg via INTRAVENOUS
  Filled 2023-05-11 (×4): qty 5

## 2023-05-11 MED ORDER — PANTOPRAZOLE SODIUM 40 MG PO TBEC
40.0000 mg | DELAYED_RELEASE_TABLET | Freq: Every day | ORAL | Status: DC
  Administered 2023-05-12 – 2023-05-14 (×3): 40 mg via ORAL
  Filled 2023-05-11 (×3): qty 1

## 2023-05-11 MED ORDER — SODIUM CHLORIDE 0.9 % IV SOLN
1.0000 g | Freq: Once | INTRAVENOUS | Status: AC
  Administered 2023-05-11: 1 g via INTRAVENOUS
  Filled 2023-05-11: qty 10

## 2023-05-11 MED ORDER — SIMVASTATIN 40 MG PO TABS
40.0000 mg | ORAL_TABLET | Freq: Every day | ORAL | Status: DC
  Administered 2023-05-11 – 2023-05-13 (×3): 40 mg via ORAL
  Filled 2023-05-11 (×3): qty 1

## 2023-05-11 MED ORDER — IPRATROPIUM-ALBUTEROL 0.5-2.5 (3) MG/3ML IN SOLN
3.0000 mL | Freq: Four times a day (QID) | RESPIRATORY_TRACT | Status: DC
  Administered 2023-05-11 – 2023-05-12 (×3): 3 mL via RESPIRATORY_TRACT
  Filled 2023-05-11 (×4): qty 3

## 2023-05-11 MED ORDER — SODIUM CHLORIDE 0.9 % IV SOLN
1.0000 g | INTRAVENOUS | Status: DC
  Administered 2023-05-12 – 2023-05-14 (×3): 1 g via INTRAVENOUS
  Filled 2023-05-11 (×3): qty 10

## 2023-05-11 MED ORDER — ONDANSETRON HCL 4 MG/2ML IJ SOLN: 4.0000 mg | Freq: Four times a day (QID) | INTRAMUSCULAR | Status: DC | PRN

## 2023-05-11 NOTE — ED Notes (Signed)
ED TO INPATIENT HANDOFF REPORT  Name/Age/Gender Breanna Ramirez 67 y.o. female  Code Status    Code Status Orders  (From admission, onward)           Start     Ordered   05/11/23 1043  Full code  Continuous       Question:  By:  Answer:  Consent: discussion documented in EHR   05/11/23 1043           Code Status History     Date Active Date Inactive Code Status Order ID Comments User Context   05/18/2022 1234 05/21/2022 2034 Full Code [id number]  Bobette Mo, MD ED   10/21/2018 1954 10/23/2018 2037 Full Code [id number]  Marinda Elk, MD ED   06/18/2017 1517 06/22/2017 2146 Full Code [id number]  Darreld Mclean, MD ED       Home/SNF/Other Home  Chief Complaint CAP (community acquired pneumonia) [J18.9]  Level of Care/Admitting Diagnosis ED Disposition     ED Disposition  Admit   Condition  --   Comment  Hospital Area: [facility] [[id number]]  Level of Care: Progressive [102]  Admit to Progressive based on following criteria: CARDIOVASCULAR & THORACIC of moderate stability with acute coronary syndrome symptoms/low risk myocardial infarction/hypertensive urgency/arrhythmias/heart failure potentially compromising stability and stable post cardiovascular intervention patients.  May place patient in observation at [facility] or [facility] if equivalent level of care is available:: Yes  Covid Evaluation: Asymptomatic - no recent exposure (last 10 days) testing not required  Diagnosis: CAP (community acquired pneumonia) [629528]  Admitting Physician: Maryln Gottron [[id number]]  Attending Physician: Kirby Crigler, MIR [email or web address] [[id number]]          Medical History Past Medical History:  Diagnosis Date   COPD (chronic obstructive pulmonary disease) (HCC)    Essential hypertension    GERD (gastroesophageal reflux disease)    Pneumonia 05/2017   Respiratory failure with hypoxia (HCC) 05/2017    Allergies Allergies  Allergen Reactions    Codeine Nausea And Vomiting    IV Location/Drains/Wounds Patient Lines/Drains/Airways Status     Active Line/Drains/Airways     Name Placement date Placement time Site Days   Peripheral IV 05/11/23 20 G 1" Left Antecubital 05/11/23  0902  Antecubital  less than 1            Labs/Imaging Results for orders placed or performed during the hospital encounter of 05/11/23 (from the past 48 hour(s))  Basic metabolic panel     Status: Abnormal   Collection Time: 05/11/23  9:00 AM  Result Value Ref Range   Sodium 137 135 - 145 mmol/L   Potassium 3.4 (L) 3.5 - 5.1 mmol/L   Chloride 103 98 - 111 mmol/L   CO2 24 22 - 32 mmol/L   Glucose, Bld 128 (H) 70 - 99 mg/dL    Comment: Glucose reference range applies only to samples taken after fasting for at least 8 hours.   BUN 17 8 - 23 mg/dL   Creatinine, Ser 6.64 (H) 0.44 - 1.00 mg/dL   Calcium 8.7 (L) 8.9 - 10.3 mg/dL   GFR, Estimated >40 >34 mL/min    Comment: (NOTE) Calculated using the CKD-EPI Creatinine Equation (2021)    Anion gap 10 5 - 15    Comment: Performed at [facility], [address], [place], [place] [address]  CBC     Status: Abnormal   Collection Time: 05/11/23  9:00 AM  Result Value Ref  Range   WBC 14.1 (H) 4.0 - 10.5 K/uL   RBC 4.16 3.87 - 5.11 MIL/uL   Hemoglobin 10.9 (L) 12.0 - 15.0 g/dL   HCT 16.1 (L) 09.6 - 04.5 %   MCV 85.3 80.0 - 100.0 fL   MCH 26.2 26.0 - 34.0 pg   MCHC 30.7 30.0 - 36.0 g/dL   RDW 40.9 (H) 81.1 - 91.4 %   Platelets 409 (H) 150 - 400 K/uL   nRBC 0.0 0.0 - 0.2 %    Comment: Performed at [facility], [address] [place], [place] [address]   DG Chest 2 View  Result Date: 05/11/2023 CLINICAL DATA:  Shortness of breath.  Pneumonia EXAM: CHEST - 2 VIEW COMPARISON:  X-ray 05/18/2022.  CT 12/15/2022 FINDINGS: Normal cardiopericardial silhouette with calcified aorta. No pneumothorax. Hyperinflation identified. Areas of left midlung scar or atelectasis  identified. There is also some bandlike changes in the right lung base but a more confluence opacity as well in the right lung base. A subtle infiltrates possible in the middle lobe. No pleural effusion. Underlying chronic interstitial lung changes. Overlapping cardiac leads. IMPRESSION: Hyperinflation with chronic changes. There is more focal opacity over the right lung base. Acute infiltrate is possible recommend follow-up. Left midlung areas of scarring and atelectatic change Electronically Signed   By: Karen Kays M.D.   On: 05/11/2023 09:52    Pending Labs Unresulted Labs (From admission, onward)     Start     Ordered   05/12/23 0500  Comprehensive metabolic panel  Tomorrow morning,   R        05/11/23 1043   05/12/23 0500  CBC  Tomorrow morning,   R        05/11/23 1043   05/11/23 1026  Blood culture (routine x 2)  BLOOD CULTURE X 2,   R (with STAT occurrences)      05/11/23 1026   05/11/23 1026  Expectorated Sputum Assessment w Gram Stain, Rflx to Resp Cult  Once,   R        05/11/23 1026            Vitals/Pain Today's Vitals   05/11/23 0900 05/11/23 0901 05/11/23 0902 05/11/23 0915  BP: (!) 109/58   (!) 108/53  Pulse: 65   73  Resp: 15   10  Temp: 98.5 F (36.9 C)     TempSrc: Oral     SpO2: 96% (!) 87% 97% 100%  Weight:      Height:      PainSc:        Isolation Precautions No active isolations  Medications Medications  cefTRIAXone (ROCEPHIN) 1 g in sodium chloride 0.9 % 100 mL IVPB (has no administration in time range)  simvastatin (ZOCOR) tablet 40 mg (has no administration in time range)  pantoprazole (PROTONIX) EC tablet 40 mg (has no administration in time range)  fluticasone (FLONASE) 50 MCG/ACT nasal spray 1 spray (has no administration in time range)  montelukast (SINGULAIR) tablet 10 mg (has no administration in time range)  ipratropium-albuterol (DUONEB) 0.5-2.5 (3) MG/3ML nebulizer solution 3 mL (has no administration in time range)  enoxaparin  (LOVENOX) injection 40 mg (has no administration in time range)  acetaminophen (TYLENOL) tablet 650 mg (has no administration in time range)    Or  acetaminophen (TYLENOL) suppository 650 mg (has no administration in time range)  traZODone (DESYREL) tablet 25 mg (has no administration in time range)  ondansetron (ZOFRAN) tablet 4 mg (  has no administration in time range)    Or  ondansetron (ZOFRAN) injection 4 mg (has no administration in time range)  albuterol (PROVENTIL) (2.5 MG/3ML) 0.083% nebulizer solution 2.5 mg (has no administration in time range)  methylPREDNISolone sodium succinate (SOLU-MEDROL) 40 mg/mL injection 40 mg (has no administration in time range)  azithromycin (ZITHROMAX) 500 mg in sodium chloride 0.9 % 250 mL IVPB (has no administration in time range)  cefTRIAXone (ROCEPHIN) 1 g in sodium chloride 0.9 % 100 mL IVPB (has no administration in time range)  ipratropium-albuterol (DUONEB) 0.5-2.5 (3) MG/3ML nebulizer solution 3 mL (3 mLs Nebulization Given 05/11/23 0908)  methylPREDNISolone sodium succinate (SOLU-MEDROL) 125 mg/2 mL injection 125 mg (125 mg Intravenous Given 05/11/23 0914)    Mobility walks

## 2023-05-11 NOTE — H&P (Signed)
History and Physical  Breanna Ramirez WUX:[id number] DOB: 07-07-1956 DOA: 05/11/2023  PCP: Patient, No Pcp Per   Chief Complaint: Shortness of breath  HPI: Breanna Ramirez is a 67 y.o. female with medical history significant for COPD on nocturnal oxygen, GERD, hypertension being admitted to the hospital with COPD exacerbation.  Patient states that she has been suffering from shortness of breath with exertion for the last 2 to 3 weeks.  She has been prescribed multiple courses of antibiotics including Z-Pak, Levaquin, and most recently amoxicillin by her outpatient providers, without any improvement in her symptoms.  He has a little bit of an intermittent cough, but not much worse than her baseline.  Denies any chest pain, fevers, nausea, vomiting.  This past week, she was also on a course of oral prednisone 40 mg daily x 5 days.  None of this seemed to help very much at all.  She decided to come to the ER since she was just not feeling any better.  ED Course: She was noted to be saturating 88% on room air, placed on 2 L of oxygen.  She has been a little hypotensive, blood pressure as low as 108/53.  Otherwise vital signs were unremarkable.  Lab work shows white blood cell count 14, potassium 3.4, but otherwise unremarkable.  Chest x-ray was done and shows evidence of possible small right lower lobe consolidation.  Patient was given a dose of IV Rocephin, breathing treatments, IV steroids, and she is feeling much better now.  Hospitalist was contacted for admission.  Review of Systems: Please see HPI for pertinent positives and negatives. A complete 10 system review of systems are otherwise negative.  Past Medical History:  Diagnosis Date   COPD (chronic obstructive pulmonary disease) (HCC)    Essential hypertension    GERD (gastroesophageal reflux disease)    Pneumonia 05/2017   Respiratory failure with hypoxia (HCC) 05/2017   Past Surgical History:  Procedure Laterality Date   ENDOMETRIAL  ABLATION      Social History:  reports that she quit smoking about 5 years ago. Her smoking use included cigarettes. She has a 45.00 pack-year smoking history. She has never used smokeless tobacco. She reports that she does not drink alcohol and does not use drugs.   Allergies  Allergen Reactions   Codeine Nausea And Vomiting    Family History  Problem Relation Age of Onset   Cancer Father      Prior to Admission medications   Medication Sig Start Date End Date Taking? Authorizing Provider  albuterol (PROVENTIL HFA;VENTOLIN HFA) 108 (90 Base) MCG/ACT inhaler Inhale 1-2 puffs into the lungs every 6 (six) hours as needed for wheezing or shortness of breath.    [provider]  amoxicillin-clavulanate (AUGMENTIN) 875-125 MG tablet Take 1 tablet by mouth 2 (two) times daily. Patient not taking: Reported on 12/16/2022 11/28/22   Bevelyn Ngo, NP  esomeprazole (NEXIUM) 40 MG capsule Take 1 capsule (40 mg total) by mouth daily. 09/13/14   Jannifer Rodney A, FNP  ferrous sulfate 325 (65 FE) MG tablet Take 1 tablet (325 mg total) by mouth daily. 10/23/18 08/25/22  Marinda Elk, MD  fluticasone (FLONASE) 50 MCG/ACT nasal spray Place 1 spray into both nostrils 2 (two) times daily. 09/30/21   [provider]  Fluticasone-Umeclidin-Vilant (TRELEGY ELLIPTA) 200-62.5-25 MCG/ACT AEPB Inhale 1 puff into the lungs daily. 08/25/22   Martina Sinner, MD  hydrochlorothiazide (HYDRODIURIL) 25 MG tablet Take 1 tablet (25 mg total)  by mouth daily. 09/13/14   Jannifer Rodney A, FNP  ipratropium (ATROVENT) 0.03 % nasal spray Place 2 sprays into both nostrils every 12 (twelve) hours. 12/16/22   Bevelyn Ngo, NP  ipratropium-albuterol (DUONEB) 0.5-2.5 (3) MG/3ML SOLN SMARTSIG:1 Ampule(s) Via Nebulizer 3 Times Daily PRN Patient taking differently: Take 3 mLs by nebulization See admin instructions. SMARTSIG:1 Ampule(s) Via Nebulizer 3 Times Daily PRN 02/12/22   Martina Sinner, MD   montelukast (SINGULAIR) 10 MG tablet Take 1 tablet (10 mg total) by mouth at bedtime. 08/25/22   Martina Sinner, MD  predniSONE (DELTASONE) 10 MG tablet Prednisone taper; 10 mg tablets: 4 tabs x 2 days, 3 tabs x 2 days, 2 tabs x 2 days 1 tab x 2 days then stop. Patient not taking: Reported on 12/16/2022 11/28/22   Bevelyn Ngo, NP  simvastatin (ZOCOR) 40 MG tablet Take 1 tablet (40 mg total) by mouth at bedtime. Patient taking differently: Take 40 mg by mouth daily at 6 PM. 09/15/14   Junie Spencer, FNP    Physical Exam: BP (!) 108/53   Pulse 73   Temp 98.5 F (36.9 C) (Oral)   Resp 10   Ht 5\' 5"  (1.651 m)   Wt 61.2 kg   SpO2 100%   BMI 22.47 kg/m   General:  Alert, oriented, calm, in no acute distress, wearing 2 L nasal cannula oxygen, speaking in full sentences, no cough.  Looks very comfortable. Eyes: EOMI, clear conjuctivae, white sclerea Neck: supple, no masses, trachea mildline  Cardiovascular: RRR, no murmurs or rubs, no peripheral edema  Respiratory: clear to auscultation bilaterally with good bilateral air entry, she has some minimal end expiratory wheezes, no crackles, no tachypnea, retractions or other evidence of respiratory distress Abdomen: soft, nontender, nondistended, normal bowel tones heard  Skin: dry, no rashes  Musculoskeletal: no joint effusions, normal range of motion  Psychiatric: appropriate affect, normal speech  Neurologic: extraocular muscles intact, clear speech, moving all extremities with intact sensorium          Labs on Admission:  Basic Metabolic Panel: Recent Labs  Lab 05/11/23 0900  NA 137  K 3.4*  CL 103  CO2 24  GLUCOSE 128*  BUN 17  CREATININE 1.01*  CALCIUM 8.7*   Liver Function Tests: No results for input(s): "AST", "ALT", "ALKPHOS", "BILITOT", "PROT", "ALBUMIN" in the last 168 hours. No results for input(s): "LIPASE", "AMYLASE" in the last 168 hours. No results for input(s): "AMMONIA" in the last 168  hours. CBC: Recent Labs  Lab 05/11/23 0900  WBC 14.1*  HGB 10.9*  HCT 35.5*  MCV 85.3  PLT 409*   Cardiac Enzymes: No results for input(s): "CKTOTAL", "CKMB", "CKMBINDEX", "TROPONINI" in the last 168 hours.  BNP (last 3 results) Recent Labs    05/18/22 1010  BNP 80.4    ProBNP (last 3 results) No results for input(s): "PROBNP" in the last 8760 hours.  CBG: No results for input(s): "GLUCAP" in the last 168 hours.  Radiological Exams on Admission: DG Chest 2 View  Result Date: 05/11/2023 CLINICAL DATA:  Shortness of breath.  Pneumonia EXAM: CHEST - 2 VIEW COMPARISON:  X-ray 05/18/2022.  CT 12/15/2022 FINDINGS: Normal cardiopericardial silhouette with calcified aorta. No pneumothorax. Hyperinflation identified. Areas of left midlung scar or atelectasis identified. There is also some bandlike changes in the right lung base but a more confluence opacity as well in the right lung base. A subtle infiltrates possible in the middle lobe. No pleural  effusion. Underlying chronic interstitial lung changes. Overlapping cardiac leads. IMPRESSION: Hyperinflation with chronic changes. There is more focal opacity over the right lung base. Acute infiltrate is possible recommend follow-up. Left midlung areas of scarring and atelectatic change Electronically Signed   By: Karen Kays M.D.   On: 05/11/2023 09:52    Assessment/Plan This is a pleasant 67 year old female with a history of COPD on as needed oxygen, hypertension, GERD being admitted to the hospital with acute hypoxic respiratory failure likely due to COPD exacerbation and complicated by suspected community-acquired pneumonia.  I think her shortness of breath is most likely due to a COPD exacerbation, given lack of improvement as an outpatient with antibiotics, and the fact that she is doing much better now after receiving DuoNebs and IV steroids.  Given consolidation over the right lung base, will still empirically treat with IV antibiotics.   -Observation admission -Hold antihypertensives -Supplemental oxygen to keep O2 saturation above 90% -Scheduled DuoNebs, as needed albuterol -IV azithromycin and Rocephin -Solu-Medrol IV 40 mg twice daily  Leukocytosis-most likely due to recent steroid use, monitor with daily labs  Hypokalemia-borderline, mild, recheck in the morning  GERD-p.o. PPI    DVT prophylaxis: Lovenox     Code Status: Full Code  Consults called: None  Admission status: Observation  Time spent: 42 minutes  Breanna Kaufhold Sharlette Dense MD [facility] Pager [phone]  If 7PM-7AM, please contact night-coverage [email or web address] Password [facility]  05/11/2023, 10:46 AM

## 2023-05-11 NOTE — ED Provider Notes (Signed)
[place] EMERGENCY DEPARTMENT AT [facility] Provider Note   CSN: [id number] Arrival date & time: 05/11/23  0844     History  Chief Complaint  Patient presents with   Shortness of Breath    Breanna Ramirez is a 67 y.o. female with past medical history significant for COPD, previous tobacco abuse but without current tobacco use who presents with concern for shortness of breath worsening over the last 2 weeks.  Patient reports that she was seen and evaluated at her primary care doctor, diagnosed with pneumonia, initially placed on Z-Pak, she completed her Z-Pak, and then was placed on 5 days of Levaquin.  Patient reports that she has had no significant improvement despite Levaquin, azithromycin, remains having cough, difficulty breathing.  She reports some mid back pain.  She denies any chest pain.  Patient denies any fever, chills, denies significant sputum production.   Shortness of Breath      Home Medications Prior to Admission medications   Medication Sig Start Date End Date Taking? Authorizing Provider  albuterol (PROVENTIL HFA;VENTOLIN HFA) 108 (90 Base) MCG/ACT inhaler Inhale 1-2 puffs into the lungs every 6 (six) hours as needed for wheezing or shortness of breath.    [provider]  amoxicillin-clavulanate (AUGMENTIN) 875-125 MG tablet Take 1 tablet by mouth 2 (two) times daily. Patient not taking: Reported on 12/16/2022 11/28/22   Bevelyn Ngo, NP  esomeprazole (NEXIUM) 40 MG capsule Take 1 capsule (40 mg total) by mouth daily. 09/13/14   Jannifer Rodney A, FNP  ferrous sulfate 325 (65 FE) MG tablet Take 1 tablet (325 mg total) by mouth daily. 10/23/18 08/25/22  Marinda Elk, MD  fluticasone (FLONASE) 50 MCG/ACT nasal spray Place 1 spray into both nostrils 2 (two) times daily. 09/30/21   [provider]  Fluticasone-Umeclidin-Vilant (TRELEGY ELLIPTA) 200-62.5-25 MCG/ACT AEPB Inhale 1 puff into the lungs daily. 08/25/22   Martina Sinner,  MD  hydrochlorothiazide (HYDRODIURIL) 25 MG tablet Take 1 tablet (25 mg total) by mouth daily. 09/13/14   Jannifer Rodney A, FNP  ipratropium (ATROVENT) 0.03 % nasal spray Place 2 sprays into both nostrils every 12 (twelve) hours. 12/16/22   Bevelyn Ngo, NP  ipratropium-albuterol (DUONEB) 0.5-2.5 (3) MG/3ML SOLN SMARTSIG:1 Ampule(s) Via Nebulizer 3 Times Daily PRN Patient taking differently: Take 3 mLs by nebulization See admin instructions. SMARTSIG:1 Ampule(s) Via Nebulizer 3 Times Daily PRN 02/12/22   Martina Sinner, MD  montelukast (SINGULAIR) 10 MG tablet Take 1 tablet (10 mg total) by mouth at bedtime. 08/25/22   Martina Sinner, MD  predniSONE (DELTASONE) 10 MG tablet Prednisone taper; 10 mg tablets: 4 tabs x 2 days, 3 tabs x 2 days, 2 tabs x 2 days 1 tab x 2 days then stop. Patient not taking: Reported on 12/16/2022 11/28/22   Bevelyn Ngo, NP  simvastatin (ZOCOR) 40 MG tablet Take 1 tablet (40 mg total) by mouth at bedtime. Patient taking differently: Take 40 mg by mouth daily at 6 PM. 09/15/14   Junie Spencer, FNP      Allergies    Codeine    Review of Systems   Review of Systems  Respiratory:  Positive for shortness of breath.   All other systems reviewed and are negative.   Physical Exam Updated Vital Signs BP (!) 108/53   Pulse 73   Temp 98.5 F (36.9 C) (Oral)   Resp 10   Ht 5\' 5"  (1.651 m)   Wt 61.2 kg  SpO2 100%   BMI 22.47 kg/m  Physical Exam Vitals and nursing note reviewed.  Constitutional:      General: She is not in acute distress.    Appearance: Normal appearance.  HENT:     Head: Normocephalic and atraumatic.  Eyes:     General:        Right eye: No discharge.        Left eye: No discharge.  Cardiovascular:     Rate and Rhythm: Normal rate and regular rhythm.     Heart sounds: No murmur heard.    No friction rub. No gallop.  Pulmonary:     Effort: Pulmonary effort is normal.     Breath sounds: Normal breath sounds.     Comments:  Patient with some inspiratory, expiratory wheezing, crackles, rhonchi, no stridor, no tachypnea, respiratory distress Abdominal:     General: Bowel sounds are normal.     Palpations: Abdomen is soft.  Skin:    General: Skin is warm and dry.     Capillary Refill: Capillary refill takes less than 2 seconds.  Neurological:     Mental Status: She is alert and oriented to person, place, and time.  Psychiatric:        Mood and Affect: Mood normal.        Behavior: Behavior normal.     ED Results / Procedures / Treatments   Labs (all labs ordered are listed, but only abnormal results are displayed) Labs Reviewed  BASIC METABOLIC PANEL - Abnormal; Notable for the following components:      Result Value   Potassium 3.4 (*)    Glucose, Bld 128 (*)    Creatinine, Ser 1.01 (*)    Calcium 8.7 (*)    All other components within normal limits  CBC - Abnormal; Notable for the following components:   WBC 14.1 (*)    Hemoglobin 10.9 (*)    HCT 35.5 (*)    RDW 16.1 (*)    Platelets 409 (*)    All other components within normal limits  CULTURE, BLOOD (ROUTINE X 2)  CULTURE, BLOOD (ROUTINE X 2)  EXPECTORATED SPUTUM ASSESSMENT W GRAM STAIN, RFLX TO RESP C    EKG None  Radiology DG Chest 2 View  Result Date: 05/11/2023 CLINICAL DATA:  Shortness of breath.  Pneumonia EXAM: CHEST - 2 VIEW COMPARISON:  X-ray 05/18/2022.  CT 12/15/2022 FINDINGS: Normal cardiopericardial silhouette with calcified aorta. No pneumothorax. Hyperinflation identified. Areas of left midlung scar or atelectasis identified. There is also some bandlike changes in the right lung base but a more confluence opacity as well in the right lung base. A subtle infiltrates possible in the middle lobe. No pleural effusion. Underlying chronic interstitial lung changes. Overlapping cardiac leads. IMPRESSION: Hyperinflation with chronic changes. There is more focal opacity over the right lung base. Acute infiltrate is possible recommend  follow-up. Left midlung areas of scarring and atelectatic change Electronically Signed   By: Karen Kays M.D.   On: 05/11/2023 09:52    Procedures Procedures    Medications Ordered in ED Medications  cefTRIAXone (ROCEPHIN) 1 g in sodium chloride 0.9 % 100 mL IVPB (1 g Intravenous New Bag/Given 05/11/23 1055)  simvastatin (ZOCOR) tablet 40 mg (has no administration in time range)  pantoprazole (PROTONIX) EC tablet 40 mg (has no administration in time range)  fluticasone (FLONASE) 50 MCG/ACT nasal spray 1 spray (has no administration in time range)  montelukast (SINGULAIR) tablet 10 mg (has no administration in  time range)  ipratropium-albuterol (DUONEB) 0.5-2.5 (3) MG/3ML nebulizer solution 3 mL (has no administration in time range)  enoxaparin (LOVENOX) injection 40 mg (has no administration in time range)  acetaminophen (TYLENOL) tablet 650 mg (has no administration in time range)    Or  acetaminophen (TYLENOL) suppository 650 mg (has no administration in time range)  traZODone (DESYREL) tablet 25 mg (has no administration in time range)  ondansetron (ZOFRAN) tablet 4 mg (has no administration in time range)    Or  ondansetron (ZOFRAN) injection 4 mg (has no administration in time range)  albuterol (PROVENTIL) (2.5 MG/3ML) 0.083% nebulizer solution 2.5 mg (has no administration in time range)  methylPREDNISolone sodium succinate (SOLU-MEDROL) 40 mg/mL injection 40 mg (has no administration in time range)  azithromycin (ZITHROMAX) 500 mg in sodium chloride 0.9 % 250 mL IVPB (has no administration in time range)  cefTRIAXone (ROCEPHIN) 1 g in sodium chloride 0.9 % 100 mL IVPB (has no administration in time range)  ipratropium-albuterol (DUONEB) 0.5-2.5 (3) MG/3ML nebulizer solution 3 mL (3 mLs Nebulization Given 05/11/23 0908)  methylPREDNISolone sodium succinate (SOLU-MEDROL) 125 mg/2 mL injection 125 mg (125 mg Intravenous Given 05/11/23 0914)    ED Course/ Medical Decision Making/  A&P Clinical Course as of 05/11/23 1057  Mon May 11, 2023  0910 Stable  66 YOF with SOB S/P Levaquin 2LNC for hypoxia [CC]    Clinical Course User Index [CC] Glyn Ade, MD                             Medical Decision Making Amount and/or Complexity of Data Reviewed Labs: ordered. Radiology: ordered.  Risk Prescription drug management. Decision regarding hospitalization.   This patient is a 67 y.o. female who presents to the ED for concern of shob, this involves an extensive number of treatment options, and is a complaint that carries with it a high risk of complications and morbidity. The emergent differential diagnosis prior to evaluation includes, but is not limited to,  asthma exacerbation, COPD exacerbation, acute upper respiratory infection, acute bronchitis, chronic bronchitis, interstitial lung disease, ARDS, PE, pneumonia, atypical ACS, carbon monoxide poisoning, spontaneous pneumothorax, new CHF vs CHF exacerbation, versus other . This is not an exhaustive differential.   Past Medical History / Co-morbidities / Social History: COPD, hyperlipidemia, GERD.  Additional history: Chart reviewed. Pertinent results include: Reviewed outpatient pulmonology visits, lab work, imaging from previous hospital admission  Physical Exam: Physical exam performed. The pertinent findings include: Patient with some wheezing, rhonchi throughout, no stridor, no tachypnea, no acute respiratory distress.  She was hypoxic on arrival with oxygen saturation 87% on room air, improved with 2 L nasal cannula.  Lab Tests: I ordered, and personally interpreted labs.  The pertinent results include: CBC with leukocytosis, white blood cells 14.1 which is mild anemia, hemoglobin 10.9, platelets are also elevated suggesting possibly some mild degree of hemoconcentration.  Versus acute phase reactant.  Her BMP is overall unremarkable, mild hypokalemia, testing 3.4.  Treated initially with blood culture,  sputum cultures are pending at this time.   Imaging Studies: I ordered imaging studies including chest x ray. I independently visualized and interpreted imaging which showed COPD, she was in triage focal consolidation in lower right. I agree with the radiologist interpretation.   Cardiac Monitoring:  The patient was maintained on a cardiac monitor.  My attending physician Dr. Doran Durand viewed and interpreted the cardiac monitored which showed an underlying rhythm of: NSR.  I agree with this interpretation.   Medications: I ordered medication including Solu-Medrol, DuoNeb for COPD exacerbation, Rocephin for presumed community-acquired pneumonia, antibiosis may need to be changed based on blood cultures, sputum cultures if she failed outpatient azithromycin and Levaquin. Reevaluation of the patient after these medicines showed that the patient improved in respiratory drive, wheezing, still with scattered rhonchi.  Consultations Obtained: I requested consultation with the hospitalist, spoke with Dr. Kirby Crigler,  and discussed lab and imaging findings as well as pertinent plan - they recommend: Admission for acute hypoxic respiratory failure in context of pneumonia   Disposition: After consideration of the diagnostic results and the patients response to treatment, I feel that patient would benefit from admission.   I discussed this case with my attending physician Dr. Doran Durand who cosigned this note including patient's presenting symptoms, physical exam, and planned diagnostics and interventions. Attending physician stated agreement with plan or made changes to plan which were implemented.     Final Clinical Impression(s) / ED Diagnoses Final diagnoses:  None    Rx / DC Orders ED Discharge Orders     None         Olene Floss, PA-C 05/11/23 1057    Glyn Ade, MD 05/12/23 [phone]

## 2023-05-11 NOTE — ED Triage Notes (Addendum)
Pt reports shortness of breath x few weeks states was dx with pneumonia last month. Als oc/o mid upper back pain.Pt 88% on room air in triage

## 2023-05-11 NOTE — ED Notes (Addendum)
Assumed care of pt. Pt sitting up in bed in no obvious distress. Pt is CAOx4, complaining of SOB. [place] with 2 liters administered due to low sat. Pt is able to speak in complete sentences at this time.

## 2023-05-12 DIAGNOSIS — I959 Hypotension, unspecified: Secondary | ICD-10-CM | POA: Diagnosis present

## 2023-05-12 DIAGNOSIS — J9601 Acute respiratory failure with hypoxia: Secondary | ICD-10-CM | POA: Diagnosis present

## 2023-05-12 DIAGNOSIS — E785 Hyperlipidemia, unspecified: Secondary | ICD-10-CM | POA: Diagnosis present

## 2023-05-12 DIAGNOSIS — Z79899 Other long term (current) drug therapy: Secondary | ICD-10-CM | POA: Diagnosis not present

## 2023-05-12 DIAGNOSIS — T380X5A Adverse effect of glucocorticoids and synthetic analogues, initial encounter: Secondary | ICD-10-CM | POA: Diagnosis present

## 2023-05-12 DIAGNOSIS — I10 Essential (primary) hypertension: Secondary | ICD-10-CM | POA: Diagnosis present

## 2023-05-12 DIAGNOSIS — Z7951 Long term (current) use of inhaled steroids: Secondary | ICD-10-CM | POA: Diagnosis not present

## 2023-05-12 DIAGNOSIS — J440 Chronic obstructive pulmonary disease with acute lower respiratory infection: Secondary | ICD-10-CM | POA: Diagnosis present

## 2023-05-12 DIAGNOSIS — Z87891 Personal history of nicotine dependence: Secondary | ICD-10-CM | POA: Diagnosis not present

## 2023-05-12 DIAGNOSIS — K219 Gastro-esophageal reflux disease without esophagitis: Secondary | ICD-10-CM | POA: Diagnosis present

## 2023-05-12 DIAGNOSIS — J189 Pneumonia, unspecified organism: Secondary | ICD-10-CM | POA: Diagnosis not present

## 2023-05-12 DIAGNOSIS — J441 Chronic obstructive pulmonary disease with (acute) exacerbation: Secondary | ICD-10-CM | POA: Diagnosis present

## 2023-05-12 DIAGNOSIS — E876 Hypokalemia: Secondary | ICD-10-CM | POA: Diagnosis present

## 2023-05-12 DIAGNOSIS — Z885 Allergy status to narcotic agent status: Secondary | ICD-10-CM | POA: Diagnosis not present

## 2023-05-12 LAB — COMPREHENSIVE METABOLIC PANEL
ALT: 10 U/L (ref 0–44)
AST: 11 U/L — ABNORMAL LOW (ref 15–41)
Albumin: 3 g/dL — ABNORMAL LOW (ref 3.5–5.0)
Alkaline Phosphatase: 46 U/L (ref 38–126)
Anion gap: 7 (ref 5–15)
BUN: 18 mg/dL (ref 8–23)
CO2: 27 mmol/L (ref 22–32)
Calcium: 9 mg/dL (ref 8.9–10.3)
Chloride: 106 mmol/L (ref 98–111)
Creatinine, Ser: 0.81 mg/dL (ref 0.44–1.00)
GFR, Estimated: >60 mL/min (ref >60)
Glucose, Bld: 139 mg/dL — ABNORMAL HIGH (ref 70–99)
Potassium: 4.2 mmol/L (ref 3.5–5.1)
Sodium: 140 mmol/L (ref 135–145)
Total Bilirubin: 0.3 mg/dL (ref 0.3–1.2)
Total Protein: 6 g/dL — ABNORMAL LOW (ref 6.5–8.1)

## 2023-05-12 LAB — CBC
HCT: 30.9 % — ABNORMAL LOW (ref 36.0–46.0)
HCT: 33.3 % — ABNORMAL LOW (ref 36.0–46.0)
Hemoglobin: 9.3 g/dL — ABNORMAL LOW (ref 12.0–15.0)
Hemoglobin: 9.9 g/dL — ABNORMAL LOW (ref 12.0–15.0)
MCH: 25.6 pg — ABNORMAL LOW (ref 26.0–34.0)
MCH: 25.8 pg — ABNORMAL LOW (ref 26.0–34.0)
MCHC: 30.1 g/dL (ref 30.0–36.0)
MCHC: 29.7 g/dL — ABNORMAL LOW (ref 30.0–36.0)
MCV: 85.1 fL (ref 80.0–100.0)
MCV: 86.9 fL (ref 80.0–100.0)
Platelets: 318 10*3/uL (ref 150–400)
Platelets: 355 10*3/uL (ref 150–400)
RBC: 3.63 MIL/uL — ABNORMAL LOW (ref 3.87–5.11)
RBC: 3.83 MIL/uL — ABNORMAL LOW (ref 3.87–5.11)
RDW: 15.9 % — ABNORMAL HIGH (ref 11.5–15.5)
RDW: 16.1 % — ABNORMAL HIGH (ref 11.5–15.5)
WBC: 8.6 10*3/uL (ref 4.0–10.5)
WBC: 12 10*3/uL — ABNORMAL HIGH (ref 4.0–10.5)
nRBC: 0 % (ref 0.0–0.2)
nRBC: 0 % (ref 0.0–0.2)

## 2023-05-12 LAB — BRAIN NATRIURETIC PEPTIDE: B Natriuretic Peptide: 184.9 pg/mL — ABNORMAL HIGH (ref 0.0–100.0)

## 2023-05-12 LAB — MAGNESIUM: Magnesium: 1.9 mg/dL (ref 1.7–2.4)

## 2023-05-12 MED ORDER — SENNOSIDES-DOCUSATE SODIUM 8.6-50 MG PO TABS: 1.0000 | ORAL_TABLET | Freq: Every evening | ORAL | Status: DC | PRN

## 2023-05-12 MED ORDER — BUDESONIDE 0.5 MG/2ML IN SUSP
0.5000 mg | Freq: Two times a day (BID) | RESPIRATORY_TRACT | Status: DC
  Administered 2023-05-12 – 2023-05-14 (×4): 0.5 mg via RESPIRATORY_TRACT
  Filled 2023-05-12 (×4): qty 2

## 2023-05-12 MED ORDER — METOPROLOL TARTRATE 5 MG/5ML IV SOLN: 5.0000 mg | INTRAVENOUS | Status: DC | PRN

## 2023-05-12 MED ORDER — BUDESONIDE 0.5 MG/2ML IN SUSP: 0.5000 mg | Freq: Two times a day (BID) | RESPIRATORY_TRACT | Status: DC

## 2023-05-12 MED ORDER — GUAIFENESIN 100 MG/5ML PO LIQD
5.0000 mL | ORAL | Status: DC | PRN
  Administered 2023-05-13: 5 mL via ORAL
  Filled 2023-05-12: qty 10

## 2023-05-12 MED ORDER — IPRATROPIUM-ALBUTEROL 0.5-2.5 (3) MG/3ML IN SOLN: 3.0000 mL | RESPIRATORY_TRACT | Status: DC | PRN

## 2023-05-12 MED ORDER — HYDRALAZINE HCL 20 MG/ML IJ SOLN: 10.0000 mg | INTRAMUSCULAR | Status: DC | PRN

## 2023-05-12 MED ORDER — IPRATROPIUM-ALBUTEROL 0.5-2.5 (3) MG/3ML IN SOLN
3.0000 mL | Freq: Two times a day (BID) | RESPIRATORY_TRACT | Status: DC
  Administered 2023-05-12 – 2023-05-13 (×2): 3 mL via RESPIRATORY_TRACT
  Filled 2023-05-12 (×2): qty 3

## 2023-05-12 NOTE — Progress Notes (Signed)
Mobility Specialist - Progress Note   05/12/23 1055  Oxygen Therapy  O2 Device Nasal Cannula  O2 Flow Rate (L/min) 2 L/min  Patient Activity (if Appropriate) Ambulating  Mobility  Activity Ambulated independently in hallway  Level of Assistance Independent  Assistive Device None  Distance Ambulated (ft) 350 ft  Activity Response Tolerated well  Mobility Referral Yes  $Mobility charge 1 Mobility  Mobility Specialist Start Time (ACUTE ONLY) 1040  Mobility Specialist Stop Time (ACUTE ONLY) 1054  Mobility Specialist Time Calculation (min) (ACUTE ONLY) 14 min   Pt received in bed and agreeable to mobility. Upon arrival pt has [place] off with O2 at 81%. Put [place] back on allowing O2 to come back up to 89%. During ambulation pt desat to 85% on 2L [place]. Encouraged a standing rest break allowing O2 to come back up to 89%. No complaints during session. Pt to bed after session with all needs met.   Pre-mobility: 99 HR, 81-89% SpO2 (2L [place]) During mobility: 96 HR, 85%-89%  SpO2 (2L [place]) Post-mobility: 87 HR, 88% SPO2 (2L )  Chief Technology Officer

## 2023-05-12 NOTE — Progress Notes (Signed)
Mobility Specialist - Progress Note   05/12/23 1413  Mobility  Activity Ambulated independently in hallway  Level of Assistance Independent  Assistive Device None  Distance Ambulated (ft) 350 ft  Activity Response Tolerated well  Mobility Referral Yes  $Mobility charge 1 Mobility  Mobility Specialist Start Time (ACUTE ONLY) 0200  Mobility Specialist Stop Time (ACUTE ONLY) 0211  Mobility Specialist Time Calculation (min) (ACUTE ONLY) 11 min   Pt received in bed and agreeable to mobility. No complaints during session. Pt to bed after session with all needs met.    [facility]

## 2023-05-12 NOTE — Progress Notes (Signed)
PROGRESS NOTE    Breanna Ramirez  ZOX:[id number] DOB: 07/18/1956 DOA: 05/11/2023 PCP: Patient, No Pcp Per   Brief Narrative:   67 year old with history of COPD on nocturnal oxygen, GERD, HTN admitted for COPD exacerbation.  Patient has had ongoing shortness of breath for about 2 weeks and has been on multiple courses of outpatient antibiotics and recently completed prednisone course as well.  Upon admission patient was noted to be hypoxic with abnormal breath sounds.  Assessment & Plan:  Principal Problem:   CAP (community acquired pneumonia) Active Problems:   G E R D   Acute respiratory failure with hypoxia (HCC)   COPD with acute exacerbation (HCC)   Hyperlipidemia    Acute COPD exacerbation Community-acquired pneumonia, right lower lobe - At this time patient is on empiric IV Rocephin and azithromycin.  Bronchodilators, scheduled and as needed.  Will add Pulmicort.  Solu-Medrol.  I-S/flutter valve.  Will check respiratory panel as well.  Hypokalemia - As needed repletion  GERD - PPI  Hyperlipidemia -Zocor  Essential hypertension -On home HCTZ which is on hold.  IV as needed.    DVT prophylaxis: Lovenox Code Status: Full code Family Communication:   still has significant abnormal breath sounds.  Continue hospital stay       Diet Orders (From admission, onward)     Start     Ordered   05/11/23 1043  Diet regular Room service appropriate? Yes; Fluid consistency: Thin  Diet effective now       Question Answer Comment  Room service appropriate? Yes   Fluid consistency: Thin      05/11/23 1043            Subjective:  Still has exertional dyspnea but at rest feels better compared to yesterday Uses 2.5 L nasal cannula at night Examination:  General exam: Appears calm and comfortable  Respiratory system: Diffuse bilateral rhonchi Cardiovascular system: S1 & S2 heard, RRR. No JVD, murmurs, rubs, gallops or clicks. No pedal edema. Gastrointestinal  system: Abdomen is nondistended, soft and nontender. No organomegaly or masses felt. Normal bowel sounds heard. Central nervous system: Alert and oriented. No focal neurological deficits. Extremities: Symmetric 5 x 5 power. Skin: No rashes, lesions or ulcers Psychiatry: Judgement and insight appear normal. Mood & affect appropriate.  Objective: Vitals:   05/11/23 2010 05/12/23 0007 05/12/23 0540 05/12/23 0752  BP: (!) 102/52 (!) 103/58 (!) 111/51   Pulse: 69 68 64   Resp: 20 18 18    Temp: 98.7 F (37.1 C) 98.5 F (36.9 C) 98.2 F (36.8 C)   TempSrc: Oral Oral Oral   SpO2: 94% 92% 93% 94%  Weight:      Height:        Intake/Output Summary (Last 24 hours) at 05/12/2023 0857 Last data filed at 05/11/2023 1726 Gross per 24 hour  Intake 267.08 ml  Output --  Net 267.08 ml   Filed Weights   05/11/23 0851  Weight: 61.2 kg    Scheduled Meds:  enoxaparin (LOVENOX) injection  40 mg Subcutaneous Q24H   fluticasone  1 spray Each Nare BID   ipratropium-albuterol  3 mL Nebulization BID   methylPREDNISolone (SOLU-MEDROL) injection  40 mg Intravenous Q12H   montelukast  10 mg Oral QHS   pantoprazole  40 mg Oral Daily   simvastatin  40 mg Oral q1800   Continuous Infusions:  azithromycin 250 mL/hr at 05/11/23 1726   cefTRIAXone (ROCEPHIN)  IV      Nutritional status  Body mass index is 22.47 kg/m.  Data Reviewed:   CBC: Recent Labs  Lab 05/11/23 0900 05/12/23 0441  WBC 14.1* 8.6  HGB 10.9* 9.3*  HCT 35.5* 30.9*  MCV 85.3 85.1  PLT 409* 318   Basic Metabolic Panel: Recent Labs  Lab 05/11/23 0900 05/12/23 0441  NA 137 140  K 3.4* 4.2  CL 103 106  CO2 24 27  GLUCOSE 128* 139*  BUN 17 18  CREATININE 1.01* 0.81  CALCIUM 8.7* 9.0   GFR: Estimated Creatinine Clearance: 60.6 mL/min (by C-G formula based on SCr of 0.81 mg/dL). Liver Function Tests: Recent Labs  Lab 05/12/23 0441  AST 11*  ALT 10  ALKPHOS 46  BILITOT 0.3  PROT 6.0*  ALBUMIN 3.0*   No  results for input(s): "LIPASE", "AMYLASE" in the last 168 hours. No results for input(s): "AMMONIA" in the last 168 hours. Coagulation Profile: No results for input(s): "INR", "PROTIME" in the last 168 hours. Cardiac Enzymes: No results for input(s): "CKTOTAL", "CKMB", "CKMBINDEX", "TROPONINI" in the last 168 hours. BNP (last 3 results) No results for input(s): "PROBNP" in the last 8760 hours. HbA1C: No results for input(s): "HGBA1C" in the last 72 hours. CBG: No results for input(s): "GLUCAP" in the last 168 hours. Lipid Profile: No results for input(s): "CHOL", "HDL", "LDLCALC", "TRIG", "CHOLHDL", "LDLDIRECT" in the last 72 hours. Thyroid Function Tests: No results for input(s): "TSH", "T4TOTAL", "FREET4", "T3FREE", "THYROIDAB" in the last 72 hours. Anemia Panel: No results for input(s): "VITAMINB12", "FOLATE", "FERRITIN", "TIBC", "IRON", "RETICCTPCT" in the last 72 hours. Sepsis Labs: No results for input(s): "PROCALCITON", "LATICACIDVEN" in the last 168 hours.  Recent Results (from the past 240 hour(s))  Blood culture (routine x 2)     Status: None (Preliminary result)   Collection Time: 05/11/23 10:40 AM   Specimen: Right Antecubital; Blood  Result Value Ref Range Status   Specimen Description   Final    RIGHT ANTECUBITAL Performed at [facility], [address] [place], [place] [address]    Special Requests   Final    BOTTLES DRAWN AEROBIC AND ANAEROBIC Blood Culture results may not be optimal due to an excessive volume of blood received in culture bottles Performed at [facility], [address], [place], [place] [address]    Culture   Final    NO GROWTH < 24 HOURS Performed at [facility], [address], [place], [place] [address]    Report Status PENDING  Incomplete  Blood culture (routine x 2)     Status: None (Preliminary result)   Collection Time: 05/11/23 10:40 AM   Specimen: Left Antecubital; Blood  Result Value  Ref Range Status   Specimen Description   Final    LEFT ANTECUBITAL Performed at [facility], [address] [place], [place] [address]    Special Requests   Final    BOTTLES DRAWN AEROBIC AND ANAEROBIC Blood Culture adequate volume Performed at [facility], [address], [place], [place] [address]    Culture   Final    NO GROWTH < 24 HOURS Performed at [facility], [address], [place], [place] [address]    Report Status PENDING  Incomplete         Radiology Studies: DG Chest 2 View  Result Date: 05/11/2023 CLINICAL DATA:  Shortness of breath.  Pneumonia EXAM: CHEST - 2 VIEW COMPARISON:  X-ray 05/18/2022.  CT 12/15/2022 FINDINGS: Normal cardiopericardial silhouette  with calcified aorta. No pneumothorax. Hyperinflation identified. Areas of left midlung scar or atelectasis identified. There is also some bandlike changes in the right lung base but a more confluence opacity as well in the right lung base. A subtle infiltrates possible in the middle lobe. No pleural effusion. Underlying chronic interstitial lung changes. Overlapping cardiac leads. IMPRESSION: Hyperinflation with chronic changes. There is more focal opacity over the right lung base. Acute infiltrate is possible recommend follow-up. Left midlung areas of scarring and atelectatic change Electronically Signed   By: Karen Kays M.D.   On: 05/11/2023 09:52           LOS: 0 days   Time spent= 35 mins    Tayah Idrovo Joline Maxcy, MD [facility]  If 7PM-7AM, please contact night-coverage  05/12/2023, 8:57 AM

## 2023-05-13 ENCOUNTER — Encounter (HOSPITAL_COMMUNITY): Payer: Self-pay | Admitting: Internal Medicine

## 2023-05-13 DIAGNOSIS — J441 Chronic obstructive pulmonary disease with (acute) exacerbation: Secondary | ICD-10-CM | POA: Diagnosis not present

## 2023-05-13 DIAGNOSIS — J189 Pneumonia, unspecified organism: Principal | ICD-10-CM

## 2023-05-13 LAB — BASIC METABOLIC PANEL
Anion gap: 9 (ref 5–15)
BUN: 26 mg/dL — ABNORMAL HIGH (ref 8–23)
CO2: 26 mmol/L (ref 22–32)
Calcium: 9.3 mg/dL (ref 8.9–10.3)
Chloride: 104 mmol/L (ref 98–111)
Creatinine, Ser: 0.86 mg/dL (ref 0.44–1.00)
GFR, Estimated: >60 mL/min (ref >60)
Glucose, Bld: 133 mg/dL — ABNORMAL HIGH (ref 70–99)
Potassium: 4.3 mmol/L (ref 3.5–5.1)
Sodium: 139 mmol/L (ref 135–145)

## 2023-05-13 LAB — RESPIRATORY PANEL BY PCR

## 2023-05-13 LAB — PROCALCITONIN: Procalcitonin: <0.1 ng/mL

## 2023-05-13 LAB — CULTURE, BLOOD (ROUTINE X 2): Culture: NO GROWTH

## 2023-05-13 MED ORDER — IPRATROPIUM-ALBUTEROL 0.5-2.5 (3) MG/3ML IN SOLN
3.0000 mL | Freq: Four times a day (QID) | RESPIRATORY_TRACT | Status: DC
  Administered 2023-05-13: 3 mL via RESPIRATORY_TRACT
  Filled 2023-05-13: qty 3

## 2023-05-13 MED ORDER — IPRATROPIUM-ALBUTEROL 0.5-2.5 (3) MG/3ML IN SOLN
3.0000 mL | Freq: Three times a day (TID) | RESPIRATORY_TRACT | Status: DC
  Administered 2023-05-13 – 2023-05-14 (×2): 3 mL via RESPIRATORY_TRACT
  Filled 2023-05-13 (×2): qty 3

## 2023-05-13 MED ORDER — PREDNISONE 50 MG PO TABS
50.0000 mg | ORAL_TABLET | Freq: Every day | ORAL | Status: DC
  Administered 2023-05-14: 50 mg via ORAL
  Filled 2023-05-13: qty 1

## 2023-05-13 NOTE — Progress Notes (Signed)
.   Transition of Care [facility]) Screening Note   Patient Details  Name: Breanna Ramirez Date of Birth: 01-Nov-1956   Transition of Care [facility]) CM/SW Contact:    Larrie Kass, LCSW Phone Number: 05/13/2023, 10:59 AM    Transition of Care Department [facility]) has reviewed patient and no TOC needs have been identified at this time. We will continue to monitor patient advancement through interdisciplinary progression rounds. If new patient transition needs arise, please place a TOC consult.

## 2023-05-13 NOTE — Progress Notes (Signed)
Covering for primary RN, while at lunch. Pt c/o LAC IV hurting while azithromycin infusing. Stopped pump. There is a small swollen & tender area, but IV flushes without pain & obtained a + blood return. Re-started rate of ABX @ 125 ml/hr. & applied heat pack to site. Instructed pt to call again, if med is still irritating vein. Will inform RN upon return to unit.

## 2023-05-13 NOTE — Progress Notes (Signed)
[facility] PROGRESS NOTE    Progress Note  Breanna Ramirez  ZOX:[id number] DOB: [birth year] DOA: 05/11/2023 PCP: Patient, No Pcp Per     Brief Narrative:   Breanna Ramirez is an 67 y.o. female past medical history of COPD on nocturnal oxygen, hypertension came in with 2 weeks of shortness of breath found to be hypoxic with community-acquired pneumonia  Assessment/Plan:   Acute respiratory failure with hypoxia likely due to  CAP (community acquired pneumonia) right lower lobe:  empirically on Rocephin and azithromycin. Also started on Pulmicort and albuterol. As he had a component of COPD exacerbation he was started on IV Solu-Medrol and as needed inhalers. Still crying 3 L of oxygen to keep saturations greater 96%. Respiratory virus panel is pending.  Hypokalemia: Replete orally now resolved.  GERD: Continue PPI.  Hyperlipidemia Continue Zocor  DVT prophylaxis: lovenox Family Communication:none Status is: Inpatient Remains inpatient appropriate because: Acute respiratory failure with hypoxia    Code Status:     Code Status Orders  (From admission, onward)           Start     Ordered   05/11/23 1043  Full code  Continuous       Question:  By:  Answer:  Consent: discussion documented in EHR   05/11/23 1043           Code Status History     Date Active Date Inactive Code Status Order ID Comments User Context   05/18/2022 1234 05/21/2022 2034 Full Code [id number]  Bobette Mo, MD ED   10/21/2018 1954 10/23/2018 2037 Full Code [id number]  Marinda Elk, MD ED   06/18/2017 1517 06/22/2017 2146 Full Code [id number]  Darreld Mclean, MD ED         IV Access:   Peripheral IV   Procedures and diagnostic studies:   No results found.   Medical Consultants:   None.   Subjective:    Breanna Ramirez relates her breathing is unchanged compared to yesterday.  Objective:    Vitals:   05/12/23 2039 05/12/23 2044 05/13/23 0510  05/13/23 0820  BP: 104/64  118/72   Pulse: 76  81   Resp: 15  15   Temp: 98.5 F (36.9 C)  98.2 F (36.8 C)   TempSrc: Oral  Oral   SpO2: 95% (!) 89% 92% 96%  Weight:      Height:       SpO2: 96 % O2 Flow Rate (L/min): 3 L/min FiO2 (%): 32 %   Intake/Output Summary (Last 24 hours) at 05/13/2023 1042 Last data filed at 05/13/2023 0900 Gross per 24 hour  Intake 1219.37 ml  Output --  Net 1219.37 ml   Filed Weights   05/11/23 0851  Weight: 61.2 kg    Exam: General exam: In no acute distress. Respiratory system: Good air movement with wheezing bilaterally she has a persistent cough whe I was in the room Cardiovascular system: S1 & S2 heard, RRR. No JVD. Gastrointestinal system: Abdomen is nondistended, soft and nontender.  Extremities: No pedal edema. Skin: No rashes, lesions or ulcers Psychiatry: Judgement and insight appear normal. Mood & affect appropriate.    Data Reviewed:    Labs: Basic Metabolic Panel: Recent Labs  Lab 05/11/23 0900 05/12/23 0441 05/12/23 1002 05/13/23 0415  NA 137 140  --  139  K 3.4* 4.2  --  4.3  CL 103 106  --  104  CO2 24 27  --  26  GLUCOSE 128* 139*  --  133*  BUN 17 18  --  26*  CREATININE 1.01* 0.81  --  0.86  CALCIUM 8.7* 9.0  --  9.3  MG  --   --  1.9  --    GFR Estimated Creatinine Clearance: 57.1 mL/min (by C-G formula based on SCr of 0.86 mg/dL). Liver Function Tests: Recent Labs  Lab 05/12/23 0441  AST 11*  ALT 10  ALKPHOS 46  BILITOT 0.3  PROT 6.0*  ALBUMIN 3.0*   No results for input(s): "LIPASE", "AMYLASE" in the last 168 hours. No results for input(s): "AMMONIA" in the last 168 hours. Coagulation profile No results for input(s): "INR", "PROTIME" in the last 168 hours. COVID-19 Labs  No results for input(s): "DDIMER", "FERRITIN", "LDH", "CRP" in the last 72 hours.  Lab Results  Component Value Date   SARSCOV2NAA NEGATIVE 05/18/2022   SARSCOV2NAA POSITIVE (A) 10/31/2021   SARSCOV2NAA NEGATIVE  09/25/2020    CBC: Recent Labs  Lab 05/11/23 0900 05/12/23 0441 05/12/23 1002  WBC 14.1* 8.6 12.0*  HGB 10.9* 9.3* 9.9*  HCT 35.5* 30.9* 33.3*  MCV 85.3 85.1 86.9  PLT 409* 318 355   Cardiac Enzymes: No results for input(s): "CKTOTAL", "CKMB", "CKMBINDEX", "TROPONINI" in the last 168 hours. BNP (last 3 results) No results for input(s): "PROBNP" in the last 8760 hours. CBG: No results for input(s): "GLUCAP" in the last 168 hours. D-Dimer: No results for input(s): "DDIMER" in the last 72 hours. Hgb A1c: No results for input(s): "HGBA1C" in the last 72 hours. Lipid Profile: No results for input(s): "CHOL", "HDL", "LDLCALC", "TRIG", "CHOLHDL", "LDLDIRECT" in the last 72 hours. Thyroid function studies: No results for input(s): "TSH", "T4TOTAL", "T3FREE", "THYROIDAB" in the last 72 hours.  Invalid input(s): "FREET3" Anemia work up: No results for input(s): "VITAMINB12", "FOLATE", "FERRITIN", "TIBC", "IRON", "RETICCTPCT" in the last 72 hours. Sepsis Labs: Recent Labs  Lab 05/11/23 0900 05/12/23 0441 05/12/23 1002 05/13/23 0415  PROCALCITON  --   --   --  <0.10  WBC 14.1* 8.6 12.0*  --    Microbiology Recent Results (from the past 240 hour(s))  Blood culture (routine x 2)     Status: None (Preliminary result)   Collection Time: 05/11/23 10:40 AM   Specimen: Right Antecubital; Blood  Result Value Ref Range Status   Specimen Description   Final    RIGHT ANTECUBITAL Performed at [facility], [address], [place], [place] [address]    Special Requests   Final    BOTTLES DRAWN AEROBIC AND ANAEROBIC Blood Culture results may not be optimal due to an excessive volume of blood received in culture bottles Performed at [facility], [address] [place], [place] [address]    Culture   Final    NO GROWTH 2 DAYS Performed at [facility], [address] [place], [place] [address]    Report Status PENDING  Incomplete  Blood  culture (routine x 2)     Status: None (Preliminary result)   Collection Time: 05/11/23 10:40 AM   Specimen: Left Antecubital; Blood  Result Value Ref Range Status   Specimen Description   Final    LEFT ANTECUBITAL Performed at [facility], [address], [place], [place] [address]    Special Requests   Final    BOTTLES DRAWN AEROBIC AND ANAEROBIC Blood Culture adequate volume Performed at [facility], [address] [place], [place] [address]  Culture   Final    NO GROWTH 2 DAYS Performed at [facility], [address] [place], [place] [address]    Report Status PENDING  Incomplete     Medications:    budesonide (PULMICORT) nebulizer solution  0.5 mg Nebulization BID   enoxaparin (LOVENOX) injection  40 mg Subcutaneous Q24H   fluticasone  1 spray Each Nare BID   ipratropium-albuterol  3 mL Nebulization BID   methylPREDNISolone (SOLU-MEDROL) injection  40 mg Intravenous Q12H   montelukast  10 mg Oral QHS   pantoprazole  40 mg Oral Daily   simvastatin  40 mg Oral q1800   Continuous Infusions:  azithromycin 125 mL/hr at 05/12/23 1805   cefTRIAXone (ROCEPHIN)  IV 1 g (05/13/23 1035)      LOS: 1 day   Marinda Elk  [facility]  05/13/2023, 10:42 AM

## 2023-05-14 ENCOUNTER — Other Ambulatory Visit (HOSPITAL_COMMUNITY): Payer: Self-pay

## 2023-05-14 DIAGNOSIS — J441 Chronic obstructive pulmonary disease with (acute) exacerbation: Secondary | ICD-10-CM | POA: Diagnosis not present

## 2023-05-14 DIAGNOSIS — J189 Pneumonia, unspecified organism: Secondary | ICD-10-CM | POA: Diagnosis not present

## 2023-05-14 LAB — BASIC METABOLIC PANEL
Anion gap: 5 (ref 5–15)
BUN: 26 mg/dL — ABNORMAL HIGH (ref 8–23)
CO2: 28 mmol/L (ref 22–32)
Calcium: 8.9 mg/dL (ref 8.9–10.3)
Chloride: 106 mmol/L (ref 98–111)
Creatinine, Ser: 0.95 mg/dL (ref 0.44–1.00)
GFR, Estimated: >60 mL/min (ref >60)
Glucose, Bld: 90 mg/dL (ref 70–99)
Potassium: 4.2 mmol/L (ref 3.5–5.1)
Sodium: 139 mmol/L (ref 135–145)

## 2023-05-14 LAB — CULTURE, BLOOD (ROUTINE X 2)

## 2023-05-14 MED ORDER — AZITHROMYCIN 250 MG PO TABS
500.0000 mg | ORAL_TABLET | Freq: Every day | ORAL | Status: DC
  Administered 2023-05-14: 500 mg via ORAL
  Filled 2023-05-14: qty 2

## 2023-05-14 MED ORDER — IPRATROPIUM-ALBUTEROL 0.5-2.5 (3) MG/3ML IN SOLN: 3.0000 mL | Freq: Two times a day (BID) | RESPIRATORY_TRACT | Status: DC

## 2023-05-14 MED ORDER — CEFDINIR 300 MG PO CAPS
300.0000 mg | ORAL_CAPSULE | Freq: Two times a day (BID) | ORAL | 0 refills | Status: AC
  Filled 2023-05-14: qty 6, 3d supply, fill #0

## 2023-05-14 MED ORDER — AZITHROMYCIN 250 MG PO TABS
ORAL_TABLET | ORAL | 0 refills | Status: DC
  Filled 2023-05-14: qty 3, 3d supply, fill #0

## 2023-05-14 NOTE — Discharge Summary (Signed)
Physician Discharge Summary  Breanna Ramirez ZOX:[id number] DOB: 1956-08-01 DOA: 05/11/2023  PCP: Patient, No Pcp Per  Admit date: 05/11/2023 Discharge date: 05/14/2023  Admitted From: Home Disposition:  Home  Recommendations for Outpatient Follow-up:  Follow up with PCP in 1-2 weeks Please obtain BMP/CBC in one week   Home Health:No Equipment/Devices: Home oxygen  Discharge Condition:Stable CODE STATUS:Full Diet recommendation: Heart Healthy   Brief/Interim Summary: 67 y.o. female past medical history of COPD on nocturnal oxygen, hypertension came in with 2 weeks of shortness of breath found to be hypoxic with community-acquired pneumonia   Discharge Diagnoses:  Principal Problem:   CAP (community acquired pneumonia) Active Problems:   G E R D   Acute respiratory failure with hypoxia (HCC)   COPD with acute exacerbation (HCC)   Hyperlipidemia  Acute respiratory failure with hypoxia likely due to community-acquired pneumonia right lower lobe: She was started on IV empiric antibiotics she also had a mild COPD exacerbation she was started on steroids. She was continued on inhalers she defervesced respiratory panel was negative. She was ambulated and her saturations dropped she will go home on 3 L of oxygen and continue antibiotics and steroids as outpatient.  Hypokalemia: Repleted now improved.  GERD: Continue PPI.  Hyperlipidemia: Continue Zocor.  Discharge Instructions  Discharge Instructions     Diet - low sodium heart healthy   Complete by: As directed    Increase activity slowly   Complete by: As directed       Allergies as of 05/14/2023       Reactions   Codeine Nausea And Vomiting        Medication List     STOP taking these medications    amoxicillin-clavulanate 875-125 MG tablet Commonly known as: AUGMENTIN   predniSONE 10 MG tablet Commonly known as: DELTASONE       TAKE these medications    albuterol 108 (90 Base) MCG/ACT  inhaler Commonly known as: VENTOLIN HFA Inhale 1-2 puffs into the lungs every 6 (six) hours as needed for wheezing or shortness of breath.   azithromycin 250 MG tablet Commonly known as: ZITHROMAX Take 1 tablet daily Start taking on: May 15, 2023   cefdinir 300 MG capsule Commonly known as: OMNICEF Take 1 capsule (300 mg total) by mouth 2 (two) times daily for 3 days.   esomeprazole 40 MG capsule Commonly known as: NEXIUM Take 1 capsule (40 mg total) by mouth daily.   ferrous sulfate 325 (65 FE) MG tablet Take 1 tablet (325 mg total) by mouth daily.   fluticasone 50 MCG/ACT nasal spray Commonly known as: FLONASE Place 1 spray into both nostrils 2 (two) times daily.   hydrochlorothiazide 25 MG tablet Commonly known as: HYDRODIURIL Take 1 tablet (25 mg total) by mouth daily.   ipratropium 0.03 % nasal spray Commonly known as: ATROVENT Place 2 sprays into both nostrils every 12 (twelve) hours.   ipratropium-albuterol 0.5-2.5 (3) MG/3ML Soln Commonly known as: DUONEB SMARTSIG:1 Ampule(s) Via Nebulizer 3 Times Daily PRN What changed:  how much to take how to take this when to take this   montelukast 10 MG tablet Commonly known as: SINGULAIR Take 1 tablet (10 mg total) by mouth at bedtime.   simvastatin 40 MG tablet Commonly known as: ZOCOR Take 1 tablet (40 mg total) by mouth at bedtime. What changed: when to take this   Trelegy Ellipta 200-62.5-25 MCG/ACT Aepb Generic drug: Fluticasone-Umeclidin-Vilant Inhale 1 puff into the lungs daily.  Allergies  Allergen Reactions   Codeine Nausea And Vomiting    Consultations: None   Procedures/Studies: DG Chest 2 View  Result Date: 05/11/2023 CLINICAL DATA:  Shortness of breath.  Pneumonia EXAM: CHEST - 2 VIEW COMPARISON:  X-ray 05/18/2022.  CT 12/15/2022 FINDINGS: Normal cardiopericardial silhouette with calcified aorta. No pneumothorax. Hyperinflation identified. Areas of left midlung scar or atelectasis  identified. There is also some bandlike changes in the right lung base but a more confluence opacity as well in the right lung base. A subtle infiltrates possible in the middle lobe. No pleural effusion. Underlying chronic interstitial lung changes. Overlapping cardiac leads. IMPRESSION: Hyperinflation with chronic changes. There is more focal opacity over the right lung base. Acute infiltrate is possible recommend follow-up. Left midlung areas of scarring and atelectatic change Electronically Signed   By: Karen Kays M.D.   On: 05/11/2023 09:52   (Echo, Carotid, EGD, Colonoscopy, ERCP)    Subjective: No complaints  Discharge Exam: Vitals:   05/14/23 0546 05/14/23 0812  BP: 112/82   Pulse: 73   Resp: 20   Temp: 97.9 F (36.6 C)   SpO2: 99% 94%   Vitals:   05/13/23 1952 05/13/23 2058 05/14/23 0546 05/14/23 0812  BP:  (!) 114/58 112/82   Pulse:  (!) 108 73   Resp:  18 20   Temp:  97.9 F (36.6 C) 97.9 F (36.6 C)   TempSrc:  Oral Oral   SpO2: 92% 93% 99% 94%  Weight:      Height:        General: Pt is alert, awake, not in acute distress Cardiovascular: RRR, S1/S2 +, no rubs, no gallops Respiratory: CTA bilaterally, no wheezing, no rhonchi Abdominal: Soft, NT, ND, bowel sounds + Extremities: no edema, no cyanosis    The results of significant diagnostics from this hospitalization (including imaging, microbiology, ancillary and laboratory) are listed below for reference.     Microbiology: Recent Results (from the past 240 hour(s))  Blood culture (routine x 2)     Status: None (Preliminary result)   Collection Time: 05/11/23 10:40 AM   Specimen: Right Antecubital; Blood  Result Value Ref Range Status   Specimen Description   Final    RIGHT ANTECUBITAL Performed at [facility], [address] [place], [place] [address]    Special Requests   Final    BOTTLES DRAWN AEROBIC AND ANAEROBIC Blood Culture results may not be optimal due to an excessive  volume of blood received in culture bottles Performed at [facility], [address] [place], [place] [address]    Culture   Final    NO GROWTH 3 DAYS Performed at [facility], [address], [place], [place] [address]    Report Status PENDING  Incomplete  Blood culture (routine x 2)     Status: None (Preliminary result)   Collection Time: 05/11/23 10:40 AM   Specimen: Left Antecubital; Blood  Result Value Ref Range Status   Specimen Description   Final    LEFT ANTECUBITAL Performed at [facility], [address] [place], [place] [address]    Special Requests   Final    BOTTLES DRAWN AEROBIC AND ANAEROBIC Blood Culture adequate volume Performed at [facility], [address] [place], [place] [address]    Culture   Final    NO GROWTH 3 DAYS Performed at [facility], [address], [place], [place] [address]  Report Status PENDING  Incomplete  Respiratory (~20 pathogens) panel by PCR     Status: None   Collection Time: 05/13/23  6:02 AM   Specimen: Nasopharyngeal Swab; Respiratory  Result Value Ref Range Status   Adenovirus NOT DETECTED NOT DETECTED Final   Coronavirus 229E NOT DETECTED NOT DETECTED Final    Comment: (NOTE) The Coronavirus on the Respiratory Panel, DOES NOT test for the novel  Coronavirus (2019 nCoV)    Coronavirus HKU1 NOT DETECTED NOT DETECTED Final   Coronavirus NL63 NOT DETECTED NOT DETECTED Final   Coronavirus OC43 NOT DETECTED NOT DETECTED Final   Metapneumovirus NOT DETECTED NOT DETECTED Final   Rhinovirus / Enterovirus NOT DETECTED NOT DETECTED Final   Influenza A NOT DETECTED NOT DETECTED Final   Influenza B NOT DETECTED NOT DETECTED Final   Parainfluenza Virus 1 NOT DETECTED NOT DETECTED Final   Parainfluenza Virus 2 NOT DETECTED NOT DETECTED Final   Parainfluenza Virus 3 NOT DETECTED NOT DETECTED Final   Parainfluenza Virus 4 NOT DETECTED NOT DETECTED Final    Respiratory Syncytial Virus NOT DETECTED NOT DETECTED Final   Bordetella pertussis NOT DETECTED NOT DETECTED Final   Bordetella Parapertussis NOT DETECTED NOT DETECTED Final   Chlamydophila pneumoniae NOT DETECTED NOT DETECTED Final   Mycoplasma pneumoniae NOT DETECTED NOT DETECTED Final    Comment: Performed at [facility], [address] [place], [place] [address]     Labs: BNP (last 3 results) Recent Labs    05/18/22 1010 05/12/23 1002  BNP 80.4 184.9*   Basic Metabolic Panel: Recent Labs  Lab 05/11/23 0900 05/12/23 0441 05/12/23 1002 05/13/23 0415 05/14/23 0426  NA 137 140  --  139 139  K 3.4* 4.2  --  4.3 4.2  CL 103 106  --  104 106  CO2 24 27  --  26 28  GLUCOSE 128* 139*  --  133* 90  BUN 17 18  --  26* 26*  CREATININE 1.01* 0.81  --  0.86 0.95  CALCIUM 8.7* 9.0  --  9.3 8.9  MG  --   --  1.9  --   --    Liver Function Tests: Recent Labs  Lab 05/12/23 0441  AST 11*  ALT 10  ALKPHOS 46  BILITOT 0.3  PROT 6.0*  ALBUMIN 3.0*   No results for input(s): "LIPASE", "AMYLASE" in the last 168 hours. No results for input(s): "AMMONIA" in the last 168 hours. CBC: Recent Labs  Lab 05/11/23 0900 05/12/23 0441 05/12/23 1002  WBC 14.1* 8.6 12.0*  HGB 10.9* 9.3* 9.9*  HCT 35.5* 30.9* 33.3*  MCV 85.3 85.1 86.9  PLT 409* 318 355   Cardiac Enzymes: No results for input(s): "CKTOTAL", "CKMB", "CKMBINDEX", "TROPONINI" in the last 168 hours. BNP: Invalid input(s): "POCBNP" CBG: No results for input(s): "GLUCAP" in the last 168 hours. D-Dimer No results for input(s): "DDIMER" in the last 72 hours. Hgb A1c No results for input(s): "HGBA1C" in the last 72 hours. Lipid Profile No results for input(s): "CHOL", "HDL", "LDLCALC", "TRIG", "CHOLHDL", "LDLDIRECT" in the last 72 hours. Thyroid function studies No results for input(s): "TSH", "T4TOTAL", "T3FREE", "THYROIDAB" in the last 72 hours.  Invalid input(s): "FREET3" Anemia work up No results for  input(s): "VITAMINB12", "FOLATE", "FERRITIN", "TIBC", "IRON", "RETICCTPCT" in the last 72 hours. Urinalysis No results found for: "COLORURINE", "APPEARANCEUR", "LABSPEC", "PHURINE", "GLUCOSEU", "HGBUR", "BILIRUBINUR", "KETONESUR", "PROTEINUR", "UROBILINOGEN", "NITRITE", "LEUKOCYTESUR" Sepsis Labs Recent Labs  Lab 05/11/23 0900 05/12/23 0441 05/12/23 1002  WBC 14.1* 8.6 12.0*   Microbiology Recent Results (from the past 240 hour(s))  Blood culture (routine x 2)     Status: None (Preliminary result)   Collection Time: 05/11/23 10:40 AM   Specimen: Right Antecubital; Blood  Result Value Ref Range Status   Specimen Description   Final    RIGHT ANTECUBITAL Performed at [facility], [address] [place], [place] [address]    Special Requests   Final    BOTTLES DRAWN AEROBIC AND ANAEROBIC Blood Culture results may not be optimal due to an excessive volume of blood received in culture bottles Performed at [facility], [address], [place], [place] [address]    Culture   Final    NO GROWTH 3 DAYS Performed at [facility], [address] [place], [place] [address]    Report Status PENDING  Incomplete  Blood culture (routine x 2)     Status: None (Preliminary result)   Collection Time: 05/11/23 10:40 AM   Specimen: Left Antecubital; Blood  Result Value Ref Range Status   Specimen Description   Final    LEFT ANTECUBITAL Performed at [facility], [address], [place], [place] [address]    Special Requests   Final    BOTTLES DRAWN AEROBIC AND ANAEROBIC Blood Culture adequate volume Performed at [facility], [address] [place], [place] [address]    Culture   Final    NO GROWTH 3 DAYS Performed at [facility], [address] [place], [place] [address]    Report Status PENDING  Incomplete  Respiratory (~20 pathogens) panel by PCR     Status: None   Collection Time: 05/13/23   6:02 AM   Specimen: Nasopharyngeal Swab; Respiratory  Result Value Ref Range Status   Adenovirus NOT DETECTED NOT DETECTED Final   Coronavirus 229E NOT DETECTED NOT DETECTED Final    Comment: (NOTE) The Coronavirus on the Respiratory Panel, DOES NOT test for the novel  Coronavirus (2019 nCoV)    Coronavirus HKU1 NOT DETECTED NOT DETECTED Final   Coronavirus NL63 NOT DETECTED NOT DETECTED Final   Coronavirus OC43 NOT DETECTED NOT DETECTED Final   Metapneumovirus NOT DETECTED NOT DETECTED Final   Rhinovirus / Enterovirus NOT DETECTED NOT DETECTED Final   Influenza A NOT DETECTED NOT DETECTED Final   Influenza B NOT DETECTED NOT DETECTED Final   Parainfluenza Virus 1 NOT DETECTED NOT DETECTED Final   Parainfluenza Virus 2 NOT DETECTED NOT DETECTED Final   Parainfluenza Virus 3 NOT DETECTED NOT DETECTED Final   Parainfluenza Virus 4 NOT DETECTED NOT DETECTED Final   Respiratory Syncytial Virus NOT DETECTED NOT DETECTED Final   Bordetella pertussis NOT DETECTED NOT DETECTED Final   Bordetella Parapertussis NOT DETECTED NOT DETECTED Final   Chlamydophila pneumoniae NOT DETECTED NOT DETECTED Final   Mycoplasma pneumoniae NOT DETECTED NOT DETECTED Final    Comment: Performed at [facility], [address] [place], [place] [address]     SIGNED:   Marinda Elk, MD  [facility] 05/14/2023, 8:45 AM Pager   If 7PM-7AM, please contact night-coverage [email or web address] Password [email or web address]

## 2023-05-14 NOTE — Progress Notes (Signed)
PHARMACIST - PHYSICIAN COMMUNICATION DR:   David Stall CONCERNING: Antibiotic IV to Oral Route Change Policy  RECOMMENDATION: This patient is receiving azithromycin by the intravenous route.  Based on criteria approved by the Pharmacy and Therapeutics Committee, the antibiotic(s) is/are being converted to the equivalent oral dose form(s).   DESCRIPTION: These criteria include: Patient being treated for a respiratory tract infection, urinary tract infection, cellulitis or clostridium difficile associated diarrhea if on metronidazole The patient is not neutropenic and does not exhibit a GI malabsorption state The patient is eating (either orally or via tube) and/or has been taking other orally administered medications for a least 24 hours The patient is improving clinically and has a Tmax < 100.5  If you have questions about this conversion, please contact the Pharmacy Department  []   [phone] )  [facility] []   [phone] )  [facility]  []   [phone] )  [facility] [x]   [phone] )  [facility]     Thank you for allowing pharmacy to be a part of this patient's care.  Selinda Eon, PharmD, BCPS Clinical Pharmacist [place] Please utilize Amion for appropriate phone number to reach the unit pharmacist [facility]) 05/14/2023 7:46 AM

## 2023-05-15 LAB — CULTURE, BLOOD (ROUTINE X 2): Special Requests: ADEQUATE

## 2023-05-16 LAB — CULTURE, BLOOD (ROUTINE X 2): Culture: NO GROWTH

## 2023-05-23 DIAGNOSIS — E785 Hyperlipidemia, unspecified: Secondary | ICD-10-CM | POA: Diagnosis not present

## 2023-05-23 DIAGNOSIS — D75839 Thrombocytosis, unspecified: Secondary | ICD-10-CM | POA: Diagnosis not present

## 2023-05-23 DIAGNOSIS — J449 Chronic obstructive pulmonary disease, unspecified: Secondary | ICD-10-CM | POA: Diagnosis not present

## 2023-09-21 ENCOUNTER — Inpatient Hospital Stay (HOSPITAL_COMMUNITY)
Admission: EM | Admit: 2023-09-21 | Discharge: 2023-09-24 | DRG: 193 | Disposition: A | Discharge status: Home / Self Care | Payer: BC Managed Care – PPO | Attending: Internal Medicine | Admitting: Internal Medicine

## 2023-09-21 ENCOUNTER — Encounter (HOSPITAL_COMMUNITY): Payer: Self-pay

## 2023-09-21 ENCOUNTER — Emergency Department (HOSPITAL_COMMUNITY): Payer: BC Managed Care – PPO

## 2023-09-21 ENCOUNTER — Other Ambulatory Visit: Payer: Self-pay

## 2023-09-21 DIAGNOSIS — J440 Chronic obstructive pulmonary disease with acute lower respiratory infection: Secondary | ICD-10-CM | POA: Diagnosis present

## 2023-09-21 DIAGNOSIS — Z9981 Dependence on supplemental oxygen: Secondary | ICD-10-CM

## 2023-09-21 DIAGNOSIS — Z1152 Encounter for screening for COVID-19: Secondary | ICD-10-CM

## 2023-09-21 DIAGNOSIS — Z885 Allergy status to narcotic agent status: Secondary | ICD-10-CM

## 2023-09-21 DIAGNOSIS — R051 Acute cough: Secondary | ICD-10-CM | POA: Diagnosis not present

## 2023-09-21 DIAGNOSIS — R0609 Other forms of dyspnea: Secondary | ICD-10-CM | POA: Diagnosis present

## 2023-09-21 DIAGNOSIS — Z87891 Personal history of nicotine dependence: Secondary | ICD-10-CM

## 2023-09-21 DIAGNOSIS — D509 Iron deficiency anemia, unspecified: Secondary | ICD-10-CM | POA: Diagnosis present

## 2023-09-21 DIAGNOSIS — I10 Essential (primary) hypertension: Secondary | ICD-10-CM | POA: Diagnosis present

## 2023-09-21 DIAGNOSIS — J9621 Acute and chronic respiratory failure with hypoxia: Secondary | ICD-10-CM | POA: Diagnosis not present

## 2023-09-21 DIAGNOSIS — J8410 Pulmonary fibrosis, unspecified: Secondary | ICD-10-CM | POA: Diagnosis present

## 2023-09-21 DIAGNOSIS — I5032 Chronic diastolic (congestive) heart failure: Secondary | ICD-10-CM | POA: Diagnosis present

## 2023-09-21 DIAGNOSIS — Z79899 Other long term (current) drug therapy: Secondary | ICD-10-CM

## 2023-09-21 DIAGNOSIS — J189 Pneumonia, unspecified organism: Secondary | ICD-10-CM | POA: Diagnosis not present

## 2023-09-21 DIAGNOSIS — J9611 Chronic respiratory failure with hypoxia: Secondary | ICD-10-CM

## 2023-09-21 DIAGNOSIS — I110 Hypertensive heart disease with heart failure: Secondary | ICD-10-CM | POA: Diagnosis present

## 2023-09-21 DIAGNOSIS — J441 Chronic obstructive pulmonary disease with (acute) exacerbation: Secondary | ICD-10-CM | POA: Diagnosis present

## 2023-09-21 DIAGNOSIS — Z7951 Long term (current) use of inhaled steroids: Secondary | ICD-10-CM

## 2023-09-21 DIAGNOSIS — K219 Gastro-esophageal reflux disease without esophagitis: Secondary | ICD-10-CM | POA: Diagnosis present

## 2023-09-21 DIAGNOSIS — J439 Emphysema, unspecified: Secondary | ICD-10-CM | POA: Diagnosis present

## 2023-09-21 DIAGNOSIS — E785 Hyperlipidemia, unspecified: Secondary | ICD-10-CM | POA: Diagnosis present

## 2023-09-21 LAB — CBC WITH DIFFERENTIAL/PLATELET
Abs Immature Granulocytes: 0.09 10*3/uL — ABNORMAL HIGH (ref 0.00–0.07)
Basophils Absolute: 0.1 10*3/uL (ref 0.0–0.1)
Basophils Relative: 0 %
Eosinophils Absolute: 0.2 10*3/uL (ref 0.0–0.5)
Eosinophils Relative: 1 %
HCT: 28.1 % — ABNORMAL LOW (ref 36.0–46.0)
Hemoglobin: 8.1 g/dL — ABNORMAL LOW (ref 12.0–15.0)
Immature Granulocytes: 1 %
Lymphocytes Relative: 15 %
Lymphs Abs: 2.2 10*3/uL (ref 0.7–4.0)
MCH: 21.7 pg — ABNORMAL LOW (ref 26.0–34.0)
MCHC: 28.8 g/dL — ABNORMAL LOW (ref 30.0–36.0)
MCV: 75.1 fL — ABNORMAL LOW (ref 80.0–100.0)
Monocytes Absolute: 1.3 10*3/uL — ABNORMAL HIGH (ref 0.1–1.0)
Monocytes Relative: 9 %
Neutro Abs: 10.6 10*3/uL — ABNORMAL HIGH (ref 1.7–7.7)
Neutrophils Relative %: 74 %
Platelets: 418 10*3/uL — ABNORMAL HIGH (ref 150–400)
RBC: 3.74 MIL/uL — ABNORMAL LOW (ref 3.87–5.11)
RDW: 23.7 % — ABNORMAL HIGH (ref 11.5–15.5)
WBC: 14.3 10*3/uL — ABNORMAL HIGH (ref 4.0–10.5)
nRBC: 0 % (ref 0.0–0.2)

## 2023-09-21 LAB — COMPREHENSIVE METABOLIC PANEL
ALT: 12 U/L (ref 0–44)
AST: 14 U/L — ABNORMAL LOW (ref 15–41)
Albumin: 3.5 g/dL (ref 3.5–5.0)
Alkaline Phosphatase: 50 U/L (ref 38–126)
Anion gap: 13 (ref 5–15)
BUN: 19 mg/dL (ref 8–23)
CO2: 26 mmol/L (ref 22–32)
Calcium: 9.3 mg/dL (ref 8.9–10.3)
Chloride: 98 mmol/L (ref 98–111)
Creatinine, Ser: 0.84 mg/dL (ref 0.44–1.00)
GFR, Estimated: >60 mL/min (ref >60)
Glucose, Bld: 83 mg/dL (ref 70–99)
Potassium: 3.4 mmol/L — ABNORMAL LOW (ref 3.5–5.1)
Sodium: 137 mmol/L (ref 135–145)
Total Bilirubin: 0.7 mg/dL (ref 0.3–1.2)
Total Protein: 6.5 g/dL (ref 6.5–8.1)

## 2023-09-21 LAB — IRON AND TIBC
Iron: 24 ug/dL — ABNORMAL LOW (ref 28–170)
Saturation Ratios: 5 % — ABNORMAL LOW (ref 10.4–31.8)
TIBC: 531 ug/dL — ABNORMAL HIGH (ref 250–450)
UIBC: 507 ug/dL

## 2023-09-21 LAB — FOLATE: Folate: 24.1 ng/mL (ref >5.9)

## 2023-09-21 LAB — FERRITIN: Ferritin: 12 ng/mL (ref 11–307)

## 2023-09-21 LAB — CBC
HCT: 32.7 % — ABNORMAL LOW (ref 36.0–46.0)
Hemoglobin: 9.1 g/dL — ABNORMAL LOW (ref 12.0–15.0)
MCH: 21.7 pg — ABNORMAL LOW (ref 26.0–34.0)
MCHC: 27.8 g/dL — ABNORMAL LOW (ref 30.0–36.0)
MCV: 78 fL — ABNORMAL LOW (ref 80.0–100.0)
Platelets: 493 10*3/uL — ABNORMAL HIGH (ref 150–400)
RBC: 4.19 MIL/uL (ref 3.87–5.11)
RDW: 24.5 % — ABNORMAL HIGH (ref 11.5–15.5)
WBC: 11.9 10*3/uL — ABNORMAL HIGH (ref 4.0–10.5)
nRBC: 0 % (ref 0.0–0.2)

## 2023-09-21 LAB — RETICULOCYTES
Immature Retic Fract: 28.4 % — ABNORMAL HIGH (ref 2.3–15.9)
RBC.: 4.12 MIL/uL (ref 3.87–5.11)
Retic Count, Absolute: 181.3 10*3/uL (ref 19.0–186.0)
Retic Ct Pct: 4.4 % — ABNORMAL HIGH (ref 0.4–3.1)

## 2023-09-21 LAB — PROCALCITONIN: Procalcitonin: <0.1 ng/mL

## 2023-09-21 LAB — CREATININE, SERUM
Creatinine, Ser: 0.89 mg/dL (ref 0.44–1.00)
GFR, Estimated: >60 mL/min (ref >60)

## 2023-09-21 LAB — BRAIN NATRIURETIC PEPTIDE: B Natriuretic Peptide: 76.2 pg/mL (ref 0.0–100.0)

## 2023-09-21 LAB — RESP PANEL BY RT-PCR (RSV, FLU A&B, COVID)  RVPGX2
Influenza A by PCR: NEGATIVE
Influenza B by PCR: NEGATIVE
Resp Syncytial Virus by PCR: NEGATIVE
SARS Coronavirus 2 by RT PCR: NEGATIVE

## 2023-09-21 LAB — HIV ANTIBODY (ROUTINE TESTING W REFLEX): HIV Screen 4th Generation wRfx: NONREACTIVE

## 2023-09-21 LAB — VITAMIN B12: Vitamin B-12: 386 pg/mL (ref 180–914)

## 2023-09-21 MED ORDER — ONDANSETRON HCL 4 MG/2ML IJ SOLN: 4.0000 mg | Freq: Four times a day (QID) | INTRAMUSCULAR | Status: DC | PRN

## 2023-09-21 MED ORDER — ENOXAPARIN SODIUM 40 MG/0.4ML IJ SOSY
40.0000 mg | PREFILLED_SYRINGE | Freq: Every day | INTRAMUSCULAR | Status: DC
  Administered 2023-09-21 – 2023-09-22 (×2): 40 mg via SUBCUTANEOUS
  Filled 2023-09-21 (×2): qty 0.4

## 2023-09-21 MED ORDER — ACETAMINOPHEN 650 MG RE SUPP: 650.0000 mg | Freq: Four times a day (QID) | RECTAL | Status: DC | PRN

## 2023-09-21 MED ORDER — FERROUS SULFATE 325 (65 FE) MG PO TABS
325.0000 mg | ORAL_TABLET | Freq: Every day | ORAL | Status: DC
  Administered 2023-09-21 – 2023-09-22 (×2): 325 mg via ORAL
  Filled 2023-09-21 (×2): qty 1

## 2023-09-21 MED ORDER — IPRATROPIUM-ALBUTEROL 0.5-2.5 (3) MG/3ML IN SOLN
3.0000 mL | Freq: Four times a day (QID) | RESPIRATORY_TRACT | Status: DC
  Administered 2023-09-21 – 2023-09-22 (×2): 3 mL via RESPIRATORY_TRACT
  Filled 2023-09-21 (×2): qty 3

## 2023-09-21 MED ORDER — BUDESONIDE 0.5 MG/2ML IN SUSP
0.5000 mg | Freq: Two times a day (BID) | RESPIRATORY_TRACT | Status: DC
  Administered 2023-09-21 – 2023-09-22 (×2): 0.5 mg via RESPIRATORY_TRACT
  Filled 2023-09-21 (×2): qty 2

## 2023-09-21 MED ORDER — METHYLPREDNISOLONE SODIUM SUCC 40 MG IJ SOLR
40.0000 mg | Freq: Two times a day (BID) | INTRAMUSCULAR | Status: DC
  Administered 2023-09-21 – 2023-09-22 (×3): 40 mg via INTRAVENOUS
  Filled 2023-09-21 (×3): qty 1

## 2023-09-21 MED ORDER — SODIUM CHLORIDE 0.9 % IV SOLN
1.0000 g | Freq: Once | INTRAVENOUS | Status: AC
  Administered 2023-09-21: 1 g via INTRAVENOUS
  Filled 2023-09-21: qty 10

## 2023-09-21 MED ORDER — PANTOPRAZOLE SODIUM 40 MG PO TBEC
40.0000 mg | DELAYED_RELEASE_TABLET | Freq: Every day | ORAL | Status: DC
  Administered 2023-09-21 – 2023-09-24 (×4): 40 mg via ORAL
  Filled 2023-09-21 (×4): qty 1

## 2023-09-21 MED ORDER — MONTELUKAST SODIUM 10 MG PO TABS
10.0000 mg | ORAL_TABLET | Freq: Every day | ORAL | Status: DC
  Administered 2023-09-21 – 2023-09-23 (×3): 10 mg via ORAL
  Filled 2023-09-21 (×3): qty 1

## 2023-09-21 MED ORDER — ACETAMINOPHEN 325 MG PO TABS
650.0000 mg | ORAL_TABLET | Freq: Four times a day (QID) | ORAL | Status: DC | PRN
  Administered 2023-09-23: 650 mg via ORAL
  Filled 2023-09-21: qty 2

## 2023-09-21 MED ORDER — SODIUM CHLORIDE 0.9 % IV SOLN
500.0000 mg | INTRAVENOUS | Status: DC
  Administered 2023-09-21: 500 mg via INTRAVENOUS
  Filled 2023-09-21 (×2): qty 5

## 2023-09-21 MED ORDER — SIMVASTATIN 40 MG PO TABS
40.0000 mg | ORAL_TABLET | Freq: Every day | ORAL | Status: DC
  Administered 2023-09-21 – 2023-09-23 (×3): 40 mg via ORAL
  Filled 2023-09-21 (×3): qty 1

## 2023-09-21 MED ORDER — SODIUM CHLORIDE 0.9% FLUSH
3.0000 mL | Freq: Two times a day (BID) | INTRAVENOUS | Status: DC
  Administered 2023-09-21 – 2023-09-24 (×6): 3 mL via INTRAVENOUS

## 2023-09-21 MED ORDER — ONDANSETRON HCL 4 MG PO TABS: 4.0000 mg | ORAL_TABLET | Freq: Four times a day (QID) | ORAL | Status: DC | PRN

## 2023-09-21 MED ORDER — HYDROCHLOROTHIAZIDE 25 MG PO TABS
25.0000 mg | ORAL_TABLET | Freq: Every day | ORAL | Status: DC
  Administered 2023-09-21 – 2023-09-22 (×2): 25 mg via ORAL
  Filled 2023-09-21 (×2): qty 1

## 2023-09-21 NOTE — ED Provider Notes (Signed)
[facility] DEPARTMENT AT [facility] Provider Note   CSN: [id number] Arrival date & time: 09/21/23  7829     History  Chief Complaint  Patient presents with   Nasal Congestion   Cough    Breanna Ramirez is a 67 y.o. female with history of COPD, hypertension, who presents with concern for 1 week of dry cough.  States she was evaluated by an NP at work and given 5 days of levofloxacin and prednisone.  States this has not improved her symptoms. Reports occasional shortness of breath.  Denies any fever or chills, no sputum production.  States this feels like a previous pneumonia.   Cough      Home Medications Prior to Admission medications   Medication Sig Start Date End Date Taking? Authorizing Provider  albuterol (PROVENTIL HFA;VENTOLIN HFA) 108 (90 Base) MCG/ACT inhaler Inhale 1-2 puffs into the lungs every 6 (six) hours as needed for wheezing or shortness of breath.    [provider]  azithromycin (ZITHROMAX) 250 MG tablet Take 1 tablet daily 05/15/23   Marinda Elk, MD  esomeprazole (NEXIUM) 40 MG capsule Take 1 capsule (40 mg total) by mouth daily. 09/13/14   Jannifer Rodney A, FNP  ferrous sulfate 325 (65 FE) MG tablet Take 1 tablet (325 mg total) by mouth daily. Patient not taking: Reported on 05/11/2023 10/23/18 08/25/22  Marinda Elk, MD  fluticasone [facility]) 50 MCG/ACT nasal spray Place 1 spray into both nostrils 2 (two) times daily. Patient not taking: Reported on 05/11/2023 09/30/21   [provider]  Fluticasone-Umeclidin-Vilant (TRELEGY ELLIPTA) 200-62.5-25 MCG/ACT AEPB Inhale 1 puff into the lungs daily. 08/25/22   Martina Sinner, MD  hydrochlorothiazide (HYDRODIURIL) 25 MG tablet Take 1 tablet (25 mg total) by mouth daily. 09/13/14   Jannifer Rodney A, FNP  ipratropium (ATROVENT) 0.03 % nasal spray Place 2 sprays into both nostrils every 12 (twelve) hours. 12/16/22   Bevelyn Ngo, NP  ipratropium-albuterol (DUONEB)  0.5-2.5 (3) MG/3ML SOLN SMARTSIG:1 Ampule(s) Via Nebulizer 3 Times Daily PRN Patient taking differently: Take 3 mLs by nebulization See admin instructions. SMARTSIG:1 Ampule(s) Via Nebulizer 3 Times Daily PRN 02/12/22   Martina Sinner, MD  montelukast (SINGULAIR) 10 MG tablet Take 1 tablet (10 mg total) by mouth at bedtime. 08/25/22   Martina Sinner, MD  simvastatin (ZOCOR) 40 MG tablet Take 1 tablet (40 mg total) by mouth at bedtime. Patient taking differently: Take 40 mg by mouth daily at 6 PM. 09/15/14   Junie Spencer, FNP      Allergies    Codeine    Review of Systems   Review of Systems  Respiratory:  Positive for cough.     Physical Exam Updated Vital Signs BP (!) 110/59 (BP Location: Left Arm)   Pulse 73   Temp 97.8 F (36.6 C) (Oral)   Resp 18   Ht 5\' 5"  (1.651 m)   Wt 59 kg   SpO2 93%   BMI 21.63 kg/m  Physical Exam Vitals and nursing note reviewed.  Constitutional:      General: She is not in acute distress.    Appearance: She is well-developed.     Comments: Well appearing  HENT:     Head: Normocephalic and atraumatic.  Eyes:     Conjunctiva/sclera: Conjunctivae normal.  Cardiovascular:     Rate and Rhythm: Normal rate and regular rhythm.     Heart sounds: No murmur heard. Pulmonary:  Effort: Pulmonary effort is normal. No respiratory distress.     Comments: Wheezing in lung fields bilaterally Abdominal:     Palpations: Abdomen is soft.     Tenderness: There is no abdominal tenderness.  Musculoskeletal:        General: No swelling.     Cervical back: Neck supple.  Skin:    General: Skin is warm and dry.     Capillary Refill: Capillary refill takes less than 2 seconds.  Neurological:     Mental Status: She is alert.  Psychiatric:        Mood and Affect: Mood normal.     ED Results / Procedures / Treatments   Labs (all labs ordered are listed, but only abnormal results are displayed) Labs Reviewed  CBC WITH DIFFERENTIAL/PLATELET -  Abnormal; Notable for the following components:      Result Value   WBC 14.3 (*)    RBC 3.74 (*)    Hemoglobin 8.1 (*)    HCT 28.1 (*)    MCV 75.1 (*)    MCH 21.7 (*)    MCHC 28.8 (*)    RDW 23.7 (*)    Platelets 418 (*)    Neutro Abs 10.6 (*)    Monocytes Absolute 1.3 (*)    Abs Immature Granulocytes 0.09 (*)    All other components within normal limits  COMPREHENSIVE METABOLIC PANEL - Abnormal; Notable for the following components:   Potassium 3.4 (*)    AST 14 (*)    All other components within normal limits  RESP PANEL BY RT-PCR (RSV, FLU A&B, COVID)  [id number]    EKG None  Radiology DG Chest 2 View  Result Date: 09/21/2023 CLINICAL DATA:  Shortness of breath. EXAM: CHEST - 2 VIEW COMPARISON:  May 11, 2023. FINDINGS: The heart size and mediastinal contours are within normal limits. Hyperexpansion of the lungs is again noted. Bilateral diffuse interstitial densities are noted which may represent chronic interstitial lung disease, but acute superimposed edema or atypical inflammation cannot be excluded. The visualized skeletal structures are unremarkable. IMPRESSION: Hyperexpansion of the lungs. Bilateral diffuse interstitial densities as described above. Aortic Atherosclerosis (ICD10-I70.0). Electronically Signed   By: Lupita Raider M.D.   On: 09/21/2023 11:33    Procedures Procedures    Medications Ordered in ED Medications  azithromycin (ZITHROMAX) 500 mg in sodium chloride 0.9 % 250 mL IVPB (0 mg Intravenous Stopped 09/21/23 1344)  cefTRIAXone (ROCEPHIN) 1 g in sodium chloride 0.9 % 100 mL IVPB (0 g Intravenous Stopped 09/21/23 1315)    ED Course/ Medical Decision Making/ A&P                                 Medical Decision Making Amount and/or Complexity of Data Reviewed Labs: ordered. Radiology: ordered.  Risk Decision regarding hospitalization.   67 y.o. female with pertinent past medical history of COPD presents to the ED for concern of cough and shortness  of breath for 1 week  Differential diagnosis includes but is not limited to bronchitis, pneumonia, COVID, flu, URI  ED Course:  Patient overall well-appearing but does have a slightly low oxygen saturation at 93% on room air, but has a history of COPD and breathing comfortably.  She reported cough for the past week which has not improved on 5 days of levofloxacin and prednisone. She was found to have a leukocytosis of 14.3.  Her chest x-ray was concerning  for bilateral interstitial densities, CT chest was obtained for further evaluation, pending results.  COVID, flu, RSV negative. Patient started on ceftriaxone and azithromycin IV for likely pneumonia Consulted hospitalist who will plan on evaluating patient and likely admission for pneumonia.  Impression: Cough and shortness of breath for 1 week, likely pneumonia  Disposition:  Likely admission with hospitalist Allision Rennis Harding for further treatment, pending her evaluation of the patient  Lab Tests: I Ordered, and personally interpreted labs.  The pertinent results include:   CBC with leukocytosis of 14.3 CMP unremarkable Flu, COVID, RSV negative  Imaging Studies ordered: I ordered imaging studies including chest x-ray, CT chest I independently visualized the imaging with scope of interpretation limited to determining acute life threatening conditions related to emergency care.  Chest x-ray revealed bilateral diffuse interstitial densities I agree with the radiologist interpretation    Consultations Obtained: I requested consultation with the hospitalist Junious Silk NP,  and discussed lab and imaging findings as well as pertinent plan -they will come to evaluate patient and plan on admission for further IV antibiotics and monitoring of hypoxemia  Co morbidities that complicate the patient evaluation  COPD, hypertension              Final Clinical Impression(s) / ED Diagnoses Final diagnoses:  Acute cough    Rx /  DC Orders ED Discharge Orders     None         Arabella Merles, PA-C 09/21/23 1542    Anders Simmonds T, DO 09/22/23 [phone]

## 2023-09-21 NOTE — ED Triage Notes (Signed)
Pt c/o SOB for about a week, chest and nasal congestion, and upper back pain for about a week. Pt uses oxygen to sleep. Pt has a dry cough that worsens when supine.

## 2023-09-21 NOTE — H&P (Signed)
History and Physical    Patient: Breanna Ramirez ZOX:[id number] DOB: 1956-03-07 DOA: 09/21/2023 DOS: the patient was seen and examined on 09/21/2023 PCP: Salli Real, MD  Patient coming from: Home Discharge disposition: Home  Chief Complaint:  Chief Complaint  Patient presents with   Nasal Congestion   Cough   HPI: Breanna Ramirez is a 67 y.o. female with medical history significant of pulmonary fibrosis on chronic nocturnal oxygen, hypertension, microcytic anemia and dyslipidemia.  Patient presented to the ED after complaining of 7 days of dry cough, shortness of breath especially with ambulation.  She had not had any fevers or chills.  She had been prescribed Levaquin in the outpatient setting without any improvement in her symptoms.  She had also been using her nebulizers without any improvement in symptoms.  Her primary pulmonologist is Dr. Francine Graven.  Because of the symptoms she presented to the ED.  In the ER she was afebrile with room air sats initially in her stable baseline of 95 to 96% and is of subsequently decreased to 90 to 93%.  She continues to have a dry cough.  Her chest x-ray revealed hyper expansion of the lungs consistent with her history of COPD.  She was also noted to have bilateral diffuse interstitial densities which is consistent with her underlying history of pulmonary fibrosis therefore CT of the chest has been ordered but has not resulted.  Her labs did reveal white count of 14,300 without left shift.  Hemoglobin was 8.1 with an MCV of 75.1.  Renal function normal.  Upon my evaluation of the patient she reported that she had 1 episode of orthopnea but has otherwise been able to sleep without sitting up.  She confirmed no fevers or chills or sick contacts.  She has noticed increased dyspnea on exertion as well as swelling in her legs noting she has been less active and sitting with her feet dependent.  She does take hydrochlorothiazide for her blood pressure.  Hospital service  has been asked to evaluate the patient for admission.   Review of Systems: As mentioned in the history of present illness. All other systems reviewed and are negative. Past Medical History:  Diagnosis Date   COPD (chronic obstructive pulmonary disease) (HCC)    Essential hypertension    GERD (gastroesophageal reflux disease)    Pneumonia 05/2017   Respiratory failure with hypoxia (HCC) 05/2017   Past Surgical History:  Procedure Laterality Date   ENDOMETRIAL ABLATION     Social History:  reports that she quit smoking about 5 years ago. Her smoking use included cigarettes. She started smoking about 50 years ago. She has a 45 pack-year smoking history. She has never used smokeless tobacco. She reports that she does not drink alcohol and does not use drugs.  Allergies  Allergen Reactions   Codeine Nausea And Vomiting    Family History  Problem Relation Age of Onset   Cancer Father     Prior to Admission medications   Medication Sig Start Date End Date Taking? Authorizing Provider  albuterol (PROVENTIL HFA;VENTOLIN HFA) 108 (90 Base) MCG/ACT inhaler Inhale 1-2 puffs into the lungs every 6 (six) hours as needed for wheezing or shortness of breath.    [provider]  azithromycin (ZITHROMAX) 250 MG tablet Take 1 tablet daily 05/15/23   Marinda Elk, MD  esomeprazole (NEXIUM) 40 MG capsule Take 1 capsule (40 mg total) by mouth daily. 09/13/14   Junie Spencer, FNP  ferrous sulfate 325 (65  FE) MG tablet Take 1 tablet (325 mg total) by mouth daily. Patient not taking: Reported on 05/11/2023 10/23/18 08/25/22  Marinda Elk, MD  fluticasone [facility]) 50 MCG/ACT nasal spray Place 1 spray into both nostrils 2 (two) times daily. Patient not taking: Reported on 05/11/2023 09/30/21   [provider]  Fluticasone-Umeclidin-Vilant (TRELEGY ELLIPTA) 200-62.5-25 MCG/ACT AEPB Inhale 1 puff into the lungs daily. 08/25/22   Martina Sinner, MD  hydrochlorothiazide  (HYDRODIURIL) 25 MG tablet Take 1 tablet (25 mg total) by mouth daily. 09/13/14   Jannifer Rodney A, FNP  ipratropium (ATROVENT) 0.03 % nasal spray Place 2 sprays into both nostrils every 12 (twelve) hours. 12/16/22   Bevelyn Ngo, NP  ipratropium-albuterol (DUONEB) 0.5-2.5 (3) MG/3ML SOLN SMARTSIG:1 Ampule(s) Via Nebulizer 3 Times Daily PRN Patient taking differently: Take 3 mLs by nebulization See admin instructions. SMARTSIG:1 Ampule(s) Via Nebulizer 3 Times Daily PRN 02/12/22   Martina Sinner, MD  montelukast (SINGULAIR) 10 MG tablet Take 1 tablet (10 mg total) by mouth at bedtime. 08/25/22   Martina Sinner, MD  simvastatin (ZOCOR) 40 MG tablet Take 1 tablet (40 mg total) by mouth at bedtime. Patient taking differently: Take 40 mg by mouth daily at 6 PM. 09/15/14   Junie Spencer, FNP    Physical Exam: Vitals:   09/21/23 0909 09/21/23 0913 09/21/23 1415  BP: 136/67  (!) 110/59  Pulse: (!) 105  73  Resp: 20  18  Temp: 98.8 F (37.1 C)  97.8 F (36.6 C)  TempSrc: Oral  Oral  SpO2: 95%  93%  Weight:  59 kg   Height:  5\' 5"  (1.651 m)    Constitutional: NAD, calm, comfortable Respiratory: Coarse to auscultation bilaterally and somewhat diminished in the right mid field, no wheezing, no crackles. Normal respiratory effort. No accessory muscle use.  Room air with sats between 91 and 93% at rest.  Ambulatory pulse oximetry pending Cardiovascular: Regular rate and rhythm, no murmurs / rubs / gallops.  Bilateral 1+ lower extremity edema primarily below the knees. 2+ pedal pulses. Abdomen: no tenderness, no masses palpated. No hepatosplenomegaly. Bowel sounds positive.  Musculoskeletal: no clubbing / cyanosis. No joint deformity upper and lower extremities. Good ROM, no contractures. Normal muscle tone.  Skin: no rashes, lesions, ulcers. No induration Neurologic: CN 2-12 grossly intact. Sensation intact,  Strength 5/5 x all 4 extremities.  Psychiatric: Normal judgment and insight. Alert  and oriented x 3. Normal mood.     Data Reviewed:  As per HPI  Assessment and Plan: Acute on chronic hypoxemic respiratory failure Underlying pulmonary fibrosis and COPD Respiratory PCR panel for flu, COVID and RSV negative therefore will obtain the 20 pathogen respiratory viral panel Patient did present with leukocytosis therefore cannot exclude bacterial pneumonia; continue community-acquired pneumonia protocol with Rocephin and Zithromax Begin Solu-Medrol 40 mg IV every 12 hours along with budesonide nebs every 12 hours Scheduled DuoNebs every 6 hours For now will initiate continuous oxygen at 2 L/min and once she begins to improve clinically can taper down to nocturnal only as per home regimen Patient has had increased edema of the legs which she attributes to being less active and sitting with her legs dependent.  She has no history of heart failure or pulmonary hypertension.  As a precaution I will check a BNP and additional workup can proceed thereafter. She has not had any chest pain and therefore I do not suspect PE as etiology to her respiratory symptoms.  If  any concerns can check lower extremity venous duplex.  Hypertension Continue preadmission thiazide diuretic  Microcytic anemia Continue supplemental iron as prior to admission Hemoglobin is somewhat lower than baseline of 9.9-10.9 which could reflect volume overload Workup as above  Dyslipidemia Continue statin    Advance Care Planning:   Code Status: Full Code   VTE prophylaxis: Lovenox  Consults: None  Family Communication: Patient only  Severity of Illness: The appropriate patient status for this patient is OBSERVATION. Observation status is judged to be reasonable and necessary in order to provide the required intensity of service to ensure the patient's safety. The patient's presenting symptoms, physical exam findings, and initial radiographic and laboratory data in the context of their medical condition is  felt to place them at decreased risk for further clinical deterioration. Furthermore, it is anticipated that the patient will be medically stable for discharge from the hospital within 2 midnights of admission.   Author: Junious Silk, NP 09/21/2023 4:39 PM  For on call review [email or web address].

## 2023-09-21 NOTE — ED Notes (Signed)
ED TO INPATIENT HANDOFF REPORT  ED Nurse Name and Phone #:  Mellody Dance  -  [phone]  S Name/Age/Gender Breanna Ramirez 67 y.o. female Room/Bed: WTR5/WTR5  Code Status   Code Status: Prior  Home/SNF/Other Home Patient oriented to: self, place, time, and situation Is this baseline? Yes   Triage Complete: Triage complete  Chief Complaint Acute on chronic hypoxic respiratory failure (HCC) [J96.21]  Triage Note Pt c/o SOB for about a week, chest and nasal congestion, and upper back pain for about a week. Pt uses oxygen to sleep. Pt has a dry cough that worsens when supine.   Allergies Allergies  Allergen Reactions   Codeine Nausea And Vomiting    Level of Care/Admitting Diagnosis ED Disposition     ED Disposition  Admit   Condition  --   Comment  Hospital Area: [facility] [[id number]]  Level of Care: Med-Surg [16]  [facility] patient in [facility] if equivalent level of care is available:: Yes  Covid Evaluation: Confirmed COVID Negative  Diagnosis: Acute on chronic hypoxic respiratory failure [facility]) [[id number]]  Admitting Physician: Lorin Glass [[id number]]  Attending Physician: Lorin Glass [[id number]]          B Medical/Surgery History Past Medical History:  Diagnosis Date   COPD (chronic obstructive pulmonary disease) (HCC)    Essential hypertension    GERD (gastroesophageal reflux disease)    Pneumonia 05/2017   Respiratory failure with hypoxia (HCC) 05/2017   Past Surgical History:  Procedure Laterality Date   ENDOMETRIAL ABLATION       A IV Location/Drains/Wounds Patient Lines/Drains/Airways Status     Active Line/Drains/Airways     Name Placement date Placement time Site Days   Peripheral IV 09/21/23 20 G Right Antecubital 09/21/23  1035  Antecubital  less than 1            Intake/Output Last 24 hours  Intake/Output Summary (Last 24 hours) at 09/21/2023 1605 Last data filed at 09/21/2023  1344 Gross per 24 hour  Intake 350 ml  Output --  Net 350 ml    Labs/Imaging Results for orders placed or performed during the hospital encounter of 09/21/23 (from the past 48 hour(s))  Resp panel by RT-PCR (RSV, Flu A&B, Covid) Anterior Nasal Swab     Status: None   Collection Time: 09/21/23 10:28 AM   Specimen: Anterior Nasal Swab  Result Value Ref Range   SARS Coronavirus 2 by RT PCR NEGATIVE NEGATIVE    Comment: (NOTE) SARS-CoV-2 target nucleic acids are NOT DETECTED.  The SARS-CoV-2 RNA is generally detectable in upper respiratory specimens during the acute phase of infection. The lowest concentration of SARS-CoV-2 viral copies this assay can detect is 138 copies/mL. A negative result does not preclude SARS-Cov-2 infection and should not be used as the sole basis for treatment or other patient management decisions. A negative result may occur with  improper specimen collection/handling, submission of specimen other than nasopharyngeal swab, presence of viral mutation(s) within the areas targeted by this assay, and inadequate number of viral copies(<138 copies/mL). A negative result must be combined with clinical observations, patient history, and epidemiological information. The expected result is Negative.  Fact Sheet for Patients:  BloggerCourse.com  Fact Sheet for Healthcare Providers:  SeriousBroker.it  This test is no t yet approved or cleared by the [place] FDA and  has been authorized for detection and/or diagnosis of SARS-CoV-2 by FDA under an Emergency Use Authorization (EUA). This  EUA will remain  in effect (meaning this test can be used) for the duration of the COVID-19 declaration under Section 564(b)(1) of the Act, 21 U.S.C.section 360bbb-3(b)(1), unless the authorization is terminated  or revoked sooner.       Influenza A by PCR NEGATIVE NEGATIVE   Influenza B by PCR NEGATIVE NEGATIVE    Comment:  (NOTE) The Xpert Xpress SARS-CoV-2/FLU/RSV plus assay is intended as an aid in the diagnosis of influenza from Nasopharyngeal swab specimens and should not be used as a sole basis for treatment. Nasal washings and aspirates are unacceptable for Xpert Xpress SARS-CoV-2/FLU/RSV testing.  Fact Sheet for Patients: BloggerCourse.com  Fact Sheet for Healthcare Providers: SeriousBroker.it  This test is not yet approved or cleared by the [place] FDA and has been authorized for detection and/or diagnosis of SARS-CoV-2 by FDA under an Emergency Use Authorization (EUA). This EUA will remain in effect (meaning this test can be used) for the duration of the COVID-19 declaration under Section 564(b)(1) of the Act, 21 U.S.C. section 360bbb-3(b)(1), unless the authorization is terminated or revoked.     Resp Syncytial Virus by PCR NEGATIVE NEGATIVE    Comment: (NOTE) Fact Sheet for Patients: BloggerCourse.com  Fact Sheet for Healthcare Providers: SeriousBroker.it  This test is not yet approved or cleared by the [place] FDA and has been authorized for detection and/or diagnosis of SARS-CoV-2 by FDA under an Emergency Use Authorization (EUA). This EUA will remain in effect (meaning this test can be used) for the duration of the COVID-19 declaration under Section 564(b)(1) of the Act, 21 U.S.C. section 360bbb-3(b)(1), unless the authorization is terminated or revoked.  Performed at [facility], [address], [place], [place] [address]   CBC with Differential     Status: Abnormal   Collection Time: 09/21/23 10:28 AM  Result Value Ref Range   WBC 14.3 (H) 4.0 - 10.5 K/uL   RBC 3.74 (L) 3.87 - 5.11 MIL/uL   Hemoglobin 8.1 (L) 12.0 - 15.0 g/dL    Comment: Reticulocyte Hemoglobin testing may be clinically indicated, consider ordering this additional test  [id number]    HCT 28.1 (L) 36.0 - 46.0 %   MCV 75.1 (L) 80.0 - 100.0 fL   MCH 21.7 (L) 26.0 - 34.0 pg   MCHC 28.8 (L) 30.0 - 36.0 g/dL   RDW 95.2 (H) 84.1 - 32.4 %   Platelets 418 (H) 150 - 400 K/uL   nRBC 0.0 0.0 - 0.2 %   Neutrophils Relative % 74 %   Neutro Abs 10.6 (H) 1.7 - 7.7 K/uL   Lymphocytes Relative 15 %   Lymphs Abs 2.2 0.7 - 4.0 K/uL   Monocytes Relative 9 %   Monocytes Absolute 1.3 (H) 0.1 - 1.0 K/uL   Eosinophils Relative 1 %   Eosinophils Absolute 0.2 0.0 - 0.5 K/uL   Basophils Relative 0 %   Basophils Absolute 0.1 0.0 - 0.1 K/uL   Immature Granulocytes 1 %   Abs Immature Granulocytes 0.09 (H) 0.00 - 0.07 K/uL   Blister Cells PRESENT    Schistocytes PRESENT    Polychromasia PRESENT    Ovalocytes PRESENT     Comment: Performed at [facility], [address] [place], [place] [address]  Comprehensive metabolic panel     Status: Abnormal   Collection Time: 09/21/23 10:28 AM  Result Value Ref Range   Sodium 137 135 - 145 mmol/L   Potassium 3.4 (L) 3.5 - 5.1 mmol/L  Chloride 98 98 - 111 mmol/L   CO2 26 22 - 32 mmol/L   Glucose, Bld 83 70 - 99 mg/dL    Comment: Glucose reference range applies only to samples taken after fasting for at least 8 hours.   BUN 19 8 - 23 mg/dL   Creatinine, Ser 9.60 0.44 - 1.00 mg/dL   Calcium 9.3 8.9 - 45.4 mg/dL   Total Protein 6.5 6.5 - 8.1 g/dL   Albumin 3.5 3.5 - 5.0 g/dL   AST 14 (L) 15 - 41 U/L   ALT 12 0 - 44 U/L   Alkaline Phosphatase 50 38 - 126 U/L   Total Bilirubin 0.7 0.3 - 1.2 mg/dL   GFR, Estimated >09 >81 mL/min    Comment: (NOTE) Calculated using the CKD-EPI Creatinine Equation (2021)    Anion gap 13 5 - 15    Comment: Performed at [facility], [address] [place], [place] [address]   DG Chest 2 View  Result Date: 09/21/2023 CLINICAL DATA:  Shortness of breath. EXAM: CHEST - 2 VIEW COMPARISON:  May 11, 2023. FINDINGS: The heart size and mediastinal contours are within  normal limits. Hyperexpansion of the lungs is again noted. Bilateral diffuse interstitial densities are noted which may represent chronic interstitial lung disease, but acute superimposed edema or atypical inflammation cannot be excluded. The visualized skeletal structures are unremarkable. IMPRESSION: Hyperexpansion of the lungs. Bilateral diffuse interstitial densities as described above. Aortic Atherosclerosis (ICD10-I70.0). Electronically Signed   By: Lupita Raider M.D.   On: 09/21/2023 11:33    Pending Labs Unresulted Labs (From admission, onward)     Start     Ordered   09/21/23 1549  Respiratory (~20 pathogens) panel by PCR  (Respiratory panel by PCR (~20 pathogens, ~24 hr TAT)  w precautions)  Once,   R       Comments: This has more viruses that the RT RESP PCR    09/21/23 1549            Vitals/Pain Today's Vitals   09/21/23 0909 09/21/23 0913 09/21/23 1415  BP: 136/67  (!) 110/59  Pulse: (!) 105  73  Resp: 20  18  Temp: 98.8 F (37.1 C)  97.8 F (36.6 C)  TempSrc: Oral  Oral  SpO2: 95%  93%  Weight:  59 kg   Height:  5\' 5"  (1.651 m)   PainSc:  5      Isolation Precautions Droplet precaution  Medications Medications  azithromycin (ZITHROMAX) 500 mg in sodium chloride 0.9 % 250 mL IVPB (0 mg Intravenous Stopped 09/21/23 1344)  methylPREDNISolone sodium succinate (SOLU-MEDROL) 40 mg/mL injection 40 mg (has no administration in time range)  budesonide (PULMICORT) nebulizer solution 0.5 mg (has no administration in time range)  ipratropium-albuterol (DUONEB) 0.5-2.5 (3) MG/3ML nebulizer solution 3 mL (has no administration in time range)  cefTRIAXone (ROCEPHIN) 1 g in sodium chloride 0.9 % 100 mL IVPB (0 g Intravenous Stopped 09/21/23 1315)    Mobility walks     Focused Assessments    R Recommendations: See Admitting Provider Note  Report given to:   Additional Notes:

## 2023-09-22 ENCOUNTER — Observation Stay (HOSPITAL_BASED_OUTPATIENT_CLINIC_OR_DEPARTMENT_OTHER): Payer: BC Managed Care – PPO

## 2023-09-22 DIAGNOSIS — R051 Acute cough: Secondary | ICD-10-CM | POA: Diagnosis present

## 2023-09-22 DIAGNOSIS — Z9981 Dependence on supplemental oxygen: Secondary | ICD-10-CM | POA: Diagnosis not present

## 2023-09-22 DIAGNOSIS — R0609 Other forms of dyspnea: Secondary | ICD-10-CM | POA: Diagnosis not present

## 2023-09-22 DIAGNOSIS — I110 Hypertensive heart disease with heart failure: Secondary | ICD-10-CM | POA: Diagnosis present

## 2023-09-22 DIAGNOSIS — Z885 Allergy status to narcotic agent status: Secondary | ICD-10-CM | POA: Diagnosis not present

## 2023-09-22 DIAGNOSIS — Z87891 Personal history of nicotine dependence: Secondary | ICD-10-CM | POA: Diagnosis not present

## 2023-09-22 DIAGNOSIS — I5032 Chronic diastolic (congestive) heart failure: Secondary | ICD-10-CM | POA: Diagnosis present

## 2023-09-22 DIAGNOSIS — E785 Hyperlipidemia, unspecified: Secondary | ICD-10-CM | POA: Diagnosis present

## 2023-09-22 DIAGNOSIS — R9431 Abnormal electrocardiogram [ECG] [EKG]: Secondary | ICD-10-CM

## 2023-09-22 DIAGNOSIS — Z1152 Encounter for screening for COVID-19: Secondary | ICD-10-CM | POA: Diagnosis not present

## 2023-09-22 DIAGNOSIS — J439 Emphysema, unspecified: Secondary | ICD-10-CM | POA: Diagnosis present

## 2023-09-22 DIAGNOSIS — M7989 Other specified soft tissue disorders: Secondary | ICD-10-CM | POA: Diagnosis not present

## 2023-09-22 DIAGNOSIS — Z7951 Long term (current) use of inhaled steroids: Secondary | ICD-10-CM | POA: Diagnosis not present

## 2023-09-22 DIAGNOSIS — J441 Chronic obstructive pulmonary disease with (acute) exacerbation: Secondary | ICD-10-CM | POA: Diagnosis present

## 2023-09-22 DIAGNOSIS — J440 Chronic obstructive pulmonary disease with acute lower respiratory infection: Secondary | ICD-10-CM | POA: Diagnosis present

## 2023-09-22 DIAGNOSIS — Z79899 Other long term (current) drug therapy: Secondary | ICD-10-CM | POA: Diagnosis not present

## 2023-09-22 DIAGNOSIS — J9621 Acute and chronic respiratory failure with hypoxia: Secondary | ICD-10-CM | POA: Diagnosis present

## 2023-09-22 DIAGNOSIS — J189 Pneumonia, unspecified organism: Secondary | ICD-10-CM | POA: Diagnosis present

## 2023-09-22 DIAGNOSIS — D509 Iron deficiency anemia, unspecified: Secondary | ICD-10-CM | POA: Diagnosis present

## 2023-09-22 DIAGNOSIS — K219 Gastro-esophageal reflux disease without esophagitis: Secondary | ICD-10-CM | POA: Diagnosis present

## 2023-09-22 DIAGNOSIS — J8410 Pulmonary fibrosis, unspecified: Secondary | ICD-10-CM | POA: Diagnosis present

## 2023-09-22 LAB — RESPIRATORY PANEL BY PCR

## 2023-09-22 LAB — ECHOCARDIOGRAM COMPLETE
Area-P 1/2: 2.87 cm2
Calc EF: 66.3 %
Height: 65 in
MV VTI: 1.74 cm2
S' Lateral: 2.7 cm
Single Plane A2C EF: 64.3 %
Single Plane A4C EF: 72.3 %
Weight: 2052.92 oz

## 2023-09-22 LAB — COMPREHENSIVE METABOLIC PANEL
ALT: 13 U/L (ref 0–44)
AST: 16 U/L (ref 15–41)
Albumin: 2.9 g/dL — ABNORMAL LOW (ref 3.5–5.0)
Alkaline Phosphatase: 46 U/L (ref 38–126)
Anion gap: 9 (ref 5–15)
BUN: 14 mg/dL (ref 8–23)
CO2: 27 mmol/L (ref 22–32)
Calcium: 8.9 mg/dL (ref 8.9–10.3)
Chloride: 101 mmol/L (ref 98–111)
Creatinine, Ser: 0.67 mg/dL (ref 0.44–1.00)
GFR, Estimated: >60 mL/min (ref >60)
Glucose, Bld: 109 mg/dL — ABNORMAL HIGH (ref 70–99)
Potassium: 4.7 mmol/L (ref 3.5–5.1)
Sodium: 137 mmol/L (ref 135–145)
Total Bilirubin: 0.5 mg/dL (ref 0.3–1.2)
Total Protein: 5.6 g/dL — ABNORMAL LOW (ref 6.5–8.1)

## 2023-09-22 LAB — CBC
HCT: 27.1 % — ABNORMAL LOW (ref 36.0–46.0)
Hemoglobin: 7.4 g/dL — ABNORMAL LOW (ref 12.0–15.0)
MCH: 21.6 pg — ABNORMAL LOW (ref 26.0–34.0)
MCHC: 27.3 g/dL — ABNORMAL LOW (ref 30.0–36.0)
MCV: 79.2 fL — ABNORMAL LOW (ref 80.0–100.0)
Platelets: 362 10*3/uL (ref 150–400)
RBC: 3.42 MIL/uL — ABNORMAL LOW (ref 3.87–5.11)
RDW: 24.3 % — ABNORMAL HIGH (ref 11.5–15.5)
WBC: 7.3 10*3/uL (ref 4.0–10.5)
nRBC: 0 % (ref 0.0–0.2)

## 2023-09-22 MED ORDER — DOXYCYCLINE HYCLATE 100 MG PO TABS
100.0000 mg | ORAL_TABLET | Freq: Two times a day (BID) | ORAL | Status: DC
  Administered 2023-09-22 – 2023-09-24 (×4): 100 mg via ORAL
  Filled 2023-09-22 (×4): qty 1

## 2023-09-22 MED ORDER — FUROSEMIDE 10 MG/ML IJ SOLN
20.0000 mg | Freq: Once | INTRAMUSCULAR | Status: DC
Start: 2023-09-22 — End: 2023-09-22

## 2023-09-22 MED ORDER — IPRATROPIUM-ALBUTEROL 0.5-2.5 (3) MG/3ML IN SOLN
3.0000 mL | Freq: Three times a day (TID) | RESPIRATORY_TRACT | Status: DC
  Administered 2023-09-22 – 2023-09-24 (×6): 3 mL via RESPIRATORY_TRACT
  Filled 2023-09-22 (×6): qty 3

## 2023-09-22 MED ORDER — FUROSEMIDE 10 MG/ML IJ SOLN
40.0000 mg | Freq: Once | INTRAMUSCULAR | Status: AC
  Administered 2023-09-22: 40 mg via INTRAVENOUS
  Filled 2023-09-22: qty 4

## 2023-09-22 MED ORDER — FLUTICASONE PROPIONATE 50 MCG/ACT NA SUSP
1.0000 | Freq: Two times a day (BID) | NASAL | Status: DC
  Administered 2023-09-22 – 2023-09-24 (×4): 1 via NASAL
  Filled 2023-09-22: qty 16

## 2023-09-22 MED ORDER — UMECLIDINIUM BROMIDE 62.5 MCG/ACT IN AEPB
1.0000 | INHALATION_SPRAY | Freq: Every day | RESPIRATORY_TRACT | Status: DC
  Administered 2023-09-23 – 2023-09-24 (×2): 1 via RESPIRATORY_TRACT
  Filled 2023-09-22: qty 7

## 2023-09-22 MED ORDER — SODIUM CHLORIDE 0.9 % IV SOLN
250.0000 mg | Freq: Once | INTRAVENOUS | Status: AC
  Administered 2023-09-22: 250 mg via INTRAVENOUS
  Filled 2023-09-22: qty 20

## 2023-09-22 MED ORDER — FERROUS SULFATE 325 (65 FE) MG PO TABS
325.0000 mg | ORAL_TABLET | Freq: Every day | ORAL | Status: DC
  Administered 2023-09-23 – 2023-09-24 (×2): 325 mg via ORAL
  Filled 2023-09-22 (×2): qty 1

## 2023-09-22 MED ORDER — ALBUTEROL SULFATE (2.5 MG/3ML) 0.083% IN NEBU: 2.5000 mg | INHALATION_SOLUTION | Freq: Four times a day (QID) | RESPIRATORY_TRACT | Status: DC | PRN

## 2023-09-22 MED ORDER — PREDNISONE 10 MG PO TABS: 10.0000 mg | ORAL_TABLET | Freq: Every day | ORAL | Status: DC

## 2023-09-22 MED ORDER — PREDNISONE 20 MG PO TABS
40.0000 mg | ORAL_TABLET | Freq: Every day | ORAL | Status: DC
  Administered 2023-09-23 – 2023-09-24 (×2): 40 mg via ORAL
  Filled 2023-09-22 (×2): qty 2

## 2023-09-22 MED ORDER — FLUTICASONE FUROATE-VILANTEROL 200-25 MCG/ACT IN AEPB
1.0000 | INHALATION_SPRAY | Freq: Every day | RESPIRATORY_TRACT | Status: DC
  Administered 2023-09-23 – 2023-09-24 (×2): 1 via RESPIRATORY_TRACT
  Filled 2023-09-22: qty 28

## 2023-09-22 NOTE — Progress Notes (Signed)
  Echocardiogram 2D Echocardiogram has been performed.  Breanna Ramirez 09/22/2023, 11:29 AM

## 2023-09-22 NOTE — Plan of Care (Signed)
  Problem: Education: Goal: Knowledge of General Education information will improve Description: Including pain rating scale, medication(s)/side effects and non-pharmacologic comfort measures Outcome: Progressing   Problem: Clinical Measurements: Goal: Will remain free from infection Outcome: Progressing Goal: Respiratory complications will improve Outcome: Progressing Goal: Cardiovascular complication will be avoided Outcome: Progressing   Problem: Nutrition: Goal: Adequate nutrition will be maintained Outcome: Progressing   Problem: Elimination: Goal: Will not experience complications related to bowel motility Outcome: Progressing   Problem: Pain Managment: Goal: General experience of comfort will improve Outcome: Progressing   Problem: Safety: Goal: Ability to remain free from injury will improve Outcome: Progressing

## 2023-09-22 NOTE — Progress Notes (Signed)
Mobility Specialist - Progress Note   09/22/23 1007  Oxygen Therapy  SpO2 90 %  O2 Device Nasal Cannula  O2 Flow Rate (L/min) 2.5 L/min  Patient Activity (if Appropriate) Ambulating  Mobility  Activity Ambulated independently in hallway  Level of Assistance Independent  Assistive Device None  Distance Ambulated (ft) 500 ft  Activity Response Tolerated well  Mobility Referral Yes  $Mobility charge 1 Mobility  Mobility Specialist Start Time (ACUTE ONLY) [id number]  Mobility Specialist Stop Time (ACUTE ONLY) 1005  Mobility Specialist Time Calculation (min) (ACUTE ONLY) 13 min   Pt received in bed and agreeable to mobility. No complaints during session. Pt to bed after session with all needs met.    Pre-mobility: 91% SpO2 During mobility: 127 HR, 90% SpO2 Post-mobility: 115 HR, 94%SPO2  Chief Technology Officer

## 2023-09-22 NOTE — Plan of Care (Signed)

## 2023-09-22 NOTE — Progress Notes (Addendum)
PROGRESS NOTE  Breanna Ramirez  DOB: 1956-08-26  PCP: Salli Real, MD [id number]  DOA: 09/21/2023  LOS: 0 days  Hospital Day: 2  Brief narrative: Breanna Ramirez is a 67 y.o. female with PMH significant for COPD on nocturnal oxygen, COPD, HTN, HLD, anemia. For the last few weeks patient has been increasingly short of breath.  She recently completed a course of Levaquin for suspected pneumonia.  However, continue to have symptoms and hence presented to the ED on 9/23.  In the ED, hemodynamically stable, breathing on room air. Labs with WBC count of 14.3, hemoglobin 8.1 CT chest showed emphysema and a new focal area of consolidation in the posterior left upper lobe of possible infectious/inflammatory etiology.  No acute findings otherwise.  She was suspected to have pneumonia.  Started on antibiotics. Admitted to [facility]  Subjective: Patient was seen and examined this morning.  Pleasant elderly Caucasian female.  Lying in bed.  Not in distress. No new symptoms overnight. Was getting echocardiogram done  Assessment and plan: Progressive dyspnea Chronic hypoxic respiratory failure on nocturnal oxygen  Patient endorses progressive worsening dyspnea especially on exertion.   Contributing factors : COPD exacerbation, pneumonia, CHF exertion, anemia. not requiring supplemental oxygen at rest.   Check ambulatory oxygen requirement.  Acute COPD exacerbation Left upper lobe pneumonia Presented with progressively worsening dyspnea for several weeks.  Recently treated as an outpatient for pneumonia with Levaquin. In the ED, patient was wheezing and was started on IV steroids. WBC count was mildly elevated.  But no fever.  Procalcitonin was not elevated CT chest findings as above with new focal area of consolidation in the posterior LUL It is unclear at this time if patient truly has bacterial pneumonia.  However given history of emphysema, severity of symptoms, elevated WBC count and imaging  finding of infiltrates, I would start on 5-day course of doxycycline. Wheezing is improved.  Switch to oral prednisone for tapering course. WBC count normalized today.  Monitor off antibiotics Continue bronchodilators Recent Labs  Lab 09/21/23 1028 09/21/23 1810 09/22/23 0526  WBC 14.3* 11.9* 7.3  PROCALCITON  --  <0.10  --    Acute exacerbation of chronic diastolic CHF  Essential hypertension bilateral pedal edema Obtain ultrasound duplex scan to rule out DVT Echocardiogram today showed EF 70 to 75%, G1DD Will give 1 dose of Lasix 40 mg IV today which could help with edema as well as dyspnea. PTA meds- HCTZ 25 mg daily Given IV Lasix today.  Continue monitor blood pressure.  Acute on chronic anemia Severe iron deficiency H/o GI bleeding Patient reports getting endoscopy done by Dr. Loreta Ave several years ago.  Hemoglobin at baseline close to 9.   No evidence of bleeding but hemoglobin down to 7.4 today.  Ferritin low at 12 IV iron replacement ordered for today. Anemia may be contributing to dyspnea. May need EGD/colonoscopy.  GI Dr. Elnoria Howard has been consulted today. Continue PPI Recent Labs    05/12/23 0441 05/12/23 1002 09/21/23 1028 09/21/23 1810 09/22/23 0526  HGB 9.3* 9.9* 8.1* 9.1* 7.4*  MCV 85.1 86.9 75.1* 78.0* 79.2*  VITAMINB12  --   --   --  386  --   FOLATE  --   --   --  24.1  --   FERRITIN  --   --   --  12  --   TIBC  --   --   --  531*  --   IRON  --   --   --  24*  --   RETICCTPCT  --   --   --  4.4*  --     Dyslipidemia Continue simvastatin    Mobility: Encourage ambulation.  Check ambulatory oxygen requirement  Goals of care   Code Status: Full Code     DVT prophylaxis:  enoxaparin (LOVENOX) injection 40 mg Start: 09/21/23 2200   Antimicrobials: Doxycycline Fluid: None Consultants: None Family Communication: None at bedside  Status: Patient Level of care:  Med-Surg   Patient is from: Home Needs to continue in-hospital care: Pending GI  workup Anticipated d/c to: Home ultimately hopefully in 2 to 3 days      Diet:  Diet Order             Diet regular Fluid consistency: Thin  Diet effective now                   Scheduled Meds:  doxycycline  100 mg Oral Q12H   enoxaparin (LOVENOX) injection  40 mg Subcutaneous QHS   [START ON 09/23/2023] ferrous sulfate  325 mg Oral Q breakfast   fluticasone  1 spray Each Nare BID   fluticasone furoate-vilanterol  1 puff Inhalation Daily   And   umeclidinium bromide  1 puff Inhalation Daily   furosemide  40 mg Intravenous Once   ipratropium-albuterol  3 mL Nebulization TID   montelukast  10 mg Oral QHS   pantoprazole  40 mg Oral Daily   [START ON 09/23/2023] predniSONE  40 mg Oral Q breakfast   simvastatin  40 mg Oral q1800   sodium chloride flush  3 mL Intravenous Q12H    PRN meds: acetaminophen **OR** acetaminophen, albuterol, ondansetron **OR** ondansetron (ZOFRAN) IV   Infusions:    Antimicrobials: Anti-infectives (From admission, onward)    Start     Dose/Rate Route Frequency Ordered Stop   09/22/23 2200  doxycycline (VIBRA-TABS) tablet 100 mg        100 mg Oral Every 12 hours 09/22/23 1558     09/21/23 1215  cefTRIAXone (ROCEPHIN) 1 g in sodium chloride 0.9 % 100 mL IVPB        1 g 200 mL/hr over 30 Minutes Intravenous  Once 09/21/23 1212 09/21/23 1315   09/21/23 1215  azithromycin (ZITHROMAX) 500 mg in sodium chloride 0.9 % 250 mL IVPB  Status:  Discontinued        500 mg 250 mL/hr over 60 Minutes Intravenous Every 24 hours 09/21/23 1212 09/22/23 0954       Objective: Vitals:   09/22/23 1007 09/22/23 1403  BP:  (!) 107/58  Pulse:  97  Resp:    Temp:  (!) 97.5 F (36.4 C)  SpO2: 90% 98%    Intake/Output Summary (Last 24 hours) at 09/22/2023 1618 Last data filed at 09/21/2023 1729 Gross per 24 hour  Intake 240 ml  Output --  Net 240 ml   Filed Weights   09/21/23 0913 09/21/23 1656 09/22/23 0500  Weight: 59 kg 60.6 kg 58.2 kg   Weight  change:  Body mass index is 21.35 kg/m.   Physical Exam: General exam: Pleasant, elderly Caucasian female.  Not in distress at rest Skin: No rashes, lesions or ulcers. HEENT: Atraumatic, normocephalic, no obvious bleeding Lungs: No crackles or wheezing today CVS: Regular rate and rhythm, no murmur GI/Abd soft, nontender, nondistended, bowel sound present CNS: Alert, awake and oriented x 3 Psychiatry: Mood appropriate Extremities: Trace to 1+ bilateral pedal edema, no calf tenderness  Data Review:  I have personally reviewed the laboratory data and studies available.  F/u labs ordered Unresulted Labs (From admission, onward)     Start     Ordered   09/28/23 0500  Creatinine, serum  (enoxaparin (LOVENOX)    CrCl >/= 30 ml/min)  Weekly,   R     Comments: while on enoxaparin therapy    09/21/23 1638   09/23/23 0500  Basic metabolic panel  Tomorrow morning,   R        09/22/23 1601   09/23/23 0500  CBC with Differential/Platelet  Tomorrow morning,   R        09/22/23 1601   09/22/23 0955  Occult blood card to lab, stool  Once,   R        09/22/23 0954            Total time spent in review of labs and imaging, patient evaluation, formulation of plan, documentation and communication with family: 55 minutes  Signed, Lorin Glass, MD [facility] 09/22/2023

## 2023-09-22 NOTE — Progress Notes (Addendum)
Resp panel sent to lab. Pt placed on droplet precautions. PT has productive cough. Resp even and unlabored. 2 L O2 [place] currently on. Pt is not in acute distress at this time.  0100 Rounding complete. Pt denies any needs at this time. NAD noted.

## 2023-09-22 NOTE — Consult Note (Signed)
Reason for Consult: Anemia Referring Physician: [facility]  Breanna Ramirez HPI: This is a 67 year old female with a PMH of pulmonary fibrosis on nightly oxygen, HTN, IDA, small bowel AVMs, and hyperlipidemia admitted for complaints of DOE.  Over the past week she started to experience SOB with exertion and an associated cough.  As an outpatient she was prescribed Levaquin, but there was no improvement in her symptoms.  She presented to the ER for her SOB and further work up did not show any new pulmonary issues, but she was identified to have an anemia.  Her baseline HGB was around 9-10 g/dL, but her HGB declined down to 7.4 g/dL with IV hydration.  She denies any issues with hematochezia, melena, abdominal pain, hematemesis, hemoptysis, or hematuria.  The patient has a long history of IDA.  In 2009 Dr. Loreta Ave performed an EGD/colonoscopy and VCE, as an outpatient, and she identified small bowel AVMs.  Her HGB remained relatively stable and normal in the 12 g/dL range until 1610.  At that time her HGB was at 9.2 g/dL.  She was found to have heme positive stool at that time, but it was not clear if she had any repeat work up at that time.  The patient does report using ibuprofen on a chronic basis for years, which predated her current anemia.  Past Medical History:  Diagnosis Date   COPD (chronic obstructive pulmonary disease) (HCC)    Essential hypertension    GERD (gastroesophageal reflux disease)    Pneumonia 05/2017   Respiratory failure with hypoxia (HCC) 05/2017    Past Surgical History:  Procedure Laterality Date   ENDOMETRIAL ABLATION      Family History  Problem Relation Age of Onset   Cancer Father     Social History:  reports that she quit smoking about 5 years ago. Her smoking use included cigarettes. She started smoking about 50 years ago. She has a 45 pack-year smoking history. She has never used smokeless tobacco. She reports that she does not drink alcohol and does not  use drugs.  Allergies:  Allergies  Allergen Reactions   Codeine Nausea And Vomiting    Medications: Scheduled:  doxycycline  100 mg Oral Q12H   enoxaparin (LOVENOX) injection  40 mg Subcutaneous QHS   [START ON 09/23/2023] ferrous sulfate  325 mg Oral Q breakfast   fluticasone  1 spray Each Nare BID   [START ON 09/23/2023] fluticasone furoate-vilanterol  1 puff Inhalation Daily   And   [START ON 09/23/2023] umeclidinium bromide  1 puff Inhalation Daily   furosemide  40 mg Intravenous Once   ipratropium-albuterol  3 mL Nebulization TID   montelukast  10 mg Oral QHS   pantoprazole  40 mg Oral Daily   [START ON 09/23/2023] predniSONE  40 mg Oral Q breakfast   simvastatin  40 mg Oral q1800   sodium chloride flush  3 mL Intravenous Q12H   Continuous:  ferric gluconate (FERRLECIT) IVPB      Results for orders placed or performed during the hospital encounter of 09/21/23 (from the past 24 hour(s))  Brain natriuretic peptide     Status: None   Collection Time: 09/21/23  6:10 PM  Result Value Ref Range   B Natriuretic Peptide 76.2 0.0 - 100.0 pg/mL  HIV Antibody (routine testing w rflx)     Status: None   Collection Time: 09/21/23  6:10 PM  Result Value Ref Range   HIV Screen 4th Generation wRfx  Non Reactive Non Reactive  CBC     Status: Abnormal   Collection Time: 09/21/23  6:10 PM  Result Value Ref Range   WBC 11.9 (H) 4.0 - 10.5 K/uL   RBC 4.19 3.87 - 5.11 MIL/uL   Hemoglobin 9.1 (L) 12.0 - 15.0 g/dL   HCT 95.6 (L) 38.7 - 56.4 %   MCV 78.0 (L) 80.0 - 100.0 fL   MCH 21.7 (L) 26.0 - 34.0 pg   MCHC 27.8 (L) 30.0 - 36.0 g/dL   RDW 33.2 (H) 95.1 - 88.4 %   Platelets 493 (H) 150 - 400 K/uL   nRBC 0.0 0.0 - 0.2 %  Creatinine, serum     Status: None   Collection Time: 09/21/23  6:10 PM  Result Value Ref Range   Creatinine, Ser 0.89 0.44 - 1.00 mg/dL   GFR, Estimated >16 >60 mL/min  Vitamin B12     Status: None   Collection Time: 09/21/23  6:10 PM  Result Value Ref Range    Vitamin B-12 386 180 - 914 pg/mL  Folate     Status: None   Collection Time: 09/21/23  6:10 PM  Result Value Ref Range   Folate 24.1 >5.9 ng/mL  Iron and TIBC     Status: Abnormal   Collection Time: 09/21/23  6:10 PM  Result Value Ref Range   Iron 24 (L) 28 - 170 ug/dL   TIBC 630 (H) 160 - 109 ug/dL   Saturation Ratios 5 (L) 10.4 - 31.8 %   UIBC 507 ug/dL  Ferritin     Status: None   Collection Time: 09/21/23  6:10 PM  Result Value Ref Range   Ferritin 12 11 - 307 ng/mL  Reticulocytes     Status: Abnormal   Collection Time: 09/21/23  6:10 PM  Result Value Ref Range   Retic Ct Pct 4.4 (H) 0.4 - 3.1 %   RBC. 4.12 3.87 - 5.11 MIL/uL   Retic Count, Absolute 181.3 19.0 - 186.0 K/uL   Immature Retic Fract 28.4 (H) 2.3 - 15.9 %  Procalcitonin     Status: None   Collection Time: 09/21/23  6:10 PM  Result Value Ref Range   Procalcitonin <0.10 ng/mL  Comprehensive metabolic panel     Status: Abnormal   Collection Time: 09/22/23  5:26 AM  Result Value Ref Range   Sodium 137 135 - 145 mmol/L   Potassium 4.7 3.5 - 5.1 mmol/L   Chloride 101 98 - 111 mmol/L   CO2 27 22 - 32 mmol/L   Glucose, Bld 109 (H) 70 - 99 mg/dL   BUN 14 8 - 23 mg/dL   Creatinine, Ser 3.23 0.44 - 1.00 mg/dL   Calcium 8.9 8.9 - 55.7 mg/dL   Total Protein 5.6 (L) 6.5 - 8.1 g/dL   Albumin 2.9 (L) 3.5 - 5.0 g/dL   AST 16 15 - 41 U/L   ALT 13 0 - 44 U/L   Alkaline Phosphatase 46 38 - 126 U/L   Total Bilirubin 0.5 0.3 - 1.2 mg/dL   GFR, Estimated >32 >20 mL/min   Anion gap 9 5 - 15  CBC     Status: Abnormal   Collection Time: 09/22/23  5:26 AM  Result Value Ref Range   WBC 7.3 4.0 - 10.5 K/uL   RBC 3.42 (L) 3.87 - 5.11 MIL/uL   Hemoglobin 7.4 (L) 12.0 - 15.0 g/dL   HCT 25.4 (L) 27.0 - 62.3 %   MCV 79.2 (  L) 80.0 - 100.0 fL   MCH 21.6 (L) 26.0 - 34.0 pg   MCHC 27.3 (L) 30.0 - 36.0 g/dL   RDW 62.1 (H) 30.8 - 65.7 %   Platelets 362 150 - 400 K/uL   nRBC 0.0 0.0 - 0.2 %     ECHOCARDIOGRAM COMPLETE  Result  Date: 09/22/2023    ECHOCARDIOGRAM REPORT   Patient Name:   Breanna Ramirez Date of Exam: 09/22/2023 Medical Rec #:  [id number]       Height:       65.0 in Accession #:    [id number]      Weight:       128.3 lb Date of Birth:  1956/06/19      BSA:          1.638 m Patient Age:    42 years        BP:           108/56 mmHg Patient Gender: F               HR:           76 bpm. Exam Location:  Inpatient Procedure: 2D Echo, Cardiac Doppler and Color Doppler Indications:    R94.31 Abnormal EKG  History:        Patient has no prior history of Echocardiogram examinations.                 COPD, Signs/Symptoms:Shortness of Breath and Dyspnea; Risk                 Factors:Dyslipidemia and Hypertension. Pulmonary fibrosis.                 Hypoxia.  Sonographer:    Sheralyn Boatman RDCS Referring Phys: [id number] [facility] IMPRESSIONS  1. Left ventricular ejection fraction, by estimation, is 70 to 75%. The left ventricle has hyperdynamic function. The left ventricle has no regional wall motion abnormalities. There is mild left ventricular hypertrophy of the basal-septal segment. Left ventricular diastolic parameters are consistent with Grade I diastolic dysfunction (impaired relaxation).  2. Right ventricular systolic function is hyperdynamic. The right ventricular size is normal. Tricuspid regurgitation signal is inadequate for assessing PA pressure.  3. The mitral valve is degenerative. Trivial mitral valve regurgitation. Mild mitral stenosis.  4. The aortic valve is tricuspid. There is mild calcification of the aortic valve. Aortic valve regurgitation is not visualized. Aortic valve sclerosis is present, with no evidence of aortic valve stenosis.  5. The inferior vena cava is dilated in size with <50% respiratory variability, suggesting right atrial pressure of 15 mmHg. Comparison(s): No prior Echocardiogram. FINDINGS  Left Ventricle: Left ventricular ejection fraction, by estimation, is 70 to 75%. The left ventricle has hyperdynamic  function. The left ventricle has no regional wall motion abnormalities. The left ventricular internal cavity size was normal in size. There is mild left ventricular hypertrophy of the basal-septal segment. Left ventricular diastolic parameters are consistent with Grade I diastolic dysfunction (impaired relaxation). Right Ventricle: The right ventricular size is normal. No increase in right ventricular wall thickness. Right ventricular systolic function is hyperdynamic. Tricuspid regurgitation signal is inadequate for assessing PA pressure. Left Atrium: Left atrial size was normal in size. Right Atrium: Right atrial size was normal in size. Pericardium: There is no evidence of pericardial effusion. Mitral Valve: The mitral valve is degenerative in appearance. Trivial mitral valve regurgitation. Mild mitral valve stenosis. MV peak gradient, 11.3 mmHg. The mean mitral valve gradient is 5.0  mmHg. Tricuspid Valve: The tricuspid valve is normal in structure. Tricuspid valve regurgitation is trivial. No evidence of tricuspid stenosis. Aortic Valve: The aortic valve is tricuspid. There is mild calcification of the aortic valve. There is mild aortic valve annular calcification. Aortic valve regurgitation is not visualized. Aortic valve sclerosis is present, with no evidence of aortic valve stenosis. Pulmonic Valve: The pulmonic valve was normal in structure. Pulmonic valve regurgitation is not visualized. No evidence of pulmonic stenosis. Aorta: The aortic root, ascending aorta and aortic arch are all structurally normal, with no evidence of dilitation or obstruction. Venous: The inferior vena cava is dilated in size with less than 50% respiratory variability, suggesting right atrial pressure of 15 mmHg. IAS/Shunts: The interatrial septum appears to be lipomatous. No atrial level shunt detected by color flow Doppler.  LEFT VENTRICLE PLAX 2D LVIDd:         4.20 cm     Diastology LVIDs:         2.70 cm     LV e' medial:     8.92 cm/s LV PW:         1.00 cm     LV E/e' medial:  15.9 LV IVS:        0.80 cm     LV e' lateral:   7.94 cm/s LVOT diam:     2.00 cm     LV E/e' lateral: 17.9 LV SV:         87 LV SV Index:   53 LVOT Area:     3.14 cm  LV Volumes (MOD) LV vol d, MOD A2C: 67.8 ml LV vol d, MOD A4C: 78.8 ml LV vol s, MOD A2C: 24.2 ml LV vol s, MOD A4C: 21.8 ml LV SV MOD A2C:     43.6 ml LV SV MOD A4C:     78.8 ml LV SV MOD BP:      49.3 ml RIGHT VENTRICLE             IVC RV S prime:     12.50 cm/s  IVC diam: 2.50 cm TAPSE (M-mode): 2.4 cm LEFT ATRIUM             Index        RIGHT ATRIUM          Index LA diam:        2.70 cm 1.65 cm/m   RA Area:     9.52 cm LA Vol (A2C):   37.7 ml 23.02 ml/m  RA Volume:   17.60 ml 10.74 ml/m LA Vol (A4C):   23.4 ml 14.29 ml/m LA Biplane Vol: 30.3 ml 18.50 ml/m  AORTIC VALVE LVOT Vmax:   127.00 cm/s LVOT Vmean:  87.200 cm/s LVOT VTI:    0.276 m  AORTA Ao Root diam: 2.90 cm Ao Asc diam:  3.00 cm MITRAL VALVE MV Area (PHT): 2.87 cm     SHUNTS MV Area VTI:   1.74 cm     Systemic VTI:  0.28 m MV Peak grad:  11.3 mmHg    Systemic Diam: 2.00 cm MV Mean grad:  5.0 mmHg MV Vmax:       1.68 m/s MV Vmean:      100.0 cm/s MV Decel Time: 264 msec MV E velocity: 142.00 cm/s MV A velocity: 170.00 cm/s MV E/A ratio:  0.84 Breanna Lam MD Electronically signed by Breanna Lam MD Signature Date/Time: 09/22/2023/12:22:27 PM    Final    CT Chest Wo Contrast  Result Date: 09/21/2023 CLINICAL DATA:  Pneumonia. Shortness of breath for a week with nasal and chest congestion. Upper back pain. Cough. EXAM: CT CHEST WITHOUT CONTRAST TECHNIQUE: Multidetector CT imaging of the chest was performed following the standard protocol without IV contrast. RADIATION DOSE REDUCTION: This exam was performed according to the departmental dose-optimization program which includes automated exposure control, adjustment of the mA and/or kV according to patient size and/or use of iterative reconstruction  technique. COMPARISON:  12/15/2022. FINDINGS: Cardiovascular: Atherosclerotic calcification of the aorta and aortic valve with age advanced involvement of all 3 coronary arteries. Heart is enlarged. No pericardial effusion. Mediastinum/Nodes: No pathologically enlarged mediastinal or axillary lymph nodes. Hilar regions are difficult to definitively evaluate without IV contrast. Esophagus is grossly unremarkable. Lungs/Pleura: Centrilobular emphysema. Scattered pleuroparenchymal scarring, most notable in the right middle lobe and lingula. Additional subsegmental volume loss in both lower lobes. No dense airspace consolidation. New focal nodular airspace consolidation in the posterior left upper lobe (4/49). No pleural fluid. Airway is unremarkable. Upper Abdomen: Visualized portions of the liver, gallbladder, adrenal glands, kidneys, spleen, pancreas, stomach and bowel are grossly unremarkable. No upper abdominal adenopathy. Musculoskeletal: Mild degenerative changes in the spine. No worrisome lytic or sclerotic lesions. IMPRESSION: 1. New focal area of nodular consolidation in the posterior left upper lobe (4/49), possibly infectious/inflammatory in etiology. Recommend follow-up CT chest without contrast in 3 months as malignancy cannot be excluded. 2. Otherwise, no acute findings. 3. Age advanced involvement all 3 coronary arteries. 4.  Aortic atherosclerosis (ICD10-I70.0). 5.  Emphysema (ICD10-J43.9). Electronically Signed   By: Leanna Battles M.D.   On: 09/21/2023 16:35   DG Chest 2 View  Result Date: 09/21/2023 CLINICAL DATA:  Shortness of breath. EXAM: CHEST - 2 VIEW COMPARISON:  May 11, 2023. FINDINGS: The heart size and mediastinal contours are within normal limits. Hyperexpansion of the lungs is again noted. Bilateral diffuse interstitial densities are noted which may represent chronic interstitial lung disease, but acute superimposed edema or atypical inflammation cannot be excluded. The visualized  skeletal structures are unremarkable. IMPRESSION: Hyperexpansion of the lungs. Bilateral diffuse interstitial densities as described above. Aortic Atherosclerosis (ICD10-I70.0). Electronically Signed   By: Lupita Raider M.D.   On: 09/21/2023 11:33    ROS:  As stated above in the HPI otherwise negative.  Blood pressure (!) 107/58, pulse 97, temperature (!) 97.5 F (36.4 C), resp. rate 17, height 5\' 5"  (1.651 m), weight 58.2 kg, SpO2 98%.    PE: Gen: NAD, Alert and Oriented HEENT:  [place]/AT, EOMI Neck: Supple, no LAD Lungs: some lower lung field crackles CV: RRR without M/G/R ABD: Soft, NTND, +BS Ext: No C/C/E  Assessment/Plan: 1) Anemia. 2) History of small bowel AVMs. 3) Pulmonary fibrosis.   The patient is not interested in having any inpatient work up for her anemia.  She wants to have iron supplementation and/or blood transfusions so she can be discharged home.  She is willing to have work up as an outpatient.  There is a history of NSAID use, but it not suspected that this is the source.  There was a finding of small bowel AVMs with a VCE in 2009.  This is potentially the source of her bleeding.  Plan: 1) Iron supplementation and/or transfusion. 2) Upon discharge follow up with Dr. Loreta Ave. 3) Signing off. Breanna Ramirez D 09/22/2023, 4:54 PM

## 2023-09-23 ENCOUNTER — Inpatient Hospital Stay (HOSPITAL_COMMUNITY): Payer: BC Managed Care – PPO

## 2023-09-23 DIAGNOSIS — J441 Chronic obstructive pulmonary disease with (acute) exacerbation: Secondary | ICD-10-CM | POA: Diagnosis not present

## 2023-09-23 DIAGNOSIS — J9621 Acute and chronic respiratory failure with hypoxia: Secondary | ICD-10-CM | POA: Diagnosis not present

## 2023-09-23 DIAGNOSIS — M7989 Other specified soft tissue disorders: Secondary | ICD-10-CM

## 2023-09-23 LAB — CBC WITH DIFFERENTIAL/PLATELET
Abs Immature Granulocytes: 0.08 10*3/uL — ABNORMAL HIGH (ref 0.00–0.07)
Basophils Absolute: 0 10*3/uL (ref 0.0–0.1)
Basophils Relative: 0 %
Eosinophils Absolute: 0 10*3/uL (ref 0.0–0.5)
Eosinophils Relative: 0 %
HCT: 29.5 % — ABNORMAL LOW (ref 36.0–46.0)
Hemoglobin: 7.9 g/dL — ABNORMAL LOW (ref 12.0–15.0)
Immature Granulocytes: 1 %
Lymphocytes Relative: 9 %
Lymphs Abs: 1.2 10*3/uL (ref 0.7–4.0)
MCH: 21.7 pg — ABNORMAL LOW (ref 26.0–34.0)
MCHC: 26.8 g/dL — ABNORMAL LOW (ref 30.0–36.0)
MCV: 81 fL (ref 80.0–100.0)
Monocytes Absolute: 1.1 10*3/uL — ABNORMAL HIGH (ref 0.1–1.0)
Monocytes Relative: 9 %
Neutro Abs: 9.8 10*3/uL — ABNORMAL HIGH (ref 1.7–7.7)
Neutrophils Relative %: 81 %
Platelets: 370 10*3/uL (ref 150–400)
RBC: 3.64 MIL/uL — ABNORMAL LOW (ref 3.87–5.11)
RDW: 24.8 % — ABNORMAL HIGH (ref 11.5–15.5)
WBC: 12.2 10*3/uL — ABNORMAL HIGH (ref 4.0–10.5)
nRBC: 0 % (ref 0.0–0.2)

## 2023-09-23 LAB — BASIC METABOLIC PANEL
Anion gap: 9 (ref 5–15)
BUN: 20 mg/dL (ref 8–23)
CO2: 28 mmol/L (ref 22–32)
Calcium: 9.3 mg/dL (ref 8.9–10.3)
Chloride: 98 mmol/L (ref 98–111)
Creatinine, Ser: 0.73 mg/dL (ref 0.44–1.00)
GFR, Estimated: >60 mL/min (ref >60)
Glucose, Bld: 94 mg/dL (ref 70–99)
Potassium: 3.7 mmol/L (ref 3.5–5.1)
Sodium: 135 mmol/L (ref 135–145)

## 2023-09-23 MED ORDER — DOCUSATE SODIUM 100 MG PO CAPS
100.0000 mg | ORAL_CAPSULE | Freq: Two times a day (BID) | ORAL | Status: DC
  Administered 2023-09-23 – 2023-09-24 (×2): 100 mg via ORAL
  Filled 2023-09-23 (×2): qty 1

## 2023-09-23 MED ORDER — BISACODYL 5 MG PO TBEC: 5.0000 mg | DELAYED_RELEASE_TABLET | Freq: Every day | ORAL | Status: DC | PRN

## 2023-09-23 MED ORDER — SODIUM CHLORIDE 3 % IN NEBU
4.0000 mL | INHALATION_SOLUTION | Freq: Three times a day (TID) | RESPIRATORY_TRACT | Status: DC
  Administered 2023-09-23: 4 mL via RESPIRATORY_TRACT
  Filled 2023-09-23 (×3): qty 4

## 2023-09-23 MED ORDER — POLYETHYLENE GLYCOL 3350 17 G PO PACK: 17.0000 g | PACK | Freq: Every day | ORAL | Status: DC | PRN

## 2023-09-23 NOTE — Hospital Course (Addendum)
66yo with h/o IPF on at bedtime O2, HTN, and HLD who presented on 9/23 with cough and congestion, diagnosed with COPD/pulmonary fibrosis exacerbation.  Concern for LUL PNA (s/p recent outpatient treatment with Levaquin), treated with doxy x 5 days.  Hgb down to 7.4, GI consulted but she does not desire evaluation for GI bleeding at this time and will f/u as outpatient.

## 2023-09-23 NOTE — Plan of Care (Signed)
  Problem: Health Behavior/Discharge Planning: Goal: Ability to manage health-related needs will improve Outcome: Progressing   Problem: Clinical Measurements: Goal: Will remain free from infection Outcome: Progressing   Problem: Nutrition: Goal: Adequate nutrition will be maintained Outcome: Progressing   Problem: Elimination: Goal: Will not experience complications related to bowel motility Outcome: Progressing   Problem: Pain Managment: Goal: General experience of comfort will improve Outcome: Progressing   Problem: Safety: Goal: Ability to remain free from injury will improve Outcome: Progressing

## 2023-09-23 NOTE — Progress Notes (Signed)
Progress Note   Patient: Breanna Ramirez WUJ:[id number] DOB: 1956/01/03 DOA: 09/21/2023     1 DOS: the patient was seen and examined on 09/23/2023   Brief hospital course: 66yo with h/o IPF on at bedtime O2, HTN, and HLD who presented on 9/23 with cough and congestion, diagnosed with COPD/pulmonary fibrosis exacerbation.  Concern for LUL PNA (s/p recent outpatient treatment with Levaquin), treated with doxy x 5 days.  Hgb down to 7.4, GI consulted but she does not desire evaluation for GI bleeding at this time and will f/u as outpatient.  Assessment and Plan:  Progressive dyspnea Chronic hypoxic respiratory failure on nocturnal oxygen Patient endorses progressive worsening dyspnea especially on exertion She has baseline IPF as well as COPD She reports nocturnal O2 only at baseline, currently needing with exertion, O2 sats down to 90% CXR unremarkable but CT with apparent LUL PNA Will consult pulmonology (sees Dr. Francine Graven as an outpatient) Continue Trelegy, Singulair   Acute COPD exacerbation Left upper lobe pneumonia Presented with progressively worsening dyspnea for several weeks Recently treated as an outpatient for pneumonia with Levaquin In the ED, patient was wheezing and was started on IV steroids WBC count was mildly elevated, no fever and procalcitonin was not elevated CT chest findings as above with new focal area of consolidation in the posterior LUL She was started on Doxy but is not feeling significantly better, appears to need broader antibiotic coverage Wheezing is improved and she was switched to oral prednisone for tapering course.   Chronic diastolic CHF  There was some concern for acute exacerbation on admission and she was given IV Lasix Lower concern for this issue currently Echo with preserved EF and grade 1 diastolic dysfunction Negative DVT US  Essential hypertension Continue HCTZ   Acute on chronic anemia Severe iron deficiency H/o GI bleeding Patient  reports getting endoscopy done by Dr. Loreta Ave several years ago.  Hemoglobin at baseline close to 9.   No evidence of bleeding but hemoglobin down to 7.4 yesterday, increased a little today.   Ferritin low at 12 IV iron replacement ordered  GI consulted - she prefers to address lung concerns first and then will f/u with GI for outpatient evaluation Continue PPI   Dyslipidemia Continue simvastatin    Consultants: GI Pulmonology  Procedures: DVT US 9/25 Echo 9/24  Antibiotics: Doxycycline 9/24-28  30 Day Unplanned Readmission Risk Score    Flowsheet Row ED to Hosp-Admission (Current) from 09/21/2023 in [facility]  30 Day Unplanned Readmission Risk Score (%) 14.13 Filed at 09/23/2023 0401       This score is the patient's risk of an unplanned readmission within 30 days of being discharged (0 -100%). The score is based on dignosis, age, lab data, medications, orders, and past utilization.   Low:  0-14.9   Medium: 15-21.9   High: 22-29.9   Extreme: 30 and above           Subjective: She reports still feeling very SOB with exertion and is not feeling better yet.  She is concerned that she has been "around the world" trying to figure out if there are problems with her heart and belly but the real issue is her lungs.   Objective: Vitals:   09/23/23 0852 09/23/23 1203  BP:  (!) 111/59  Pulse:  79  Resp:    Temp:  98 F (36.7 C)  SpO2: 96% 95%   No intake or output data in the 24  hours ending 09/23/23 1611 Filed Weights   09/21/23 1656 09/22/23 0500 09/23/23 1300  Weight: 60.6 kg 58.2 kg 61.6 kg    Exam:  General:  Appears calm and comfortable and is in NAD Eyes:   EOMI, normal lids, iris ENT:  grossly normal hearing, lips & tongue, mmm Neck:  no LAD, masses or thyromegaly Cardiovascular:  RRR. No LE edema.  Respiratory:   Diffuse rales, reasonable air movement.  Normal respiratory effort.  On [place] O2 Abdomen:  soft, NT,  ND Skin:  no rash or induration seen on limited exam Musculoskeletal:  grossly normal tone BUE/BLE, good ROM, no bony abnormality Psychiatric:  blunted mood and affect, speech fluent and appropriate, AOx3 Neurologic:  CN 2-12 grossly intact, moves all extremities in coordinated fashion  Data Reviewed: I have reviewed the patient's lab results since admission.  Pertinent labs for today include:   Normal BMP WBC 12.2, improving Hgb 7.9, stable COVID/flu/RSV negative     Family Communication: None present; I called her daughter and spoke with her by telephone  Disposition: Status is: Inpatient Remains inpatient appropriate because: ongoing evaluation     Time spent: 50 minutes  Unresulted Labs (From admission, onward)     Start     Ordered   09/24/23 0500  CBC with Differential/Platelet  Tomorrow morning,   R        09/23/23 1611   09/24/23 0500  Basic metabolic panel  Tomorrow morning,   R        09/23/23 1611             Author: Jonah Blue, MD 09/23/2023 4:11 PM  For on call review [email or web address].

## 2023-09-23 NOTE — Progress Notes (Signed)
Mobility Specialist - Progress Note   09/23/23 0900  Mobility  Activity Ambulated with assistance in hallway  Level of Assistance Modified independent, requires aide device or extra time  Assistive Device None  Distance Ambulated (ft) 500 ft  Range of Motion/Exercises Active  Activity Response Tolerated well  Mobility Referral Yes  $Mobility charge 1 Mobility  Mobility Specialist Start Time (ACUTE ONLY) 0920  Mobility Specialist Stop Time (ACUTE ONLY) 0930  Mobility Specialist Time Calculation (min) (ACUTE ONLY) 10 min   Pt was received in bed and agreed to mobility.   Nurse requested Mobility Specialist to perform oxygen saturation test with pt which includes removing pt from oxygen both at rest and while ambulating.  Below are the results from that testing.     Patient Saturations on Room Air at Rest = spO2 94 %  Patient Saturations on Room Air while Ambulating = sp02 86% .  Rested and performed pursed lip breathing for 1 minute with sp02 at 88%.  Patient Saturations on 2 Liters of oxygen while Ambulating = sp02 90%  At end of testing pt left in room on 2  Liters of oxygen.  Reported results to nurse.   Pt returned to bed with all needs met.  Marilynne Halsted Mobility Specialist

## 2023-09-23 NOTE — Consult Note (Addendum)
NAME:  Breanna Ramirez, MRN:  [id number], DOB:  [birth year], LOS: 1 ADMISSION DATE:  09/21/2023, CONSULTATION DATE:  09/23/2023  REFERRING MD:  Myrene Buddy, CHIEF COMPLAINT: Hypoxia  History of Present Illness:  67 year old woman with COPD admitted 9/23 with shortness of breath for 2 to 3 weeks.  She was treated as outpatient with a course of Levaquin.  Admission labs showed leukocytosis 14K, hemoglobin 8.1. CT chest showed focal consolidation in left upper lobe and background of emphysema.  She was treated with doxycycline, IV steroids Labs showed iron deficiency anemia with ferritin of 12, IV iron was administered She continues to complain of shortness of breath, pain in her right paraspinal area, dry cough  She quit smoking in 2019. She has a 40+ pack year smoking history.   Pertinent  Medical History  COPD -sees Dr. Francine Graven, FEV1 51% Nocturnal oxygen Right middle lobe atelectasis, resolved on CT 11/2022  Significant Hospital Events: Including procedures, antibiotic start and stop dates in addition to other pertinent events   Echocardiogram 9/24 showed normal LV function, hyperdynamic, mild mitral stenosis 9/25 venous duplex showed no evidence of DVT  Interim History / Subjective:  Complains of dry cough Afebrile   Objective   Blood pressure (!) 111/59, pulse 79, temperature 98 F (36.7 C), temperature source Oral, resp. rate 15, height 5\' 5"  (1.651 m), weight 61.6 kg, SpO2 95%.       No intake or output data in the 24 hours ending 09/23/23 1532 Filed Weights   09/21/23 1656 09/22/23 0500 09/23/23 1300  Weight: 60.6 kg 58.2 kg 61.6 kg    Examination: General: Well-appearing, elderly woman, sitting up in bed, no distress HENT: No pallor, icterus, Lungs: Clear to auscultation, no accessory muscle use, no rhonchi Cardiovascular: S1-S2 regular Abdomen: Soft, nontender and hepatosplenomegaly Extremities: No edema, no deformity Neuro: Alert, interactive, nonfocal   Labs  show mild hypokalemia 3.7, mild leukocytosis, stable anemia 7.9  Resolved Hospital Problem list     Assessment & Plan:  Unclear cause of persistent symptoms.  PE seems unlikely.  There is minimal volume loss in both lower lobes.  There is an area of focal consolidation but this is minimal so I doubt that more antibiotics are necessary. There is no evidence of ILD.  There is no evidence of lung cancer.  She does not have active bronchospasm, she has been treated for COPD exacerbation and transitioned to oral steroids and oral doxycycline. We will add a flutter valve and hypertonic saline nebs to help her with airway clearance Would recommend complete course of prednisone for 2 weeks on discharge She can continue on Trelegy as outpatient.  Acute on chronic hypoxic respiratory failure -she has nocturnal oxygen. Reassess need for daytime oxygen on discharge, she desaturates to 86% on ambulation and recovers with resting.  On 28th of oxygen she maintained saturation of 90%  Iron deficiency anemia -received IV iron, outpatient GI evaluation recommended.  She can follow-up pulmonary office as outpatient  Best Practice (right click and "Reselect all SmartList Selections" daily)   Per [facility] Code Status:  full code Last date of multidisciplinary goals of care discussion [NA]  Labs   CBC: Recent Labs  Lab 09/21/23 1028 09/21/23 1810 09/22/23 0526 09/23/23 0548  WBC 14.3* 11.9* 7.3 12.2*  NEUTROABS 10.6*  --   --  9.8*  HGB 8.1* 9.1* 7.4* 7.9*  HCT 28.1* 32.7* 27.1* 29.5*  MCV 75.1* 78.0* 79.2* 81.0  PLT 418* 493* 362 370    Basic  Metabolic Panel: Recent Labs  Lab 09/21/23 1028 09/21/23 1810 09/22/23 0526 09/23/23 0548  NA 137  --  137 135  K 3.4*  --  4.7 3.7  CL 98  --  101 98  CO2 26  --  27 28  GLUCOSE 83  --  109* 94  BUN 19  --  14 20  CREATININE 0.84 0.89 0.67 0.73  CALCIUM 9.3  --  8.9 9.3   GFR: Estimated Creatinine Clearance: 61.4 mL/min (by C-G formula based on  SCr of 0.73 mg/dL). Recent Labs  Lab 09/21/23 1028 09/21/23 1810 09/22/23 0526 09/23/23 0548  PROCALCITON  --  <0.10  --   --   WBC 14.3* 11.9* 7.3 12.2*    Liver Function Tests: Recent Labs  Lab 09/21/23 1028 09/22/23 0526  AST 14* 16  ALT 12 13  ALKPHOS 50 46  BILITOT 0.7 0.5  PROT 6.5 5.6*  ALBUMIN 3.5 2.9*   No results for input(s): "LIPASE", "AMYLASE" in the last 168 hours. No results for input(s): "AMMONIA" in the last 168 hours.  ABG    Component Value Date/Time   HCO3 27.9 10/31/2021 1131   TCO2 29 10/31/2021 1131   O2SAT 30.0 10/31/2021 1131     Coagulation Profile: No results for input(s): "INR", "PROTIME" in the last 168 hours.  Cardiac Enzymes: No results for input(s): "CKTOTAL", "CKMB", "CKMBINDEX", "TROPONINI" in the last 168 hours.  HbA1C: No results found for: "HGBA1C"  CBG: No results for input(s): "GLUCAP" in the last 168 hours.  Review of Systems:   Constitutional: negative for anorexia, fevers and sweats  Eyes: negative for irritation, redness and visual disturbance  Ears, nose, mouth, throat, and face: negative for earaches, epistaxis, nasal congestion and sore throat  Respiratory: negative for cough,  sputum and wheezing  Cardiovascular: negative for  lower extremity edema, orthopnea, palpitations and syncope  Gastrointestinal: negative for abdominal pain, constipation, diarrhea, melena, nausea and vomiting  Genitourinary:negative for dysuria, frequency and hematuria  Hematologic/lymphatic: negative for bleeding, easy bruising and lymphadenopathy  Musculoskeletal:negative for arthralgias, muscle weakness and stiff joints  Neurological: negative for coordination problems, gait problems, headaches and weakness  Endocrine: negative for diabetic symptoms including polydipsia, polyuria and weight loss   Past Medical History:  She,  has a past medical history of COPD (chronic obstructive pulmonary disease) (HCC), Essential hypertension,  GERD (gastroesophageal reflux disease), Pneumonia (05/2017), and Respiratory failure with hypoxia (HCC) (05/2017).   Surgical History:   Past Surgical History:  Procedure Laterality Date   ENDOMETRIAL ABLATION       Social History:   reports that she quit smoking about 5 years ago. Her smoking use included cigarettes. She started smoking about 50 years ago. She has a 45 pack-year smoking history. She has never used smokeless tobacco. She reports that she does not drink alcohol and does not use drugs.   Family History:  Her family history includes Cancer in her father.   Allergies Allergies  Allergen Reactions   Codeine Nausea And Vomiting     Home Medications  Prior to Admission medications   Medication Sig Start Date End Date Taking? Authorizing Provider  albuterol (PROVENTIL HFA;VENTOLIN HFA) 108 (90 Base) MCG/ACT inhaler Inhale 1-2 puffs into the lungs every 6 (six) hours as needed for wheezing or shortness of breath.   Yes [provider]  esomeprazole (NEXIUM) 40 MG capsule Take 1 capsule (40 mg total) by mouth daily. 09/13/14  Yes Hawks, Neysa Bonito A, FNP  ferrous sulfate 325 (  65 FE) MG tablet Take 1 tablet (325 mg total) by mouth daily. 10/23/18 09/21/23 Yes Marinda Elk, MD  fluticasone (FLONASE) 50 MCG/ACT nasal spray Place 1 spray into both nostrils 2 (two) times daily. 09/30/21  Yes [provider]  Fluticasone-Umeclidin-Vilant (TRELEGY ELLIPTA) 200-62.5-25 MCG/ACT AEPB Inhale 1 puff into the lungs daily. 08/25/22  Yes Martina Sinner, MD  hydrochlorothiazide (HYDRODIURIL) 25 MG tablet Take 1 tablet (25 mg total) by mouth daily. 09/13/14  Yes Hawks, Christy A, FNP  ipratropium-albuterol (DUONEB) 0.5-2.5 (3) MG/3ML SOLN SMARTSIG:1 Ampule(s) Via Nebulizer 3 Times Daily PRN Patient taking differently: Take 3 mLs by nebulization See admin instructions. SMARTSIG:1 Ampule(s) Via Nebulizer 3 Times Daily PRN 02/12/22  Yes Martina Sinner, MD  montelukast  (SINGULAIR) 10 MG tablet Take 1 tablet (10 mg total) by mouth at bedtime. 08/25/22  Yes Martina Sinner, MD  ondansetron (ZOFRAN) 4 MG tablet Take 4 mg by mouth every 8 (eight) hours as needed. 09/09/23  Yes [provider]  simvastatin (ZOCOR) 40 MG tablet Take 1 tablet (40 mg total) by mouth at bedtime. Patient taking differently: Take 40 mg by mouth daily at 6 PM. 09/15/14  Yes Hawks, Edilia Bo, FNP      Cyril Mourning MD. [facility]. [facility] Pager : [phone]  If no response to pager , please call [phone] until 7 pm After 7:00 pm call Elink  [phone]   09/23/2023

## 2023-09-23 NOTE — TOC CM/SW Note (Signed)
Transition of Care [facility]) - Inpatient Brief Assessment   Patient Details  Name: Breanna Ramirez MRN: [id number] Date of Birth: May 11, 1956  Transition [facility]) CM/SW Contact:    Otelia Santee, LCSW Phone Number: 09/23/2023, 12:12 PM   Clinical Narrative: TOC following for possible home O2 need.    Transition of Care Asessment: Insurance and Status: Insurance coverage has been reviewed Patient has primary care physician: Yes Home environment has been reviewed: Home Prior level of function:: Independent/modified independent Prior/Current Home Services: No current home services Social Determinants of Health Reivew: SDOH reviewed no interventions necessary Readmission risk has been reviewed: Yes Transition of care needs: transition of care needs identified, TOC will continue to follow

## 2023-09-23 NOTE — Progress Notes (Signed)
Bilateral lower extremity venous duplex has been completed. Preliminary results can be found in CV Proc through chart review.   09/23/23 8:55 AM Olen Cordial RVT

## 2023-09-24 DIAGNOSIS — R0609 Other forms of dyspnea: Secondary | ICD-10-CM | POA: Diagnosis present

## 2023-09-24 DIAGNOSIS — I5032 Chronic diastolic (congestive) heart failure: Secondary | ICD-10-CM | POA: Diagnosis present

## 2023-09-24 LAB — CBC WITH DIFFERENTIAL/PLATELET
Abs Immature Granulocytes: 0.13 10*3/uL — ABNORMAL HIGH (ref 0.00–0.07)
Basophils Absolute: 0 10*3/uL (ref 0.0–0.1)
Basophils Relative: 0 %
Eosinophils Absolute: 0.1 10*3/uL (ref 0.0–0.5)
Eosinophils Relative: 1 %
HCT: 28.6 % — ABNORMAL LOW (ref 36.0–46.0)
Hemoglobin: 7.8 g/dL — ABNORMAL LOW (ref 12.0–15.0)
Immature Granulocytes: 1 %
Lymphocytes Relative: 16 %
Lymphs Abs: 1.9 10*3/uL (ref 0.7–4.0)
MCH: 21.8 pg — ABNORMAL LOW (ref 26.0–34.0)
MCHC: 27.3 g/dL — ABNORMAL LOW (ref 30.0–36.0)
MCV: 80.1 fL (ref 80.0–100.0)
Monocytes Absolute: 1.1 10*3/uL — ABNORMAL HIGH (ref 0.1–1.0)
Monocytes Relative: 9 %
Neutro Abs: 8.7 10*3/uL — ABNORMAL HIGH (ref 1.7–7.7)
Neutrophils Relative %: 73 %
Platelets: 352 10*3/uL (ref 150–400)
RBC: 3.57 MIL/uL — ABNORMAL LOW (ref 3.87–5.11)
RDW: 25.1 % — ABNORMAL HIGH (ref 11.5–15.5)
WBC: 12 10*3/uL — ABNORMAL HIGH (ref 4.0–10.5)
nRBC: 0 % (ref 0.0–0.2)

## 2023-09-24 LAB — BASIC METABOLIC PANEL
Anion gap: 9 (ref 5–15)
BUN: 17 mg/dL (ref 8–23)
CO2: 28 mmol/L (ref 22–32)
Calcium: 9.7 mg/dL (ref 8.9–10.3)
Chloride: 102 mmol/L (ref 98–111)
Creatinine, Ser: 0.59 mg/dL (ref 0.44–1.00)
GFR, Estimated: >60 mL/min (ref >60)
Glucose, Bld: 76 mg/dL (ref 70–99)
Potassium: 3.8 mmol/L (ref 3.5–5.1)
Sodium: 139 mmol/L (ref 135–145)

## 2023-09-24 MED ORDER — DOXYCYCLINE HYCLATE 100 MG PO TABS: 100.0000 mg | ORAL_TABLET | Freq: Two times a day (BID) | ORAL | 0 refills | Status: AC

## 2023-09-24 MED ORDER — SODIUM CHLORIDE 3 % IN NEBU: 4.0000 mL | INHALATION_SOLUTION | Freq: Three times a day (TID) | RESPIRATORY_TRACT | 12 refills | Status: DC

## 2023-09-24 MED ORDER — PREDNISONE 10 MG PO TABS
ORAL_TABLET | ORAL | 0 refills | Status: AC
Start: 2023-09-24 — End: 2023-10-14

## 2023-09-24 NOTE — TOC Transition Note (Signed)
Transition of Care [facility]) - CM/SW Discharge Note   Patient Details  Name: Breanna Ramirez MRN: [id number] Date of Birth: December 05, 1956  [facility] of [facility]) CM/SW Contact:  Otelia Santee, LCSW Phone Number: 09/24/2023, 12:06 PM   Clinical Narrative:    Met with pt to discuss need for daytime O2. Pt shares she currently receives O2 through [employer] help for night time use. Pt has a concentrator at home and states she is able to utilize this in the day time as well as needed. Pt denies need for travel tank. Pt not on O2 while CSW in room. Pt also shares she has been walking around her room on RA and has not had SOB.    Final next level of care: Home/Self Care Barriers to Discharge: No Barriers Identified   Patient Goals and CMS Choice CMS Medicare.gov Compare Post Acute Care list provided to:: Patient Choice offered to / list presented to : Patient  Discharge Placement                         Discharge Plan and Services Additional resources added to the After Visit Summary for                  DME Arranged: N/A DME Agency: NA                  Social Determinants of Health (SDOH) Interventions SDOH Screenings   Food Insecurity: No Food Insecurity (09/21/2023)  Housing: Low Risk  (09/21/2023)  Transportation Needs: No Transportation Needs (09/21/2023)  Utilities: Not At Risk (09/21/2023)  Tobacco Use: Medium Risk (09/21/2023)     Readmission Risk Interventions    09/23/2023   11:20 AM  Readmission Risk Prevention Plan  Post Dischage Appt Complete  Medication Screening Complete  Transportation Screening Complete

## 2023-09-24 NOTE — Discharge Summary (Addendum)
Medium: 15-21.9   High: 22-29.9   Extreme: 30 and above           Subjective: Feeling much better today and wants to go home ASAP.   Objective: Vitals:   09/24/23 0518 09/24/23 0827  BP: 111/62   Pulse: 65   Resp: 18   Temp: 97.7 F (36.5 C)   SpO2: 98% 99%   No intake or output data in the 24 hours ending 09/24/23 1206 Filed Weights   09/22/23 0500 09/23/23 1300 09/24/23 0518  Weight: 58.2 kg 61.6 kg 62.8 kg    Exam:  General:  Appears calm and comfortable and is in NAD Eyes:   EOMI, normal lids, iris ENT:  grossly normal hearing, lips & tongue, mmm Neck:  no LAD, masses or thyromegaly Cardiovascular:  RRR. No LE edema.  Respiratory:   Scant wheezes and otherwise clear.  Normal respiratory effort.  On RA Abdomen:  soft, NT, ND Skin:  no rash or induration seen on limited  exam Musculoskeletal:  grossly normal tone BUE/BLE, good ROM, no bony abnormality Psychiatric:  much more animated mood and affect, speech fluent and appropriate, AOx3 Neurologic:  CN 2-12 grossly intact, moves all extremities in coordinated fashion  Data Reviewed: I have reviewed the patient's lab results since admission.  Pertinent labs for today include:   Normal BMP WBC 12.2, improving Hgb 7.9, stable COVID/flu/RSV negative    Condition at discharge: improving  The results of significant diagnostics from this hospitalization (including imaging, microbiology, ancillary and laboratory) are listed below for reference.   Imaging Studies: VAS [place] LOWER EXTREMITY VENOUS (DVT)  Result Date: 09/23/2023  Lower Venous DVT Study Patient Name:  Breanna Ramirez  Date of Exam:   09/23/2023 Medical Rec #: [id number]        Accession #:    [id number] Date of Birth: 04/18/1956       Patient Gender: F Patient Age:   67 years Exam Location:  [facility] Procedure:      VAS [place] LOWER EXTREMITY VENOUS (DVT) Referring Phys: Lorin Glass --------------------------------------------------------------------------------  Indications: Swelling.  Risk Factors: None identified. Comparison Study: No prior studies. Performing Technologist: Chanda Busing RVT  Examination Guidelines: A complete evaluation includes B-mode imaging, spectral Doppler, color Doppler, and power Doppler as needed of all accessible portions of each vessel. Bilateral testing is considered an integral part of a complete examination. Limited examinations for reoccurring indications may be performed as noted. The reflux portion of the exam is performed with the patient in reverse Trendelenburg.  +---------+---------------+---------+-----------+----------+--------------+ RIGHT    CompressibilityPhasicitySpontaneityPropertiesThrombus Aging +---------+---------------+---------+-----------+----------+--------------+ CFV      Full            Yes      Yes                                 +---------+---------------+---------+-----------+----------+--------------+ SFJ      Full                                                        +---------+---------------+---------+-----------+----------+--------------+ FV Prox  Full                                                        +---------+---------------+---------+-----------+----------+--------------+  Medium: 15-21.9   High: 22-29.9   Extreme: 30 and above           Subjective: Feeling much better today and wants to go home ASAP.   Objective: Vitals:   09/24/23 0518 09/24/23 0827  BP: 111/62   Pulse: 65   Resp: 18   Temp: 97.7 F (36.5 C)   SpO2: 98% 99%   No intake or output data in the 24 hours ending 09/24/23 1206 Filed Weights   09/22/23 0500 09/23/23 1300 09/24/23 0518  Weight: 58.2 kg 61.6 kg 62.8 kg    Exam:  General:  Appears calm and comfortable and is in NAD Eyes:   EOMI, normal lids, iris ENT:  grossly normal hearing, lips & tongue, mmm Neck:  no LAD, masses or thyromegaly Cardiovascular:  RRR. No LE edema.  Respiratory:   Scant wheezes and otherwise clear.  Normal respiratory effort.  On RA Abdomen:  soft, NT, ND Skin:  no rash or induration seen on limited  exam Musculoskeletal:  grossly normal tone BUE/BLE, good ROM, no bony abnormality Psychiatric:  much more animated mood and affect, speech fluent and appropriate, AOx3 Neurologic:  CN 2-12 grossly intact, moves all extremities in coordinated fashion  Data Reviewed: I have reviewed the patient's lab results since admission.  Pertinent labs for today include:   Normal BMP WBC 12.2, improving Hgb 7.9, stable COVID/flu/RSV negative    Condition at discharge: improving  The results of significant diagnostics from this hospitalization (including imaging, microbiology, ancillary and laboratory) are listed below for reference.   Imaging Studies: VAS [place] LOWER EXTREMITY VENOUS (DVT)  Result Date: 09/23/2023  Lower Venous DVT Study Patient Name:  Breanna Ramirez  Date of Exam:   09/23/2023 Medical Rec #: [id number]        Accession #:    [id number] Date of Birth: 04/18/1956       Patient Gender: F Patient Age:   67 years Exam Location:  [facility] Procedure:      VAS [place] LOWER EXTREMITY VENOUS (DVT) Referring Phys: Lorin Glass --------------------------------------------------------------------------------  Indications: Swelling.  Risk Factors: None identified. Comparison Study: No prior studies. Performing Technologist: Chanda Busing RVT  Examination Guidelines: A complete evaluation includes B-mode imaging, spectral Doppler, color Doppler, and power Doppler as needed of all accessible portions of each vessel. Bilateral testing is considered an integral part of a complete examination. Limited examinations for reoccurring indications may be performed as noted. The reflux portion of the exam is performed with the patient in reverse Trendelenburg.  +---------+---------------+---------+-----------+----------+--------------+ RIGHT    CompressibilityPhasicitySpontaneityPropertiesThrombus Aging +---------+---------------+---------+-----------+----------+--------------+ CFV      Full            Yes      Yes                                 +---------+---------------+---------+-----------+----------+--------------+ SFJ      Full                                                        +---------+---------------+---------+-----------+----------+--------------+ FV Prox  Full                                                        +---------+---------------+---------+-----------+----------+--------------+  FV DistalFull                                                        +---------+---------------+---------+-----------+----------+--------------+ PFV      Full                                                        +---------+---------------+---------+-----------+----------+--------------+ POP      Full           Yes      Yes                                 +---------+---------------+---------+-----------+----------+--------------+ PTV      Full                                                        +---------+---------------+---------+-----------+----------+--------------+ PERO     Full                                                        +---------+---------------+---------+-----------+----------+--------------+     Summary: RIGHT: - There is no evidence of deep vein thrombosis in the lower extremity.  - No cystic structure found in the popliteal fossa.  LEFT: - There is no evidence of deep vein thrombosis in the lower extremity.  - No cystic structure found in the popliteal fossa.  *See table(s) above for measurements and observations. Electronically signed by Coral Else MD on 09/23/2023 at 10:16:10 PM.    Final    ECHOCARDIOGRAM COMPLETE  Result Date: 09/22/2023    ECHOCARDIOGRAM REPORT   Patient Name:   Breanna Ramirez Date of Exam: 09/22/2023 Medical Rec #:  [id number]       Height:       65.0 in Accession #:    [id number]      Weight:       128.3 lb Date of Birth:  10-05-1956      BSA:          1.638 m Patient Age:    67 years        BP:           108/56 mmHg Patient Gender: F               HR:           76 bpm. Exam Location:  Inpatient Procedure: 2D Echo, Cardiac Doppler and Color Doppler Indications:    R94.31 Abnormal EKG  History:         Patient has no prior history of Echocardiogram examinations.                 COPD, Signs/Symptoms:Shortness of Breath and Dyspnea; Risk  Physician Discharge Summary   Patient: Breanna Ramirez MRN: [id number] DOB: Aug 04, 1956  Admit date:     09/21/2023  Discharge date: 09/24/23  Discharge Physician: Jonah Blue   PCP: Salli Real, MD   Recommendations at discharge:   Follow up with Dr. Francine Graven - call for an appointment Follow up with GI regarding anemia and need for further evaluation Follow up with Dr. Wynelle Link in 1-2 weeks Complete 2 more days of doxycycline and 2 weeks of prednisone  Discharge Diagnoses: Principal Problem:   Dyspnea on exertion Active Problems:   Essential hypertension, benign   Chronic hypoxic respiratory failure, on home oxygen therapy (HCC)   IDA (iron deficiency anemia)   COPD with acute exacerbation (HCC)   Hyperlipidemia   Chronic diastolic CHF (congestive heart failure) [facility])    Hospital Course: 66yo with h/o IPF on at bedtime O2, HTN, and HLD who presented on 9/23 with cough and congestion, diagnosed with COPD/pulmonary fibrosis exacerbation.  Concern for LUL PNA (s/p recent outpatient treatment with Levaquin), treated with doxy x 5 days.  Hgb down to 7.4, GI consulted but she does not desire evaluation for GI bleeding at this time and will f/u as outpatient.  PCCM consulted and does not see evidence of ILD.  She is recommended to have flutter valve, hypertonic saline nebs, and 2 weeks of prednisone at the time of discharge.  She may require daytime O2.  Assessment and Plan:  Progressive dyspnea Acute on chronic hypoxic respiratory failure on nocturnal oxygen Patient endorses progressive worsening dyspnea especially on exertion She has baseline IPF as well as COPD She reports nocturnal O2 only at baseline, currently needing with exertion, O2 sats down to 90% CXR unremarkable but CT with apparent LUL PNA Consulted pulmonology (sees Dr. Francine Graven as an outpatient, will need outpatient f/u) Continue Trelegy, Singulair Acute on chronic hypoxic respiratory failure -she has nocturnal  oxygen. Reassess need for daytime oxygen on discharge, she desaturates to 86% on ambulation and recovers with resting; she does not want to get portable O2 prior to transport and prefers to dc with current home O2 without further intervention   Acute COPD exacerbation Presented with progressively worsening dyspnea for several weeks Recently treated as an outpatient for pneumonia with Levaquin In the ED, patient was wheezing and was started on IV steroids WBC count was mildly elevated, no fever and procalcitonin was not elevated CT chest findings as above with new focal area of consolidation in the posterior LUL She was started on Doxy and is now improving Wheezing is improved and she was switched to oral prednisone for tapering course x 2 weeks Pulmonology consulted and does not recommend broadening antibiotic coverage Dr. Vassie Loll also noted that there is no evidence of ILD or lung cancer Continue flutter valve and hypertonic saline nebs to help her with airway clearance She can continue on Trelegy as outpatient   Chronic diastolic CHF  There was some concern for acute exacerbation on admission and she was given IV Lasix Lower concern for this issue currently Echo with preserved EF and grade 1 diastolic dysfunction Negative DVT US   Essential hypertension Continue HCTZ   Acute on chronic anemia Severe iron deficiency H/o GI bleeding Patient reports getting endoscopy done by Dr. Loreta Ave several years ago.  Hemoglobin at baseline close to 9.   No evidence of bleeding but hemoglobin down to 7-8   IV iron replacement ordered  GI consulted - she prefers to address lung concerns first and then will f/u  Medium: 15-21.9   High: 22-29.9   Extreme: 30 and above           Subjective: Feeling much better today and wants to go home ASAP.   Objective: Vitals:   09/24/23 0518 09/24/23 0827  BP: 111/62   Pulse: 65   Resp: 18   Temp: 97.7 F (36.5 C)   SpO2: 98% 99%   No intake or output data in the 24 hours ending 09/24/23 1206 Filed Weights   09/22/23 0500 09/23/23 1300 09/24/23 0518  Weight: 58.2 kg 61.6 kg 62.8 kg    Exam:  General:  Appears calm and comfortable and is in NAD Eyes:   EOMI, normal lids, iris ENT:  grossly normal hearing, lips & tongue, mmm Neck:  no LAD, masses or thyromegaly Cardiovascular:  RRR. No LE edema.  Respiratory:   Scant wheezes and otherwise clear.  Normal respiratory effort.  On RA Abdomen:  soft, NT, ND Skin:  no rash or induration seen on limited  exam Musculoskeletal:  grossly normal tone BUE/BLE, good ROM, no bony abnormality Psychiatric:  much more animated mood and affect, speech fluent and appropriate, AOx3 Neurologic:  CN 2-12 grossly intact, moves all extremities in coordinated fashion  Data Reviewed: I have reviewed the patient's lab results since admission.  Pertinent labs for today include:   Normal BMP WBC 12.2, improving Hgb 7.9, stable COVID/flu/RSV negative    Condition at discharge: improving  The results of significant diagnostics from this hospitalization (including imaging, microbiology, ancillary and laboratory) are listed below for reference.   Imaging Studies: VAS [place] LOWER EXTREMITY VENOUS (DVT)  Result Date: 09/23/2023  Lower Venous DVT Study Patient Name:  Breanna Ramirez  Date of Exam:   09/23/2023 Medical Rec #: [id number]        Accession #:    [id number] Date of Birth: 04/18/1956       Patient Gender: F Patient Age:   67 years Exam Location:  [facility] Procedure:      VAS [place] LOWER EXTREMITY VENOUS (DVT) Referring Phys: Lorin Glass --------------------------------------------------------------------------------  Indications: Swelling.  Risk Factors: None identified. Comparison Study: No prior studies. Performing Technologist: Chanda Busing RVT  Examination Guidelines: A complete evaluation includes B-mode imaging, spectral Doppler, color Doppler, and power Doppler as needed of all accessible portions of each vessel. Bilateral testing is considered an integral part of a complete examination. Limited examinations for reoccurring indications may be performed as noted. The reflux portion of the exam is performed with the patient in reverse Trendelenburg.  +---------+---------------+---------+-----------+----------+--------------+ RIGHT    CompressibilityPhasicitySpontaneityPropertiesThrombus Aging +---------+---------------+---------+-----------+----------+--------------+ CFV      Full            Yes      Yes                                 +---------+---------------+---------+-----------+----------+--------------+ SFJ      Full                                                        +---------+---------------+---------+-----------+----------+--------------+ FV Prox  Full                                                        +---------+---------------+---------+-----------+----------+--------------+  Medium: 15-21.9   High: 22-29.9   Extreme: 30 and above           Subjective: Feeling much better today and wants to go home ASAP.   Objective: Vitals:   09/24/23 0518 09/24/23 0827  BP: 111/62   Pulse: 65   Resp: 18   Temp: 97.7 F (36.5 C)   SpO2: 98% 99%   No intake or output data in the 24 hours ending 09/24/23 1206 Filed Weights   09/22/23 0500 09/23/23 1300 09/24/23 0518  Weight: 58.2 kg 61.6 kg 62.8 kg    Exam:  General:  Appears calm and comfortable and is in NAD Eyes:   EOMI, normal lids, iris ENT:  grossly normal hearing, lips & tongue, mmm Neck:  no LAD, masses or thyromegaly Cardiovascular:  RRR. No LE edema.  Respiratory:   Scant wheezes and otherwise clear.  Normal respiratory effort.  On RA Abdomen:  soft, NT, ND Skin:  no rash or induration seen on limited  exam Musculoskeletal:  grossly normal tone BUE/BLE, good ROM, no bony abnormality Psychiatric:  much more animated mood and affect, speech fluent and appropriate, AOx3 Neurologic:  CN 2-12 grossly intact, moves all extremities in coordinated fashion  Data Reviewed: I have reviewed the patient's lab results since admission.  Pertinent labs for today include:   Normal BMP WBC 12.2, improving Hgb 7.9, stable COVID/flu/RSV negative    Condition at discharge: improving  The results of significant diagnostics from this hospitalization (including imaging, microbiology, ancillary and laboratory) are listed below for reference.   Imaging Studies: VAS [place] LOWER EXTREMITY VENOUS (DVT)  Result Date: 09/23/2023  Lower Venous DVT Study Patient Name:  Breanna Ramirez  Date of Exam:   09/23/2023 Medical Rec #: [id number]        Accession #:    [id number] Date of Birth: 04/18/1956       Patient Gender: F Patient Age:   67 years Exam Location:  [facility] Procedure:      VAS [place] LOWER EXTREMITY VENOUS (DVT) Referring Phys: Lorin Glass --------------------------------------------------------------------------------  Indications: Swelling.  Risk Factors: None identified. Comparison Study: No prior studies. Performing Technologist: Chanda Busing RVT  Examination Guidelines: A complete evaluation includes B-mode imaging, spectral Doppler, color Doppler, and power Doppler as needed of all accessible portions of each vessel. Bilateral testing is considered an integral part of a complete examination. Limited examinations for reoccurring indications may be performed as noted. The reflux portion of the exam is performed with the patient in reverse Trendelenburg.  +---------+---------------+---------+-----------+----------+--------------+ RIGHT    CompressibilityPhasicitySpontaneityPropertiesThrombus Aging +---------+---------------+---------+-----------+----------+--------------+ CFV      Full            Yes      Yes                                 +---------+---------------+---------+-----------+----------+--------------+ SFJ      Full                                                        +---------+---------------+---------+-----------+----------+--------------+ FV Prox  Full                                                        +---------+---------------+---------+-----------+----------+--------------+  FV DistalFull                                                        +---------+---------------+---------+-----------+----------+--------------+ PFV      Full                                                        +---------+---------------+---------+-----------+----------+--------------+ POP      Full           Yes      Yes                                 +---------+---------------+---------+-----------+----------+--------------+ PTV      Full                                                        +---------+---------------+---------+-----------+----------+--------------+ PERO     Full                                                        +---------+---------------+---------+-----------+----------+--------------+     Summary: RIGHT: - There is no evidence of deep vein thrombosis in the lower extremity.  - No cystic structure found in the popliteal fossa.  LEFT: - There is no evidence of deep vein thrombosis in the lower extremity.  - No cystic structure found in the popliteal fossa.  *See table(s) above for measurements and observations. Electronically signed by Coral Else MD on 09/23/2023 at 10:16:10 PM.    Final    ECHOCARDIOGRAM COMPLETE  Result Date: 09/22/2023    ECHOCARDIOGRAM REPORT   Patient Name:   Breanna Ramirez Date of Exam: 09/22/2023 Medical Rec #:  [id number]       Height:       65.0 in Accession #:    [id number]      Weight:       128.3 lb Date of Birth:  10-05-1956      BSA:          1.638 m Patient Age:    67 years        BP:           108/56 mmHg Patient Gender: F               HR:           76 bpm. Exam Location:  Inpatient Procedure: 2D Echo, Cardiac Doppler and Color Doppler Indications:    R94.31 Abnormal EKG  History:         Patient has no prior history of Echocardiogram examinations.                 COPD, Signs/Symptoms:Shortness of Breath and Dyspnea; Risk  Medium: 15-21.9   High: 22-29.9   Extreme: 30 and above           Subjective: Feeling much better today and wants to go home ASAP.   Objective: Vitals:   09/24/23 0518 09/24/23 0827  BP: 111/62   Pulse: 65   Resp: 18   Temp: 97.7 F (36.5 C)   SpO2: 98% 99%   No intake or output data in the 24 hours ending 09/24/23 1206 Filed Weights   09/22/23 0500 09/23/23 1300 09/24/23 0518  Weight: 58.2 kg 61.6 kg 62.8 kg    Exam:  General:  Appears calm and comfortable and is in NAD Eyes:   EOMI, normal lids, iris ENT:  grossly normal hearing, lips & tongue, mmm Neck:  no LAD, masses or thyromegaly Cardiovascular:  RRR. No LE edema.  Respiratory:   Scant wheezes and otherwise clear.  Normal respiratory effort.  On RA Abdomen:  soft, NT, ND Skin:  no rash or induration seen on limited  exam Musculoskeletal:  grossly normal tone BUE/BLE, good ROM, no bony abnormality Psychiatric:  much more animated mood and affect, speech fluent and appropriate, AOx3 Neurologic:  CN 2-12 grossly intact, moves all extremities in coordinated fashion  Data Reviewed: I have reviewed the patient's lab results since admission.  Pertinent labs for today include:   Normal BMP WBC 12.2, improving Hgb 7.9, stable COVID/flu/RSV negative    Condition at discharge: improving  The results of significant diagnostics from this hospitalization (including imaging, microbiology, ancillary and laboratory) are listed below for reference.   Imaging Studies: VAS [place] LOWER EXTREMITY VENOUS (DVT)  Result Date: 09/23/2023  Lower Venous DVT Study Patient Name:  Breanna Ramirez  Date of Exam:   09/23/2023 Medical Rec #: [id number]        Accession #:    [id number] Date of Birth: 04/18/1956       Patient Gender: F Patient Age:   67 years Exam Location:  [facility] Procedure:      VAS [place] LOWER EXTREMITY VENOUS (DVT) Referring Phys: Lorin Glass --------------------------------------------------------------------------------  Indications: Swelling.  Risk Factors: None identified. Comparison Study: No prior studies. Performing Technologist: Chanda Busing RVT  Examination Guidelines: A complete evaluation includes B-mode imaging, spectral Doppler, color Doppler, and power Doppler as needed of all accessible portions of each vessel. Bilateral testing is considered an integral part of a complete examination. Limited examinations for reoccurring indications may be performed as noted. The reflux portion of the exam is performed with the patient in reverse Trendelenburg.  +---------+---------------+---------+-----------+----------+--------------+ RIGHT    CompressibilityPhasicitySpontaneityPropertiesThrombus Aging +---------+---------------+---------+-----------+----------+--------------+ CFV      Full            Yes      Yes                                 +---------+---------------+---------+-----------+----------+--------------+ SFJ      Full                                                        +---------+---------------+---------+-----------+----------+--------------+ FV Prox  Full                                                        +---------+---------------+---------+-----------+----------+--------------+  FV DistalFull                                                        +---------+---------------+---------+-----------+----------+--------------+ PFV      Full                                                        +---------+---------------+---------+-----------+----------+--------------+ POP      Full           Yes      Yes                                 +---------+---------------+---------+-----------+----------+--------------+ PTV      Full                                                        +---------+---------------+---------+-----------+----------+--------------+ PERO     Full                                                        +---------+---------------+---------+-----------+----------+--------------+     Summary: RIGHT: - There is no evidence of deep vein thrombosis in the lower extremity.  - No cystic structure found in the popliteal fossa.  LEFT: - There is no evidence of deep vein thrombosis in the lower extremity.  - No cystic structure found in the popliteal fossa.  *See table(s) above for measurements and observations. Electronically signed by Coral Else MD on 09/23/2023 at 10:16:10 PM.    Final    ECHOCARDIOGRAM COMPLETE  Result Date: 09/22/2023    ECHOCARDIOGRAM REPORT   Patient Name:   Breanna Ramirez Date of Exam: 09/22/2023 Medical Rec #:  [id number]       Height:       65.0 in Accession #:    [id number]      Weight:       128.3 lb Date of Birth:  10-05-1956      BSA:          1.638 m Patient Age:    67 years        BP:           108/56 mmHg Patient Gender: F               HR:           76 bpm. Exam Location:  Inpatient Procedure: 2D Echo, Cardiac Doppler and Color Doppler Indications:    R94.31 Abnormal EKG  History:         Patient has no prior history of Echocardiogram examinations.                 COPD, Signs/Symptoms:Shortness of Breath and Dyspnea; Risk

## 2023-10-14 ENCOUNTER — Other Ambulatory Visit (HOSPITAL_COMMUNITY): Payer: Self-pay

## 2023-12-17 ENCOUNTER — Ambulatory Visit (HOSPITAL_BASED_OUTPATIENT_CLINIC_OR_DEPARTMENT_OTHER)
Admission: RE | Admit: 2023-12-17 | Discharge: 2023-12-17 | Disposition: A | Discharge status: Home / Self Care | Payer: BC Managed Care – PPO | Source: Ambulatory Visit | Attending: Acute Care | Admitting: Acute Care

## 2023-12-17 DIAGNOSIS — J439 Emphysema, unspecified: Secondary | ICD-10-CM | POA: Diagnosis not present

## 2023-12-17 DIAGNOSIS — Z122 Encounter for screening for malignant neoplasm of respiratory organs: Secondary | ICD-10-CM | POA: Insufficient documentation

## 2023-12-17 DIAGNOSIS — I2510 Atherosclerotic heart disease of native coronary artery without angina pectoris: Secondary | ICD-10-CM | POA: Insufficient documentation

## 2023-12-17 DIAGNOSIS — Z87891 Personal history of nicotine dependence: Secondary | ICD-10-CM | POA: Insufficient documentation

## 2023-12-17 DIAGNOSIS — M8580 Other specified disorders of bone density and structure, unspecified site: Secondary | ICD-10-CM | POA: Diagnosis not present

## 2023-12-17 DIAGNOSIS — I700 Atherosclerosis of aorta: Secondary | ICD-10-CM | POA: Diagnosis not present

## 2023-12-31 ENCOUNTER — Other Ambulatory Visit: Payer: Self-pay

## 2023-12-31 DIAGNOSIS — Z122 Encounter for screening for malignant neoplasm of respiratory organs: Secondary | ICD-10-CM

## 2023-12-31 DIAGNOSIS — Z87891 Personal history of nicotine dependence: Secondary | ICD-10-CM

## 2024-01-13 IMAGING — MG MM DIGITAL SCREENING BILAT W/ TOMO AND CAD
6 of 10 series · 6 of 30 positions shown · non-contrast
Comparison: Previous exam(s).

CLINICAL DATA: Screening.

EXAM:
DIGITAL SCREENING BILATERAL MAMMOGRAM WITH TOMOSYNTHESIS AND CAD
TECHNIQUE: Bilateral screening digital craniocaudal and mediolateral oblique
mammograms were obtained. Bilateral screening digital breast
tomosynthesis was performed. The images were evaluated with
computer-aided detection.

[L MLO synth-2D]
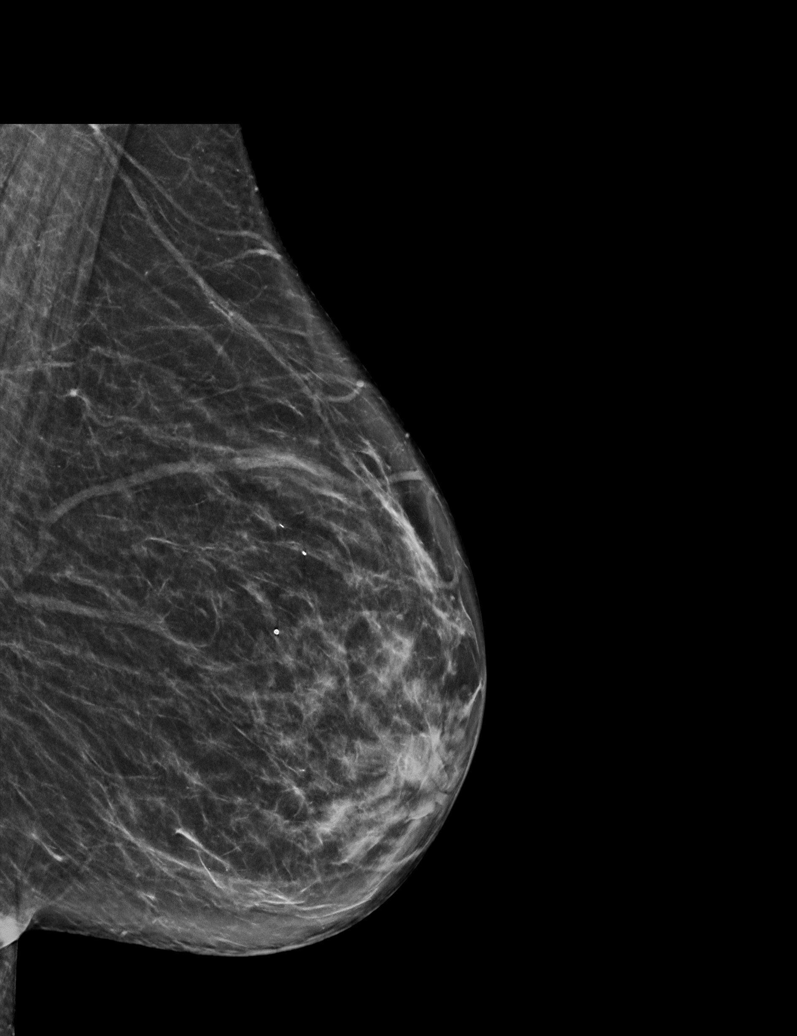

[R MLO synth-2D]
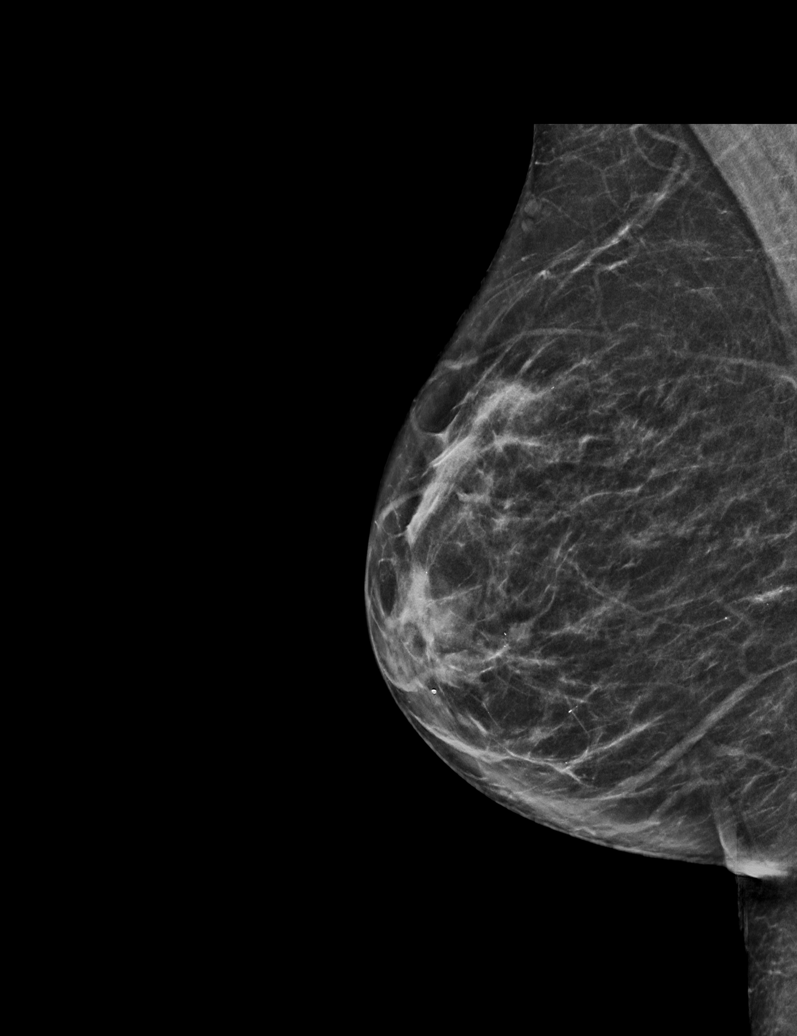

[R CC synth-2D]
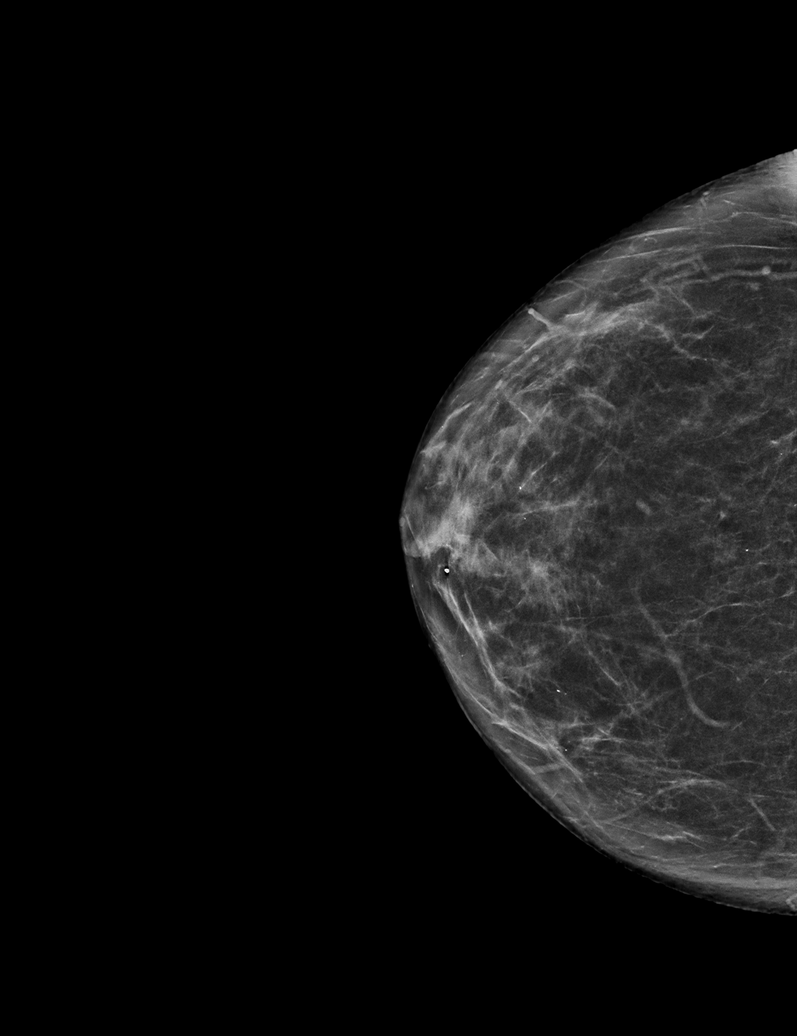

[R XCCL synth-2D]
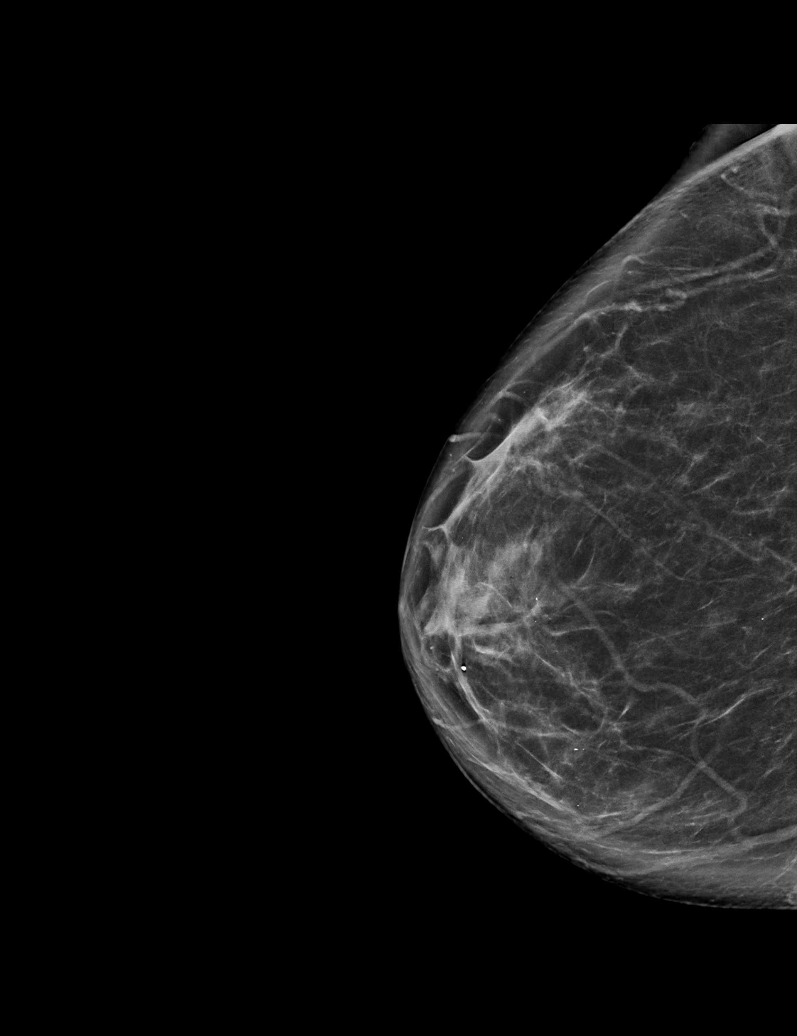

[L CC synth-2D]
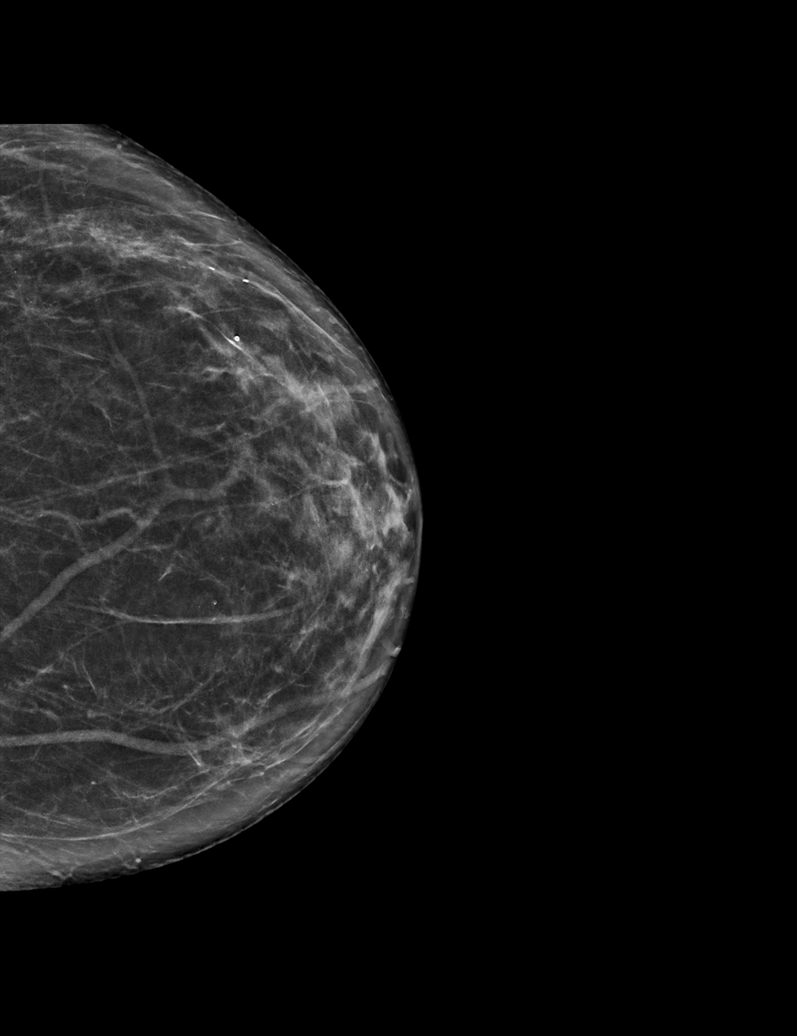

[R MLO tomo · tomo slice 31/62.0]
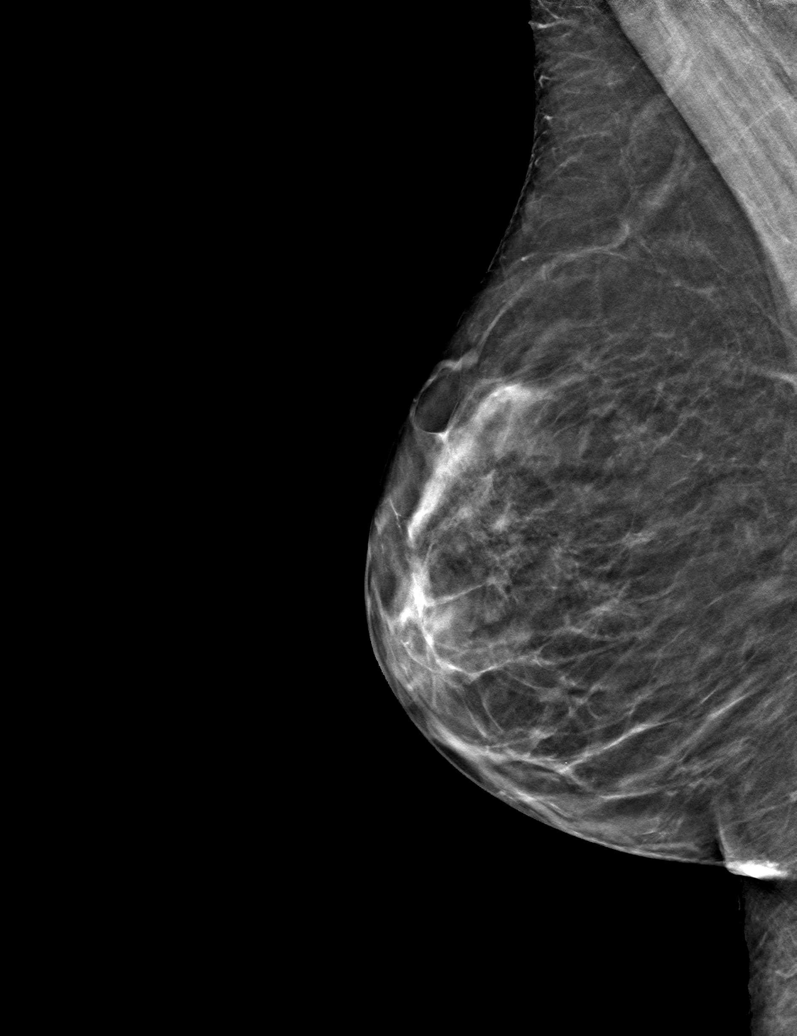

[6 of 30 positions shown; findings below may reference images not displayed]

ACR Breast Density Category b: There are scattered areas of
fibroglandular density.
FINDINGS: There are no findings suspicious for malignancy.
IMPRESSION: No mammographic evidence of malignancy. A result letter of this
screening mammogram will be mailed directly to the patient.

RECOMMENDATION:
Screening mammogram in one year. (Code:51-O-LD2)

BI-RADS CATEGORY  1: Negative.

## 2024-03-14 ENCOUNTER — Emergency Department (HOSPITAL_COMMUNITY)

## 2024-03-14 ENCOUNTER — Encounter (HOSPITAL_COMMUNITY): Payer: Self-pay | Admitting: Radiology

## 2024-03-14 ENCOUNTER — Inpatient Hospital Stay (HOSPITAL_COMMUNITY)
Admission: EM | Admit: 2024-03-14 | Discharge: 2024-03-16 | DRG: 193 | Disposition: A | Discharge status: Home / Self Care | Attending: Family Medicine | Admitting: Family Medicine

## 2024-03-14 ENCOUNTER — Other Ambulatory Visit: Payer: Self-pay

## 2024-03-14 DIAGNOSIS — Z79899 Other long term (current) drug therapy: Secondary | ICD-10-CM | POA: Diagnosis not present

## 2024-03-14 DIAGNOSIS — J449 Chronic obstructive pulmonary disease, unspecified: Secondary | ICD-10-CM | POA: Diagnosis present

## 2024-03-14 DIAGNOSIS — I10 Essential (primary) hypertension: Secondary | ICD-10-CM | POA: Diagnosis present

## 2024-03-14 DIAGNOSIS — E785 Hyperlipidemia, unspecified: Secondary | ICD-10-CM | POA: Diagnosis present

## 2024-03-14 DIAGNOSIS — K219 Gastro-esophageal reflux disease without esophagitis: Secondary | ICD-10-CM | POA: Diagnosis present

## 2024-03-14 DIAGNOSIS — J8410 Pulmonary fibrosis, unspecified: Secondary | ICD-10-CM | POA: Diagnosis present

## 2024-03-14 DIAGNOSIS — J189 Pneumonia, unspecified organism: Principal | ICD-10-CM | POA: Diagnosis present

## 2024-03-14 DIAGNOSIS — J441 Chronic obstructive pulmonary disease with (acute) exacerbation: Secondary | ICD-10-CM | POA: Diagnosis present

## 2024-03-14 DIAGNOSIS — I5032 Chronic diastolic (congestive) heart failure: Secondary | ICD-10-CM | POA: Diagnosis present

## 2024-03-14 DIAGNOSIS — Z87891 Personal history of nicotine dependence: Secondary | ICD-10-CM

## 2024-03-14 DIAGNOSIS — I110 Hypertensive heart disease with heart failure: Secondary | ICD-10-CM | POA: Diagnosis present

## 2024-03-14 DIAGNOSIS — Z885 Allergy status to narcotic agent status: Secondary | ICD-10-CM | POA: Diagnosis not present

## 2024-03-14 DIAGNOSIS — J9621 Acute and chronic respiratory failure with hypoxia: Secondary | ICD-10-CM | POA: Diagnosis present

## 2024-03-14 DIAGNOSIS — J440 Chronic obstructive pulmonary disease with acute lower respiratory infection: Secondary | ICD-10-CM | POA: Diagnosis present

## 2024-03-14 LAB — COMPREHENSIVE METABOLIC PANEL
ALT: 10 U/L (ref 0–44)
AST: 16 U/L (ref 15–41)
Albumin: 3.1 g/dL — ABNORMAL LOW (ref 3.5–5.0)
Alkaline Phosphatase: 55 U/L (ref 38–126)
Anion gap: 9 (ref 5–15)
BUN: 13 mg/dL (ref 8–23)
CO2: 28 mmol/L (ref 22–32)
Calcium: 9.5 mg/dL (ref 8.9–10.3)
Chloride: 98 mmol/L (ref 98–111)
Creatinine, Ser: 0.76 mg/dL (ref 0.44–1.00)
GFR, Estimated: >60 mL/min (ref >60)
Glucose, Bld: 154 mg/dL — ABNORMAL HIGH (ref 70–99)
Potassium: 4.2 mmol/L (ref 3.5–5.1)
Sodium: 135 mmol/L (ref 135–145)
Total Bilirubin: 0.3 mg/dL (ref 0.0–1.2)
Total Protein: 6.6 g/dL (ref 6.5–8.1)

## 2024-03-14 LAB — CBC WITH DIFFERENTIAL/PLATELET
Abs Immature Granulocytes: 0.04 10*3/uL (ref 0.00–0.07)
Basophils Absolute: 0 10*3/uL (ref 0.0–0.1)
Basophils Relative: 0 %
Eosinophils Absolute: 0 10*3/uL (ref 0.0–0.5)
Eosinophils Relative: 0 %
HCT: 34.7 % — ABNORMAL LOW (ref 36.0–46.0)
Hemoglobin: 10.8 g/dL — ABNORMAL LOW (ref 12.0–15.0)
Immature Granulocytes: 1 %
Lymphocytes Relative: 6 %
Lymphs Abs: 0.4 10*3/uL — ABNORMAL LOW (ref 0.7–4.0)
MCH: 27.8 pg (ref 26.0–34.0)
MCHC: 31.1 g/dL (ref 30.0–36.0)
MCV: 89.4 fL (ref 80.0–100.0)
Monocytes Absolute: 0.2 10*3/uL (ref 0.1–1.0)
Monocytes Relative: 3 %
Neutro Abs: 5.8 10*3/uL (ref 1.7–7.7)
Neutrophils Relative %: 90 %
Platelets: 261 10*3/uL (ref 150–400)
RBC: 3.88 MIL/uL (ref 3.87–5.11)
RDW: 15.6 % — ABNORMAL HIGH (ref 11.5–15.5)
WBC: 6.4 10*3/uL (ref 4.0–10.5)
nRBC: 0 % (ref 0.0–0.2)

## 2024-03-14 LAB — BRAIN NATRIURETIC PEPTIDE: B Natriuretic Peptide: 66.5 pg/mL (ref 0.0–100.0)

## 2024-03-14 MED ORDER — ONDANSETRON HCL 4 MG/2ML IJ SOLN: 4.0000 mg | Freq: Four times a day (QID) | INTRAMUSCULAR | Status: DC | PRN

## 2024-03-14 MED ORDER — SENNOSIDES-DOCUSATE SODIUM 8.6-50 MG PO TABS
1.0000 | ORAL_TABLET | Freq: Every evening | ORAL | Status: DC | PRN
  Administered 2024-03-15: 1 via ORAL
  Filled 2024-03-14: qty 1

## 2024-03-14 MED ORDER — SODIUM CHLORIDE 0.9 % IV SOLN
2.0000 g | Freq: Once | INTRAVENOUS | Status: AC
  Administered 2024-03-14: 2 g via INTRAVENOUS
  Filled 2024-03-14: qty 12.5

## 2024-03-14 MED ORDER — VANCOMYCIN HCL IN DEXTROSE 1-5 GM/200ML-% IV SOLN
1000.0000 mg | Freq: Once | INTRAVENOUS | Status: AC
  Administered 2024-03-14: 1000 mg via INTRAVENOUS
  Filled 2024-03-14: qty 200

## 2024-03-14 MED ORDER — ALBUTEROL SULFATE HFA 108 (90 BASE) MCG/ACT IN AERS: 2.0000 | INHALATION_SPRAY | RESPIRATORY_TRACT | Status: DC | PRN

## 2024-03-14 MED ORDER — SIMVASTATIN 40 MG PO TABS
40.0000 mg | ORAL_TABLET | Freq: Every day | ORAL | Status: DC
  Administered 2024-03-14 – 2024-03-15 (×2): 40 mg via ORAL
  Filled 2024-03-14 (×2): qty 1

## 2024-03-14 MED ORDER — IPRATROPIUM-ALBUTEROL 0.5-2.5 (3) MG/3ML IN SOLN: 3.0000 mL | RESPIRATORY_TRACT | Status: DC | PRN

## 2024-03-14 MED ORDER — PANTOPRAZOLE SODIUM 40 MG PO TBEC: 40.0000 mg | DELAYED_RELEASE_TABLET | Freq: Every day | ORAL | Status: DC

## 2024-03-14 MED ORDER — IOHEXOL 350 MG/ML SOLN
75.0000 mL | Freq: Once | INTRAVENOUS | Status: AC | PRN
  Administered 2024-03-14: 75 mL via INTRAVENOUS

## 2024-03-14 MED ORDER — ONDANSETRON HCL 4 MG PO TABS: 4.0000 mg | ORAL_TABLET | Freq: Four times a day (QID) | ORAL | Status: DC | PRN

## 2024-03-14 MED ORDER — ACETAMINOPHEN 325 MG PO TABS: 650.0000 mg | ORAL_TABLET | Freq: Four times a day (QID) | ORAL | Status: DC | PRN

## 2024-03-14 MED ORDER — ESOMEPRAZOLE MAGNESIUM 20 MG PO CPDR: 40.0000 mg | DELAYED_RELEASE_CAPSULE | Freq: Every day | ORAL | Status: DC

## 2024-03-14 MED ORDER — ACETAMINOPHEN 650 MG RE SUPP: 650.0000 mg | Freq: Four times a day (QID) | RECTAL | Status: DC | PRN

## 2024-03-14 MED ORDER — ENOXAPARIN SODIUM 40 MG/0.4ML IJ SOSY
40.0000 mg | PREFILLED_SYRINGE | Freq: Every day | INTRAMUSCULAR | Status: DC
  Administered 2024-03-14 – 2024-03-15 (×2): 40 mg via SUBCUTANEOUS
  Filled 2024-03-14 (×2): qty 0.4

## 2024-03-14 MED ORDER — SODIUM CHLORIDE 0.9 % IV SOLN
2.0000 g | INTRAVENOUS | Status: DC
  Administered 2024-03-14 – 2024-03-15 (×2): 2 g via INTRAVENOUS
  Filled 2024-03-14 (×2): qty 20

## 2024-03-14 MED ORDER — PANTOPRAZOLE SODIUM 40 MG PO TBEC
40.0000 mg | DELAYED_RELEASE_TABLET | Freq: Every day | ORAL | Status: DC
  Administered 2024-03-15 – 2024-03-16 (×2): 40 mg via ORAL
  Filled 2024-03-14 (×2): qty 1

## 2024-03-14 MED ORDER — AZITHROMYCIN 250 MG PO TABS
500.0000 mg | ORAL_TABLET | Freq: Every day | ORAL | Status: DC
  Administered 2024-03-14 – 2024-03-15 (×2): 500 mg via ORAL
  Filled 2024-03-14 (×2): qty 2

## 2024-03-14 MED ORDER — HYDROCHLOROTHIAZIDE 25 MG PO TABS
25.0000 mg | ORAL_TABLET | Freq: Every day | ORAL | Status: DC
  Administered 2024-03-15 – 2024-03-16 (×2): 25 mg via ORAL
  Filled 2024-03-14 (×2): qty 1

## 2024-03-14 MED ORDER — FLUTICASONE FUROATE-VILANTEROL 200-25 MCG/ACT IN AEPB
1.0000 | INHALATION_SPRAY | Freq: Every day | RESPIRATORY_TRACT | Status: DC
  Administered 2024-03-15: 1 via RESPIRATORY_TRACT
  Filled 2024-03-14: qty 28

## 2024-03-14 MED ORDER — UMECLIDINIUM BROMIDE 62.5 MCG/ACT IN AEPB
1.0000 | INHALATION_SPRAY | Freq: Every day | RESPIRATORY_TRACT | Status: DC
  Administered 2024-03-15: 1 via RESPIRATORY_TRACT
  Filled 2024-03-14: qty 7

## 2024-03-14 MED ORDER — MONTELUKAST SODIUM 10 MG PO TABS
10.0000 mg | ORAL_TABLET | Freq: Every day | ORAL | Status: DC
  Administered 2024-03-14 – 2024-03-15 (×2): 10 mg via ORAL
  Filled 2024-03-14 (×3): qty 1

## 2024-03-14 MED ORDER — SODIUM CHLORIDE (PF) 0.9 % IJ SOLN
INTRAMUSCULAR | Status: AC
  Filled 2024-03-14: qty 50

## 2024-03-14 NOTE — ED Triage Notes (Signed)
 Pt arrived via POV. C/o SOB for 1x month. Has been PCP ,and been given abx and steroids w/no improvement.   Non-productive cough  Was 89% on RA, placed on 4L up to 93%

## 2024-03-14 NOTE — ED Provider Notes (Signed)
 [place] [facility]MENT AT [facility] Provider Note   CSN: [id number] Arrival date & time: 03/14/24  0932     History  Chief Complaint  Patient presents with   Shortness of Breath    Breanna Ramirez is a 68 y.o. female here with SOB. She had onset of URI sxs 1 month ago.  She has a history of COPD and pulmonary fibrosis.  Patient reports that she has had waxing and waning symptoms.  She has been treated twice with a round of steroids and antibiotics without improvement.  Patient is normally on 2 L at night via nasal cannula however states that over the last 2 days she has been wearing her oxygen during the day as well because she has been so markedly dyspneic with any exertion.  Patient reports that a month ago before her URI she can get around the house to do ADLs and chores but is now having significant difficulty doing any ADLs due to the exertional dyspnea..  Patient states that she also feels she has a lot of rhonchi in her chest that she is having difficulty clearing.  She denies chest pain abdominal swelling leg swelling.   Shortness of Breath      Home Medications Prior to Admission medications   Medication Sig Start Date End Date Taking? Authorizing Provider  albuterol (PROVENTIL HFA;VENTOLIN HFA) 108 (90 Base) MCG/ACT inhaler Inhale 2 puffs into the lungs every 6 (six) hours as needed for wheezing or shortness of breath.   Yes [provider]  amoxicillin-clavulanate (AUGMENTIN) 875-125 MG tablet SMARTSIG:1 Tablet(s) By Mouth Every 12 Hours 03/09/24  Yes [provider]  esomeprazole (NEXIUM) 40 MG capsule Take 1 capsule (40 mg total) by mouth daily. 09/13/14  Yes Hawks, Christy A, FNP  fluticasone (FLONASE) 50 MCG/ACT nasal spray Place 1 spray into both nostrils daily. 09/30/21  Yes [provider]  Fluticasone-Umeclidin-Vilant (TRELEGY ELLIPTA) 200-62.5-25 MCG/ACT AEPB Inhale 1 puff into the lungs daily. 08/25/22  Yes Martina Sinner, MD  hydrochlorothiazide (HYDRODIURIL) 25 MG tablet Take 1 tablet (25 mg total) by mouth daily. 09/13/14  Yes Hawks, Christy A, FNP  ipratropium-albuterol (DUONEB) 0.5-2.5 (3) MG/3ML SOLN SMARTSIG:1 Ampule(s) Via Nebulizer 3 Times Daily PRN Patient taking differently: Take 3 mLs by nebulization as needed (wheezing/SOB). 02/12/22  Yes Martina Sinner, MD  montelukast (SINGULAIR) 10 MG tablet Take 1 tablet (10 mg total) by mouth at bedtime. 08/25/22  Yes Martina Sinner, MD  predniSONE (DELTASONE) 20 MG tablet Take 40 mg by mouth daily. 03/09/24  Yes [provider]  simvastatin (ZOCOR) 40 MG tablet Take 1 tablet (40 mg total) by mouth at bedtime. 09/15/14  Yes Hawks, Christy A, FNP  sodium chloride HYPERTONIC 3 % nebulizer solution Take 4 mLs by nebulization 3 (three) times daily. Patient not taking: Reported on 03/14/2024 09/24/23   Jonah Blue, MD      Allergies    Codeine    Review of Systems   Review of Systems  Respiratory:  Positive for shortness of breath.     Physical Exam Updated Vital Signs BP (!) 98/48   Pulse 72   Temp 97.9 F (36.6 C) (Oral)   Resp 16   Ht 5\' 5"  (1.651 m)   Wt 59 kg   SpO2 96%   BMI 21.63 kg/m  Physical Exam Vitals and nursing note reviewed.  Constitutional:      General: She is not in acute distress.    Appearance:  She is well-developed. She is not diaphoretic.     Comments: Patient on nasal cannula 2 L with 97% O2 saturation  HENT:     Head: Normocephalic and atraumatic.     Right Ear: External ear normal.     Left Ear: External ear normal.     Nose: Nose normal.     Mouth/Throat:     Mouth: Mucous membranes are moist.  Eyes:     General: No scleral icterus.    Conjunctiva/sclera: Conjunctivae normal.  Cardiovascular:     Rate and Rhythm: Normal rate and regular rhythm.     Heart sounds: Normal heart sounds. No murmur heard.    No friction rub. No gallop.  Pulmonary:     Effort: Pulmonary effort is normal. No  respiratory distress.     Breath sounds: Wheezing and rhonchi present. No decreased breath sounds.     Comments: Normal movement of air, mild inspiratory wheezing in all lung fields.  Diffuse rhonchi throughout Abdominal:     General: Bowel sounds are normal. There is no distension.     Palpations: Abdomen is soft. There is no mass.     Tenderness: There is no abdominal tenderness. There is no guarding.  Musculoskeletal:     Cervical back: Normal range of motion.  Skin:    General: Skin is warm and dry.  Neurological:     Mental Status: She is alert and oriented to person, place, and time.  Psychiatric:        Behavior: Behavior normal.     ED Results / Procedures / Treatments   Labs (all labs ordered are listed, but only abnormal results are displayed) Labs Reviewed - No data to display  EKG None  Radiology DG Chest 2 View Result Date: 03/14/2024 CLINICAL DATA:  Cough and shortness of breath for 1 month. EXAM: CHEST - 2 VIEW COMPARISON:  09/21/2023. FINDINGS: Bilateral lungs appear hyperexpanded and hyperlucent with coarse bronchovascular markings, in keeping with COPD. There is a linear area of atelectasis/scarring in the middle lobe. There are nonspecific opacities overlying the left mid lung zone and left retrocardiac region, which may represent atelectasis/scarring versus pneumonitis. Correlate clinically. Bilateral lungs otherwise appear clear. Bilateral costophrenic angles are clear. Normal cardio-mediastinal silhouette. No acute osseous abnormalities. The soft tissues are within normal limits. IMPRESSION: *COPD. *Nonspecific opacities overlying the left mid lung zone and left retrocardiac region, which may represent atelectasis/scarring versus pneumonitis. *Linear area of atelectasis/scarring in the middle lobe. Electronically Signed   By: Jules Schick M.D.   On: 03/14/2024 10:19    Procedures .Critical Care  Performed by: Arthor Captain, PA-C Authorized by: Arthor Captain, PA-C   Critical care provider statement:    Critical care time (minutes):  30   Critical care time was exclusive of:  Separately billable procedures and treating other patients   Critical care was necessary to treat or prevent imminent or life-threatening deterioration of the following conditions:  Respiratory failure   Critical care was time spent personally by me on the following activities:  Development of treatment plan with patient or surrogate, discussions with consultants, evaluation of patient's response to treatment, examination of patient, ordering and review of laboratory studies, ordering and review of radiographic studies, ordering and performing treatments and interventions, pulse oximetry, re-evaluation of patient's condition and review of old charts     Medications Ordered in ED Medications  albuterol (VENTOLIN HFA) 108 (90 Base) MCG/ACT inhaler 2 puff (has no administration in time range)  ED Course/ Medical Decision Making/ A&P                                 Medical Decision Making Amount and/or Complexity of Data Reviewed Labs: ordered. Radiology: ordered.  Risk Prescription drug management. Decision regarding hospitalization.   This patient presents to the ED with chief complaint(s) of shortness of breath with pertinent past medical history of COPD and pulmonary fibrosis which further complicates the presenting complaint. The complaint involves an extensive differential diagnosis and treatment options and also carries with it a high risk of complications and morbidity.    The differential diagnosis includes The emergent differential diagnosis for shortness of breath includes, but is not limited to, Pulmonary edema, bronchoconstriction, Pneumonia, Pulmonary embolism, Pneumotherax/ Hemothorax, Dysrythmia, ACS.     The initial plan is to order labs and imaging    Reassessment and review (also see workup area): Lab Tests: I Ordered, and personally  interpreted labs.  The pertinent results include: Labs reviewed.  Hemoglobin 10.8 appears to be baseline.  BNP within normal limits.  CMP shows no acute findings.   Imaging Studies: I ordered and independently visualized and interpreted the following imaging CT scan angiogram of the chest   which showed acute bibasilar consolidations consistent with atypical pneumonia   The interpretation of the imaging was limited to assessing for emergent pathology, for which purpose it was ordered.  Consultations Obtained: I requested consultation with the admitting physician Dr. Antionette Char , and discussed  findings as well as pertinent plan - they recommend: Admission  Medicines ordered and prescription drug management: I ordered the following medications cefepime and vancomycin for pneumonia    Cardiac Monitoring: The patient was maintained on a cardiac monitor.  I personally viewed and interpreted the cardiac monitor which showed an underlying rhythm of:  sinus rhythm  Complexity of problems addressed: Patient's presentation is most consistent with  acute presentation with potential threat to life or bodily function During patient's assessment  Disposition: After consideration of the diagnostic results and the patient's response to treatment,  I feel that the patent would benefit from admission for acute on chronic hypoxic respiratory failure in the setting of multifocal pneumonia .          Final Clinical Impression(s) / ED Diagnoses Final diagnoses:  None    Rx / DC Orders ED Discharge Orders     None         Arthor Captain, PA-C 03/14/24 1852    Tegeler, Canary Brim, MD 03/15/24 0930

## 2024-03-14 NOTE — H&P (Signed)
 History and Physical    Breanna Ramirez WNU:[id number] DOB: 1956/07/05 DOA: 03/14/2024  PCP: Salli Real, MD   Patient coming from: Home   Chief Complaint: SOB  HPI: Breanna Ramirez is a 68 y.o. female with medical history significant for COPD, nocturnal supplemental O2 requirement, HTN, and GERD who presents with SOB.   Patient reports developing increased shortness of breath and nonproductive cough roughly 1 month ago.  She has been treated with steroids and antibiotics, felt that she made some improvement with this initially, but then worsened again.  She denies fevers, chest pain, or leg swelling associated with this.  Her symptoms do not improve with DuoNeb or albuterol at home.  ED Course: Upon arrival to the ED, patient is found to be afebrile and saturating well on 3 L/min of supplemental oxygen with mild tachypnea, normal heart rate, and stable blood pressure.  Labs are most notable for normal creatinine, normal WBC, and normal BNP.  CTA chest is negative for PE but concerning for multifocal pneumonia, possibly atypical or MAC.   She was treated in the ED with supplemental oxygen, vancomycin, and cefepime.  Review of Systems:  All other systems reviewed and apart from HPI, are negative.  Past Medical History:  Diagnosis Date   COPD (chronic obstructive pulmonary disease) (HCC)    Essential hypertension    GERD (gastroesophageal reflux disease)    Pneumonia 05/2017   Respiratory failure with hypoxia (HCC) 05/2017    Past Surgical History:  Procedure Laterality Date   ENDOMETRIAL ABLATION      Social History:   reports that she quit smoking about 6 years ago. Her smoking use included cigarettes. She started smoking about 51 years ago. She has a 45 pack-year smoking history. She has never used smokeless tobacco. She reports that she does not drink alcohol and does not use drugs.  Allergies  Allergen Reactions   Codeine Nausea And Vomiting    Family History  Problem  Relation Age of Onset   Cancer Father      Prior to Admission medications   Medication Sig Start Date End Date Taking? Authorizing Provider  albuterol (PROVENTIL HFA;VENTOLIN HFA) 108 (90 Base) MCG/ACT inhaler Inhale 2 puffs into the lungs every 6 (six) hours as needed for wheezing or shortness of breath.   Yes [provider]  amoxicillin-clavulanate (AUGMENTIN) 875-125 MG tablet SMARTSIG:1 Tablet(s) By Mouth Every 12 Hours 03/09/24  Yes [provider]  esomeprazole (NEXIUM) 40 MG capsule Take 1 capsule (40 mg total) by mouth daily. 09/13/14  Yes Hawks, Christy A, FNP  fluticasone (FLONASE) 50 MCG/ACT nasal spray Place 1 spray into both nostrils daily. 09/30/21  Yes [provider]  Fluticasone-Umeclidin-Vilant (TRELEGY ELLIPTA) 200-62.5-25 MCG/ACT AEPB Inhale 1 puff into the lungs daily. 08/25/22  Yes Martina Sinner, MD  hydrochlorothiazide (HYDRODIURIL) 25 MG tablet Take 1 tablet (25 mg total) by mouth daily. 09/13/14  Yes Hawks, Christy A, FNP  ipratropium-albuterol (DUONEB) 0.5-2.5 (3) MG/3ML SOLN SMARTSIG:1 Ampule(s) Via Nebulizer 3 Times Daily PRN Patient taking differently: Take 3 mLs by nebulization as needed (wheezing/SOB). 02/12/22  Yes Martina Sinner, MD  montelukast (SINGULAIR) 10 MG tablet Take 1 tablet (10 mg total) by mouth at bedtime. 08/25/22  Yes Martina Sinner, MD  simvastatin (ZOCOR) 40 MG tablet Take 1 tablet (40 mg total) by mouth at bedtime. 09/15/14  Yes Hawks, Christy A, FNP  sodium chloride HYPERTONIC 3 % nebulizer solution Take 4 mLs by nebulization 3 (three) times  daily. Patient not taking: Reported on 03/14/2024 09/24/23   Jonah Blue, MD    Physical Exam: Vitals:   03/14/24 0944 03/14/24 0946 03/14/24 1300 03/14/24 1415  BP:   (!) 98/48 122/66  Pulse:  78 72 65  Resp:  (!) 25 16 18   Temp:    98 F (36.7 C)  TempSrc:    Oral  SpO2:  93% 96% 97%  Weight: 59 kg     Height: 5\' 5"  (1.651 m)       Constitutional: NAD, calm   Eyes: PERTLA, lids and conjunctivae normal ENMT: Mucous membranes are moist. Posterior pharynx clear of any exudate or lesions.   Neck: supple, no masses  Respiratory: Fine rales bilaterally. Mild tachypnea. Speaking full sentences.    Cardiovascular: S1 & S2 heard, regular rate and rhythm. No extremity edema.  Abdomen: No tenderness, soft. Bowel sounds active.  Musculoskeletal: no clubbing / cyanosis. No joint deformity upper and lower extremities.   Skin: no significant rashes, lesions, ulcers. Warm, dry, well-perfused. Neurologic: CN 2-12 grossly intact. Moving all extremities. Alert and oriented.  Psychiatric: Pleasant. Cooperative.    Labs and Imaging on Admission: I have personally reviewed following labs and imaging studies  CBC: Recent Labs  Lab 03/14/24 1352  WBC 6.4  NEUTROABS 5.8  HGB 10.8*  HCT 34.7*  MCV 89.4  PLT 261   Basic Metabolic Panel: Recent Labs  Lab 03/14/24 1352  NA 135  K 4.2  CL 98  CO2 28  GLUCOSE 154*  BUN 13  CREATININE 0.76  CALCIUM 9.5   GFR: Estimated Creatinine Clearance: 60.6 mL/min (by C-G formula based on SCr of 0.76 mg/dL). Liver Function Tests: Recent Labs  Lab 03/14/24 1352  AST 16  ALT 10  ALKPHOS 55  BILITOT 0.3  PROT 6.6  ALBUMIN 3.1*   No results for input(s): "LIPASE", "AMYLASE" in the last 168 hours. No results for input(s): "AMMONIA" in the last 168 hours. Coagulation Profile: No results for input(s): "INR", "PROTIME" in the last 168 hours. Cardiac Enzymes: No results for input(s): "CKTOTAL", "CKMB", "CKMBINDEX", "TROPONINI" in the last 168 hours. BNP (last 3 results) No results for input(s): "PROBNP" in the last 8760 hours. HbA1C: No results for input(s): "HGBA1C" in the last 72 hours. CBG: No results for input(s): "GLUCAP" in the last 168 hours. Lipid Profile: No results for input(s): "CHOL", "HDL", "LDLCALC", "TRIG", "CHOLHDL", "LDLDIRECT" in the last 72 hours. Thyroid Function Tests: No results for  input(s): "TSH", "T4TOTAL", "FREET4", "T3FREE", "THYROIDAB" in the last 72 hours. Anemia Panel: No results for input(s): "VITAMINB12", "FOLATE", "FERRITIN", "TIBC", "IRON", "RETICCTPCT" in the last 72 hours. Urine analysis: No results found for: "COLORURINE", "APPEARANCEUR", "LABSPEC", "PHURINE", "GLUCOSEU", "HGBUR", "BILIRUBINUR", "KETONESUR", "PROTEINUR", "UROBILINOGEN", "NITRITE", "LEUKOCYTESUR" Sepsis Labs: @LABRCNTIP (procalcitonin:4,lacticidven:4) )No results found for this or any previous visit (from the past 240 hours).   Radiological Exams on Admission: CT Angio Chest Pulmonary Embolism (PE) W or WO Contrast Result Date: 03/14/2024 CLINICAL DATA:  One-month history of shortness of breath, not improving with antibiotics and steroids. Associated cough EXAM: CT ANGIOGRAPHY CHEST WITH CONTRAST TECHNIQUE: Multidetector CT imaging of the chest was performed using the standard protocol during bolus administration of intravenous contrast. Multiplanar CT image reconstructions and MIPs were obtained to evaluate the vascular anatomy. RADIATION DOSE REDUCTION: This exam was performed according to the departmental dose-optimization program which includes automated exposure control, adjustment of the mA and/or kV according to patient size and/or use of iterative reconstruction technique. CONTRAST:  75mL OMNIPAQUE IOHEXOL  350 MG/ML SOLN COMPARISON:  Same day chest radiograph, CT chest dated 12/17/2023 FINDINGS: Cardiovascular: The study is high quality for the evaluation of pulmonary embolism. There are no filling defects in the central, lobar, segmental or subsegmental pulmonary artery branches to suggest acute pulmonary embolism. Great vessels are normal in course and caliber. Normal heart size. No significant pericardial fluid/thickening. Coronary artery calcifications and aortic atherosclerosis. Mediastinum/Nodes: Imaged thyroid gland without nodules meeting criteria for imaging follow-up by size. Normal  esophagus. 11 mm right hilar lymph node (5:88) Lungs/Pleura: The central airways are patent. Trace layering secretions within the trachea with bibasilar subsegmental mucous plugging. Similar moderate upper lobe predominant centrilobular emphysema. Mild diffuse interlobular septal thickening. Similar lingular and right middle lobe atelectasis/scarring with traction bronchiectasis. Slightly increased bibasilar irregular consolidations, for example medial right lower lobe (13:124) as well as new areas subpleural ground-glass opacity, for example posterior left lower lobe (13:96). Similar to increased bandlike linear atelectasis/scarring. No pneumothorax. No pleural effusion. Upper abdomen: Normal. Musculoskeletal: No acute or abnormal lytic or blastic osseous lesions. Review of the MIP images confirms the above findings. IMPRESSION: 1. No evidence of pulmonary embolism. 2. Slightly increased bibasilar irregular consolidations and new areas of subpleural ground-glass opacity, which may reflect multifocal pneumonia, including atypical etiologies, including mycobacterium avium complex in the setting of lingular and right middle lobe atelectasis/scarring with traction bronchiectasis. 3. Mild diffuse interlobular septal thickening, which may be due to pulmonary edema or atypical infection. 4. Enlarged right hilar lymph node, likely reactive. 5. Aortic Atherosclerosis (ICD10-I70.0) and Emphysema (ICD10-J43.9). Coronary artery calcifications. Assessment for potential risk factor modification, dietary therapy or pharmacologic therapy may be warranted, if clinically indicated. Electronically Signed   By: Agustin Cree M.D.   On: 03/14/2024 16:47   DG Chest 2 View Result Date: 03/14/2024 CLINICAL DATA:  Cough and shortness of breath for 1 month. EXAM: CHEST - 2 VIEW COMPARISON:  09/21/2023. FINDINGS: Bilateral lungs appear hyperexpanded and hyperlucent with coarse bronchovascular markings, in keeping with COPD. There is a linear  area of atelectasis/scarring in the middle lobe. There are nonspecific opacities overlying the left mid lung zone and left retrocardiac region, which may represent atelectasis/scarring versus pneumonitis. Correlate clinically. Bilateral lungs otherwise appear clear. Bilateral costophrenic angles are clear. Normal cardio-mediastinal silhouette. No acute osseous abnormalities. The soft tissues are within normal limits. IMPRESSION: *COPD. *Nonspecific opacities overlying the left mid lung zone and left retrocardiac region, which may represent atelectasis/scarring versus pneumonitis. *Linear area of atelectasis/scarring in the middle lobe. Electronically Signed   By: Jules Schick M.D.   On: 03/14/2024 10:19    EKG: Independently reviewed. Sinus rhythm.   Assessment/Plan  1. Acute on chronic respiratory failure; pneumonia  - Check respiratory virus panel, sputum culture, and AFB smear and culture qAM x3, treat with Rocephin and azithromycin for now, continue supplemental O2 as-needed    2. COPD  - Not in exacerbation  - Continue ICS-LAMA-LABA and short-acting bronchodilators as-needed   3. Chronic HFpEF  - Appears euvolemic    4. HTN  - Hydrochlorothiazide     DVT prophylaxis: Lovenox  Code Status: Full  Level of Care: Level of care: Med-Surg Family Communication: None present  Disposition Plan:  Patient is from: Home  Anticipated d/c is to: Home  Anticipated d/c date is: 03/17/24  Patient currently: Pending further workup, improved oxygenation  Consults called: None  Admission status: Inpatient     Briscoe Deutscher, MD [facility]  03/14/2024, 6:26 PM

## 2024-03-15 DIAGNOSIS — J9621 Acute and chronic respiratory failure with hypoxia: Secondary | ICD-10-CM

## 2024-03-15 DIAGNOSIS — J189 Pneumonia, unspecified organism: Secondary | ICD-10-CM

## 2024-03-15 DIAGNOSIS — J441 Chronic obstructive pulmonary disease with (acute) exacerbation: Secondary | ICD-10-CM

## 2024-03-15 LAB — RESPIRATORY PANEL BY PCR

## 2024-03-15 LAB — BASIC METABOLIC PANEL
Anion gap: 10 (ref 5–15)
BUN: 11 mg/dL (ref 8–23)
CO2: 28 mmol/L (ref 22–32)
Calcium: 9.3 mg/dL (ref 8.9–10.3)
Chloride: 100 mmol/L (ref 98–111)
Creatinine, Ser: 0.73 mg/dL (ref 0.44–1.00)
GFR, Estimated: >60 mL/min (ref >60)
Glucose, Bld: 85 mg/dL (ref 70–99)
Potassium: 3.5 mmol/L (ref 3.5–5.1)
Sodium: 138 mmol/L (ref 135–145)

## 2024-03-15 LAB — CBC
HCT: 36.1 % (ref 36.0–46.0)
Hemoglobin: 10.7 g/dL — ABNORMAL LOW (ref 12.0–15.0)
MCH: 27 pg (ref 26.0–34.0)
MCHC: 29.6 g/dL — ABNORMAL LOW (ref 30.0–36.0)
MCV: 91.2 fL (ref 80.0–100.0)
Platelets: 284 10*3/uL (ref 150–400)
RBC: 3.96 MIL/uL (ref 3.87–5.11)
RDW: 15.3 % (ref 11.5–15.5)
WBC: 7.8 10*3/uL (ref 4.0–10.5)
nRBC: 0 % (ref 0.0–0.2)

## 2024-03-15 LAB — STREP PNEUMONIAE URINARY ANTIGEN: Strep Pneumo Urinary Antigen: NEGATIVE

## 2024-03-15 MED ORDER — METHYLPREDNISOLONE SODIUM SUCC 125 MG IJ SOLR
125.0000 mg | Freq: Once | INTRAMUSCULAR | Status: AC
Start: 2024-03-15 — End: 2024-03-15
  Administered 2024-03-15: 125 mg via INTRAVENOUS
  Filled 2024-03-15: qty 2

## 2024-03-15 MED ORDER — METHYLPREDNISOLONE SODIUM SUCC 125 MG IJ SOLR
60.0000 mg | Freq: Every day | INTRAMUSCULAR | Status: DC
  Administered 2024-03-16: 60 mg via INTRAVENOUS
  Filled 2024-03-15: qty 2

## 2024-03-15 MED ORDER — IPRATROPIUM-ALBUTEROL 0.5-2.5 (3) MG/3ML IN SOLN
3.0000 mL | Freq: Four times a day (QID) | RESPIRATORY_TRACT | Status: DC
  Administered 2024-03-15 – 2024-03-16 (×6): 3 mL via RESPIRATORY_TRACT
  Filled 2024-03-15 (×6): qty 3

## 2024-03-15 MED ORDER — BUDESONIDE 0.25 MG/2ML IN SUSP
0.2500 mg | Freq: Two times a day (BID) | RESPIRATORY_TRACT | Status: DC
  Administered 2024-03-15 – 2024-03-16 (×3): 0.25 mg via RESPIRATORY_TRACT
  Filled 2024-03-15 (×3): qty 2

## 2024-03-15 MED ORDER — ARFORMOTEROL TARTRATE 15 MCG/2ML IN NEBU
15.0000 ug | INHALATION_SOLUTION | Freq: Two times a day (BID) | RESPIRATORY_TRACT | Status: DC
  Administered 2024-03-15 – 2024-03-16 (×3): 15 ug via RESPIRATORY_TRACT
  Filled 2024-03-15 (×3): qty 2

## 2024-03-15 NOTE — Hospital Course (Signed)
 Breanna Ramirez is a 68 year old female with PMH COPD on nocturnal oxygen, HTN, GERD who presented with lingering cough and shortness of breath.  She has had symptoms for approximately 1 month.  She has been on 2 courses of antibiotics and steroids finishing prednisone approximately 1 week prior to admission. She was found to be tachypneic with increased work of breathing and was placed on oxygen in the ER. CT angio chest was negative for PE and showed bibasilar irregular consolidations with subpleural groundglass opacities.  She was started on antibiotics and admitted for workup of atypical pneumonia.

## 2024-03-15 NOTE — Assessment & Plan Note (Signed)
-   Presumed due to findings noted on CT angio chest - On 2 L nocturnal oxygen at home - Continue titrating down as able

## 2024-03-15 NOTE — Assessment & Plan Note (Signed)
-   Follows outpatient with pulmonology - See exacerbation

## 2024-03-15 NOTE — Plan of Care (Signed)

## 2024-03-15 NOTE — Assessment & Plan Note (Signed)
-   No signs/symptoms of exacerbation - Last echo 09/22/2023: EF 70 to 75%, no RWMA, mild LVH, grade 1 diastolic dysfunction

## 2024-03-15 NOTE — Assessment & Plan Note (Signed)
-

## 2024-03-15 NOTE — Assessment & Plan Note (Signed)
-   CT angio chest concerning for atypical pneumonia possibly including MAC; notes GGO and bibasilar irregular consolidations -Discussed with pulmonology; no need for inpatient consult at this time but they will follow-up outpatient after discharge; okay to obtain AFB sputum but does not need to be on airborne precautions they will follow-up results outpatient -Continue treating for pneumonia with Rocephin and azithromycin for now along with COPD exacerbation

## 2024-03-15 NOTE — Progress Notes (Signed)
 Mobility Specialist - Progress Note   03/15/24 1224  Oxygen Therapy  SpO2 (!) 88 %  O2 Device Nasal Cannula  O2 Flow Rate (L/min) 4 L/min  Patient Activity (if Appropriate) Ambulating  Mobility  Activity Ambulated independently in hallway  Level of Assistance Independent  Assistive Device None  Distance Ambulated (ft) 230 ft  Activity Response Tolerated well  Mobility Referral Yes  Mobility visit 1 Mobility  Mobility Specialist Start Time (ACUTE ONLY) 1215  Mobility Specialist Stop Time (ACUTE ONLY) 1222  Mobility Specialist Time Calculation (min) (ACUTE ONLY) 7 min   Pt received in bed and agreeable to mobility. During ambulation, pt desat to 88%. Encouraged pursed lip breaths bringing SpO2 to 90%. No complaints during session. Pt to bed after session with all needs met.   Pre-mobility: 85 HR, 93% SpO2 (4L [place]) During mobility: 105 HR, 88-90% SpO2 (4L [place]) Post-mobility: 90 HR, 90% SPO2 (4L [place])  Chief Technology Officer

## 2024-03-15 NOTE — Assessment & Plan Note (Signed)
 Continue Zocor

## 2024-03-15 NOTE — Assessment & Plan Note (Signed)
-   Presumed due to underlying pneumonia - Start on Pulmicort, Brovana, DuoNebs - Continue Solu-Medrol

## 2024-03-15 NOTE — Progress Notes (Signed)
 Progress Note    Breanna Ramirez   WUJ:[id number]  DOB: Apr 19, 1956  DOA: 03/14/2024     1 PCP: Salli Real, MD  Initial CC: Shortness of breath, cough  Hospital Course: Breanna Ramirez is a 68 year old female with PMH COPD on nocturnal oxygen, HTN, GERD who presented with lingering cough and shortness of breath.  She has had symptoms for approximately 1 month.  She has been on 2 courses of antibiotics and steroids finishing prednisone approximately 1 week prior to admission. She was found to be tachypneic with increased work of breathing and was placed on oxygen in the ER. CT angio chest was negative for PE and showed bibasilar irregular consolidations with subpleural groundglass opacities.  She was started on antibiotics and admitted for workup of atypical pneumonia.  Interval History:  Resting in bed in no distress.  Still has ongoing significant amount of wheezing and decreased air movement.  She is amenable with trial of nebulizers and ongoing steroids today.  Assessment and Plan: * Acute and chronic respiratory failure with hypoxia (HCC) - Presumed due to findings noted on CT angio chest - On 2 L nocturnal oxygen at home - Continue titrating down as able  Pneumonia - CT angio chest concerning for atypical pneumonia possibly including MAC; notes GGO and bibasilar irregular consolidations -Discussed with pulmonology; no need for inpatient consult at this time but they will follow-up outpatient after discharge; okay to obtain AFB sputum but does not need to be on airborne precautions they will follow-up results outpatient -Continue treating for pneumonia with Rocephin and azithromycin for now along with COPD exacerbation  COPD with acute exacerbation (HCC) - Presumed due to underlying pneumonia - Start on Pulmicort, Brovana, DuoNebs - Continue Solu-Medrol  COPD (chronic obstructive pulmonary disease) (HCC) - Follows outpatient with pulmonology - See exacerbation  Chronic diastolic CHF  (congestive heart failure) (HCC) - No signs/symptoms of exacerbation - Last echo 09/22/2023: EF 70 to 75%, no RWMA, mild LVH, grade 1 diastolic dysfunction  Hyperlipidemia - Continue Zocor  G E R D - Continue Protonix   Old records reviewed in assessment of this patient  Antimicrobials: Rocephin 03/14/2024 >> current Azithromycin 03/14/2024 >> current  DVT prophylaxis:  enoxaparin (LOVENOX) injection 40 mg Start: 03/14/24 2200   Code Status:   Code Status: Full Code  Mobility Assessment (Last 72 Hours)     Mobility Assessment     Row Name 03/15/24 0800 03/14/24 2300         Does patient have an order for bedrest or is patient medically unstable No - Continue assessment No - Continue assessment      What is the highest level of mobility based on the progressive mobility assessment? Level 6 (Walks independently in room and hall) - Balance while walking in room without assist - Complete Level 6 (Walks independently in room and hall) - Balance while walking in room without assist - Complete               Barriers to discharge: None Disposition Plan: Home HH orders placed: N/A Status is: Inpatient  Objective: Blood pressure (!) 110/55, pulse 84, temperature 98.1 F (36.7 C), resp. rate 19, height 5\' 5"  (1.651 m), weight 59 kg, SpO2 93%.  Examination:  Physical Exam Constitutional:      Appearance: Normal appearance.  HENT:     Head: Normocephalic and atraumatic.     Mouth/Throat:     Mouth: Mucous membranes are moist.  Eyes:     Extraocular  Movements: Extraocular movements intact.  Cardiovascular:     Rate and Rhythm: Normal rate and regular rhythm.  Pulmonary:     Effort: Pulmonary effort is normal. No respiratory distress.     Breath sounds: Wheezing and rhonchi present.     Comments: Diffuse coarse breath sounds bilaterally Abdominal:     General: Bowel sounds are normal. There is no distension.     Palpations: Abdomen is soft.     Tenderness: There is no  abdominal tenderness.  Musculoskeletal:        General: Normal range of motion.     Cervical back: Normal range of motion and neck supple.  Skin:    General: Skin is warm and dry.  Neurological:     General: No focal deficit present.     Mental Status: She is alert.  Psychiatric:        Mood and Affect: Mood normal.      Consultants:    Procedures:    Data Reviewed: Results for orders placed or performed during the hospital encounter of 03/14/24 (from the past 24 hours)  Brain natriuretic peptide     Status: None   Collection Time: 03/14/24  1:52 PM  Result Value Ref Range   B Natriuretic Peptide 66.5 0.0 - 100.0 pg/mL  CBC with Differential     Status: Abnormal   Collection Time: 03/14/24  1:52 PM  Result Value Ref Range   WBC 6.4 4.0 - 10.5 K/uL   RBC 3.88 3.87 - 5.11 MIL/uL   Hemoglobin 10.8 (L) 12.0 - 15.0 g/dL   HCT 75.6 (L) 43.3 - 29.5 %   MCV 89.4 80.0 - 100.0 fL   MCH 27.8 26.0 - 34.0 pg   MCHC 31.1 30.0 - 36.0 g/dL   RDW 18.8 (H) 41.6 - 60.6 %   Platelets 261 150 - 400 K/uL   nRBC 0.0 0.0 - 0.2 %   Neutrophils Relative % 90 %   Neutro Abs 5.8 1.7 - 7.7 K/uL   Lymphocytes Relative 6 %   Lymphs Abs 0.4 (L) 0.7 - 4.0 K/uL   Monocytes Relative 3 %   Monocytes Absolute 0.2 0.1 - 1.0 K/uL   Eosinophils Relative 0 %   Eosinophils Absolute 0.0 0.0 - 0.5 K/uL   Basophils Relative 0 %   Basophils Absolute 0.0 0.0 - 0.1 K/uL   Immature Granulocytes 1 %   Abs Immature Granulocytes 0.04 0.00 - 0.07 K/uL  Comprehensive metabolic panel     Status: Abnormal   Collection Time: 03/14/24  1:52 PM  Result Value Ref Range   Sodium 135 135 - 145 mmol/L   Potassium 4.2 3.5 - 5.1 mmol/L   Chloride 98 98 - 111 mmol/L   CO2 28 22 - 32 mmol/L   Glucose, Bld 154 (H) 70 - 99 mg/dL   BUN 13 8 - 23 mg/dL   Creatinine, Ser 3.01 0.44 - 1.00 mg/dL   Calcium 9.5 8.9 - 60.1 mg/dL   Total Protein 6.6 6.5 - 8.1 g/dL   Albumin 3.1 (L) 3.5 - 5.0 g/dL   AST 16 15 - 41 U/L   ALT 10 0  - 44 U/L   Alkaline Phosphatase 55 38 - 126 U/L   Total Bilirubin 0.3 0.0 - 1.2 mg/dL   GFR, Estimated >09 >32 mL/min   Anion gap 9 5 - 15  Respiratory (~20 pathogens) panel by PCR     Status: None   Collection Time: 03/14/24  6:16 PM  Specimen: Nasopharyngeal Swab; Respiratory  Result Value Ref Range   Adenovirus NOT DETECTED NOT DETECTED   Coronavirus 229E NOT DETECTED NOT DETECTED   Coronavirus HKU1 NOT DETECTED NOT DETECTED   Coronavirus NL63 NOT DETECTED NOT DETECTED   Coronavirus OC43 NOT DETECTED NOT DETECTED   Metapneumovirus NOT DETECTED NOT DETECTED   Rhinovirus / Enterovirus NOT DETECTED NOT DETECTED   Influenza A NOT DETECTED NOT DETECTED   Influenza B NOT DETECTED NOT DETECTED   Parainfluenza Virus 1 NOT DETECTED NOT DETECTED   Parainfluenza Virus 2 NOT DETECTED NOT DETECTED   Parainfluenza Virus 3 NOT DETECTED NOT DETECTED   Parainfluenza Virus 4 NOT DETECTED NOT DETECTED   Respiratory Syncytial Virus NOT DETECTED NOT DETECTED   Bordetella pertussis NOT DETECTED NOT DETECTED   Bordetella Parapertussis NOT DETECTED NOT DETECTED   Chlamydophila pneumoniae NOT DETECTED NOT DETECTED   Mycoplasma pneumoniae NOT DETECTED NOT DETECTED  Basic metabolic panel     Status: None   Collection Time: 03/15/24  5:26 AM  Result Value Ref Range   Sodium 138 135 - 145 mmol/L   Potassium 3.5 3.5 - 5.1 mmol/L   Chloride 100 98 - 111 mmol/L   CO2 28 22 - 32 mmol/L   Glucose, Bld 85 70 - 99 mg/dL   BUN 11 8 - 23 mg/dL   Creatinine, Ser 4.09 0.44 - 1.00 mg/dL   Calcium 9.3 8.9 - 81.1 mg/dL   GFR, Estimated >91 >47 mL/min   Anion gap 10 5 - 15  CBC     Status: Abnormal   Collection Time: 03/15/24  5:26 AM  Result Value Ref Range   WBC 7.8 4.0 - 10.5 K/uL   RBC 3.96 3.87 - 5.11 MIL/uL   Hemoglobin 10.7 (L) 12.0 - 15.0 g/dL   HCT 82.9 56.2 - 13.0 %   MCV 91.2 80.0 - 100.0 fL   MCH 27.0 26.0 - 34.0 pg   MCHC 29.6 (L) 30.0 - 36.0 g/dL   RDW 86.5 78.4 - 69.6 %   Platelets 284 150  - 400 K/uL   nRBC 0.0 0.0 - 0.2 %  Strep pneumoniae urinary antigen     Status: None   Collection Time: 03/15/24  6:59 AM  Result Value Ref Range   Strep Pneumo Urinary Antigen NEGATIVE NEGATIVE    I have reviewed pertinent nursing notes, vitals, labs, and images as necessary. I have ordered labwork to follow up on as indicated.  I have reviewed the last notes from staff over past 24 hours. I have discussed patient's care plan and test results with nursing staff, CM/SW, and other staff as appropriate.  Time spent: Greater than 50% of the 55 minute visit was spent in counseling/coordination of care for the patient as laid out in the A&P.   LOS: 1 day   Lewie Chamber, MD [facility] 03/15/2024, 12:14 PM

## 2024-03-15 NOTE — TOC Initial Note (Signed)
 Transition of Care (TOC) - Initial/Assessment Note  Patient presented for simple pneumonia.PTA patient lived alone in an apartment and independent. Patient drives to appointments. PCP and Insurance verified. DME is oxygen at night with [employer].   At discharge, patients sister Justin Mend [phone]) will transport home via private vehicle. Case Manager will continue to follow for progression to discharge and oxygen needs.  Patient Details  Name: Breanna Ramirez MRN: [id number] Date of Birth: June 10, 1956  Transition of Care [facility]) CM/SW Contact:    Jessie Foot, RN Phone Number: 03/15/2024, 12:17 PM  Clinical Narrative:                   Expected Discharge Plan: Home/Self Care Barriers to Discharge: Continued Medical Work up   Patient Goals and CMS Choice Patient states their goals for this hospitalization and ongoing recovery are:: Return home          Expected Discharge Plan and Services   Discharge Planning Services: CM Consult   Living arrangements for the past 2 months: Apartment                 DME Arranged: N/A DME Agency: [facility]       HH Arranged: NA HH Agency: NA        Prior Living Arrangements/Services Living arrangements for the past 2 months: Apartment Lives with:: Self Patient language and need for interpreter reviewed:: Yes Do you feel safe going back to the place where you live?: Yes      Need for Family Participation in Patient Care: No (Comment) Care giver support system in place?: Yes (comment) Current home services: DME (oxygen at night) Criminal Activity/Legal Involvement Pertinent to Current Situation/Hospitalization: No - Comment as needed  Activities of Daily Living   ADL Screening (condition at time of admission) Independently performs ADLs?: Yes (appropriate for developmental age) Is the patient deaf or have difficulty hearing?: No Does the patient have difficulty seeing, even when wearing glasses/contacts?: No Does the patient  have difficulty concentrating, remembering, or making decisions?: No  Permission Sought/Granted Permission sought to share information with : Case Manager Permission granted to share information with : Yes, Verbal Permission Granted     Permission granted to share info w AGENCY: [employer]        Emotional Assessment Appearance:: Appears stated age Attitude/Demeanor/Rapport: Engaged Affect (typically observed): Accepting, Appropriate Orientation: : Oriented to Self, Oriented to Place, Oriented to  Time, Oriented to Situation Alcohol / Substance Use: Not Applicable Psych Involvement: No (comment)  Admission diagnosis:  Acute and chronic respiratory failure with hypoxia (HCC) [J96.21] Acute on chronic respiratory failure with hypoxia (HCC) [J96.21] Multifocal pneumonia [J18.9] Patient Active Problem List   Diagnosis Date Noted   Acute and chronic respiratory failure with hypoxia (HCC) 03/14/2024   COPD (chronic obstructive pulmonary disease) (HCC) 03/14/2024   Dyspnea on exertion 09/24/2023   Chronic diastolic CHF (congestive heart failure) (HCC) 09/24/2023   Acute on chronic hypoxic respiratory failure (HCC) 09/21/2023   Hyperlipidemia 05/18/2022   Acute respiratory failure with hypoxia (HCC) 10/21/2018   IDA (iron deficiency anemia) 10/21/2018   COPD with acute exacerbation (HCC) 10/21/2018   Pneumonia 06/18/2017   Chronic hypoxic respiratory failure, on home oxygen therapy (HCC) 06/18/2017   Essential hypertension, benign 09/13/2014   CAFL (chronic airflow limitation) (HCC) 04/26/2012   SINUSITIS, CHRONIC 12/14/2007   BRONCHITIS 12/14/2007   PULMONARY FIBROSIS, 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 [place] [address] Phone: [phone] Fax: [phone]  [place] [facility] [address] [place]  [address] Phone: [phone] Fax: [phone]     Social Drivers of Health (SDOH) Social History: SDOH Screenings   Food Insecurity: No Food Insecurity (03/14/2024)  Housing: Low Risk  (03/14/2024)  Transportation Needs: No Transportation Needs (03/14/2024)  Utilities: Not At Risk (03/14/2024)  Social Connections: Unknown (03/15/2024)  Tobacco Use: Medium Risk (03/14/2024)   SDOH Interventions:     Readmission Risk Interventions    03/15/2024   12:11 PM 09/23/2023   11:20 AM  Readmission Risk Prevention Plan  Post Dischage Appt  Complete  Medication Screening  Complete  Transportation Screening Complete Complete  PCP or Specialist Appt within 5-7 Days Complete   Home Care Screening Complete   Medication Review (RN CM) Complete

## 2024-03-15 NOTE — Plan of Care (Signed)
   Problem: Education: Goal: Knowledge of General Education information will improve Description Including pain rating scale, medication(s)/side effects and non-pharmacologic comfort measures Outcome: Progressing   Problem: Health Behavior/Discharge Planning: Goal: Ability to manage health-related needs will improve Outcome: Progressing

## 2024-03-16 ENCOUNTER — Other Ambulatory Visit (HOSPITAL_COMMUNITY): Payer: Self-pay

## 2024-03-16 DIAGNOSIS — J9621 Acute and chronic respiratory failure with hypoxia: Secondary | ICD-10-CM | POA: Diagnosis not present

## 2024-03-16 LAB — BASIC METABOLIC PANEL
Anion gap: 9 (ref 5–15)
BUN: 14 mg/dL (ref 8–23)
CO2: 28 mmol/L (ref 22–32)
Calcium: 9.4 mg/dL (ref 8.9–10.3)
Chloride: 98 mmol/L (ref 98–111)
Creatinine, Ser: 0.62 mg/dL (ref 0.44–1.00)
GFR, Estimated: >60 mL/min (ref >60)
Glucose, Bld: 101 mg/dL — ABNORMAL HIGH (ref 70–99)
Potassium: 3.8 mmol/L (ref 3.5–5.1)
Sodium: 135 mmol/L (ref 135–145)

## 2024-03-16 LAB — CBC WITH DIFFERENTIAL/PLATELET
Abs Immature Granulocytes: 0.08 10*3/uL — ABNORMAL HIGH (ref 0.00–0.07)
Basophils Absolute: 0 10*3/uL (ref 0.0–0.1)
Basophils Relative: 0 %
Eosinophils Absolute: 0 10*3/uL (ref 0.0–0.5)
Eosinophils Relative: 0 %
HCT: 32.9 % — ABNORMAL LOW (ref 36.0–46.0)
Hemoglobin: 10.2 g/dL — ABNORMAL LOW (ref 12.0–15.0)
Immature Granulocytes: 1 %
Lymphocytes Relative: 7 %
Lymphs Abs: 0.7 10*3/uL (ref 0.7–4.0)
MCH: 27.8 pg (ref 26.0–34.0)
MCHC: 31 g/dL (ref 30.0–36.0)
MCV: 89.6 fL (ref 80.0–100.0)
Monocytes Absolute: 0.7 10*3/uL (ref 0.1–1.0)
Monocytes Relative: 7 %
Neutro Abs: 8.5 10*3/uL — ABNORMAL HIGH (ref 1.7–7.7)
Neutrophils Relative %: 85 %
Platelets: 271 10*3/uL (ref 150–400)
RBC: 3.67 MIL/uL — ABNORMAL LOW (ref 3.87–5.11)
RDW: 15.1 % (ref 11.5–15.5)
WBC: 9.9 10*3/uL (ref 4.0–10.5)
nRBC: 0 % (ref 0.0–0.2)

## 2024-03-16 LAB — ACID FAST SMEAR (AFB, MYCOBACTERIA): Acid Fast Smear: NEGATIVE

## 2024-03-16 LAB — MAGNESIUM: Magnesium: 1.9 mg/dL (ref 1.7–2.4)

## 2024-03-16 MED ORDER — AZITHROMYCIN 500 MG PO TABS
500.0000 mg | ORAL_TABLET | Freq: Every day | ORAL | 0 refills | Status: AC
  Filled 2024-03-16: qty 3, 3d supply, fill #0

## 2024-03-16 MED ORDER — PREDNISONE 20 MG PO TABS
40.0000 mg | ORAL_TABLET | Freq: Every day | ORAL | 0 refills | Status: DC
Start: 2024-03-16 — End: 2024-03-16
  Filled 2024-03-16: qty 6, 3d supply, fill #0

## 2024-03-16 MED ORDER — PREDNISONE 20 MG PO TABS
ORAL_TABLET | ORAL | 0 refills | Status: AC
  Filled 2024-03-16: qty 15, 12d supply, fill #0

## 2024-03-16 MED ORDER — AMOXICILLIN 500 MG PO CAPS
1000.0000 mg | ORAL_CAPSULE | Freq: Three times a day (TID) | ORAL | 0 refills | Status: AC
  Filled 2024-03-16: qty 18, 3d supply, fill #0

## 2024-03-16 NOTE — Discharge Summary (Signed)
 Physician Discharge Summary  Breanna Ramirez ZOX:[id number] DOB: April 04, 1956 DOA: 03/14/2024  PCP: Breanna Real, MD  Admit date: 03/14/2024 Discharge date: 03/16/2024  Time spent: 40 minutes  Recommendations for Outpatient Follow-up:  Follow outpatient CBC/CMP  Follow oxygen need outpatient, discharging with 2 L with activity Follow with pulmonology outpatient for abnormal CT scan -> needs workup/eval for MAC AFB smear/culture pending  Discharge Diagnoses:  Principal Problem:   Acute and chronic respiratory failure with hypoxia (HCC) Active Problems:   Pneumonia   COPD with acute exacerbation (HCC)   G E R D   Essential hypertension, benign   Hyperlipidemia   Chronic diastolic CHF (congestive heart failure) (HCC)   COPD (chronic obstructive pulmonary disease) (HCC)   Discharge Condition: stable  Diet recommendation: heart healthy  Filed Weights   03/14/24 0944  Weight: 59 kg    History of present illness:   Breanna Ramirez is Breanna Ramirez 68 year old female with PMH COPD on nocturnal oxygen, HTN, GERD who presented with lingering cough and shortness of breath.  She has had symptoms for approximately 1 month.  She has been on 2 courses of antibiotics and steroids finishing prednisone approximately 1 week prior to admission.  CT angio chest was negative for PE and showed bibasilar irregular consolidations with subpleural groundglass opacities.  She was started on antibiotics and admitted for workup of atypical pneumonia.  She's improved on day of discharge, satting on RA at rest and requiring O2 with activity.  Discharged with supplemental O2 with activity, steroid taper, and antibiotics.   Hospital Course:  Assessment and Plan:  Acute and chronic respiratory failure with hypoxia (HCC) - Presumed due to findings noted on CT angio chest - On 2 L nocturnal oxygen at home -> today she's on RA at rest, but is requiring 2 L with activity to maintain sats -- will d/c home with O2 with activity, wean as  tolerated outpatient    Pneumonia - CT chest with findings concerning for multifocal pneumonia (including atypical etiologies, including MAC), also mild diffuse interlobular septal thickening due to atypical infection vs pulmonary edema - previous provider discussed with pulm, no need for inpatient c/s, they'll follow after discharge - AFB smear/culture pending  - Continue treating for pneumonia with amoxicillin/azithromycin    COPD with acute exacerbation (HCC) - Presumed due to underlying pneumonia - continue home trelegy.  Discharge with steroids and antibiotics.     COPD (chronic obstructive pulmonary disease) (HCC) - Follows outpatient with pulmonology - See exacerbation   Chronic diastolic CHF (congestive heart failure) (HCC) - euvolemic on exam - Last echo 09/22/2023: EF 70 to 75%, no RWMA, mild LVH, grade 1 diastolic dysfunction   Hyperlipidemia - Continue Zocor   G E R D - Continue Protonix    Procedures: none   Consultations: none  Discharge Exam: Vitals:   03/16/24 0751 03/16/24 1148  BP:  123/68  Pulse:  87  Resp:  18  Temp:  98.1 F (36.7 C)  SpO2: 96% 90%   Eager to discharge today if possible Doesn't mind using oxygen during the day, she already uses at night  General: No acute distress. Cardiovascular: RRR Lungs: Clear to auscultation bilaterally  Abdomen: Soft, nontender, nondistended  Neurological: Alert and oriented 3. Moves all extremities 4 with equal strength. Cranial nerves II through XII grossly intact. Extremities: No clubbing or cyanosis. No edema.   Discharge Instructions   Discharge Instructions     Call MD for:  difficulty breathing, headache or visual disturbances  Complete by: As directed    Call MD for:  extreme fatigue   Complete by: As directed    Call MD for:  hives   Complete by: As directed    Call MD for:  persistant dizziness or light-headedness   Complete by: As directed    Call MD for:  persistant nausea and  vomiting   Complete by: As directed    Call MD for:  redness, tenderness, or signs of infection (pain, swelling, redness, odor or green/yellow discharge around incision site)   Complete by: As directed    Call MD for:  severe uncontrolled pain   Complete by: As directed    Call MD for:  temperature >100.4   Complete by: As directed    Diet - low sodium heart healthy   Complete by: As directed    Discharge instructions   Complete by: As directed    You were seen for pneumonia and Breanna Ramirez COPD exacerbation.  You've improved with steroids, antibiotics, and breathing treatments.  We'll send you home with supplemental oxygen to use with activity.  You should use 2 liters of oxygen with activity and at night.    You should follow with the lung doctors or your PCP outpatient to determine hold long you'll need the extra oxygen.  We're going to send you with antibiotics and steroids to complete your courses.  Continue to use your trelegy as your controller medicine and your albuterol or duonebs as needed for shortness of breath.  You had abnormal findings on your CT scan concerning for an atypical infection.  You'll need follow up with pulmonology outpatient to help determine next steps.   Return for new, recurrent, or worsening symptoms.  Please ask your PCP to request records from this hospitalization so they know what was done and what the next steps will be.   Increase activity slowly   Complete by: As directed       Allergies as of 03/16/2024       Reactions   Codeine Nausea And Vomiting        Medication List     STOP taking these medications    amoxicillin-clavulanate 875-125 MG tablet Commonly known as: AUGMENTIN       TAKE these medications    albuterol 108 (90 Base) MCG/ACT inhaler Commonly known as: VENTOLIN HFA Inhale 2 puffs into the lungs every 6 (six) hours as needed for wheezing or shortness of breath.   amoxicillin 500 MG capsule Commonly known as:  AMOXIL Take 2 capsules (1,000 mg total) by mouth 3 (three) times daily for 3 days.   azithromycin 500 MG tablet Commonly known as: ZITHROMAX Take 1 tablet (500 mg total) by mouth daily for 3 days.   esomeprazole 40 MG capsule Commonly known as: NEXIUM Take 1 capsule (40 mg total) by mouth daily.   fluticasone 50 MCG/ACT nasal spray Commonly known as: FLONASE Place 1 spray into both nostrils daily.   hydrochlorothiazide 25 MG tablet Commonly known as: HYDRODIURIL Take 1 tablet (25 mg total) by mouth daily.   ipratropium-albuterol 0.5-2.5 (3) MG/3ML Soln Commonly known as: DUONEB SMARTSIG:1 Ampule(s) Via Nebulizer 3 Times Daily PRN What changed:  how much to take how to take this when to take this reasons to take this additional instructions   montelukast 10 MG tablet Commonly known as: SINGULAIR Take 1 tablet (10 mg total) by mouth at bedtime.   predniSONE 20 MG tablet Commonly known as: DELTASONE Take 2 tablets (40 mg  total) by mouth daily for 3 days.   simvastatin 40 MG tablet Commonly known as: ZOCOR Take 1 tablet (40 mg total) by mouth at bedtime.   sodium chloride HYPERTONIC 3 % nebulizer solution Take 4 mLs by nebulization 3 (three) times daily.   Trelegy Ellipta 200-62.5-25 MCG/ACT Aepb Generic drug: Fluticasone-Umeclidin-Vilant Inhale 1 puff into the lungs daily.               Durable Medical Equipment  (From admission, onward)           Start     Ordered   03/16/24 1411  For home use only DME oxygen  Once       Comments: SATURATION QUALIFICATIONS: (This note is used to comply with regulatory documentation for home oxygen)   Patient Saturations on Room Air at Rest = 90%   Patient Saturations on Room Air while Ambulating = 84%   Patient Saturations on 2 Liters of oxygen while Ambulating = 90%   Please briefly explain why patient needs home oxygen: Walk test performed by mobility specialist. Patient able to maintain saturation >88% on  supplemental oxygen while ambulating.  Question Answer Comment  Length of Need 12 Months   Mode or (Route) Nasal cannula   Liters per Minute 2   Frequency Continuous (stationary and portable oxygen unit needed)   Oxygen conserving device Yes   Oxygen delivery system Gas      03/16/24 1410           Allergies  Allergen Reactions   Codeine Nausea And Vomiting    Follow-up Information     Breanna Real, MD Follow up.   Specialty: Internal Medicine Contact information: [address] [place] [address] [phone]         Julio Sicks, NP Follow up.   Specialty: Pulmonary Disease Why: you have an appointment on April 29th at 11:30 am.  Call if you have questions prior to this appointment. Contact information: [address] [place] [address] [phone]                  The results of significant diagnostics from this hospitalization (including imaging, microbiology, ancillary and laboratory) are listed below for reference.    Significant Diagnostic Studies: CT Angio Chest Pulmonary Embolism (PE) W or WO Contrast Result Date: 03/14/2024 CLINICAL DATA:  One-month history of shortness of breath, not improving with antibiotics and steroids. Associated cough EXAM: CT ANGIOGRAPHY CHEST WITH CONTRAST TECHNIQUE: Multidetector CT imaging of the chest was performed using the standard protocol during bolus administration of intravenous contrast. Multiplanar CT image reconstructions and MIPs were obtained to evaluate the vascular anatomy. RADIATION DOSE REDUCTION: This exam was performed according to the departmental dose-optimization program which includes automated exposure control, adjustment of the mA and/or kV according to patient size and/or use of iterative reconstruction technique. CONTRAST:  75mL OMNIPAQUE IOHEXOL 350 MG/ML SOLN COMPARISON:  Same day chest radiograph, CT chest dated 12/17/2023 FINDINGS: Cardiovascular: The study is high  quality for the evaluation of pulmonary embolism. There are no filling defects in the central, lobar, segmental or subsegmental pulmonary artery branches to suggest acute pulmonary embolism. Great vessels are normal in course and caliber. Normal heart size. No significant pericardial fluid/thickening. Coronary artery calcifications and aortic atherosclerosis. Mediastinum/Nodes: Imaged thyroid gland without nodules meeting criteria for imaging follow-up by size. Normal esophagus. 11 mm right hilar lymph node (5:88) Lungs/Pleura: The central airways are patent. Trace layering secretions within the trachea with bibasilar  subsegmental mucous plugging. Similar moderate upper lobe predominant centrilobular emphysema. Mild diffuse interlobular septal thickening. Similar lingular and right middle lobe atelectasis/scarring with traction bronchiectasis. Slightly increased bibasilar irregular consolidations, for example medial right lower lobe (13:124) as well as new areas subpleural ground-glass opacity, for example posterior left lower lobe (13:96). Similar to increased bandlike linear atelectasis/scarring. No pneumothorax. No pleural effusion. Upper abdomen: Normal. Musculoskeletal: No acute or abnormal lytic or blastic osseous lesions. Review of the MIP images confirms the above findings. IMPRESSION: 1. No evidence of pulmonary embolism. 2. Slightly increased bibasilar irregular consolidations and new areas of subpleural ground-glass opacity, which may reflect multifocal pneumonia, including atypical etiologies, including mycobacterium avium complex in the setting of lingular and right middle lobe atelectasis/scarring with traction bronchiectasis. 3. Mild diffuse interlobular septal thickening, which may be due to pulmonary edema or atypical infection. 4. Enlarged right hilar lymph node, likely reactive. 5. Aortic Atherosclerosis (ICD10-I70.0) and Emphysema (ICD10-J43.9). Coronary artery calcifications. Assessment for  potential risk factor modification, dietary therapy or pharmacologic therapy may be warranted, if clinically indicated. Electronically Signed   By: Agustin Cree M.D.   On: 03/14/2024 16:47   DG Chest 2 View Result Date: 03/14/2024 CLINICAL DATA:  Cough and shortness of breath for 1 month. EXAM: CHEST - 2 VIEW COMPARISON:  09/21/2023. FINDINGS: Bilateral lungs appear hyperexpanded and hyperlucent with coarse bronchovascular markings, in keeping with COPD. There is Porshea Janowski linear area of atelectasis/scarring in the middle lobe. There are nonspecific opacities overlying the left mid lung zone and left retrocardiac region, which may represent atelectasis/scarring versus pneumonitis. Correlate clinically. Bilateral lungs otherwise appear clear. Bilateral costophrenic angles are clear. Normal cardio-mediastinal silhouette. No acute osseous abnormalities. The soft tissues are within normal limits. IMPRESSION: *COPD. *Nonspecific opacities overlying the left mid lung zone and left retrocardiac region, which may represent atelectasis/scarring versus pneumonitis. *Linear area of atelectasis/scarring in the middle lobe. Electronically Signed   By: Jules Schick M.D.   On: 03/14/2024 10:19    Microbiology: Recent Results (from the past 240 hours)  Respiratory (~20 pathogens) panel by PCR     Status: None   Collection Time: 03/14/24  6:16 PM   Specimen: Nasopharyngeal Swab; Respiratory  Result Value Ref Range Status   Adenovirus NOT DETECTED NOT DETECTED Final   Coronavirus 229E NOT DETECTED NOT DETECTED Final    Comment: (NOTE) The Coronavirus on the Respiratory Panel, DOES NOT test for the novel  Coronavirus (2019 nCoV)    Coronavirus HKU1 NOT DETECTED NOT DETECTED Final   Coronavirus NL63 NOT DETECTED NOT DETECTED Final   Coronavirus OC43 NOT DETECTED NOT DETECTED Final   Metapneumovirus NOT DETECTED NOT DETECTED Final   Rhinovirus / Enterovirus NOT DETECTED NOT DETECTED Final   Influenza Hubert Derstine NOT DETECTED NOT  DETECTED Final   Influenza B NOT DETECTED NOT DETECTED Final   Parainfluenza Virus 1 NOT DETECTED NOT DETECTED Final   Parainfluenza Virus 2 NOT DETECTED NOT DETECTED Final   Parainfluenza Virus 3 NOT DETECTED NOT DETECTED Final   Parainfluenza Virus 4 NOT DETECTED NOT DETECTED Final   Respiratory Syncytial Virus NOT DETECTED NOT DETECTED Final   Bordetella pertussis NOT DETECTED NOT DETECTED Final   Bordetella Parapertussis NOT DETECTED NOT DETECTED Final   Chlamydophila pneumoniae NOT DETECTED NOT DETECTED Final   Mycoplasma pneumoniae NOT DETECTED NOT DETECTED Final    Comment: Performed at [facility], [address] [place], [place] [address]     Labs: Basic Metabolic Panel: Recent Labs  Lab  03/14/24 1352 03/15/24 0526 03/16/24 0614  NA 135 138 135  K 4.2 3.5 3.8  CL 98 100 98  CO2 28 28 28   GLUCOSE 154* 85 101*  BUN 13 11 14   CREATININE 0.76 0.73 0.62  CALCIUM 9.5 9.3 9.4  MG  --   --  1.9   Liver Function Tests: Recent Labs  Lab 03/14/24 1352  AST 16  ALT 10  ALKPHOS 55  BILITOT 0.3  PROT 6.6  ALBUMIN 3.1*   No results for input(s): "LIPASE", "AMYLASE" in the last 168 hours. No results for input(s): "AMMONIA" in the last 168 hours. CBC: Recent Labs  Lab 03/14/24 1352 03/15/24 0526 03/16/24 0614  WBC 6.4 7.8 9.9  NEUTROABS 5.8  --  8.5*  HGB 10.8* 10.7* 10.2*  HCT 34.7* 36.1 32.9*  MCV 89.4 91.2 89.6  PLT 261 284 271   Cardiac Enzymes: No results for input(s): "CKTOTAL", "CKMB", "CKMBINDEX", "TROPONINI" in the last 168 hours. BNP: BNP (last 3 results) Recent Labs    05/12/23 1002 09/21/23 1810 03/14/24 1352  BNP 184.9* 76.2 66.5    ProBNP (last 3 results) No results for input(s): "PROBNP" in the last 8760 hours.  CBG: No results for input(s): "GLUCAP" in the last 168 hours.     Signed:  Lacretia Nicks MD.  [facility] 03/16/2024, 2:20 PM

## 2024-03-16 NOTE — Progress Notes (Signed)
 Mobility Specialist - Progress Note   03/16/24 1151  Mobility  Activity Ambulated independently in hallway  Level of Assistance Independent  Assistive Device None  Distance Ambulated (ft) 500 ft  Activity Response Tolerated well  Mobility Referral Yes  Mobility visit 1 Mobility  Mobility Specialist Start Time (ACUTE ONLY) 1134  Mobility Specialist Stop Time (ACUTE ONLY) 1151  Mobility Specialist Time Calculation (min) (ACUTE ONLY) 17 min   Nurse requested Mobility Specialist to perform oxygen saturation test with pt which includes removing pt from oxygen both at rest and while ambulating.  Below are the results from that testing.     Patient Saturations on Room Air at Rest = spO2 89%  Patient Saturations on Room Air while Ambulating = sp02 84% .   Patient Saturations on 2 Liters of oxygen while Ambulating = sp02 90%  At end of testing pt left in room on 2  Liters of oxygen.  Reported results to nurse.  Pt received in bed and agreeable to mobility. No complaints during session. Pt to bed after session with all needs met.    Pre-mobility: 113 HR, 89% SpO2 (RA) During mobility: 122 HR, 84-90%  SpO2 (RA-2L [place]) Post-mobility: 102 HR, 91% SPO2 (2L [place])  Chief Technology Officer

## 2024-03-16 NOTE — Progress Notes (Signed)
 SATURATION QUALIFICATIONS: (This note is used to comply with regulatory documentation for home oxygen)  Patient Saturations on Room Air at Rest = 90%  Patient Saturations on Room Air while Ambulating = 84%  Patient Saturations on 2 Liters of oxygen while Ambulating = 90%  Please briefly explain why patient needs home oxygen: Walk test performed by mobility specialist. Patient able to maintain saturation >88% on supplemental oxygen while ambulating.

## 2024-03-16 NOTE — Plan of Care (Signed)
 No acute events overnight. Problem: Education: Goal: Knowledge of General Education information will improve Description: Including pain rating scale, medication(s)/side effects and non-pharmacologic comfort measures Outcome: Progressing   Problem: Health Behavior/Discharge Planning: Goal: Ability to manage health-related needs will improve Outcome: Progressing   Problem: Clinical Measurements: Goal: Ability to maintain clinical measurements within normal limits will improve Outcome: Progressing Goal: Will remain free from infection Outcome: Progressing Goal: Diagnostic test results will improve Outcome: Progressing Goal: Respiratory complications will improve Outcome: Progressing Goal: Cardiovascular complication will be avoided Outcome: Progressing   Problem: Activity: Goal: Risk for activity intolerance will decrease Outcome: Progressing   Problem: Nutrition: Goal: Adequate nutrition will be maintained Outcome: Progressing   Problem: Coping: Goal: Level of anxiety will decrease Outcome: Progressing   Problem: Elimination: Goal: Will not experience complications related to bowel motility Outcome: Progressing Goal: Will not experience complications related to urinary retention Outcome: Progressing   Problem: Pain Managment: Goal: General experience of comfort will improve and/or be controlled Outcome: Progressing   Problem: Safety: Goal: Ability to remain free from injury will improve Outcome: Progressing   Problem: Skin Integrity: Goal: Risk for impaired skin integrity will decrease Outcome: Progressing   Problem: Activity: Goal: Ability to tolerate increased activity will improve Outcome: Progressing   Problem: Clinical Measurements: Goal: Ability to maintain a body temperature in the normal range will improve Outcome: Progressing   Problem: Respiratory: Goal: Ability to maintain adequate ventilation will improve Outcome: Progressing Goal: Ability to  maintain a clear airway will improve Outcome: Progressing

## 2024-03-17 LAB — LEGIONELLA PNEUMOPHILA SEROGP 1 UR AG: L. pneumophila Serogp 1 Ur Ag: NEGATIVE

## 2024-03-22 IMAGING — CR DG CHEST 2V
2 series · 2 of 2 positions shown · non-contrast
Comparison: October 31, 2021

CLINICAL DATA: Shortness of breath.  Wheezing.

EXAM:
CHEST - 2 VIEW

[w chest pa]
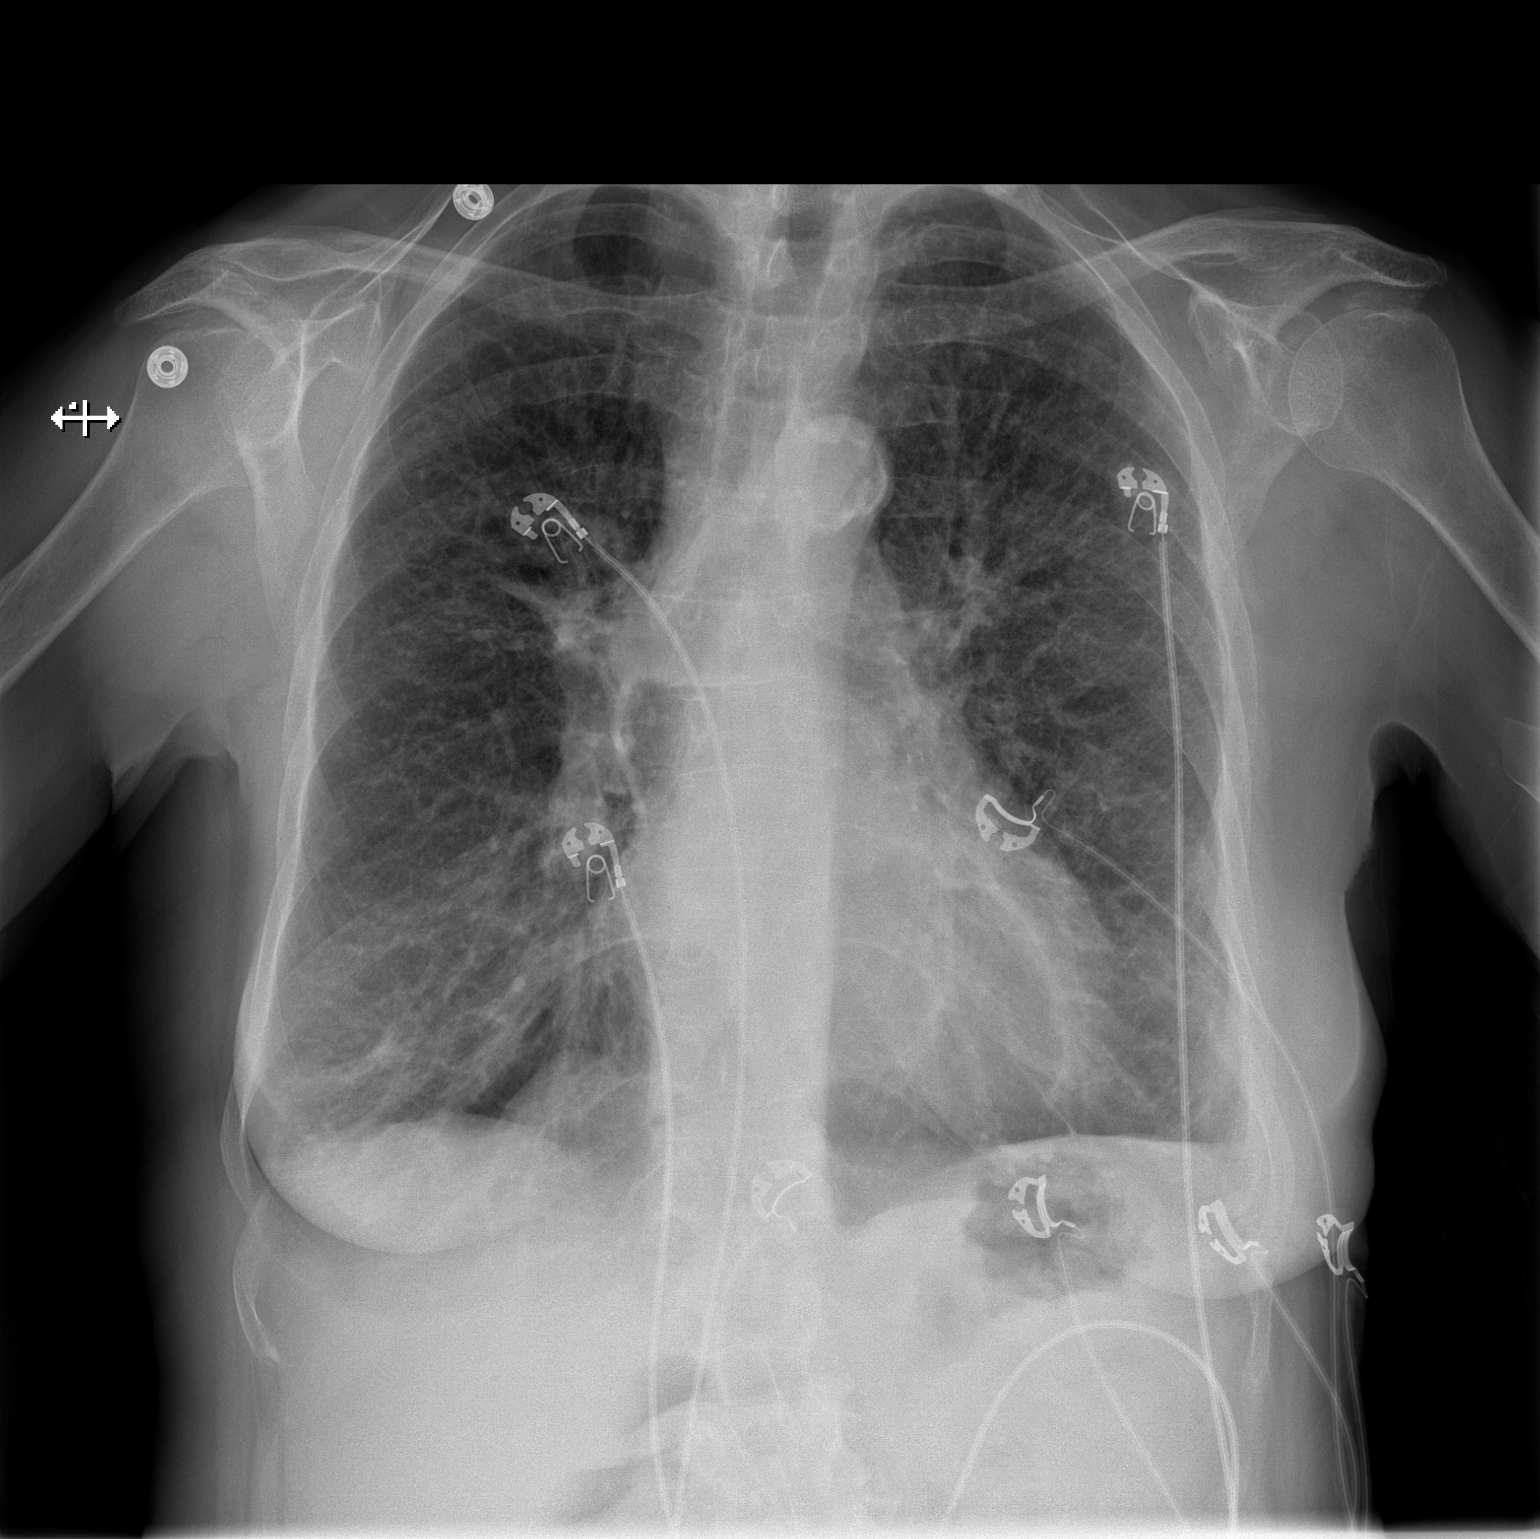

[w chest lat]
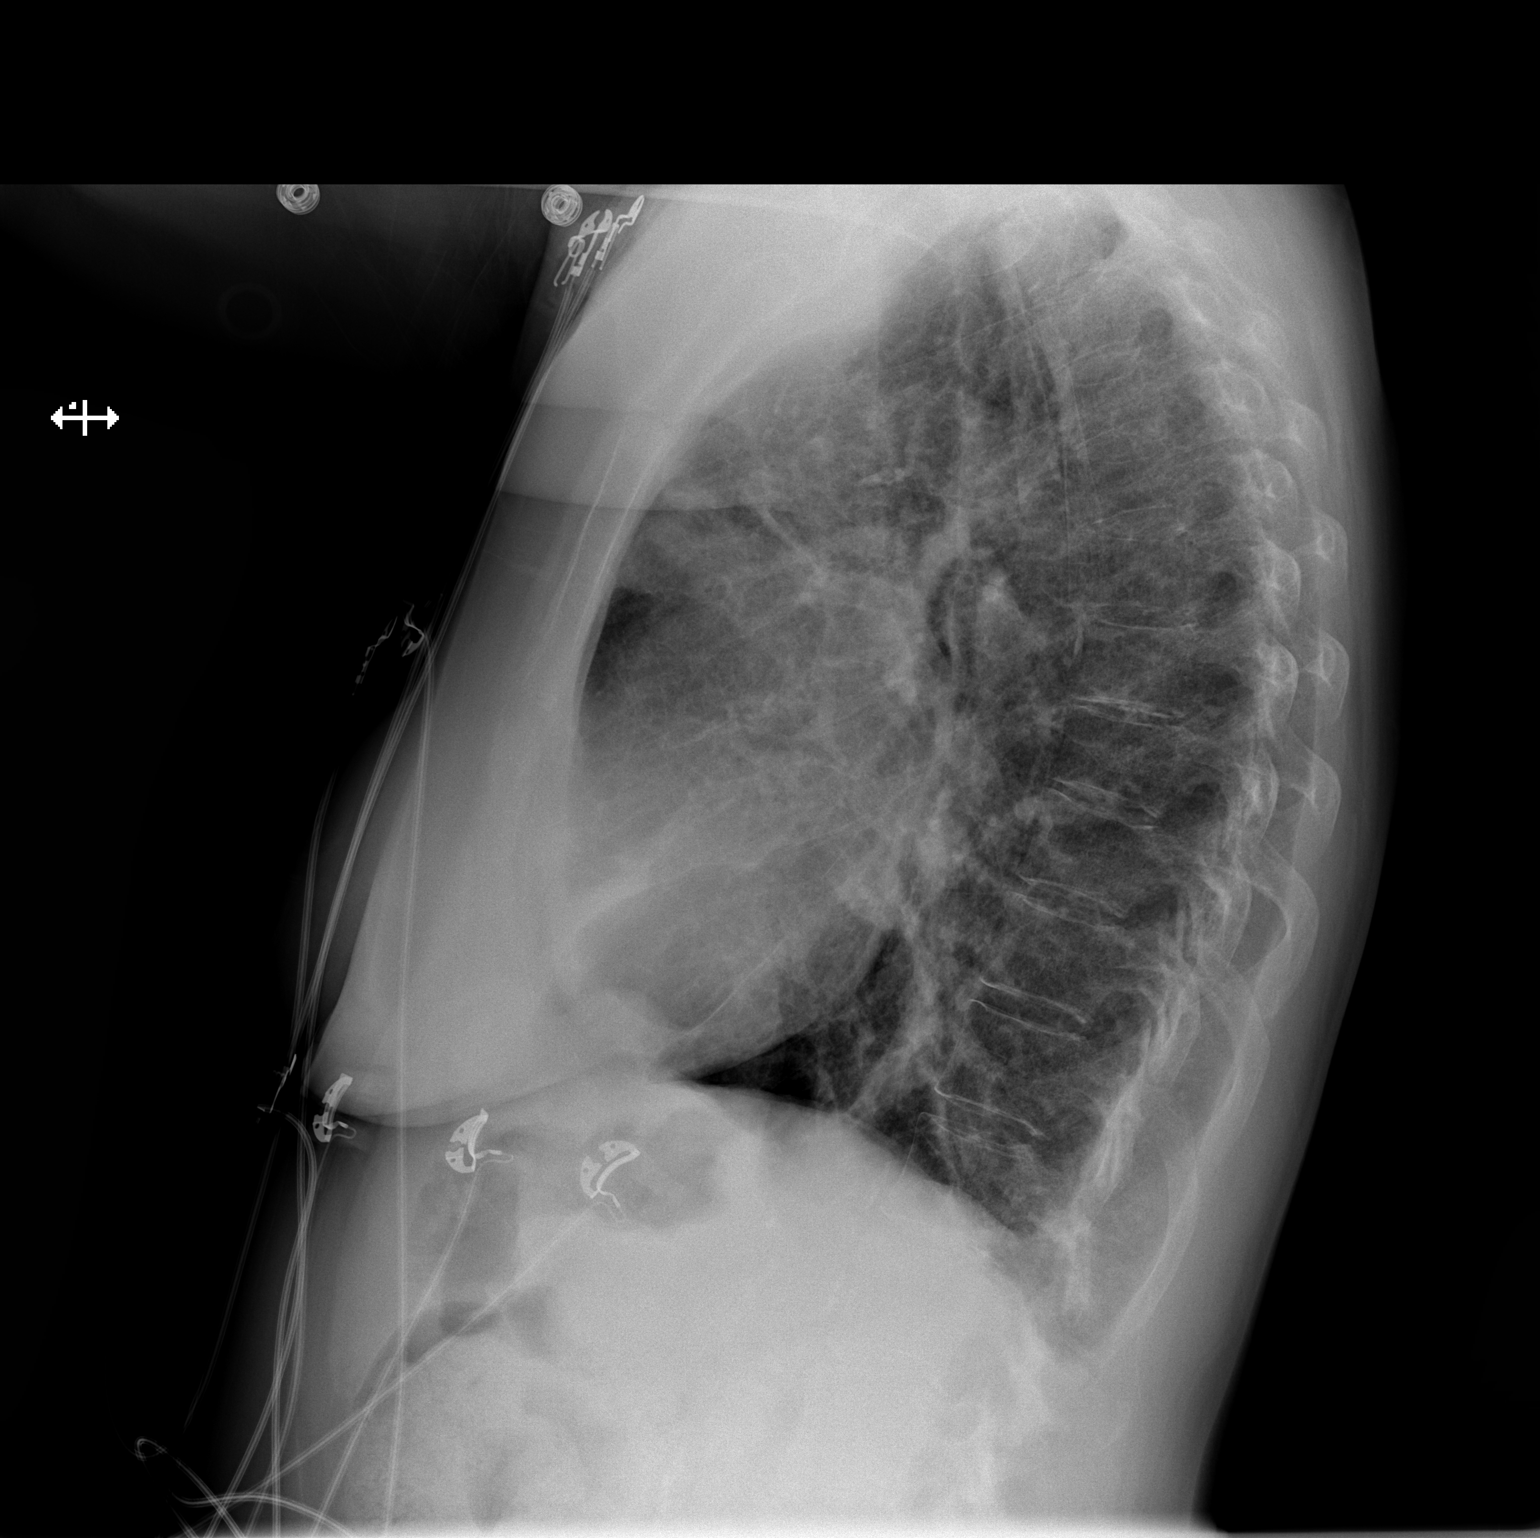

[2 of 2 positions shown; findings below may reference images not displayed]

FINDINGS: Cardiomediastinal silhouette is stable. No pneumothorax. Blunting of
the costophrenic angles unchanged since September 25, 2020.
Emphysematous changes identified in the lungs. Interstitial
prominence is similar in the interval. Chronic opacity in the
lingula and right middle lobe was noted to represent atelectasis on
the October 31, 2021 CT scan. The opacity in the right base is
similar in appearance compared to September 25, 2020.
IMPRESSION: Chronic changes in the lungs as above. No definite acute
abnormality. It would be difficult to assess for a subtle acute on
chronic opacity.

## 2024-04-26 ENCOUNTER — Ambulatory Visit (INDEPENDENT_AMBULATORY_CARE_PROVIDER_SITE_OTHER)

## 2024-04-26 ENCOUNTER — Encounter: Payer: Self-pay | Admitting: Adult Health

## 2024-04-26 ENCOUNTER — Ambulatory Visit: Admitting: Adult Health

## 2024-04-26 VITALS — BP 134/60 | HR 114 | Temp 98.4°F | Ht 64.5 in | Wt 125.6 lb

## 2024-04-26 DIAGNOSIS — I5032 Chronic diastolic (congestive) heart failure: Secondary | ICD-10-CM | POA: Diagnosis not present

## 2024-04-26 DIAGNOSIS — J9611 Chronic respiratory failure with hypoxia: Secondary | ICD-10-CM | POA: Diagnosis not present

## 2024-04-26 DIAGNOSIS — J189 Pneumonia, unspecified organism: Secondary | ICD-10-CM | POA: Diagnosis not present

## 2024-04-26 DIAGNOSIS — J449 Chronic obstructive pulmonary disease, unspecified: Secondary | ICD-10-CM

## 2024-04-26 DIAGNOSIS — Z9981 Dependence on supplemental oxygen: Secondary | ICD-10-CM

## 2024-04-26 DIAGNOSIS — J441 Chronic obstructive pulmonary disease with (acute) exacerbation: Secondary | ICD-10-CM

## 2024-04-26 MED ORDER — OHTUVAYRE 3 MG/2.5ML IN SUSP: 1.0000 | Freq: Two times a day (BID) | RESPIRATORY_TRACT | 11 refills | Status: AC

## 2024-04-26 MED ORDER — AMOXICILLIN-POT CLAVULANATE 875-125 MG PO TABS
1.0000 | ORAL_TABLET | Freq: Two times a day (BID) | ORAL | 0 refills | Status: DC
Start: 2024-04-26 — End: 2024-05-15

## 2024-04-26 MED ORDER — PREDNISONE 10 MG PO TABS: ORAL_TABLET | ORAL | 0 refills | Status: DC

## 2024-04-26 NOTE — Assessment & Plan Note (Addendum)
 Slow to resolve COPD exacerbation over the last several weeks in the setting of suspected multifocal pneumonia.  Recent hospitalization treated with empiric antibiotics-patient has minimally productive cough and no fever.  Will increase mucociliary clearance-add flutter valve twice daily after neb treatments.  Add steroid burst over the next week.  Check sputum culture if able to produce sputum sample.  Add Ohtuvayre  Neb Twice daily to maintenance regimen with triple therapy.  Continue on albuterol  or DuoNeb as needed. Chest x-ray today. On return check alpha 1 .   Plan  Patient Instructions  Chest x-ray today Check Sputum culture .  Add Ohtuvayre  neb Twice daily Add Flutter valve after neb treatments  Continue on Trelegy 1 puff daily, rinse after use Continue on Singulair  and Flonase  daily Continue on oxygen  3l/m -Wear all the time.  Order for POC -portable oxygen  device- Use POC at 4l/m  Albuterol  inhaler or DuoNeb as needed Follow-up with Dr. Diania Fortes in 6-8 weeks with PFT and As needed   Please contact office for sooner follow up if symptoms do not improve or worsen or seek emergency care

## 2024-04-26 NOTE — Assessment & Plan Note (Signed)
 Appears euvolemic on exam

## 2024-04-26 NOTE — Progress Notes (Addendum)
 @Patient  ID: Breanna Ramirez, female    DOB: [birth year], 68 y.o.   MRN: [id number]  Chief Complaint  Patient presents with   Hospitalization Follow-up    Referring provider: Narda Bacon, MD  HPI: 68 year old female former smoker followed for COPD and chronic respiratory failure (started on oxygen  May 2023 after hospitalization) Abnormal CT chest-right middle lobe collapse Participates in the lung cancer CT screening program  TEST/EVENTS :  CT chest 10/2022 -Near complete collapse of the right middle lobe with possible endobronchial nodule versus mucous plugging of the proximal right middle lobe. Lung-RADS 4AS, suspicious.  LDCT Chest 12/15/22 -moderate emphysema, right middle lobe central airway now patent, no endobronchial lesions identified, interval reexpansion of the right middle lobe with residual subsegmental atelectasis and bandlike areas of subsegmental atelectasis versus scarring.  Scattered lung nodules maximum measuring 3.3 mm.  LDCT chest December 17, 2023 lung RADS 2, moderate emphysema, no significant growth of previously visualized pulmonary nodules, no new significant pulmonary nodules.  Numerous thick parenchymal bands scattered throughout the mid to lower lungs bilaterally compatible with scarring or atelectasis.  echo 09/22/2023: EF 70 to 75%, no RWMA, mild LVH, grade 1 diastolic dysfunction    04/26/2024 Follow up ; COPD, chronic respiratory failure, lung nodules, posthospital follow-up Patient presents for a posthospital follow-up.  Last seen in the office in December 2023.  Patient was hospitalized last month for acute on chronic hypoxic respiratory failure, multifocal pneumonia and COPD exacerbation. Patient presented to the emergency room with COPD exacerbation unresponsive to outpatient therapy.  She had been treated with 2 courses of antibiotics and steroids prior to admission with no significant improvement.  She was set up for a CT chest that was negative for PE.   Showed bibasilar irregular consolidations with subpleural groundglass opacities.  She was started on empiric antibiotics and treated for atypical pneumonia.  Patient was recommended to use her oxygen  with activity and at bedtime.  She was discharged on amoxicillin  and azithromycin .  Sputum AFB was negative. Since discharge patient is feeling about the same continues to have ongoing cough that is minimally productive with wheezing.  Patient denies any fever or discolored mucus.  Gets short of breath with minimal activity. She remains on Trelegy inhaler daily.  Singulair  and Flonase  daily. Patient had been on oxygen  since 2023 after hospitalization for COPD exacerbation.  She was able to wean off of oxygen  during the daytime and was only using oxygen  at 3 L at bedtime.  At discharge patient was recommended to begin oxygen  3 L with activity and at bedtime.  Patient says she has not been wearing her oxygen  during the daytime she only has oxygen  tanks and needs a more portable system.  She has returned back to working full-time.  Typically uses her DuoNeb 2-3 times a day.  Does have a flutter valve but not using on a regular basis.  She denies any unintentional weight loss orthopnea, edema or hemoptysis.  She has no pets.  No basement or hot tub.  No distance of travel.  Endorses compliance with her medications.  Walk test today in the office :  Patient Saturations on Room Air at Rest = 88%   Patient Saturations on Room Air while Ambulating = 85%   Patient Saturations on 3 Liters of continuous flow oxygen  and 4L pulsed (on POC) oxygen  while Ambulating = 92% Please briefly explain why patient needs home oxygen :  requires oxygen  at rest and with exertion to keep sats >88-90%.  Allergies  Allergen Reactions   Codeine Nausea And Vomiting    Immunization History  Administered Date(s) Administered   Influenza-Unspecified 10/20/2022   Pneumococcal Polysaccharide-23 10/22/2018    Past Medical  History:  Diagnosis Date   COPD (chronic obstructive pulmonary disease) (HCC)    Essential hypertension    GERD (gastroesophageal reflux disease)    Pneumonia 05/2017   Respiratory failure with hypoxia (HCC) 05/2017    Tobacco History: Social History   Tobacco Use  Smoking Status Former   Current packs/day: 0.00   Average packs/day: 1 pack/day for 45.0 years (45.0 ttl pk-yrs)   Types: Cigarettes   Start date: 12/29/1972   Quit date: 12/29/2017   Years since quitting: 6.3  Smokeless Tobacco Never   Counseling given: Not Answered   Outpatient Medications Prior to Visit  Medication Sig Dispense Refill   albuterol  (PROVENTIL  HFA;VENTOLIN  HFA) 108 (90 Base) MCG/ACT inhaler Inhale 2 puffs into the lungs every 6 (six) hours as needed for wheezing or shortness of breath.     esomeprazole  (NEXIUM ) 40 MG capsule Take 1 capsule (40 mg total) by mouth daily. 90 capsule 4   fluticasone  (FLONASE ) 50 MCG/ACT nasal spray Place 1 spray into both nostrils daily.     Fluticasone -Umeclidin-Vilant (TRELEGY ELLIPTA ) 200-62.5-25 MCG/ACT AEPB Inhale 1 puff into the lungs daily. 28 each 11   hydrochlorothiazide  (HYDRODIURIL ) 25 MG tablet Take 1 tablet (25 mg total) by mouth daily. 90 tablet 3   ipratropium-albuterol  (DUONEB) 0.5-2.5 (3) MG/3ML SOLN SMARTSIG:1 Ampule(s) Via Nebulizer 3 Times Daily PRN (Patient taking differently: Take 3 mLs by nebulization as needed (wheezing/SOB).) 360 mL 6   montelukast  (SINGULAIR ) 10 MG tablet Take 1 tablet (10 mg total) by mouth at bedtime. 30 tablet 11   simvastatin  (ZOCOR ) 40 MG tablet Take 1 tablet (40 mg total) by mouth at bedtime. 90 tablet 3   sodium chloride  HYPERTONIC 3 % nebulizer solution Take 4 mLs by nebulization 3 (three) times daily. 750 mL 12   No facility-administered medications prior to visit.     Review of Systems:   Constitutional:   No  weight loss, night sweats,  Fevers, chills, +fatigue, or  lassitude.  HEENT:   No headaches,  Difficulty  swallowing,  Tooth/dental problems, or  Sore throat,                No sneezing, itching, ear ache, nasal congestion, post nasal drip,   CV:  No chest pain,  Orthopnea, PND, swelling in lower extremities, anasarca, dizziness, palpitations, syncope.   GI  No heartburn, indigestion, abdominal pain, nausea, vomiting, diarrhea, change in bowel habits, loss of appetite, bloody stools.   Resp: No shortness of breath with exertion or at rest.  No excess mucus, no productive cough,  No non-productive cough,  No coughing up of blood.  No change in color of mucus.  No wheezing.  No chest wall deformity  Skin: no rash or lesions.  GU: no dysuria, change in color of urine, no urgency or frequency.  No flank pain, no hematuria   MS:  No joint pain or swelling.  No decreased range of motion.  No back pain.    Physical Exam  BP 134/60 (BP Location: Left Arm, Patient Position: Sitting, Cuff Size: Normal)   Pulse (!) 114   Temp 98.4 F (36.9 C) (Oral)   Ht 5' 4.5" (1.638 m)   Wt 125 lb 9.6 oz (57 kg)   SpO2 (!) 84%   BMI 21.23 kg/m  GEN: A/Ox3; pleasant , NAD, well nourished    HEENT:  [place]/AT,  EACs-clear, TMs-wnl, NOSE-clear, THROAT-clear, no lesions, no postnasal drip or exudate noted.   NECK:  Supple w/ fair ROM; no JVD; normal carotid impulses w/o bruits; no thyromegaly or nodules palpated; no lymphadenopathy.    RESP coarse rhonchi bilaterally with few expiratory wheezes.  Speaks in full sentences.   no accessory muscle use, no dullness to percussion  CARD:  RRR, no m/r/g, no peripheral edema, pulses intact, no cyanosis or clubbing.  GI:   Soft & nt; nml bowel sounds; no organomegaly or masses detected.   Musco: Warm bil, no deformities or joint swelling noted.   Neuro: alert, no focal deficits noted.    Skin: Warm, no lesions or rashes    Lab Results:  CBC    Component Value Date/Time   WBC 9.9 03/16/2024 0614   RBC 3.67 (L) 03/16/2024 0614   HGB 10.2 (L) 03/16/2024 0614    HCT 32.9 (L) 03/16/2024 0614   PLT 271 03/16/2024 0614   MCV 89.6 03/16/2024 0614   MCH 27.8 03/16/2024 0614   MCHC 31.0 03/16/2024 0614   RDW 15.1 03/16/2024 0614   LYMPHSABS 0.7 03/16/2024 0614   MONOABS 0.7 03/16/2024 0614   EOSABS 0.0 03/16/2024 0614   BASOSABS 0.0 03/16/2024 0614    BMET    Component Value Date/Time   NA 135 03/16/2024 0614   NA 141 09/13/2014 1531   K 3.8 03/16/2024 0614   CL 98 03/16/2024 0614   CO2 28 03/16/2024 0614   GLUCOSE 101 (H) 03/16/2024 0614   BUN 14 03/16/2024 0614   BUN 12 09/13/2014 1531   CREATININE 0.62 03/16/2024 0614   CALCIUM 9.4 03/16/2024 0614   GFRNONAA >60 03/16/2024 0614   GFRAA >60 10/22/2018 0616    BNP    Component Value Date/Time   BNP 66.5 03/14/2024 1352    ProBNP No results found for: "PROBNP"  Imaging: No results found.  Administration History     None          Latest Ref Rng & Units 05/12/2022   10:47 AM  PFT Results  FVC-Pre L 1.67   FVC-Predicted Pre % 51   FVC-Post L 1.72   FVC-Predicted Post % 52   Pre FEV1/FVC % % 77   Post FEV1/FCV % % 79   FEV1-Pre L 1.28   FEV1-Predicted Pre % 51   FEV1-Post L 1.36   DLCO uncorrected ml/min/mmHg 8.38   DLCO UNC% % 41   DLCO corrected ml/min/mmHg 8.38   DLCO COR %Predicted % 41   DLVA Predicted % 56   TLC L 4.06   TLC % Predicted % 78   RV % Predicted % 104     No results found for: "NITRICOXIDE"      Assessment & Plan:   COPD with acute exacerbation (HCC) Slow to resolve COPD exacerbation over the last several weeks in the setting of suspected multifocal pneumonia.  Recent hospitalization treated with empiric antibiotics-patient has minimally productive cough and no fever.  Will increase mucociliary clearance-add flutter valve twice daily after neb treatments.  Add steroid burst over the next week.  Check sputum culture if able to produce sputum sample.  Add Ohtuvayre  Neb Twice daily to maintenance regimen with triple therapy.  Continue on  albuterol  or DuoNeb as needed. Chest x-ray today.  Plan  Patient Instructions  Chest x-ray today Check Sputum culture .  Add Ohtuvayre  neb Twice daily Add  Flutter valve after neb treatments  Continue on Trelegy 1 puff daily, rinse after use Continue on Singulair  and Flonase  daily Continue on oxygen  3l/m -Wear all the time.  Order for POC -portable oxygen  device- Use POC at 4l/m  Albuterol  inhaler or DuoNeb as needed Follow-up with Dr. Diania Fortes in 6-8 weeks with PFT and As needed   Please contact office for sooner follow up if symptoms do not improve or worsen or seek emergency care       Chronic hypoxic respiratory failure, on home oxygen  therapy [facility]) Patient is encouraged on oxygen  compliance.  Patient education given on potential complications of hypoxemia.  She is to use oxygen  3 L continuously.  Order for POC device to use at 4 L pulsed oxygen  to maintain O2 saturation greater than 88 to 90%.  Plan  Patient Instructions  Chest x-ray today Check Sputum culture .  Add Ohtuvayre  neb Twice daily Add Flutter valve after neb treatments  Continue on Trelegy 1 puff daily, rinse after use Continue on Singulair  and Flonase  daily Continue on oxygen  3l/m -Wear all the time.  Order for POC -portable oxygen  device- Use POC at 4l/m  Albuterol  inhaler or DuoNeb as needed Follow-up with Dr. Diania Fortes in 6-8 weeks with PFT and As needed   Please contact office for sooner follow up if symptoms do not improve or worsen or seek emergency care       Chronic diastolic CHF (congestive heart failure) (HCC) Appears euvolemic on exam.  Multifocal pneumonia Recent hospitalization with a multifocal pneumonia-CT chest showed bibasilar irregular consolidations and subpleural groundglass opacities.-Sputum AFB was negative.  Will check chest x-ray today.  Going forward pending these results may need serial CT for ongoing surveillance.  Check sputum culture today.  Hold on additional antibiotics at this  time.  Plan   Patient Instructions  Chest x-ray today Check Sputum culture .  Add Ohtuvayre  neb Twice daily Add Flutter valve after neb treatments  Continue on Trelegy 1 puff daily, rinse after use Continue on Singulair  and Flonase  daily Continue on oxygen  3l/m -Wear all the time.  Order for POC -portable oxygen  device- Use POC at 4l/m  Albuterol  inhaler or DuoNeb as needed Follow-up with Dr. Diania Fortes in 6-8 weeks with PFT and As needed   Please contact office for sooner follow up if symptoms do not improve or worsen or seek emergency care       I spent  42  minutes dedicated to the care of this patient on the date of this encounter to include pre-visit review of records, face-to-face time with the patient discussing conditions above, post visit ordering of testing, clinical documentation with the electronic health record, making appropriate referrals as documented, and communicating necessary findings to members of the patients care team.    Roena Clark, NP 04/26/2024

## 2024-04-26 NOTE — Patient Instructions (Addendum)
 Chest x-ray today Check Sputum culture .  Add Ohtuvayre  neb Twice daily Add Flutter valve after neb treatments  Continue on Trelegy 1 puff daily, rinse after use Continue on Singulair  and Flonase  daily Continue on oxygen  3l/m -Wear all the time.  Order for POC -portable oxygen  device- Use POC at 4l/m  Albuterol  inhaler or DuoNeb as needed Follow-up with Dr. Diania Fortes in 6-8 weeks with PFT and As needed   Please contact office for sooner follow up if symptoms do not improve or worsen or seek emergency care

## 2024-04-26 NOTE — Assessment & Plan Note (Signed)
 Recent hospitalization with a multifocal pneumonia-CT chest showed bibasilar irregular consolidations and subpleural groundglass opacities.-Sputum AFB was negative.  Will check chest x-ray today.  Going forward pending these results may need serial CT for ongoing surveillance.  Check sputum culture today.  Hold on additional antibiotics at this time.  Plan   Patient Instructions  Chest x-ray today Check Sputum culture .  Add Ohtuvayre  neb Twice daily Add Flutter valve after neb treatments  Continue on Trelegy 1 puff daily, rinse after use Continue on Singulair  and Flonase  daily Continue on oxygen  3l/m -Wear all the time.  Order for POC -portable oxygen  device- Use POC at 4l/m  Albuterol  inhaler or DuoNeb as needed Follow-up with Dr. Diania Fortes in 6-8 weeks with PFT and As needed   Please contact office for sooner follow up if symptoms do not improve or worsen or seek emergency care

## 2024-04-26 NOTE — Assessment & Plan Note (Signed)
 Patient is encouraged on oxygen  compliance.  Patient education given on potential complications of hypoxemia.  She is to use oxygen  3 L continuously.  Order for POC device to use at 4 L pulsed oxygen  to maintain O2 saturation greater than 88 to 90%.  Plan  Patient Instructions  Chest x-ray today Check Sputum culture .  Add Ohtuvayre  neb Twice daily Add Flutter valve after neb treatments  Continue on Trelegy 1 puff daily, rinse after use Continue on Singulair  and Flonase  daily Continue on oxygen  3l/m -Wear all the time.  Order for POC -portable oxygen  device- Use POC at 4l/m  Albuterol  inhaler or DuoNeb as needed Follow-up with Dr. Diania Fortes in 6-8 weeks with PFT and As needed   Please contact office for sooner follow up if symptoms do not improve or worsen or seek emergency care

## 2024-04-27 ENCOUNTER — Encounter: Payer: Self-pay | Admitting: *Deleted

## 2024-04-27 ENCOUNTER — Other Ambulatory Visit: Payer: Self-pay | Admitting: *Deleted

## 2024-04-27 DIAGNOSIS — J189 Pneumonia, unspecified organism: Secondary | ICD-10-CM

## 2024-04-27 DIAGNOSIS — J441 Chronic obstructive pulmonary disease with (acute) exacerbation: Secondary | ICD-10-CM

## 2024-04-27 NOTE — Progress Notes (Signed)
 ATC patient x1.  No answer.  LVM for patient to return call.  [name] stated it is ok for me to double book her to get her seen in 4 weeks.  Mychart message sent as well.

## 2024-04-27 NOTE — Progress Notes (Signed)
 No availability on schedule until July for f/u.  [name] please advise when to have patient seen for follow up.  Double book?  Virtual visit on Friday afternoon?  [name] advised me to double book to get her seen.

## 2024-04-27 NOTE — Telephone Encounter (Signed)
 Pt called during lunch.  She said she will take the 11 am on 6/23.  Also advised pt to arrive 20 min earlier for the chest Xray.  Pt verbalized understanding.

## 2024-04-28 ENCOUNTER — Telehealth: Payer: Self-pay

## 2024-04-28 LAB — ACID FAST CULTURE WITH REFLEXED SENSITIVITIES (MYCOBACTERIA): Acid Fast Culture: NEGATIVE

## 2024-04-28 NOTE — Telephone Encounter (Signed)
 Received Ohtuvayre new start paperwork. Completed form and faxed with clinicals and insurance card copy to [facility]   Phone#: [phone] Fax#: [phone]

## 2024-05-02 ENCOUNTER — Ambulatory Visit: Payer: Self-pay | Admitting: Pulmonary Disease

## 2024-05-02 NOTE — Telephone Encounter (Signed)
 Routing to DOD please advise

## 2024-05-02 NOTE — Telephone Encounter (Addendum)
 Copied from CRM [id number]. Topic: Clinical - Red Word Triage >> May 02, 2024 11:19 AM Breanna Ramirez wrote: Red Word that prompted transfer to Nurse Triage: wheezing, SOB.  Pt seen last week and says she iss no better, not responding to medication  TRIAGE SUMMARY NOTE: Pt reporting that she is "feeling about the same" as she was when examined on 4/29 for her hospital follow up appt, pt has 3 days of prednisone  left and some augmentin  left but states she has had no improvement in symptoms from these. Pt confirms she is having more SOB than usual and wheezing intermittently but denies worsening from 4/29. Pt reporting that she is utilizing her continuous oxygen  therapy at 3L at home as instructed but not using her portable one since it is too heavy and makes her feel more winded to carry around. Pt reporting that in the mornings her pulse ox is in low 80s, then she'll do breathing treatment which gets her up to 88%, she will sometimes get to 90% as the day goes on. Pt reporting no improvement in symptoms with rescue inhaler when uses on occasion, only some improvement with nebulizer. Pt currently speaking in full sentences at work with no oxygen , denies more SOB at rest. Advised pt go to ED, pt refusing. Advised pt use her oxygen  therapy as prescribed, advised try inhaler rescue treatments 2-3x 20 min apart and if no improvement, get to hospital, advised that sending HP message to office for call back to pt with further recommendations, advised call back and/or go to hospital if worsening. Pt verbalized understanding.  E2C2 Pulmonary Triage - Initial Assessment Questions "Chief Complaint (e.g., cough, sob, wheezing, fever, chills, sweat or additional symptoms) *Go to specific symptom protocol after initial questions. Feeling about the same God-awful low 80s% then oxygen  and nebulizer then 88%, through the day it may make it to 90% with no oxygen  maybe SOB mainly with exertion, if just sitting still not bothered that  bad Wheezing comes and goes Got 3 days prednisone  left, augmentin , not had any improvement with them Not feeling improvement with inhaler, not much improvement with nebulizer Can get very little to come up, white Been wearing oxygen  all the time at home, not been using portable Speaking in full sentences currently Not going to hospital, didn't do anything for me the first time No chest pain, dizziness, weakness  "How long have symptoms been present?" Since 3/17  "Have you used your inhalers/maintenance medication?" Yes If yes, "What medications?" Rescue inhaler, trelegy  If inhaler, ask "How many puffs and how often?" Note: Review instructions on medication in the chart. Just as I need it, used one time yesterday, used it once today  OXYGEN : "Do you wear supplemental oxygen ?" Yes If yes, "How many liters are you supposed to use?" Mainly at night but do have portable but too heavy to carry it around, more winded trying to carry it 3L on the one at home, 4L on portable, not on oxygen  right now  "Do you monitor your oxygen  levels?" Yes If yes, "What is your reading (oxygen  level) today?" When got up 83-85% did breathing treatment went to 88% that is low  "What is your usual oxygen  saturation reading?"  (Note: Pulmonary O2 sats should be 90% or greater) Before all this 96-97%, been 80s and low 90s, nothing over 92%  Reason for Disposition  Patient sounds very sick or weak to the triager  Answer Assessment - Initial Assessment Questions 9. OTHER SYMPTOMS: "Do you  have any other symptoms?" (e.g., fever, change in sputum)     denies  Protocols used: COPD Oxygen  Monitoring and Hypoxia-A-AH

## 2024-05-02 NOTE — Telephone Encounter (Signed)
 I haven't seen this patient before, please check with Dr. Diania Fortes or [name] first to see if they can double book. Otherwise I can try and fit her in

## 2024-05-02 NOTE — Telephone Encounter (Signed)
 Ok to schedule for acute visit with Dr. Diania Fortes or APP

## 2024-05-02 NOTE — Telephone Encounter (Signed)
Routing to front office for scheduling.

## 2024-05-02 NOTE — Telephone Encounter (Signed)
 Patient is scheduled for 7/9 (PFT and OV).  Nothing further needed.

## 2024-05-02 NOTE — Telephone Encounter (Signed)
 Pt's application has been processed by [employer], unique identifier is [id number]. Will await further correspondence.

## 2024-05-03 NOTE — Telephone Encounter (Signed)
 I can see patient tomorrow 5/7 add in block slot or double book, please get chest x-ray before visit.  Thanks, JD

## 2024-05-03 NOTE — Progress Notes (Signed)
 Spoke with patient, scheduled to see Roena Clark NP on 5/29 at 1:30 pm with CXR prior.  Patient to call if she needs something prior to f/u.  Nothing further needed.

## 2024-05-04 ENCOUNTER — Ambulatory Visit (INDEPENDENT_AMBULATORY_CARE_PROVIDER_SITE_OTHER)

## 2024-05-04 ENCOUNTER — Ambulatory Visit (INDEPENDENT_AMBULATORY_CARE_PROVIDER_SITE_OTHER): Admitting: Pulmonary Disease

## 2024-05-04 ENCOUNTER — Encounter: Payer: Self-pay | Admitting: Pulmonary Disease

## 2024-05-04 VITALS — BP 132/63 | HR 58 | Ht 64.5 in | Wt 125.0 lb

## 2024-05-04 DIAGNOSIS — J441 Chronic obstructive pulmonary disease with (acute) exacerbation: Secondary | ICD-10-CM | POA: Diagnosis not present

## 2024-05-04 DIAGNOSIS — Z87891 Personal history of nicotine dependence: Secondary | ICD-10-CM | POA: Diagnosis not present

## 2024-05-04 DIAGNOSIS — J189 Pneumonia, unspecified organism: Secondary | ICD-10-CM | POA: Diagnosis not present

## 2024-05-04 LAB — C-REACTIVE PROTEIN: CRP: <1 mg/dL (ref 0.5–20.0)

## 2024-05-04 LAB — SEDIMENTATION RATE: Sed Rate: 8 mm/h (ref 0–30)

## 2024-05-04 MED ORDER — LEVOFLOXACIN 750 MG PO TABS: 750.0000 mg | ORAL_TABLET | Freq: Every day | ORAL | 0 refills | Status: DC

## 2024-05-04 MED ORDER — PREDNISONE 10 MG PO TABS: ORAL_TABLET | ORAL | 0 refills | Status: DC

## 2024-05-04 NOTE — Telephone Encounter (Signed)
 Can we call this pt again and try to get her scheduled to see Dr Diania Fortes if able?

## 2024-05-04 NOTE — Progress Notes (Signed)
 Synopsis: Referred in February 2023 for wheezing by Aurther Lefevre, NP  Subjective:   PATIENT ID: Breanna Ramirez GENDER: female DOB: 25-May-1956, MRN: [id number]  HPI  Chief Complaint  Patient presents with   Follow-up   Breanna Ramirez is a 68 year old woman, former smoker with COPD and GERD who returns to pulmonary clinic for COPD.   She has persistent wheezing and a non-productive cough since her hospitalization on March 14, 2024. Initially, she experienced pain in the lower right chest, now located in the upper right chest with associated wheezing. Despite treatment with amoxicillin , azithromycin , prednisone , and Augmentin , symptoms have only slightly improved. Breanna Ramirez  was prescribed but not yet approved by insurance.  She experiences night sweats, which began before her hospitalization and continue. She denies fever, chills, or difficulty swallowing. No joint pains or muscle aches.  Her current medications include Trelegy, one puff daily, and nebulizer treatments twice a day. She uses a Flutter device regularly and has one remaining prednisone  pill.  She quit smoking in 2019 and occasionally smells smoke from neighbors in her apartment. No current smoking, vaping, or exposure to secondhand smoke within her home. She has no pets and does not use down pillows or comforters.   OV 08/25/22 ONO since last visit showed she qualified for night time oxygen . She is considering formal sleep study when she has more time away from work.  She was treated with levaquin  for 5 days and then augmentin  for 10 days in July by her nurse practitioner at work for on going cough.   She reports her breathing is well controlled at this time but continues to have some wheezing.   She has run out of montelukast . She has significant post nasal drainage. GERD is well controlled at this time.   OV 06/02/22 She was admitted 5/21 to 5/24 for COPD exacerbation. She was discharged with supplemental oxygen  3L. Using  at night mainly.   She continues to have cough with some sputum production. She has wheezing but it is much improved. She is not quite back to baseline yet. She is concerned about returning to work where it is very hot and humid in [employer]. She is concerned it will aggravate her breathing again.   OV 05/12/22 She was treated for COPD exacerbation at last visit with prednisone  taper with Zpak. She was started on singulair . She continues on symbicort 2 puffs twice dialy and as needed duoneb treatments. She reports relief from the prednisone  taper but of recent she has increased cough, sputum production and wheezing.   PFTs show moderate restriction and diffusion defect. Flow volume loop does appear obstructed.   OV 02/12/22 She reports being treated in September 2022 for pneumonia and later developed COVID-19 infection in late October 2022 in which she was treated with Paxlovid.  She again was sick with pneumonia in December 2022 and responded well to antibiotics, steroids and using Symbicort inhaler.  She tapered herself off the Symbicort inhaler in January after she was feeling better.  She started to have wheezing towards the end of January along with shortness of breath.  She denies any cough or sputum production.  She reports that she notices the wheezing mostly at nighttime when going to bed.  She quit smoking in 2019.  She has a 40+ pack year smoking history.  She has significant secondhand smoke exposure in childhood.  She currently works as a Solicitor at a [employer]  She denies any significant dust or chemical exposures.  She does have significant reflux and seasonal allergies.  She reports springtime is the most difficult season for her due to the pollen.  She denies any increased cough or dysphagia when eating or drinking.  She denies the sensation of food getting stuck in her throat or chest.  Her father had asthma and her maternal grandfather had asthma.  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   Past Medical History:  Diagnosis Date   COPD (chronic obstructive pulmonary disease) (HCC)    Essential hypertension    GERD (gastroesophageal reflux disease)    Pneumonia 05/2017   Respiratory failure with hypoxia (HCC) 05/2017     Family History  Problem Relation Age of Onset   Cancer Father      Social History   Socioeconomic History   Marital status: Single    Spouse name: Not on file   Number of children: 1   Years of education: Not on file   Highest education level: Not on file  Occupational History   Occupation: Works in Designer, fashion/clothing.  Tobacco Use   Smoking status: Former    Current packs/day: 0.00    Average packs/day: 1 pack/day for 45.0 years (45.0 ttl pk-yrs)    Types: Cigarettes    Start date: 12/29/1972    Quit date: 12/29/2017    Years since quitting: 6.3   Smokeless tobacco: Never  Vaping Use   Vaping status: Never Used  Substance and Sexual Activity   Alcohol use: No   Drug use: No   Sexual activity: Never    Birth control/protection: Post-menopausal  Other Topics Concern   Not on file  Social History Narrative   Not on file   Social Drivers of Health   Financial Resource Strain: Not on file  Food Insecurity: No Food Insecurity (03/14/2024)   Hunger Vital Sign    Worried About Running Out of Food in the Last Year: Never true    Ran Out of Food in the Last Year: Never true  Transportation Needs: No Transportation Needs (03/14/2024)   PRAPARE - Administrator, Civil Service (Medical): No    Lack of Transportation (Non-Medical): No  Physical Activity: Not on file  Stress: Not on file  Social Connections: Unknown (03/15/2024)   Social Connection and Isolation Panel [NHANES]    Frequency of Communication with Friends and Family: Twice a week    Frequency of Social Gatherings with Friends and Family: Twice a week    Attends Religious Services: 1 to 4 times per year     Active Member of [employer] or Organizations: No    Attends Banker Meetings: Never    Marital Status: Patient declined  Catering manager Violence: Not At Risk (03/14/2024)   Humiliation, Afraid, Rape, and Kick questionnaire    Fear of Current or Ex-Partner: No    Emotionally Abused: No    Physically Abused: No    Sexually Abused: No     Allergies  Allergen Reactions   Codeine Nausea And Vomiting     Outpatient Medications Prior to Visit  Medication Sig Dispense Refill   albuterol  (PROVENTIL  HFA;VENTOLIN  HFA) 108 (90 Base) MCG/ACT inhaler Inhale 2 puffs into the lungs every 6 (six) hours as needed for wheezing or shortness of breath.     amoxicillin -clavulanate (AUGMENTIN ) 875-125 MG tablet Take 1 tablet by mouth 2 (two) times daily. 20 tablet 0   Ensifentrine  (Breanna Ramirez ) 3 MG/2.5ML  SUSP Inhale 1 Inhalation into the lungs 2 (two) times daily. 150 mL 11   esomeprazole  (NEXIUM ) 40 MG capsule Take 1 capsule (40 mg total) by mouth daily. 90 capsule 4   fluticasone  (FLONASE ) 50 MCG/ACT nasal spray Place 1 spray into both nostrils daily.     Fluticasone -Umeclidin-Vilant (TRELEGY ELLIPTA ) 200-62.5-25 MCG/ACT AEPB Inhale 1 puff into the lungs daily. 28 each 11   hydrochlorothiazide  (HYDRODIURIL ) 25 MG tablet Take 1 tablet (25 mg total) by mouth daily. 90 tablet 3   ipratropium-albuterol  (DUONEB) 0.5-2.5 (3) MG/3ML SOLN SMARTSIG:1 Ampule(s) Via Nebulizer 3 Times Daily PRN (Patient taking differently: Take 3 mLs by nebulization as needed (wheezing/SOB).) 360 mL 6   montelukast  (SINGULAIR ) 10 MG tablet Take 1 tablet (10 mg total) by mouth at bedtime. 30 tablet 11   simvastatin  (ZOCOR ) 40 MG tablet Take 1 tablet (40 mg total) by mouth at bedtime. 90 tablet 3   sodium chloride  HYPERTONIC 3 % nebulizer solution Take 4 mLs by nebulization 3 (three) times daily. 750 mL 12   predniSONE  (DELTASONE ) 10 MG tablet 4 tabs for 2 days, then 3 tabs for 2 days, 2 tabs for 2 days, then 1 tab for 2 days,  then stop 20 tablet 0   No facility-administered medications prior to visit.   Review of Systems  Constitutional:  Negative for chills, fever, malaise/fatigue and weight loss.  HENT:  Positive for congestion. Negative for sinus pain and sore throat.   Eyes: Negative.   Respiratory:  Positive for cough, sputum production, shortness of breath and wheezing. Negative for hemoptysis.   Cardiovascular:  Negative for chest pain, palpitations, orthopnea, claudication and leg swelling.  Gastrointestinal:  Negative for abdominal pain, heartburn, nausea and vomiting.  Genitourinary: Negative.   Musculoskeletal:  Negative for joint pain and myalgias.  Skin:  Negative for rash.  Neurological:  Negative for weakness and headaches.  Endo/Heme/Allergies: Negative.   Psychiatric/Behavioral: Negative.      Objective:   Vitals:   05/04/24 1346  BP: 132/63  Pulse: (!) 58  SpO2: 91%  Weight: 125 lb (56.7 kg)  Height: 5' 4.5" (1.638 m)   Physical Exam Constitutional:      General: She is not in acute distress.    Appearance: She is not ill-appearing.  HENT:     Head: Normocephalic and atraumatic.  Eyes:     General: No scleral icterus.    Conjunctiva/sclera: Conjunctivae normal.  Cardiovascular:     Rate and Rhythm: Normal rate and regular rhythm.     Pulses: Normal pulses.     Heart sounds: Normal heart sounds. No murmur heard. Pulmonary:     Effort: Pulmonary effort is normal.     Breath sounds: Wheezing present. No rhonchi or rales.  Musculoskeletal:     Right lower leg: No edema.     Left lower leg: No edema.  Skin:    General: Skin is warm and dry.  Neurological:     Mental Status: She is alert.    CBC    Component Value Date/Time   WBC 9.9 03/16/2024 0614   RBC 3.67 (L) 03/16/2024 0614   HGB 10.2 (L) 03/16/2024 0614   HCT 32.9 (L) 03/16/2024 0614   PLT 271 03/16/2024 0614   MCV 89.6 03/16/2024 0614   MCH 27.8 03/16/2024 0614   MCHC 31.0 03/16/2024 0614   RDW 15.1  03/16/2024 0614   LYMPHSABS 0.7 03/16/2024 0614   MONOABS 0.7 03/16/2024 0614   EOSABS 0.0 03/16/2024 0454  BASOSABS 0.0 03/16/2024 0614      Latest Ref Rng & Units 03/16/2024    6:14 AM 03/15/2024    5:26 AM 03/14/2024    1:52 PM  BMP  Glucose 70 - 99 mg/dL 161  85  096   BUN 8 - 23 mg/dL 14  11  13    Creatinine 0.44 - 1.00 mg/dL 0.45  4.09  8.11   Sodium 135 - 145 mmol/L 135  138  135   Potassium 3.5 - 5.1 mmol/L 3.8  3.5  4.2   Chloride 98 - 111 mmol/L 98  100  98   CO2 22 - 32 mmol/L 28  28  28    Calcium 8.9 - 10.3 mg/dL 9.4  9.3  9.5    Chest imaging: CXR 05/18/22 Cardiomediastinal silhouette is stable. No pneumothorax. Blunting of the costophrenic angles unchanged since September 25, 2020. Emphysematous changes identified in the lungs. Interstitial prominence is similar in the interval. Chronic opacity in the lingula and right middle lobe was noted to represent atelectasis on the October 31, 2021 CT scan. The opacity in the right base is similar in appearance compared to September 25, 2020.  CTA Chest PE 10/31/21 1.  No evidence of pulmonary embolism. 2. Low lung volumes with chronic bibasilar volume loss and areas of atelectasis/scarring. Mosaic attenuation is indicative of air trapping. 3. Debris in the esophagus, suggesting dysmotility or gastroesophageal reflux. 4. Age advanced coronary artery atherosclerosis. Recommend assessment of coronary risk factors and consideration of medical therapy. 5. Aortic atherosclerosis  PFT:    Latest Ref Rng & Units 05/12/2022   10:47 AM  PFT Results  FVC-Pre L 1.67   FVC-Predicted Pre % 51   FVC-Post L 1.72   FVC-Predicted Post % 52   Pre FEV1/FVC % % 77   Post FEV1/FCV % % 79   FEV1-Pre L 1.28   FEV1-Predicted Pre % 51   FEV1-Post L 1.36   DLCO uncorrected ml/min/mmHg 8.38   DLCO UNC% % 41   DLCO corrected ml/min/mmHg 8.38   DLCO COR %Predicted % 41   DLVA Predicted % 56   TLC L 4.06   TLC % Predicted % 78   RV %  Predicted % 104     Labs:  Path:  Echo:  Heart Catheterization:  Assessment & Plan:   Multifocal pneumonia - Plan: C-reactive protein, Sed Rate (ESR), Hypersensitivity Pneumonitis, Rheumatoid factor, Cyclic citrul peptide antibody, IgG, Anti-Smith antibody, ANA, ANCA screen with reflex titer, Sjogren's syndrome antibods(ssa + ssb), CT CHEST HIGH RESOLUTION, levofloxacin  (LEVAQUIN ) 750 MG tablet, predniSONE  (DELTASONE ) 10 MG tablet, C-reactive protein, Sed Rate (ESR), Hypersensitivity Pneumonitis, Rheumatoid factor, Cyclic citrul peptide antibody, IgG, Anti-Smith antibody, ANA, ANCA screen with reflex titer, Sjogren's syndrome antibods(ssa + ssb)  COPD with acute exacerbation (HCC) - Plan: predniSONE  (DELTASONE ) 10 MG tablet  Discussion: Breanna Ramirez is a 68 year old woman, former smoker with COPD and GERD who returns to pulmonary clinic for COPD.   Pneumonia Persistent pneumonia with right-sided wheezes and night sweats. Minimal improvement with prior antibiotics and steroids. Imaging shows right-sided infiltrates and consolidation. Consider Pseudomonas infection or inflammatory pneumonia. Discussed potential need for prolonged steroids if inflammatory. - Order high-resolution CT scan for comparison. - Check inflammatory markers to differentiate pneumonia type. - Prescribe Levaquin  for potential Pseudomonas. - Initiate prednisone  taper starting at 40 mg daily for 7 days, then decrease by 10 mg every 7 days, adjust based on lab results. - Cancel x-ray for May 29, reschedule  for early June.  COPD exacerbation - steroid taper - levaquin  - continue trelegy ellipta  200, 1 puff daily and as needed albuterol   Follow up in 1 month  Duaine German, MD [facility] Office: [phone]   Current Outpatient Medications:    albuterol  (PROVENTIL  HFA;VENTOLIN  HFA) 108 (90 Base) MCG/ACT inhaler, Inhale 2 puffs into the lungs every 6 (six) hours as needed for wheezing or  shortness of breath., Disp: , Rfl:    amoxicillin -clavulanate (AUGMENTIN ) 875-125 MG tablet, Take 1 tablet by mouth 2 (two) times daily., Disp: 20 tablet, Rfl: 0   Ensifentrine  (Breanna Ramirez ) 3 MG/2.5ML SUSP, Inhale 1 Inhalation into the lungs 2 (two) times daily., Disp: 150 mL, Rfl: 11   esomeprazole  (NEXIUM ) 40 MG capsule, Take 1 capsule (40 mg total) by mouth daily., Disp: 90 capsule, Rfl: 4   fluticasone  (FLONASE ) 50 MCG/ACT nasal spray, Place 1 spray into both nostrils daily., Disp: , Rfl:    Fluticasone -Umeclidin-Vilant (TRELEGY ELLIPTA ) 200-62.5-25 MCG/ACT AEPB, Inhale 1 puff into the lungs daily., Disp: 28 each, Rfl: 11   hydrochlorothiazide  (HYDRODIURIL ) 25 MG tablet, Take 1 tablet (25 mg total) by mouth daily., Disp: 90 tablet, Rfl: 3   ipratropium-albuterol  (DUONEB) 0.5-2.5 (3) MG/3ML SOLN, SMARTSIG:1 Ampule(s) Via Nebulizer 3 Times Daily PRN (Patient taking differently: Take 3 mLs by nebulization as needed (wheezing/SOB).), Disp: 360 mL, Rfl: 6   levofloxacin  (LEVAQUIN ) 750 MG tablet, Take 1 tablet (750 mg total) by mouth daily., Disp: 10 tablet, Rfl: 0   montelukast  (SINGULAIR ) 10 MG tablet, Take 1 tablet (10 mg total) by mouth at bedtime., Disp: 30 tablet, Rfl: 11   predniSONE  (DELTASONE ) 10 MG tablet, Take 4 tablets (40 mg total) by mouth daily with breakfast for 7 days, THEN 3 tablets (30 mg total) daily with breakfast for 7 days, THEN 2 tablets (20 mg total) daily with breakfast for 7 days, THEN 1 tablet (10 mg total) daily with breakfast for 7 days., Disp: 70 tablet, Rfl: 0   simvastatin  (ZOCOR ) 40 MG tablet, Take 1 tablet (40 mg total) by mouth at bedtime., Disp: 90 tablet, Rfl: 3   sodium chloride  HYPERTONIC 3 % nebulizer solution, Take 4 mLs by nebulization 3 (three) times daily., Disp: 750 mL, Rfl: 12

## 2024-05-04 NOTE — Patient Instructions (Addendum)
 Start prednisone  taper 40 mg daily x 7 days 30mg  daily x 7 days 20mg  daily x 7 days 10mg  daily x 7 days  Start levaquin antibiotic 1 tab daily for 10 days  Continue trelegy ellipta  1 puff daily - rinse mouth out after each use  Use albuterol  or duonebs as needed  We will schedule you for CT Chest scan  We will check labs today  Follow up in 1 month

## 2024-05-08 ENCOUNTER — Encounter: Payer: Self-pay | Admitting: Pulmonary Disease

## 2024-05-09 LAB — HYPERSENSITIVITY PNEUMONITIS
A. Pullulans Abs: NEGATIVE
A.Fumigatus #1 Abs: NEGATIVE
Micropolyspora faeni, IgG: NEGATIVE
Pigeon Serum Abs: NEGATIVE
Thermoact. Saccharii: NEGATIVE
Thermoactinomyces vulgaris, IgG: NEGATIVE

## 2024-05-10 LAB — RHEUMATOID FACTOR: Rheumatoid fact SerPl-aCnc: <10 [IU]/mL (ref <14)

## 2024-05-10 LAB — ANCA SCREEN W REFLEX TITER: ANCA SCREEN: NEGATIVE

## 2024-05-10 LAB — ANTI-NUCLEAR AB-TITER (ANA TITER): ANA Titer 1: 1:40 {titer} — ABNORMAL HIGH

## 2024-05-10 LAB — CYCLIC CITRUL PEPTIDE ANTIBODY, IGG: Cyclic Citrullin Peptide Ab: <16 U

## 2024-05-10 LAB — ANTI-SMITH ANTIBODY: ENA SM Ab Ser-aCnc: <1 AI

## 2024-05-10 LAB — ANA: Anti Nuclear Antibody (ANA): POSITIVE — AB

## 2024-05-10 LAB — SJOGREN'S SYNDROME ANTIBODS(SSA + SSB)
SSA (Ro) (ENA) Antibody, IgG: <1 AI
SSB (La) (ENA) Antibody, IgG: <1 AI

## 2024-05-15 ENCOUNTER — Other Ambulatory Visit: Payer: Self-pay

## 2024-05-15 ENCOUNTER — Encounter (HOSPITAL_COMMUNITY): Payer: Self-pay

## 2024-05-15 ENCOUNTER — Inpatient Hospital Stay (HOSPITAL_COMMUNITY)
Admission: EM | Admit: 2024-05-15 | Discharge: 2024-05-18 | DRG: 205 | Disposition: A | Discharge status: Home / Self Care | Attending: Internal Medicine | Admitting: Internal Medicine

## 2024-05-15 ENCOUNTER — Emergency Department (HOSPITAL_COMMUNITY)

## 2024-05-15 DIAGNOSIS — E785 Hyperlipidemia, unspecified: Secondary | ICD-10-CM | POA: Diagnosis present

## 2024-05-15 DIAGNOSIS — J189 Pneumonia, unspecified organism: Principal | ICD-10-CM | POA: Diagnosis present

## 2024-05-15 DIAGNOSIS — Z8701 Personal history of pneumonia (recurrent): Secondary | ICD-10-CM

## 2024-05-15 DIAGNOSIS — T17590A Other foreign object in bronchus causing asphyxiation, initial encounter: Principal | ICD-10-CM | POA: Diagnosis present

## 2024-05-15 DIAGNOSIS — J9621 Acute and chronic respiratory failure with hypoxia: Secondary | ICD-10-CM | POA: Diagnosis present

## 2024-05-15 DIAGNOSIS — D509 Iron deficiency anemia, unspecified: Secondary | ICD-10-CM | POA: Diagnosis present

## 2024-05-15 DIAGNOSIS — Z79899 Other long term (current) drug therapy: Secondary | ICD-10-CM | POA: Diagnosis not present

## 2024-05-15 DIAGNOSIS — I110 Hypertensive heart disease with heart failure: Secondary | ICD-10-CM | POA: Diagnosis present

## 2024-05-15 DIAGNOSIS — J84116 Cryptogenic organizing pneumonia: Principal | ICD-10-CM | POA: Diagnosis present

## 2024-05-15 DIAGNOSIS — Z87891 Personal history of nicotine dependence: Secondary | ICD-10-CM | POA: Diagnosis not present

## 2024-05-15 DIAGNOSIS — J9811 Atelectasis: Secondary | ICD-10-CM | POA: Diagnosis present

## 2024-05-15 DIAGNOSIS — J8410 Pulmonary fibrosis, unspecified: Secondary | ICD-10-CM | POA: Diagnosis present

## 2024-05-15 DIAGNOSIS — Z9981 Dependence on supplemental oxygen: Secondary | ICD-10-CM | POA: Diagnosis not present

## 2024-05-15 DIAGNOSIS — J439 Emphysema, unspecified: Secondary | ICD-10-CM | POA: Diagnosis present

## 2024-05-15 DIAGNOSIS — J4489 Other specified chronic obstructive pulmonary disease: Secondary | ICD-10-CM | POA: Diagnosis present

## 2024-05-15 DIAGNOSIS — Z720 Tobacco use: Secondary | ICD-10-CM | POA: Diagnosis present

## 2024-05-15 DIAGNOSIS — K219 Gastro-esophageal reflux disease without esophagitis: Secondary | ICD-10-CM | POA: Diagnosis present

## 2024-05-15 DIAGNOSIS — Z1152 Encounter for screening for COVID-19: Secondary | ICD-10-CM

## 2024-05-15 DIAGNOSIS — I10 Essential (primary) hypertension: Secondary | ICD-10-CM | POA: Diagnosis present

## 2024-05-15 DIAGNOSIS — E871 Hypo-osmolality and hyponatremia: Secondary | ICD-10-CM | POA: Diagnosis present

## 2024-05-15 DIAGNOSIS — J449 Chronic obstructive pulmonary disease, unspecified: Secondary | ICD-10-CM | POA: Diagnosis present

## 2024-05-15 DIAGNOSIS — I5032 Chronic diastolic (congestive) heart failure: Secondary | ICD-10-CM | POA: Diagnosis present

## 2024-05-15 LAB — COMPREHENSIVE METABOLIC PANEL WITH GFR
ALT: 10 U/L (ref 0–44)
AST: 13 U/L — ABNORMAL LOW (ref 15–41)
Albumin: 3.3 g/dL — ABNORMAL LOW (ref 3.5–5.0)
Alkaline Phosphatase: 55 U/L (ref 38–126)
Anion gap: 11 (ref 5–15)
BUN: 13 mg/dL (ref 8–23)
CO2: 27 mmol/L (ref 22–32)
Calcium: 9.5 mg/dL (ref 8.9–10.3)
Chloride: 96 mmol/L — ABNORMAL LOW (ref 98–111)
Creatinine, Ser: 0.85 mg/dL (ref 0.44–1.00)
GFR, Estimated: >60 mL/min (ref >60)
Glucose, Bld: 113 mg/dL — ABNORMAL HIGH (ref 70–99)
Potassium: 3.5 mmol/L (ref 3.5–5.1)
Sodium: 134 mmol/L — ABNORMAL LOW (ref 135–145)
Total Bilirubin: 0.5 mg/dL (ref 0.0–1.2)
Total Protein: 6.3 g/dL — ABNORMAL LOW (ref 6.5–8.1)

## 2024-05-15 LAB — MAGNESIUM: Magnesium: 2 mg/dL (ref 1.7–2.4)

## 2024-05-15 LAB — I-STAT CHEM 8, ED
BUN: 11 mg/dL (ref 8–23)
Calcium, Ion: 1.14 mmol/L — ABNORMAL LOW (ref 1.15–1.40)
Chloride: 96 mmol/L — ABNORMAL LOW (ref 98–111)
Creatinine, Ser: 0.9 mg/dL (ref 0.44–1.00)
Glucose, Bld: 108 mg/dL — ABNORMAL HIGH (ref 70–99)
HCT: 36 % (ref 36.0–46.0)
Hemoglobin: 12.2 g/dL (ref 12.0–15.0)
Potassium: 3.6 mmol/L (ref 3.5–5.1)
Sodium: 133 mmol/L — ABNORMAL LOW (ref 135–145)
TCO2: 28 mmol/L (ref 22–32)

## 2024-05-15 LAB — CBC
HCT: 35 % — ABNORMAL LOW (ref 36.0–46.0)
Hemoglobin: 10.7 g/dL — ABNORMAL LOW (ref 12.0–15.0)
MCH: 26.6 pg (ref 26.0–34.0)
MCHC: 30.6 g/dL (ref 30.0–36.0)
MCV: 86.8 fL (ref 80.0–100.0)
Platelets: 356 10*3/uL (ref 150–400)
RBC: 4.03 MIL/uL (ref 3.87–5.11)
RDW: 16.5 % — ABNORMAL HIGH (ref 11.5–15.5)
WBC: 13.9 10*3/uL — ABNORMAL HIGH (ref 4.0–10.5)
nRBC: 0 % (ref 0.0–0.2)

## 2024-05-15 LAB — C-REACTIVE PROTEIN: CRP: 8.8 mg/dL — ABNORMAL HIGH (ref <1.0)

## 2024-05-15 LAB — RESPIRATORY PANEL BY PCR

## 2024-05-15 LAB — PHOSPHORUS: Phosphorus: 3.2 mg/dL (ref 2.5–4.6)

## 2024-05-15 LAB — RESP PANEL BY RT-PCR (RSV, FLU A&B, COVID)  RVPGX2
Influenza A by PCR: NEGATIVE
Influenza B by PCR: NEGATIVE
Resp Syncytial Virus by PCR: NEGATIVE
SARS Coronavirus 2 by RT PCR: NEGATIVE

## 2024-05-15 LAB — PROCALCITONIN
Procalcitonin: <0.1 ng/mL
Procalcitonin: <0.1 ng/mL

## 2024-05-15 LAB — STREP PNEUMONIAE URINARY ANTIGEN: Strep Pneumo Urinary Antigen: NEGATIVE

## 2024-05-15 LAB — SEDIMENTATION RATE: Sed Rate: 12 mm/h (ref 0–22)

## 2024-05-15 LAB — BRAIN NATRIURETIC PEPTIDE: B Natriuretic Peptide: 44.5 pg/mL (ref 0.0–100.0)

## 2024-05-15 MED ORDER — REVEFENACIN 175 MCG/3ML IN SOLN
175.0000 ug | Freq: Every day | RESPIRATORY_TRACT | Status: DC
  Administered 2024-05-16: 175 ug via RESPIRATORY_TRACT
  Filled 2024-05-15: qty 3

## 2024-05-15 MED ORDER — SODIUM CHLORIDE 0.9 % IV SOLN
1.0000 g | Freq: Once | INTRAVENOUS | Status: AC
  Administered 2024-05-15: 1 g via INTRAVENOUS
  Filled 2024-05-15: qty 10

## 2024-05-15 MED ORDER — IPRATROPIUM-ALBUTEROL 0.5-2.5 (3) MG/3ML IN SOLN
3.0000 mL | RESPIRATORY_TRACT | Status: DC | PRN
  Administered 2024-05-15: 3 mL via RESPIRATORY_TRACT
  Filled 2024-05-15: qty 3

## 2024-05-15 MED ORDER — METHYLPREDNISOLONE SODIUM SUCC 40 MG IJ SOLR
40.0000 mg | Freq: Two times a day (BID) | INTRAMUSCULAR | Status: DC
  Administered 2024-05-15 – 2024-05-18 (×6): 40 mg via INTRAVENOUS
  Filled 2024-05-15 (×6): qty 1

## 2024-05-15 MED ORDER — ACETAMINOPHEN 325 MG PO TABS: 650.0000 mg | ORAL_TABLET | Freq: Four times a day (QID) | ORAL | Status: DC | PRN

## 2024-05-15 MED ORDER — IOHEXOL 350 MG/ML SOLN
75.0000 mL | Freq: Once | INTRAVENOUS | Status: AC | PRN
  Administered 2024-05-15: 75 mL via INTRAVENOUS

## 2024-05-15 MED ORDER — LACTATED RINGERS IV BOLUS: 500.0000 mL | Freq: Once | INTRAVENOUS | Status: DC

## 2024-05-15 MED ORDER — PANTOPRAZOLE SODIUM 40 MG PO TBEC
40.0000 mg | DELAYED_RELEASE_TABLET | Freq: Every day | ORAL | Status: DC
  Administered 2024-05-15: 40 mg via ORAL
  Filled 2024-05-15: qty 1

## 2024-05-15 MED ORDER — POTASSIUM CHLORIDE CRYS ER 10 MEQ PO TBCR: 10.0000 meq | EXTENDED_RELEASE_TABLET | Freq: Every day | ORAL | Status: DC

## 2024-05-15 MED ORDER — MONTELUKAST SODIUM 10 MG PO TABS
10.0000 mg | ORAL_TABLET | Freq: Every day | ORAL | Status: DC
Start: 2024-05-15 — End: 2024-05-18
  Administered 2024-05-15 – 2024-05-17 (×3): 10 mg via ORAL
  Filled 2024-05-15 (×3): qty 1

## 2024-05-15 MED ORDER — BUDESONIDE 0.5 MG/2ML IN SUSP
0.5000 mg | Freq: Two times a day (BID) | RESPIRATORY_TRACT | Status: DC
Start: 2024-05-15 — End: 2024-05-18
  Administered 2024-05-15 – 2024-05-18 (×6): 0.5 mg via RESPIRATORY_TRACT
  Filled 2024-05-15 (×7): qty 2

## 2024-05-15 MED ORDER — SODIUM CHLORIDE 0.9 % IV SOLN: 500.0000 mg | Freq: Once | INTRAVENOUS | Status: DC

## 2024-05-15 MED ORDER — POTASSIUM CHLORIDE CRYS ER 20 MEQ PO TBCR
20.0000 meq | EXTENDED_RELEASE_TABLET | Freq: Once | ORAL | Status: AC
  Administered 2024-05-15: 20 meq via ORAL
  Filled 2024-05-15: qty 1

## 2024-05-15 MED ORDER — SIMVASTATIN 40 MG PO TABS
40.0000 mg | ORAL_TABLET | Freq: Every day | ORAL | Status: DC
  Administered 2024-05-15 – 2024-05-17 (×3): 40 mg via ORAL
  Filled 2024-05-15 (×3): qty 1

## 2024-05-15 MED ORDER — ONDANSETRON HCL 4 MG/2ML IJ SOLN
4.0000 mg | Freq: Four times a day (QID) | INTRAMUSCULAR | Status: DC | PRN
Start: 2024-05-15 — End: 2024-05-18
  Administered 2024-05-16: 4 mg via INTRAVENOUS

## 2024-05-15 MED ORDER — ARFORMOTEROL TARTRATE 15 MCG/2ML IN NEBU
15.0000 ug | INHALATION_SOLUTION | Freq: Two times a day (BID) | RESPIRATORY_TRACT | Status: DC
  Administered 2024-05-15 – 2024-05-18 (×6): 15 ug via RESPIRATORY_TRACT
  Filled 2024-05-15 (×7): qty 2

## 2024-05-15 MED ORDER — HYDROCHLOROTHIAZIDE 25 MG PO TABS: 25.0000 mg | ORAL_TABLET | Freq: Every day | ORAL | Status: DC

## 2024-05-15 MED ORDER — ALBUTEROL SULFATE (2.5 MG/3ML) 0.083% IN NEBU: 2.5000 mg | INHALATION_SOLUTION | RESPIRATORY_TRACT | Status: DC | PRN

## 2024-05-15 MED ORDER — GUAIFENESIN-DM 100-10 MG/5ML PO SYRP
5.0000 mL | ORAL_SOLUTION | ORAL | Status: DC | PRN
Start: 2024-05-15 — End: 2024-05-18
  Administered 2024-05-15: 5 mL via ORAL
  Filled 2024-05-15: qty 5

## 2024-05-15 MED ORDER — IPRATROPIUM-ALBUTEROL 0.5-2.5 (3) MG/3ML IN SOLN
3.0000 mL | Freq: Once | RESPIRATORY_TRACT | Status: AC
  Administered 2024-05-15: 3 mL via RESPIRATORY_TRACT
  Filled 2024-05-15: qty 3

## 2024-05-15 MED ORDER — LACTATED RINGERS IV SOLN: INTRAVENOUS | Status: AC

## 2024-05-15 MED ORDER — ONDANSETRON HCL 4 MG PO TABS: 4.0000 mg | ORAL_TABLET | Freq: Four times a day (QID) | ORAL | Status: DC | PRN

## 2024-05-15 MED ORDER — ACETAMINOPHEN 650 MG RE SUPP: 650.0000 mg | Freq: Four times a day (QID) | RECTAL | Status: DC | PRN

## 2024-05-15 MED ORDER — SODIUM CHLORIDE 0.9 % IV SOLN: 500.0000 mg | INTRAVENOUS | Status: DC

## 2024-05-15 MED ORDER — HYDROCHLOROTHIAZIDE 12.5 MG PO TABS: 12.5000 mg | ORAL_TABLET | Freq: Every morning | ORAL | Status: DC

## 2024-05-15 MED ORDER — SODIUM CHLORIDE 0.9 % IV SOLN
2.0000 g | Freq: Three times a day (TID) | INTRAVENOUS | Status: DC
  Administered 2024-05-15 – 2024-05-16 (×2): 2 g via INTRAVENOUS
  Filled 2024-05-15 (×2): qty 12.5

## 2024-05-15 MED ORDER — IPRATROPIUM-ALBUTEROL 0.5-2.5 (3) MG/3ML IN SOLN: 3.0000 mL | Freq: Four times a day (QID) | RESPIRATORY_TRACT | Status: DC

## 2024-05-15 NOTE — ED Triage Notes (Signed)
 Pt c/o sob. Had PNA in March and states she just has not gotten better since then. Pt wear home oxygen  if needed and sleeps with it but has been having to wearing it at 4L [place] today due to the sob. Pt denies any chest pain but is having chest pain.

## 2024-05-15 NOTE — ED Provider Notes (Signed)
 [place] EMERGENCY DEPARTMENT AT [facility] Provider Note   CSN: [id number] Arrival date & time: 05/15/24  1020     History  Chief Complaint  Patient presents with   Shortness of Breath    Breanna Ramirez is a 68 y.o. female with PMHx COPD, respiratory failure, HTN, GERD, CHF, who presents to ED concerned for SOB and cough x2 months. Patient diagnosed with PNA in March 2025 when symptoms first started and was discharged home with ABX. Patient noting that she has had to use her home O2 machine more recently in March - especially when walking. Patient then stating that her PCP saw her again around 10 days ago and sent patient home with Levaquin  and steroid pack - patient's ABX ended yesterday, she is still tapering her steroids.   Denies fever, chest pain, nausea, vomiting, diarrhea, dysuria, hematuria, hematochezia.    Shortness of Breath      Home Medications Prior to Admission medications   Medication Sig Start Date End Date Taking? Authorizing Provider  albuterol  (PROVENTIL  HFA;VENTOLIN  HFA) 108 (90 Base) MCG/ACT inhaler Inhale 2 puffs into the lungs every 6 (six) hours as needed for wheezing or shortness of breath.    [provider]  amoxicillin -clavulanate (AUGMENTIN ) 875-125 MG tablet Take 1 tablet by mouth 2 (two) times daily. 04/26/24   Parrett, Macdonald Savoy, NP  Ensifentrine  (OHTUVAYRE ) 3 MG/2.5ML SUSP Inhale 1 Inhalation into the lungs 2 (two) times daily. 04/26/24   Parrett, Macdonald Savoy, NP  esomeprazole  (NEXIUM ) 40 MG capsule Take 1 capsule (40 mg total) by mouth daily. 09/13/14   Yevette Hem, FNP  fluticasone  (FLONASE ) 50 MCG/ACT nasal spray Place 1 spray into both nostrils daily. 09/30/21   [provider]  Fluticasone -Umeclidin-Vilant (TRELEGY ELLIPTA ) 200-62.5-25 MCG/ACT AEPB Inhale 1 puff into the lungs daily. 08/25/22   Wilfredo Hanly, MD  hydrochlorothiazide  (HYDRODIURIL ) 25 MG tablet Take 1 tablet (25 mg total) by mouth daily. 09/13/14    Tommas Fragmin A, FNP  ipratropium-albuterol  (DUONEB) 0.5-2.5 (3) MG/3ML SOLN SMARTSIG:1 Ampule(s) Via Nebulizer 3 Times Daily PRN Patient taking differently: Take 3 mLs by nebulization as needed (wheezing/SOB). 02/12/22   Wilfredo Hanly, MD  levofloxacin  (LEVAQUIN ) 750 MG tablet Take 1 tablet (750 mg total) by mouth daily. 05/04/24   Wilfredo Hanly, MD  montelukast  (SINGULAIR ) 10 MG tablet Take 1 tablet (10 mg total) by mouth at bedtime. 08/25/22   Wilfredo Hanly, MD  predniSONE  (DELTASONE ) 10 MG tablet Take 4 tablets (40 mg total) by mouth daily with breakfast for 7 days, THEN 3 tablets (30 mg total) daily with breakfast for 7 days, THEN 2 tablets (20 mg total) daily with breakfast for 7 days, THEN 1 tablet (10 mg total) daily with breakfast for 7 days. 05/04/24 06/01/24  Wilfredo Hanly, MD  simvastatin  (ZOCOR ) 40 MG tablet Take 1 tablet (40 mg total) by mouth at bedtime. 09/15/14   Tommas Fragmin A, FNP  sodium chloride  HYPERTONIC 3 % nebulizer solution Take 4 mLs by nebulization 3 (three) times daily. 09/24/23   Lorita Rosa, MD      Allergies    Codeine    Review of Systems   Review of Systems  Respiratory:  Positive for shortness of breath.     Physical Exam Updated Vital Signs BP (!) 111/58   Pulse 93   Temp 98.9 F (37.2 C) (Oral)   Resp 14   SpO2 94%  Physical Exam Vitals and nursing note reviewed.  Constitutional:      General: She is not in acute distress.    Appearance: She is not ill-appearing or toxic-appearing.  HENT:     Head: Normocephalic and atraumatic.     Mouth/Throat:     Mouth: Mucous membranes are moist.     Pharynx: No posterior oropharyngeal erythema.  Eyes:     General: No scleral icterus.       Right eye: No discharge.        Left eye: No discharge.     Conjunctiva/sclera: Conjunctivae normal.  Cardiovascular:     Rate and Rhythm: Normal rate and regular rhythm.     Pulses: Normal pulses.     Heart sounds: Normal heart sounds. No  murmur heard. Pulmonary:     Effort: Pulmonary effort is normal. No respiratory distress.     Breath sounds: Rhonchi present. No wheezing or rales.     Comments: Diffuse rhonchi Abdominal:     Tenderness: There is no abdominal tenderness.  Musculoskeletal:     Right lower leg: No edema.     Left lower leg: No edema.  Skin:    General: Skin is warm and dry.     Findings: No rash.  Neurological:     General: No focal deficit present.     Mental Status: She is alert and oriented to person, place, and time. Mental status is at baseline.  Psychiatric:        Mood and Affect: Mood normal.        Behavior: Behavior normal.     ED Results / Procedures / Treatments   Labs (all labs ordered are listed, but only abnormal results are displayed) Labs Reviewed  COMPREHENSIVE METABOLIC PANEL WITH GFR - Abnormal; Notable for the following components:      Result Value   Sodium 134 (*)    Chloride 96 (*)    Glucose, Bld 113 (*)    Total Protein 6.3 (*)    Albumin 3.3 (*)    AST 13 (*)    All other components within normal limits  CBC - Abnormal; Notable for the following components:   WBC 13.9 (*)    Hemoglobin 10.7 (*)    HCT 35.0 (*)    RDW 16.5 (*)    All other components within normal limits  I-STAT CHEM 8, ED - Abnormal; Notable for the following components:   Sodium 133 (*)    Chloride 96 (*)    Glucose, Bld 108 (*)    Calcium, Ion 1.14 (*)    All other components within normal limits  RESP PANEL BY RT-PCR (RSV, FLU A&B, COVID)  RVPGX2  EXPECTORATED SPUTUM ASSESSMENT W GRAM STAIN, RFLX TO RESP C  CULTURE, BLOOD (ROUTINE X 2)  CULTURE, BLOOD (ROUTINE X 2)  EXPECTORATED SPUTUM ASSESSMENT W GRAM STAIN, RFLX TO RESP C  BRAIN NATRIURETIC PEPTIDE  PROCALCITONIN  LEGIONELLA PNEUMOPHILA SEROGP 1 UR AG  STREP PNEUMONIAE URINARY ANTIGEN  MAGNESIUM   PHOSPHORUS    EKG None  Radiology CT Angio Chest PE W/Cm &/Or Wo Cm Result Date: 05/15/2024 CLINICAL DATA:  68 year old female  treated for pneumonia. Persistent symptoms, shortness of breath. EXAM: CT ANGIOGRAPHY CHEST WITH CONTRAST TECHNIQUE: Multidetector CT imaging of the chest was performed using the standard protocol during bolus administration of intravenous contrast. Multiplanar CT image reconstructions and MIPs were obtained to evaluate the vascular anatomy. RADIATION DOSE REDUCTION: This exam was performed according to the departmental dose-optimization program which includes automated exposure control, adjustment  of the mA and/or kV according to patient size and/or use of iterative reconstruction technique. CONTRAST:  75mL OMNIPAQUE  IOHEXOL  350 MG/ML SOLN COMPARISON:  Chest radiographs 05/04/2024 and earlier. CTA chest 03/14/2024. FINDINGS: Cardiovascular: Good contrast bolus timing in the pulmonary arterial tree. No pulmonary artery filling defect. Enhancing right lower lobe atelectasis or consolidation, see details below. Advanced Calcified aortic atherosclerosis. Calcified coronary artery atherosclerosis. Heart size remains normal. No pericardial effusion. Mediastinum/Nodes: Hilar and mediastinal lymph nodes are stable and within normal limits. Lungs/Pleura: Chronic emphysema. Stable lung volume since March. Central airways remain patent. However, unresolved confluent peribronchial confluent airspace disease in the left lingula and inferior left lower lobe since that time. The pattern today is slightly different. New although similar appearing right middle lobe medial segment atelectasis versus consolidation. And likewise new and widespread right lower lobe basilar segmental airspace disease with air bronchograms. These areas are partially enhancing, although the constellation is more suspicious for infectious consolidation than atelectasis. The major airways remain patent. No cavitation. No pleural effusion. Mosaic attenuation elsewhere appears to be a combination of gas trapping and atelectasis. Upper Abdomen: More distended  gallbladder now, but otherwise negative visible early enhanced liver, gallbladder, spleen, pancreas, adrenal glands, kidneys and bowel in the upper abdomen. No pneumoperitoneum or upper abdominal free fluid. Advanced Calcified aortic atherosclerosis. Musculoskeletal: Osteopenia. Bulky T7-T8 disc osteophyte complex projecting into the left spinal canal there is chronic and stable. No acute osseous abnormality identified. Review of the MIP images confirms the above findings. IMPRESSION: 1. Negative for acute pulmonary embolus. 2. Emphysema (ICD10-J43.9). Progressed since March multifocal Bilateral confluent airspace opacity in the lungs which is probably combination of Pneumonia and atelectasis. No associated pleural effusion. No cavitation. Reactive appearing mediastinal lymph nodes. 3. Aortic Atherosclerosis (ICD10-I70.0) and coronary artery atherosclerosis. Electronically Signed   By: Marlise Simpers M.D.   On: 05/15/2024 12:18    Procedures .Critical Care  Performed by: [place] Bureau, PA-C Authorized by: [place] Bureau, PA-C   Critical care provider statement:    Critical care time (minutes):  30   Critical care was necessary to treat or prevent imminent or life-threatening deterioration of the following conditions:  Respiratory failure   Critical care was time spent personally by me on the following activities:  Development of treatment plan with patient or surrogate, discussions with consultants, evaluation of patient's response to treatment, examination of patient, ordering and review of laboratory studies, ordering and review of radiographic studies, ordering and performing treatments and interventions, pulse oximetry, re-evaluation of patient's condition and review of old charts   Care discussed with: admitting provider   Comments:     PNA and hypoxia requiring hospital admission     Medications Ordered in ED Medications  azithromycin  (ZITHROMAX ) 500 mg in sodium chloride  0.9 % 250 mL  IVPB (has no administration in time range)  lactated ringers bolus 500 mL (has no administration in time range)  lactated ringers infusion (has no administration in time range)  azithromycin  (ZITHROMAX ) 500 mg in sodium chloride  0.9 % 250 mL IVPB (has no administration in time range)  ceFEPIme  (MAXIPIME ) 2 g in sodium chloride  0.9 % 100 mL IVPB (has no administration in time range)  acetaminophen  (TYLENOL ) tablet 650 mg (has no administration in time range)    Or  acetaminophen  (TYLENOL ) suppository 650 mg (has no administration in time range)  ondansetron  (ZOFRAN ) tablet 4 mg (has no administration in time range)    Or  ondansetron  (ZOFRAN ) injection 4 mg (has no  administration in time range)  ipratropium-albuterol  (DUONEB) 0.5-2.5 (3) MG/3ML nebulizer solution 3 mL (has no administration in time range)  albuterol  (PROVENTIL ) (2.5 MG/3ML) 0.083% nebulizer solution 2.5 mg (has no administration in time range)  ipratropium-albuterol  (DUONEB) 0.5-2.5 (3) MG/3ML nebulizer solution 3 mL (3 mLs Nebulization Given 05/15/24 1123)  iohexol  (OMNIPAQUE ) 350 MG/ML injection 75 mL (75 mLs Intravenous Contrast Given 05/15/24 1202)  cefTRIAXone  (ROCEPHIN ) 1 g in sodium chloride  0.9 % 100 mL IVPB (0 g Intravenous Stopped 05/15/24 1447)    ED Course/ Medical Decision Making/ A&P                                 Medical Decision Making Amount and/or Complexity of Data Reviewed Labs: ordered. Radiology: ordered.  Risk Prescription drug management.   This patient presents to the ED for concern of shortness of breath, this involves an extensive number of treatment options, and is a complaint that carries with it a high risk of complications and morbidity.  The differential diagnosis includes Anxiety, Anaphylaxis/Angioedema, Aspirated FB, Arrhythmia, CHF, Asthma, COPD, PNA, COVID/Flu/RSV, STEMI, Tamponade, TPNX, Sepsis   Co morbidities that complicate the patient evaluation  COPD, respiratory failure,  HTN, GERD, CHF   Additional history obtained:  03/14/2024 chest xray: Nonspecific opacities overlying the left mid lung zone and left retrocardiac region, which may represent atelectasis/scarring versus pneumonitis. 05/04/2024 chest xray: Atelectasis/scarring versus persistent infiltrate in the right middle lobe and lingula.   Problem List / ED Course / Critical interventions / Medication management  Patient presents to ED concern for cough and SOB x 2 months.  Patient concern for recurring/nonresolving pneumonia.  Patient just stopped Levaquin  ABX yesterday.  Patient endorsing having to use her home O2 more frequently when walking.  Physical exam with diffuse coarse rhonchi.  Patient also initially tachycardic at 115 bpm.  Patient desatted to 89% on 2L [place]. Rest of physical exam reassuring. I Ordered, and personally interpreted labs.  CMP with mild hyponatremia 134.  CBC with leukocytosis of 13.9.  There is also anemia with hemoglobin of 10.7.  Respiratory panel negative.  BNP within normal limits. The patient was maintained on a cardiac monitor.  I personally viewed and interpreted the cardiac monitored which showed an underlying rhythm of: Sinus tachycardia I ordered imaging studies including CTA chest to assess for process contributing to patient's symptoms. I independently visualized and interpreted imaging which showed progressing multifocal pneumonia. I agree with the radiologist interpretation. I have reviewed the patients home medicines and have made adjustments as needed As patient is still experiencing hypoxia and has pneumonia and not resolving with outpatient antibiotics, I believe she meets criteria for inpatient treatment at this time.  Patient agrees with plan. Consulted with Dr. Bonita Bussing who agrees with admit patient.  Social Determinants of Health:  none          Final Clinical Impression(s) / ED Diagnoses Final diagnoses:  Pneumonia of both lungs due to infectious  organism, unspecified part of lung    Rx / DC Orders ED Discharge Orders     None         [place] Bureau, [place] 05/15/24 1448    Flonnie Humphrey, DO 05/15/24 1600

## 2024-05-15 NOTE — H&P (Signed)
 History and Physical    Patient: Breanna Ramirez OZH:[id number] DOB: 1956-04-19 DOA: 05/15/2024 DOS: the patient was seen and examined on 05/15/2024 PCP: Narda Bacon, MD  Patient coming from: Home  Chief Complaint:  Chief Complaint  Patient presents with   Shortness of Breath   HPI: Breanna Ramirez is a 68 y.o. female with medical history significant of COPD, postinflammatory pulmonary fibrosis, history of tobacco use in remission, chronic diastolic CHF, chronic sinusitis, history of multifocal pneumonia, iron  deficiency anemia, hypertension, hyperlipidemia, GERD, chronic respiratory failure with hypoxia on home oxygen  who was discharged from the hospital due to pneumonia about 2 months ago and returns today to the emergency department stating that her pneumonia did not get better.  She was seen last week at the pulmonary clinic, received a prescription for prednisone  and levofloxacin , which she just finished yesterday.  She has now has significant relief.  She has been using oxygen  as needed during daytime and every night while sleeping.  She is normally at 2 L/min, but today has had to increase to 4 LPM.  She has been wheezing and coughing, but stated that her cough is nonproductive.  No pleuritic chest pain.  No recent sick contacts or travel history.He denied fever, chills, rhinorrhea, sore throat or hemoptysis.  No chest pain, palpitations, diaphoresis, PND, orthopnea or pitting edema of the lower extremities.  No abdominal pain, nausea, emesis, diarrhea, constipation, melena or hematochezia.  No flank pain, dysuria, frequency or hematuria.  No polyuria, polydipsia, polyphagia or blurred vision.   Lab work: CBC showed white count 13.9, hemoglobin 10.7 g/dL platelets 629.  Coronavirus, influenza and RSV PCR test was negative.  BNP 44.5 pg/mL.  CMP showed 734, potassium 3.5, chloride 96 and CO2 27 mmol/L.  Glucose 113 mg/dL, normal calcium level and renal function.  Total protein 6.3 and albumin 3.3  g/dL.  AST was 13 units/L, ALT, alkaline phosphatase and total bilirubin were normal.  Imaging: CTA chest was negative for acute pulmonary embolus.  There was emphysema.  There is progression since March showed multifocal bilateral confluent airspace opacity in the lungs which is probably a combination of pneumonia and atelectasis, although more suspicious for infectious consolidation than atelectasis.  No associated pleural effusion.  No cavitation.  Reactive appearing mediastinal lymph nodes.  There was advanced calcified aortic.  Coronary artery atherosclerosis.  Normal heart size.  No pericardial effusion.   ED course: Initial vital signs were temperature 98.7 F, pulse 115, respiration 22, BP 110/60 mmHg O2 sat 96% on room air.  The patient received ceftriaxone  1 g IVPB, azithromycin  500 mg IVPB, a DuoNeb and 500 mL of LR bolus.  Review of Systems: As mentioned in the history of present illness. All other systems reviewed and are negative. Past Medical History:  Diagnosis Date   COPD (chronic obstructive pulmonary disease) (HCC)    Essential hypertension    GERD (gastroesophageal reflux disease)    Pneumonia 05/2017   Respiratory failure with hypoxia (HCC) 05/2017   Past Surgical History:  Procedure Laterality Date   ENDOMETRIAL ABLATION     Social History:  reports that she quit smoking about 6 years ago. Her smoking use included cigarettes. She started smoking about 51 years ago. She has a 45 pack-year smoking history. She has never used smokeless tobacco. She reports that she does not drink alcohol and does not use drugs.  Allergies  Allergen Reactions   Codeine Nausea And Vomiting    Family History  Problem Relation Age  of Onset   Cancer Father     Prior to Admission medications   Medication Sig Start Date End Date Taking? Authorizing Provider  albuterol  (PROVENTIL  HFA;VENTOLIN  HFA) 108 (90 Base) MCG/ACT inhaler Inhale 2 puffs into the lungs every 6 (six) hours as needed for  wheezing or shortness of breath.    [provider]  amoxicillin -clavulanate (AUGMENTIN ) 875-125 MG tablet Take 1 tablet by mouth 2 (two) times daily. 04/26/24   Parrett, Macdonald Savoy, NP  Ensifentrine  (OHTUVAYRE ) 3 MG/2.5ML SUSP Inhale 1 Inhalation into the lungs 2 (two) times daily. 04/26/24   Parrett, Macdonald Savoy, NP  esomeprazole  (NEXIUM ) 40 MG capsule Take 1 capsule (40 mg total) by mouth daily. 09/13/14   Tommas Fragmin A, FNP  fluticasone  (FLONASE ) 50 MCG/ACT nasal spray Place 1 spray into both nostrils daily. 09/30/21   [provider]  Fluticasone -Umeclidin-Vilant (TRELEGY ELLIPTA ) 200-62.5-25 MCG/ACT AEPB Inhale 1 puff into the lungs daily. 08/25/22   Wilfredo Hanly, MD  hydrochlorothiazide  (HYDRODIURIL ) 25 MG tablet Take 1 tablet (25 mg total) by mouth daily. 09/13/14   Hawks, Kathaleen Pale A, FNP  ipratropium-albuterol  (DUONEB) 0.5-2.5 (3) MG/3ML SOLN SMARTSIG:1 Ampule(s) Via Nebulizer 3 Times Daily PRN Patient taking differently: Take 3 mLs by nebulization as needed (wheezing/SOB). 02/12/22   Wilfredo Hanly, MD  levofloxacin  (LEVAQUIN ) 750 MG tablet Take 1 tablet (750 mg total) by mouth daily. 05/04/24   Wilfredo Hanly, MD  montelukast  (SINGULAIR ) 10 MG tablet Take 1 tablet (10 mg total) by mouth at bedtime. 08/25/22   Wilfredo Hanly, MD  predniSONE  (DELTASONE ) 10 MG tablet Take 4 tablets (40 mg total) by mouth daily with breakfast for 7 days, THEN 3 tablets (30 mg total) daily with breakfast for 7 days, THEN 2 tablets (20 mg total) daily with breakfast for 7 days, THEN 1 tablet (10 mg total) daily with breakfast for 7 days. 05/04/24 06/01/24  Wilfredo Hanly, MD  simvastatin  (ZOCOR ) 40 MG tablet Take 1 tablet (40 mg total) by mouth at bedtime. 09/15/14   Tommas Fragmin A, FNP  sodium chloride  HYPERTONIC 3 % nebulizer solution Take 4 mLs by nebulization 3 (three) times daily. 09/24/23   Lorita Rosa, MD    Physical Exam: Vitals:   05/15/24 1315 05/15/24 1330 05/15/24 1345  05/15/24 1350  BP:    (!) 111/58  Pulse: (!) 103 (!) 103 (!) 109 93  Resp: 15 13 (!) 31 14  Temp:      TempSrc:      SpO2: 96% 96% 92% 94%   Physical Exam Vitals and nursing note reviewed.  Constitutional:      General: She is awake. She is not in acute distress.    Appearance: She is ill-appearing. She is not diaphoretic.  HENT:     Head: Normocephalic.     Nose: No rhinorrhea.  Eyes:     General: No scleral icterus.    Pupils: Pupils are equal, round, and reactive to light.  Neck:     Vascular: No JVD.  Cardiovascular:     Rate and Rhythm: Normal rate and regular rhythm.     Heart sounds: S1 normal and S2 normal.  Pulmonary:     Effort: Pulmonary effort is normal. No tachypnea or accessory muscle usage.     Breath sounds: Decreased breath sounds and wheezing present. No rhonchi or rales.  Abdominal:     General: Bowel sounds are normal. There is no distension.     Palpations: Abdomen is soft.  Tenderness: There is no abdominal tenderness. There is no right CVA tenderness or left CVA tenderness.  Musculoskeletal:     Cervical back: Neck supple.     Right lower leg: No edema.     Left lower leg: No edema.  Skin:    General: Skin is warm and dry.  Neurological:     General: No focal deficit present.     Mental Status: She is alert and oriented to person, place, and time.  Psychiatric:        Mood and Affect: Mood normal.        Behavior: Behavior normal. Behavior is cooperative.     Data Reviewed:  Results are pending, will review when available. 09/22/2023 echocardiogram report. IMPRESSIONS:   1. Left ventricular ejection fraction, by estimation, is 70 to 75%. The  left ventricle has hyperdynamic function. The left ventricle has no  regional wall motion abnormalities. There is mild left ventricular  hypertrophy of the basal-septal segment. Left  ventricular diastolic parameters are consistent with Grade I diastolic  dysfunction (impaired relaxation).   2.  Right ventricular systolic function is hyperdynamic. The right  ventricular size is normal. Tricuspid regurgitation signal is inadequate  for assessing PA pressure.   3. The mitral valve is degenerative. Trivial mitral valve regurgitation.  Mild mitral stenosis.   4. The aortic valve is tricuspid. There is mild calcification of the  aortic valve. Aortic valve regurgitation is not visualized. Aortic valve  sclerosis is present, with no evidence of aortic valve stenosis.   5. The inferior vena cava is dilated in size with <50% respiratory  variability, suggesting right atrial pressure of 15 mmHg.   Comparison(s): No prior Echocardiogram.   EKG: Vent. rate 108 BPM PR interval 122 ms QRS duration 81 ms QT/QTcB 322/432 ms P-R-T axes 83 79 89 Sinus tachycardia Ventricular premature complex LAE, consider biatrial enlargement Nonspecific T abnormalities, lateral leads  Assessment and Plan: Principal Problem:   Acute on chronic respiratory failure with hypoxia (HCC) In the setting of:   Multifocal pneumonia/infiltrates. Superimposed on:   COPD (chronic obstructive pulmonary disease) (HCC) And:   Postinflammatory pulmonary fibrosis (HCC)  Admit to telemetry/inpatient. Continue supplemental oxygen . Scheduled and as needed bronchodilators. Begin cefepime  2 g every 8 hours.  Continue azithromycin  500 mg IVPB daily. Solu-Medrol  40 mg IVP twice daily  Check strep pneumoniae urinary antigen. Check Legionella urinary antigen. Check sputum Gram stain, culture and sensitivity. Follow-up blood culture and sensitivity. Follow-up CBC and chemistry in the morning. Pulmonary consult appreciated. - Will follow the recommendations.  Active Problems:   G E R D Continue pantoprazole  40 mg at bedtime.    Essential hypertension, benign Blood pressures are soft. Hold hydrochlorothiazide  25 mg p.o. daily.    IDA (iron  deficiency anemia) Monitor hematocrit and hemoglobin. Transfuse as  needed.    Hyperlipidemia Continue simvastatin  40 mg p.o. bedtime.    Chronic diastolic CHF (congestive heart failure) (HCC) No signs of volume overload.    Tobacco use In remission.      Advance Care Planning:   Code Status: Full Code   Consults: PCCM Orbie Binder, MD)  Family Communication:   Severity of Illness: The appropriate patient status for this patient is INPATIENT. Inpatient status is judged to be reasonable and necessary in order to provide the required intensity of service to ensure the patient's safety. The patient's presenting symptoms, physical exam findings, and initial radiographic and laboratory data in the context of their chronic comorbidities is felt to  place them at high risk for further clinical deterioration. Furthermore, it is not anticipated that the patient will be medically stable for discharge from the hospital within 2 midnights of admission.   * I certify that at the point of admission it is my clinical judgment that the patient will require inpatient hospital care spanning beyond 2 midnights from the point of admission due to high intensity of service, high risk for further deterioration and high frequency of surveillance required.*  Author: Danice Dural, MD 05/15/2024 2:14 PM  For on call review [email or web address].   This document was prepared using Dragon voice recognition software and may contain some unintended transcription errors.

## 2024-05-15 NOTE — Consult Note (Signed)
 NAME:  Breanna Ramirez, MRN:  [id number], DOB:  07-06-1956, LOS: 0 ADMISSION DATE:  05/15/2024, CONSULTATION DATE:  05/15/24 REFERRING MD:  TRH, CHIEF COMPLAINT:  wheeze, DOE   History of Present Illness:  68 year old woman admitted with worsening dyspnea and hypoxemia treated for commune acquired pneumonia who were consulted for the same.  Presented with worsening hypoxemia increasing her oxygen  at home it sounds like.  Really it sound like she should be on 3 L.  She was discharged 3 L to 04/2023.  She states her baseline is 2.5 L.  But been on 3 L since earlier this year when admitted.  Unclear if there really is a change.  She still endorses worsening dyspnea.  And chronic cough.  She says worsened hospitalization 02/2024.  Reviewed serial images as really bandlike infiltrates waxing and waning most likely scattered areas of atelectasis on my review interpretation.  Pertinent  Medical History  Emphysema without fixed obstruction on PFT 2023, presumed asthma, chronic hypoxemia on 3 L baseline  Significant Hospital Events: Including procedures, antibiotic start and stop dates in addition to other pertinent events   5/18 admitted to the hospital with worsening hypoxemia quickly titrated to home on oxygen , with persistent wheeze and shortness of breath  Interim History / Subjective:    Objective    Blood pressure (!) 101/56, pulse 97, temperature 98 F (36.7 C), temperature source Oral, resp. rate 18, height 5\' 4"  (1.626 m), weight 57.2 kg, SpO2 98%.        Intake/Output Summary (Last 24 hours) at 05/15/2024 1835 Last data filed at 05/15/2024 1750 Gross per 24 hour  Intake 540.84 ml  Output --  Net 540.84 ml   Filed Weights   05/15/24 1512  Weight: 57.2 kg    Examination: General: Sitting up in bed, eating dinner HENT: Atraumatic normocephalic Lungs: Inspiratory squeaks, faint initially wheezes throughout most prominent in the bases bilaterally Cardiovascular: Regular rate rate  and rhythm Abdomen: Nondistended Neuro: No deficits  Resolved problem list   Assessment and Plan   Acute on chronic hypoxemic respiratory failure due to presumed exacerbation of underlying asthma and emphysema: PFTs 04/2022 without fixed obstruction, air trapping present.  Persistent shortness of breath wheezing dry cough since hospitalization for "pneumonia" 02/2024.  Chronically on 2.5 L O2 but increased to 3 L after that admission.  She is on her baseline oxygen .  She is wheezy.  Given prolonged duration of symptoms I do wonder about the development or activation of reactive airway disease or asthma.  Her CT scans show serial changes in linear bands or atelectasis 11/2023 on CT lung cancer screening 02/2024 with "pneumonia" and on admission this hospitalization.  The waxing and waning infiltrates could represent organizing pneumonia.  However inflammatory markers earlier this month were negative.  And imaging characteristics do not quite fit.  Most likely this represents progression of underlying lung disease combined with deconditioning in the setting of dyspnea.  I am not so sure this is really an acute change but rather slow decline dating back to multiple hospitalizations in 2024. -- Continue antibiotics -- Check procalcitonin, if negative consider de-escalation or discontinuation of antibiotics -- Check full respiratory viral panel -- Solu-Medrol  40 mg IV twice daily for now -- LAMA LABA ICS nebulized scheduled, short acting bronchodilators nebulized as needed -- Repeat sed rate, CRP  Best Practice (right click and "Reselect all SmartList Selections" daily)   Per primary  Labs   CBC: Recent Labs  Lab 05/15/24 1134 05/15/24  1143  WBC 13.9*  --   HGB 10.7* 12.2  HCT 35.0* 36.0  MCV 86.8  --   PLT 356  --     Basic Metabolic Panel: Recent Labs  Lab 05/15/24 1134 05/15/24 1143 05/15/24 1621  NA 134* 133*  --   K 3.5 3.6  --   CL 96* 96*  --   CO2 27  --   --   GLUCOSE 113*  108*  --   BUN 13 11  --   CREATININE 0.85 0.90  --   CALCIUM 9.5  --   --   MG  --   --  2.0  PHOS  --   --  3.2   GFR: Estimated Creatinine Clearance: 51.7 mL/min (by C-G formula based on SCr of 0.9 mg/dL). Recent Labs  Lab 05/15/24 1134  WBC 13.9*    Liver Function Tests: Recent Labs  Lab 05/15/24 1134  AST 13*  ALT 10  ALKPHOS 55  BILITOT 0.5  PROT 6.3*  ALBUMIN 3.3*   No results for input(s): "LIPASE", "AMYLASE" in the last 168 hours. No results for input(s): "AMMONIA" in the last 168 hours.  ABG    Component Value Date/Time   HCO3 27.9 10/31/2021 1131   TCO2 28 05/15/2024 1143   O2SAT 30.0 10/31/2021 1131     Coagulation Profile: No results for input(s): "INR", "PROTIME" in the last 168 hours.  Cardiac Enzymes: No results for input(s): "CKTOTAL", "CKMB", "CKMBINDEX", "TROPONINI" in the last 168 hours.  HbA1C: No results found for: "HGBA1C"  CBG: No results for input(s): "GLUCAP" in the last 168 hours.  Review of Systems:   No chest pain with orthopnea or PND.  Comprehensive review of systems otherwise negative.  Past Medical History:  She,  has a past medical history of COPD (chronic obstructive pulmonary disease) (HCC), Essential hypertension, GERD (gastroesophageal reflux disease), Pneumonia (05/2017), and Respiratory failure with hypoxia (HCC) (05/2017).   Surgical History:   Past Surgical History:  Procedure Laterality Date   ENDOMETRIAL ABLATION       Social History:   reports that she quit smoking about 6 years ago. Her smoking use included cigarettes. She started smoking about 51 years ago. She has a 45 pack-year smoking history. She has never used smokeless tobacco. She reports that she does not drink alcohol and does not use drugs.   Family History:  Her family history includes Cancer in her father.   Allergies Allergies  Allergen Reactions   Codeine Nausea And Vomiting     Home Medications  Prior to Admission medications    Medication Sig Start Date End Date Taking? Authorizing Provider  albuterol  (PROVENTIL  HFA;VENTOLIN  HFA) 108 (90 Base) MCG/ACT inhaler Inhale 2 puffs into the lungs every 6 (six) hours as needed for wheezing or shortness of breath.   Yes [provider]  Aspirin -Salicylamide-Caffeine (BC HEADACHE POWDER PO) Take 1 packet by mouth as needed.   Yes [provider]  cholecalciferol (VITAMIN D3) 25 MCG (1000 UNIT) tablet Take 1,000 Units by mouth every other day.   Yes [provider]  Ensifentrine  (OHTUVAYRE ) 3 MG/2.5ML SUSP Inhale 1 Inhalation into the lungs 2 (two) times daily. 04/26/24  Yes Parrett, [name] S, NP  esomeprazole  (NEXIUM ) 40 MG capsule Take 1 capsule (40 mg total) by mouth daily. Patient taking differently: Take 40 mg by mouth at bedtime. 09/13/14  Yes Hawks, Christy A, FNP  fluticasone  (FLONASE ) 50 MCG/ACT nasal spray Place 1 spray into both nostrils  every evening. 09/30/21  Yes [provider]  Fluticasone -Umeclidin-Vilant (TRELEGY ELLIPTA ) 200-62.5-25 MCG/ACT AEPB Inhale 1 puff into the lungs daily. Patient taking differently: Inhale 1 puff into the lungs in the morning. 08/25/22  Yes Wilfredo Hanly, MD  hydrochlorothiazide  (HYDRODIURIL ) 25 MG tablet Take 1 tablet (25 mg total) by mouth daily. Patient taking differently: Take 25 mg by mouth in the morning. 09/13/14  Yes Hawks, Christy A, FNP  ibuprofen (ADVIL) 200 MG tablet Take 400 mg by mouth as needed for mild pain (pain score 1-3) or moderate pain (pain score 4-6).   Yes [provider]  ipratropium-albuterol  (DUONEB) 0.5-2.5 (3) MG/3ML SOLN SMARTSIG:1 Ampule(s) Via Nebulizer 3 Times Daily PRN Patient taking differently: Take 3 mLs by nebulization as needed (wheezing/SOB). 02/12/22  Yes Wilfredo Hanly, MD  montelukast  (SINGULAIR ) 10 MG tablet Take 1 tablet (10 mg total) by mouth at bedtime. 08/25/22  Yes Wilfredo Hanly, MD  predniSONE  (DELTASONE ) 10 MG tablet Take 4 tablets (40 mg  total) by mouth daily with breakfast for 7 days, THEN 3 tablets (30 mg total) daily with breakfast for 7 days, THEN 2 tablets (20 mg total) daily with breakfast for 7 days, THEN 1 tablet (10 mg total) daily with breakfast for 7 days. 05/04/24 06/01/24 Yes Wilfredo Hanly, MD  simvastatin  (ZOCOR ) 40 MG tablet Take 1 tablet (40 mg total) by mouth at bedtime. 09/15/14  Yes Yevette Hem, FNP     Critical care time: n/a    Guerry Leek, MD See Tilford Foley

## 2024-05-16 ENCOUNTER — Encounter (HOSPITAL_COMMUNITY): Payer: Self-pay | Admitting: *Deleted

## 2024-05-16 ENCOUNTER — Inpatient Hospital Stay (HOSPITAL_COMMUNITY): Admitting: Anesthesiology

## 2024-05-16 ENCOUNTER — Encounter (HOSPITAL_COMMUNITY)
Admission: EM | Disposition: A | Discharge status: Home / Self Care | Payer: Self-pay | Source: Home / Self Care | Attending: Internal Medicine

## 2024-05-16 DIAGNOSIS — J9621 Acute and chronic respiratory failure with hypoxia: Secondary | ICD-10-CM | POA: Diagnosis not present

## 2024-05-16 HISTORY — PX: FLEXIBLE BRONCHOSCOPY: SHX5094

## 2024-05-16 HISTORY — PX: BRONCHIAL WASHINGS: SHX5105

## 2024-05-16 LAB — DIFFERENTIAL
Abs Immature Granulocytes: 0.07 10*3/uL (ref 0.00–0.07)
Basophils Absolute: 0 10*3/uL (ref 0.0–0.1)
Basophils Relative: 0 %
Eosinophils Absolute: 0 10*3/uL (ref 0.0–0.5)
Eosinophils Relative: 0 %
Immature Granulocytes: 1 %
Lymphocytes Relative: 4 %
Lymphs Abs: 0.4 10*3/uL — ABNORMAL LOW (ref 0.7–4.0)
Monocytes Absolute: 0.3 10*3/uL (ref 0.1–1.0)
Monocytes Relative: 4 %
Neutro Abs: 8.9 10*3/uL — ABNORMAL HIGH (ref 1.7–7.7)
Neutrophils Relative %: 91 %

## 2024-05-16 LAB — CBC WITH DIFFERENTIAL/PLATELET

## 2024-05-16 LAB — BODY FLUID CELL COUNT WITH DIFFERENTIAL
Eos, Fluid: 0 %
Eos, Fluid: 0 %
Lymphs, Fluid: 54 %
Lymphs, Fluid: 67 %
Monocyte-Macrophage-Serous Fluid: 19 % — ABNORMAL LOW (ref 50–90)
Monocyte-Macrophage-Serous Fluid: 3 % — ABNORMAL LOW (ref 50–90)
Neutrophil Count, Fluid: 14 % (ref 0–25)
Neutrophil Count, Fluid: 43 % — ABNORMAL HIGH (ref 0–25)
Total Nucleated Cell Count, Fluid: 29 uL (ref 0–1000)
Total Nucleated Cell Count, Fluid: 34 uL (ref 0–1000)

## 2024-05-16 LAB — COMPREHENSIVE METABOLIC PANEL WITH GFR
ALT: 11 U/L (ref 0–44)
AST: 13 U/L — ABNORMAL LOW (ref 15–41)
Albumin: 2.8 g/dL — ABNORMAL LOW (ref 3.5–5.0)
Alkaline Phosphatase: 43 U/L (ref 38–126)
Anion gap: 8 (ref 5–15)
BUN: 16 mg/dL (ref 8–23)
CO2: 27 mmol/L (ref 22–32)
Calcium: 9.4 mg/dL (ref 8.9–10.3)
Chloride: 98 mmol/L (ref 98–111)
Creatinine, Ser: 0.78 mg/dL (ref 0.44–1.00)
GFR, Estimated: >60 mL/min (ref >60)
Glucose, Bld: 134 mg/dL — ABNORMAL HIGH (ref 70–99)
Potassium: 4.7 mmol/L (ref 3.5–5.1)
Sodium: 133 mmol/L — ABNORMAL LOW (ref 135–145)
Total Bilirubin: 0.4 mg/dL (ref 0.0–1.2)
Total Protein: 5.2 g/dL — ABNORMAL LOW (ref 6.5–8.1)

## 2024-05-16 LAB — CBC
HCT: 32 % — ABNORMAL LOW (ref 36.0–46.0)
Hemoglobin: 9.3 g/dL — ABNORMAL LOW (ref 12.0–15.0)
MCH: 26.6 pg (ref 26.0–34.0)
MCHC: 29.1 g/dL — ABNORMAL LOW (ref 30.0–36.0)
MCV: 91.7 fL (ref 80.0–100.0)
Platelets: 304 10*3/uL (ref 150–400)
RBC: 3.49 MIL/uL — ABNORMAL LOW (ref 3.87–5.11)
RDW: 16.9 % — ABNORMAL HIGH (ref 11.5–15.5)
WBC: 10 10*3/uL (ref 4.0–10.5)
nRBC: 0 % (ref 0.0–0.2)

## 2024-05-16 LAB — PROCALCITONIN: Procalcitonin: <0.1 ng/mL

## 2024-05-16 SURGERY — BRONCHOSCOPY, FLEXIBLE
Anesthesia: General | Laterality: Bilateral

## 2024-05-16 MED ORDER — CHLORHEXIDINE GLUCONATE 0.12 % MT SOLN
15.0000 mL | Freq: Once | OROMUCOSAL | Status: AC
  Administered 2024-05-16: 15 mL via OROMUCOSAL

## 2024-05-16 MED ORDER — LIDOCAINE HCL (CARDIAC) PF 100 MG/5ML IV SOSY
PREFILLED_SYRINGE | INTRAVENOUS | Status: DC | PRN
Start: 2024-05-16 — End: 2024-05-16
  Administered 2024-05-16: 50 mg via INTRAVENOUS

## 2024-05-16 MED ORDER — ONDANSETRON HCL 4 MG/2ML IJ SOLN: 4.0000 mg | Freq: Four times a day (QID) | INTRAMUSCULAR | Status: DC | PRN

## 2024-05-16 MED ORDER — CHLORHEXIDINE GLUCONATE 0.12 % MT SOLN
OROMUCOSAL | Status: AC
  Filled 2024-05-16: qty 15

## 2024-05-16 MED ORDER — PROPOFOL 10 MG/ML IV BOLUS
INTRAVENOUS | Status: DC | PRN
  Administered 2024-05-16: 100 mg via INTRAVENOUS
  Administered 2024-05-16: 150 ug/kg/min via INTRAVENOUS

## 2024-05-16 MED ORDER — OXYCODONE HCL 5 MG PO TABS: 5.0000 mg | ORAL_TABLET | Freq: Once | ORAL | Status: DC | PRN

## 2024-05-16 MED ORDER — PROPOFOL 1000 MG/100ML IV EMUL
INTRAVENOUS | Status: AC
  Filled 2024-05-16: qty 100

## 2024-05-16 MED ORDER — PANTOPRAZOLE SODIUM 40 MG PO TBEC
40.0000 mg | DELAYED_RELEASE_TABLET | Freq: Two times a day (BID) | ORAL | Status: DC
  Administered 2024-05-16 – 2024-05-18 (×4): 40 mg via ORAL
  Filled 2024-05-16 (×4): qty 1

## 2024-05-16 MED ORDER — FENTANYL CITRATE (PF) 100 MCG/2ML IJ SOLN: 25.0000 ug | INTRAMUSCULAR | Status: DC | PRN

## 2024-05-16 MED ORDER — OXYCODONE HCL 5 MG/5ML PO SOLN
5.0000 mg | Freq: Once | ORAL | Status: DC | PRN
  Filled 2024-05-16: qty 5

## 2024-05-16 NOTE — Progress Notes (Signed)
 PROGRESS NOTE  Breanna Ramirez  DOB: Sep 27, 1956  PCP: Narda Bacon, MD ION:[id number]  DOA: 05/15/2024  LOS: 1 day  Hospital Day: 2  Brief narrative: Breanna Ramirez is a 68 y.o. female with PMH significant for HTN, HLD, CHF, GERD, COPD, past smoking, pulmonary fibrosis, chronic anemia on home oxygen , was recently hospitalized about 2 months ago for pneumonia and was discharged 5/18, patient presented to ED stating that her breathing has not really improved since then.  Also reported wheezing, coughing.  She has been requiring higher flow oxygen  at home from 2L to 4Lpm.  In the ED, patient was afebrile,, heart rate 80s, blood pressure in 90s and low 100s, breathing on 3 L oxygen  by nasal cannula. Labs with WC count 13.9, hemoglobin 10.7, sodium 134, procalcitonin less than 0.1 Respiratory virus panel unremarkable CT angio chest was negative for PE.  Showed progression of emphysema, also showed multifocal bilateral confluent airspace diseases, pneumonia versus atelectasis  Patient was started on IV Rocephin , IV azithromycin , IV fluid, bronchodilators Admitted to [facility]  Subjective: Patient was seen and examined this morning. Pleasant elderly Caucasian female.  Propped up in bed.  Not in distress.  On 4 L oxygen  at rest. Able to have a meaningful conversation. No family at bedside. 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 As recommended, antibiotics stopped.  Steroids to continue. Noted a plan of bronch to rule out MAC and silent aspiration. Recent Labs  Lab 05/15/24 1134 05/15/24 1621 05/15/24 1830 05/16/24 0459 05/16/24 0706  WBC 13.9*  --   --  10.0 DUPL @0712  KAM  PROCALCITON  --  <0.10 <0.10 <0.10  --    Chronic diastolic CHF  Hypertension Blood pressure low normal range.  No evidence of volume overload.   HCTZ on hold Continue monitor  Chronic iron  deficiency anemia GERD Continue PPI Obtain anemia panel Recent Labs    09/21/23 1810 09/22/23 0526 03/16/24 0614 05/15/24 1134 05/15/24 1143 05/16/24 0459 05/16/24 0706  HGB 9.1*   < > 10.2* 10.7* 12.2 9.3* DUPL @0712  KAM  MCV 78.0*   < > 89.6 86.8  --  91.7 DUPL @0712  KAM  VITAMINB12 386  --   --   --   --   --   --   FOLATE 24.1  --   --   --   --   --   --   FERRITIN 12  --   --   --   --   --   --   TIBC 531*  --   --   --   --   --   --   IRON  24*  --   --   --   --   --   --   RETICCTPCT 4.4*  --   --   --   --   --   --    < > = values in this interval not displayed.  HLD Continue statin    Mobility: Independently able to ambulate at baseline.  Goals of care   Code Status: Full Code     DVT prophylaxis:  SCDs Start: 05/15/24 1447   Antimicrobials: DC antibiotics Fluid: None Consultants: Pulmonology Family Communication: None at bedside  Status: Inpatient Level of care:  Telemetry   Patient is from: Home Needs to continue in-hospital care: May need bronc Anticipated d/c to: Hopefully home in 2 to 3 days      Diet:  Diet Order             Diet NPO time specified  Diet effective now                   Scheduled Meds:  arformoterol   15 mcg Nebulization BID   budesonide  (PULMICORT ) nebulizer solution  0.5 mg Nebulization BID   methylPREDNISolone  (SOLU-MEDROL ) injection  40 mg Intravenous Q12H   montelukast   10 mg Oral QHS   pantoprazole   40  mg Oral BID   revefenacin   175 mcg Nebulization Daily   simvastatin   40 mg Oral QHS    PRN meds: acetaminophen  **OR** acetaminophen , guaiFENesin -dextromethorphan , ipratropium-albuterol , ondansetron  **OR** ondansetron  (ZOFRAN ) IV   Infusions:   lactated ringers      lactated ringers  75 mL/hr at 05/16/24 0824    Antimicrobials: Anti-infectives (From admission, onward)    Start     Dose/Rate Route Frequency Ordered Stop   05/16/24 1500  azithromycin  (ZITHROMAX ) 500 mg in sodium chloride  0.9 % 250 mL IVPB  Status:  Discontinued        500 mg 250 mL/hr over 60 Minutes Intravenous Every 24 hours 05/15/24 1439 05/16/24 0721   05/15/24 1445  ceFEPIme  (MAXIPIME ) 2 g in sodium chloride  0.9 % 100 mL IVPB  Status:  Discontinued        2 g 200 mL/hr over 30 Minutes Intravenous Every 8 hours 05/15/24 1439 05/16/24 0721   05/15/24 1345  cefTRIAXone  (ROCEPHIN ) 1 g in sodium chloride  0.9 % 100 mL IVPB        1 g 200 mL/hr over 30 Minutes Intravenous  Once 05/15/24 1337 05/15/24 1447   05/15/24 1345  azithromycin  (ZITHROMAX ) 500 mg in sodium chloride  0.9 % 250 mL IVPB  Status:  Discontinued        500 mg 250 mL/hr over 60 Minutes Intravenous  Once 05/15/24 1337 05/16/24 0721       Objective: Vitals:   05/16/24 0318 05/16/24 0836  BP: 97/83   Pulse: 81   Resp: 18   Temp: 98 F (36.7 C)   SpO2: 99% 98%    Intake/Output Summary (Last 24 hours) at 05/16/2024 1105 Last data filed at 05/15/2024 2142 Gross per 24 hour  Intake 660.84 ml  Output --  Net 660.84 ml   Filed Weights   05/15/24 1512  Weight: 57.2 kg   Weight change:  Body mass index is 21.65 kg/m.   Physical Exam: General exam: Pleasant, elderly Caucasian female.  Not in distress at rest Skin: No rashes, lesions or ulcers. HEENT: Atraumatic, normocephalic, no obvious bleeding Lungs: Clear to auscultation bilaterally, no wheezing CVS: S1, S2, has murmur with CCS 8 since birth.   GI/Abd: Soft, nontender, nondistended,  bowel sound present,   CNS: Alert, awake, alert x 3 Psychiatry: Mood appropriate,  Extremities: No pedal edema, no calf tenderness,   Data Review: I have personally reviewed the laboratory data and studies available.  F/u labs ordered Unresulted Labs (From admission, onward)  Start     Ordered   05/17/24 0500  Vitamin B12  (Anemia Panel (PNL))  Tomorrow morning,   R        05/16/24 1103   05/17/24 0500  Folate  (Anemia Panel (PNL))  Tomorrow morning,   R        05/16/24 1103   05/17/24 0500  Iron  and TIBC  (Anemia Panel (PNL))  Tomorrow morning,   R        05/16/24 1103   05/17/24 0500  Ferritin  (Anemia Panel (PNL))  Tomorrow morning,   R        05/16/24 1103   05/17/24 0500  Reticulocytes  (Anemia Panel (PNL))  Tomorrow morning,   R        05/16/24 1103   05/17/24 0500  Basic metabolic panel with GFR  Tomorrow morning,   R        05/16/24 1104   05/17/24 0500  CBC with Differential/Platelet  Tomorrow morning,   R        05/16/24 1104   05/15/24 1439  Expectorated Sputum Assessment w Gram Stain, Rflx to Resp Cult  (Severe pneumonia (requires ICU care) in adults without resistant organism risk factors )  ONCE - URGENT,   URGENT        05/15/24 1439   05/15/24 1438  Legionella Pneumophila Serogp 1 Ur Ag  Once,   R        05/15/24 1439   05/15/24 1438  Expectorated Sputum Assessment w Gram Stain, Rflx to Resp Cult  Once,   R        05/15/24 1439            Signed, Hoyt Macleod, MD [facility] 05/16/2024

## 2024-05-16 NOTE — Progress Notes (Signed)
 05/16/2024   Seen for abnormal CT  No changes to cough overnight. Still DOE  On exam +murmur, wet sounding cough, nontoxic appearance, nonlabored breathing, lungs diminished on auscultation, no edema  Labs/imaging reviewed.  Chronic cough, unintentional weight loss, hx of smoking  Would like to r/o MAC and silent aspiration  NPO, bronch hopefully this am and MBSS at some point  Nebs, steroids fine as ordered  DC abx   Ardelle Kos MD [facility] Prefer epic messenger for cross cover needs

## 2024-05-16 NOTE — Plan of Care (Signed)

## 2024-05-16 NOTE — Transfer of Care (Signed)
 Immediate Anesthesia Transfer of Care Note  Patient: Breanna Ramirez  Procedure(s) Performed: BRONCHOSCOPY, FLEXIBLE (Bilateral) IRRIGATION, BRONCHUS  Patient Location: PACU  Anesthesia Type:General  Level of Consciousness: awake, alert , and oriented  Airway & Oxygen  Therapy: Patient Spontanous Breathing and Patient connected to nasal cannula oxygen   Post-op Assessment: Report given to RN and Post -op Vital signs reviewed and stable  Post vital signs: Reviewed and stable  Last Vitals:  Vitals Value Taken Time  BP 118/77 05/16/2024  Temp    Pulse 77 05/16/2024  Resp 20 05/16/2024  SpO2 91 05/16/2024    Last Pain:  Vitals:   05/16/24 1154  TempSrc: Temporal  PainSc: 0-No pain      Patients Stated Pain Goal: 0 (05/15/24 1028)  Complications: No notable events documented.

## 2024-05-16 NOTE — Anesthesia Preprocedure Evaluation (Signed)
 Anesthesia Evaluation  Patient identified by MRN, date of birth, ID band Patient awake    Reviewed: Allergy & Precautions, H&P , NPO status , Patient's Chart, lab work & pertinent test results  Airway Mallampati: II   Neck ROM: full    Dental   Pulmonary COPD, former smoker   breath sounds clear to auscultation       Cardiovascular hypertension, +CHF   Rhythm:regular Rate:Normal     Neuro/Psych    GI/Hepatic ,GERD  ,,  Endo/Other    Renal/GU      Musculoskeletal   Abdominal   Peds  Hematology   Anesthesia Other Findings   Reproductive/Obstetrics                             Anesthesia Physical Anesthesia Plan  ASA: 3  Anesthesia Plan: General   Post-op Pain Management:    Induction: Intravenous  PONV Risk Score and Plan: 3 and Ondansetron , Dexamethasone , Midazolam and Treatment may vary due to age or medical condition  Airway Management Planned: Oral ETT  Additional Equipment:   Intra-op Plan:   Post-operative Plan: Extubation in OR  Informed Consent: I have reviewed the patients History and Physical, chart, labs and discussed the procedure including the risks, benefits and alternatives for the proposed anesthesia with the patient or authorized representative who has indicated his/her understanding and acceptance.     Dental advisory given  Plan Discussed with: CRNA, Anesthesiologist and Surgeon  Anesthesia Plan Comments:        Anesthesia Quick Evaluation

## 2024-05-16 NOTE — Progress Notes (Signed)
 SLP Cancellation Note  Patient Details Name: Breanna Ramirez MRN: [id number] DOB: [birth year]   Cancelled treatment:       Reason Eval/Treat Not Completed: Other (comment) (Patient NPO for bronch early PM. Pulmonary critical care MD advised to plan for MBS next date.)   Jacqualine Mater, MA, CCC-SLP Speech Therapy

## 2024-05-16 NOTE — Op Note (Signed)
 Bronchoscopy Procedure Note  Nataly Pacifico  [id number]  April 26, 1956  Date:05/16/24  Time:2:01 PM   Provider Performing:Sherrey North C Felipe Horton   Procedure(s):  Flexible bronchoscopy with bronchial alveolar lavage [phone]) and Initial Therapeutic Aspiration of Tracheobronchial Tree [phone])  Indication(s) Abnormal CT, chronic cough  Consent Risks of the procedure as well as the alternatives and risks of each were explained to the patient and/or caregiver.  Consent for the procedure was obtained and is signed in the bedside chart  Anesthesia General   Time Out Verified patient identification, verified procedure, site/side was marked, verified correct patient position, special equipment/implants available, medications/allergies/relevant history reviewed, required imaging and test results available.   Sterile Technique Usual hand hygiene, masks, gowns, and gloves were used   Procedure Description Bronchoscope advanced through endotracheal tube and into airway.  Airways were examined down to subsegmental level with findings noted below.   Following diagnostic evaluation, BAL(s) performed in RML, lingula with normal saline and return of plug-filled fluid  Findings:  - Bronchial pitting and friability of airways c/w chronic bronchitis - Thick tenacious inspissated mucoid occlusion of RML, lingula and to lesser extent LLL, RLL; eventually suctioned down to segmental level but would not be surprised if has many more plugs from this area - No endobronchial lesions   Complications/Tolerance None; patient tolerated the procedure well. Chest X-ray is not needed post procedure.   EBL Minimal   Specimen(s) RML BAL: AFB/fungal/regular culture, cyto Lingula BAL: AFB/fungal/regular culture, cyto

## 2024-05-16 NOTE — Progress Notes (Signed)
   05/16/24 1032  TOC Brief Assessment  Insurance and Status Reviewed  Patient has primary care physician Yes  Home environment has been reviewed apartment  Prior level of function: independent  Prior/Current Home Services No current home services  Social Drivers of Health Review SDOH reviewed no interventions necessary  Readmission risk has been reviewed Yes  Transition of care needs transition of care needs identified, TOC will continue to follow    CSW met with pt at bedside to discuss O2 and discharge planning. Pt reports she has been using O2, 3L at night, but has recently been needing to use it during the day. Pt receives O2 through [facility]. Pt has travel oxygen  tank in room, to use upon discharge. No further TOC needs at this time.  [place], MSW, LCSW 05/16/2024 10:34 AM

## 2024-05-16 NOTE — Anesthesia Procedure Notes (Signed)
 Procedure Name: Intubation Date/Time: 05/16/2024 1:42 PM  Performed by: [place] Cid, CRNAPatient Re-evaluated:Patient Re-evaluated prior to induction Oxygen  Delivery Method: Circle system utilized Preoxygenation: Pre-oxygenation with 100% oxygen  Induction Type: IV induction Ventilation: Mask ventilation without difficulty Laryngoscope Size: Mac and 3 Grade View: Grade I Tube type: Oral Tube size: 8.0 mm Airway Equipment and Method: Stylet Placement Confirmation: ETT inserted through vocal cords under direct vision Secured at: 22 cm Tube secured with: Tape Dental Injury: Teeth and Oropharynx as per pre-operative assessment

## 2024-05-17 ENCOUNTER — Ambulatory Visit (HOSPITAL_COMMUNITY): Admission: RE | Admit: 2024-05-17 | Source: Ambulatory Visit

## 2024-05-17 ENCOUNTER — Encounter (HOSPITAL_COMMUNITY): Payer: Self-pay

## 2024-05-17 ENCOUNTER — Inpatient Hospital Stay (HOSPITAL_COMMUNITY)

## 2024-05-17 DIAGNOSIS — J9621 Acute and chronic respiratory failure with hypoxia: Secondary | ICD-10-CM | POA: Diagnosis not present

## 2024-05-17 LAB — VITAMIN B12: Vitamin B-12: 161 pg/mL — ABNORMAL LOW (ref 180–914)

## 2024-05-17 LAB — BASIC METABOLIC PANEL WITH GFR
Anion gap: 10 (ref 5–15)
BUN: 21 mg/dL (ref 8–23)
CO2: 24 mmol/L (ref 22–32)
Calcium: 9.5 mg/dL (ref 8.9–10.3)
Chloride: 102 mmol/L (ref 98–111)
Creatinine, Ser: 0.7 mg/dL (ref 0.44–1.00)
GFR, Estimated: >60 mL/min (ref >60)
Glucose, Bld: 126 mg/dL — ABNORMAL HIGH (ref 70–99)
Potassium: 4.7 mmol/L (ref 3.5–5.1)
Sodium: 136 mmol/L (ref 135–145)

## 2024-05-17 LAB — CBC WITH DIFFERENTIAL/PLATELET
Abs Immature Granulocytes: 0.14 10*3/uL — ABNORMAL HIGH (ref 0.00–0.07)
Basophils Absolute: 0 10*3/uL (ref 0.0–0.1)
Basophils Relative: 0 %
Eosinophils Absolute: 0 10*3/uL (ref 0.0–0.5)
Eosinophils Relative: 0 %
HCT: 34.2 % — ABNORMAL LOW (ref 36.0–46.0)
Hemoglobin: 9.9 g/dL — ABNORMAL LOW (ref 12.0–15.0)
Immature Granulocytes: 1 %
Lymphocytes Relative: 4 %
Lymphs Abs: 0.5 10*3/uL — ABNORMAL LOW (ref 0.7–4.0)
MCH: 27 pg (ref 26.0–34.0)
MCHC: 28.9 g/dL — ABNORMAL LOW (ref 30.0–36.0)
MCV: 93.2 fL (ref 80.0–100.0)
Monocytes Absolute: 0.6 10*3/uL (ref 0.1–1.0)
Monocytes Relative: 4 %
Neutro Abs: 12.3 10*3/uL — ABNORMAL HIGH (ref 1.7–7.7)
Neutrophils Relative %: 91 %
Platelets: 304 10*3/uL (ref 150–400)
RBC: 3.67 MIL/uL — ABNORMAL LOW (ref 3.87–5.11)
RDW: 16.9 % — ABNORMAL HIGH (ref 11.5–15.5)
WBC: 13.6 10*3/uL — ABNORMAL HIGH (ref 4.0–10.5)
nRBC: 0 % (ref 0.0–0.2)

## 2024-05-17 LAB — IRON AND TIBC
Iron: 17 ug/dL — ABNORMAL LOW (ref 28–170)
Saturation Ratios: 4 % — ABNORMAL LOW (ref 10.4–31.8)
TIBC: 406 ug/dL (ref 250–450)
UIBC: 389 ug/dL

## 2024-05-17 LAB — FERRITIN: Ferritin: 17 ng/mL (ref 11–307)

## 2024-05-17 LAB — RETICULOCYTES
Immature Retic Fract: 14.2 % (ref 2.3–15.9)
RBC.: 3.69 MIL/uL — ABNORMAL LOW (ref 3.87–5.11)
Retic Count, Absolute: 99.6 10*3/uL (ref 19.0–186.0)
Retic Ct Pct: 2.7 % (ref 0.4–3.1)

## 2024-05-17 LAB — FOLATE: Folate: 10.6 ng/mL (ref >5.9)

## 2024-05-17 MED ORDER — GUAIFENESIN ER 600 MG PO TB12
600.0000 mg | ORAL_TABLET | Freq: Two times a day (BID) | ORAL | Status: DC
  Administered 2024-05-17 – 2024-05-18 (×3): 600 mg via ORAL
  Filled 2024-05-17 (×3): qty 1

## 2024-05-17 MED ORDER — SODIUM CHLORIDE 3 % IN NEBU
4.0000 mL | INHALATION_SOLUTION | Freq: Two times a day (BID) | RESPIRATORY_TRACT | Status: DC
Start: 2024-05-17 — End: 2024-05-18
  Administered 2024-05-17 (×2): 4 mL via RESPIRATORY_TRACT
  Filled 2024-05-17 (×3): qty 4

## 2024-05-17 NOTE — Progress Notes (Addendum)
 05/17/2024 Seen in f/u for abnormal imaging.  S Feels the same, still lots of trouble with expectoration.  O Exam with more wheezing today, weak cough, otherwise stable.  Low iron  noted Pct neg Sed normal  A: Ongoing severe symptoms related to mucus plugging of bronchi.  Character of secretions are so thick that expectoration is nearly impossible (was very difficult to remove with bronch).  P:  DC antimuscarinics to help thin secretions Mucinex  Trial of CPT plus hypertonic F/u bronch results Continue steroids Follow up MBSS Going to be tough to get her to spot where she feels comfortable at home; will follow  Ardelle Kos MD PCCM

## 2024-05-17 NOTE — Progress Notes (Signed)
 SpO2 on 4L at rest 95%. RN ambulated with pt in hall. Pt reports shortness of breath while ambulating. Tachycardia noted as well. HR increasing to 120s. SpO2 lowest reading was 87% on 4L. Pt mostly maintained 88% on 4L while ambulating. Pt ambulated back to room and sat on side of bed. SpO2 up to 95% on 4L post ambulation. MD notified.

## 2024-05-17 NOTE — Progress Notes (Signed)
   05/17/24 2000  Chest Physiotherapy Tx  $ Chest Physiotherapy by Hand (CPT) Subsequent  $ Chest Vest Small Yes  CPT Delivery Source Chest vest  CPT Duration 10  CPT Chest Site Full range  Post-Treatment Respirations 20  Sputum Amount None  Sputum Color Other (Comment)  Sputum Consistency Other (Comment)  Sputum Specimen Source Other (Comment)  Position Sitting  CPT Treatment Tolerance Tolerated well  Post-Treatment Pulse 105  Cough Occasional  Respiratory  Bilateral Breath Sounds Rhonchi;Diminished  R Upper  Breath Sounds Diminished;Rhonchi  L Upper Breath Sounds Diminished  R Lower Breath Sounds Rhonchi;Diminished  L Lower Breath Sounds Diminished   Patient tolerated chest vest well. Cough is frequent but nor productive, Patient is congested upon exam. CPT is scheduled as every 4 hours but patient has declined the midnight and 4 am procedures. She is in no distress, spo2 98% on 4L Breanna Ramirez

## 2024-05-17 NOTE — Procedures (Signed)
 Modified Barium Swallow Study  Patient Details  Name: Breanna Ramirez MRN: [id number] Date of Birth: 01-08-1956  Today's Date: 05/17/2024  Modified Barium Swallow completed.  Full report located under Chart Review in the Imaging Section.  History of Present Illness Patient is a 68 y.o. female with PMH: HTN, HLD, CHF, GERD, COPD, former smoker, pulmonary fibrosis, chronic anemia on home oxygen . She was hospitalized two months ago with PNA. She presented to the hospital on 05/15/24 as her breathing had not improved since that prior hospitalization and in addition she was having wheezing, coughing. CT angio/chest/PE showed progression of emphysema and multifocal bhilateral confluent airspace disease; PNA versus atelectasis. Pulmonary critical care MD ordered MBS to r/o aspiration.   Clinical Impression Patient presents with a mild oropharyngeal dysphagia with an esophageal component as well. Anterior hyoid excursion was partial in completion and although epiglottic inversion and laryngeal vestibule closure appeared complete, they were delayed, resulting in instances of penetration above the vocal cords which cleared laryngeal vestibule (PAS 2) with thin liquids. PES opening was partial in duration and distention, resulting in diffuse pharyngeal residuals with solids and liquids. Dry swallow helped to clear pharyngeal residuals, though patient did not exhibit sensation to any pharyngeal residuals. 13mm barium tablet transited through pharynx, PES and esophagus without difficulty. During esophageal sweep, barium stasis, questionable narrowing at portion of distal esophagus with retrograde movement that stayed within the esophagus. Factors that may increase risk of adverse event in presence of aspiration Roderick Civatte & Jessy Morocco 2021): Poor general health and/or compromised immunity;Frail or deconditioned  Swallow Evaluation Recommendations Recommendations: PO diet PO Diet Recommendation: Regular;Thin liquids  (Level 0) Liquid Administration via: Cup;Straw Medication Administration: Whole meds with liquid Supervision: Patient able to self-feed Swallowing strategies  : Slow rate;Small bites/sips Postural changes: Position pt fully upright for meals;Stay upright 30-60 min after meals Oral care recommendations: Oral care BID (2x/day)     Jacqualine Mater, MA, CCC-SLP Speech Therapy

## 2024-05-17 NOTE — Plan of Care (Signed)

## 2024-05-17 NOTE — Progress Notes (Addendum)
 PROGRESS NOTE  Breanna Ramirez  DOB: [birth year]  PCP: Narda Bacon, MD JYN:[id number]  DOA: 05/15/2024  LOS: 2 days  Hospital Day: 3  Brief narrative: Breanna Ramirez is a 68 y.o. female with PMH significant for HTN, HLD, CHF, GERD, COPD, past smoking, pulmonary fibrosis, chronic anemia on home oxygen , was recently hospitalized about 2 months ago for pneumonia and was discharged 5/18, patient presented to ED stating that her breathing has not really improved since then.  Also reported wheezing, coughing.  She has been requiring higher flow oxygen  at home from 2L to 4Lpm.  In the ED, patient was afebrile,, heart rate 80s, blood pressure in 90s and low 100s, breathing on 3 L oxygen  by nasal cannula. Labs with WC count 13.9, hemoglobin 10.7, sodium 134, procalcitonin less than 0.1 Respiratory virus panel unremarkable CT angio chest was negative for PE.  Showed progression of emphysema, also showed multifocal bilateral confluent airspace diseases, pneumonia versus atelectasis  Patient was started on IV Rocephin , IV azithromycin , IV fluid, bronchodilators Admitted to [facility]  Subjective: Patient was seen and examined this morning. Propped up on bed.  Not in distress.  Coughing and deep breathing.  Hard to expectorate out. Noted bronchoscopy findings from yesterday.  Patient had thick mucus which is hard to expectorate.  She has mucous plugging due to those. On ambulation in the hallway today, desatted down to 80s, and required supplemental oxygen .  Assessment and plan: Mucous plugging of bronchi Acute on chronic respiratory failure with hypoxia COPD Patient presented with persistence of respiratory symptoms with some progression Imaging with progression of emphysema as well as presence of multifocal bilateral airspace opacities Pulmonary consult appreciated. 5/19 underwent bronchoscopy.  Noted to have severe mucous plugging with thick sputum unable to expectorate out. Currently on IV steroids,  chest physiotherapy plus hypertonic saline nebs, Mucinex  Antibiotics have been stopped.  To avoid antimuscarinics to help thin secretions. Desats on ambulation.  Continue to encourage daily ambulation Recent Labs  Lab 05/15/24 1134 05/15/24 1621 05/15/24 1830 05/16/24 0459 05/16/24 0706 05/17/24 0544  WBC 13.9*  --   --  10.0 DUPL @0712  KAM 13.6*  PROCALCITON  --  <0.10 <0.10 <0.10  --   --    Chronic diastolic CHF  Hypertension Blood pressure low normal range.  No evidence of volume overload.   HCTZ on hold Continue monitor  Chronic iron  deficiency anemia GERD Continue PPI Hemoglobin stable between 9 and 10 Recent Labs    09/21/23 1810 09/22/23 0526 05/15/24 1134 05/15/24 1143 05/16/24 0459 05/16/24 0706 05/17/24 0544  HGB 9.1*   < > 10.7* 12.2 9.3* DUPL @0712  KAM 9.9*  MCV 78.0*   < > 86.8  --  91.7 DUPL @0712  KAM 93.2  VITAMINB12 386  --   --   --   --   --  161*  FOLATE 24.1  --   --   --   --   --  10.6  FERRITIN 12  --   --   --   --   --  17  TIBC 531*  --   --   --   --   --  406  IRON  24*  --   --   --   --   --  17*  RETICCTPCT 4.4*  --   --   --   --   --  2.7   < > = values in this interval not displayed.    HLD Continue statin  Mobility: Independently able to ambulate at baseline.  Goals of care   Code Status: Full Code     DVT prophylaxis:  SCDs Start: 05/15/24 1447   Antimicrobials: Not on antibiotics Fluid: None Consultants: Pulmonology Family Communication: None at bedside  Status: Inpatient Level of care:  Telemetry   Patient is from: Home Needs to continue in-hospital care: Continues to have desaturation Anticipated d/c to: Hopefully home in 2 to 3 days      Diet:  Diet Order             Diet regular Room service appropriate? Yes; Fluid consistency: Thin  Diet effective now                   Scheduled Meds:  arformoterol   15 mcg Nebulization BID   budesonide  (PULMICORT ) nebulizer solution  0.5 mg Nebulization  BID   guaiFENesin   600 mg Oral BID   methylPREDNISolone  (SOLU-MEDROL ) injection  40 mg Intravenous Q12H   montelukast   10 mg Oral QHS   pantoprazole   40 mg Oral BID   simvastatin   40 mg Oral QHS   sodium chloride  HYPERTONIC  4 mL Nebulization BID    PRN meds: acetaminophen  **OR** acetaminophen , fentaNYL  (SUBLIMAZE ) injection, guaiFENesin -dextromethorphan , ondansetron  **OR** ondansetron  (ZOFRAN ) IV, ondansetron  (ZOFRAN ) IV, oxyCODONE  **OR** oxyCODONE    Infusions:     Antimicrobials: Anti-infectives (From admission, onward)    Start     Dose/Rate Route Frequency Ordered Stop   05/16/24 1500  azithromycin  (ZITHROMAX ) 500 mg in sodium chloride  0.9 % 250 mL IVPB  Status:  Discontinued        500 mg 250 mL/hr over 60 Minutes Intravenous Every 24 hours 05/15/24 1439 05/16/24 0721   05/15/24 1445  ceFEPIme  (MAXIPIME ) 2 g in sodium chloride  0.9 % 100 mL IVPB  Status:  Discontinued        2 g 200 mL/hr over 30 Minutes Intravenous Every 8 hours 05/15/24 1439 05/16/24 0721   05/15/24 1345  cefTRIAXone  (ROCEPHIN ) 1 g in sodium chloride  0.9 % 100 mL IVPB        1 g 200 mL/hr over 30 Minutes Intravenous  Once 05/15/24 1337 05/15/24 1447   05/15/24 1345  azithromycin  (ZITHROMAX ) 500 mg in sodium chloride  0.9 % 250 mL IVPB  Status:  Discontinued        500 mg 250 mL/hr over 60 Minutes Intravenous  Once 05/15/24 1337 05/16/24 0721       Objective: Vitals:   05/17/24 0803 05/17/24 0808  BP:    Pulse:    Resp:    Temp:    SpO2: 97% 97%    Intake/Output Summary (Last 24 hours) at 05/17/2024 1357 Last data filed at 05/16/2024 2131 Gross per 24 hour  Intake 370 ml  Output 0 ml  Net 370 ml   Filed Weights   05/15/24 1512  Weight: 57.2 kg   Weight change:  Body mass index is 21.65 kg/m.   Physical Exam: General exam: Pleasant, elderly Caucasian female.  Not in distress at rest Skin: No rashes, lesions or ulcers. HEENT: Atraumatic, normocephalic, no obvious bleeding Lungs: Clear  to auscultation bilaterally, no wheezing.  Coughs on deep breathing.  Tries but hard to be expectorate out phlegm CVS: S1, S2, has murmur   GI/Abd: Soft, nontender, nondistended, bowel sound present,   CNS: Alert, awake, alert x 3 Psychiatry: Mood appropriate,  Extremities: No pedal edema, no calf tenderness,   Data Review: I have personally reviewed the laboratory data and  studies available.  F/u labs ordered Unresulted Labs (From admission, onward)     Start     Ordered   05/16/24 1348  Acid Fast Culture with reflexed sensitivities  RELEASE UPON ORDERING,   TIMED       Comments: Specimen B: Specimen Description [facility] Phone [phone]         Previous Biopsy:  no Is the patient on airborne/droplet precautions? No           Clinical History:  N/A Copy of Report to:  N/A Specimen Disposition: Microbiology and Cytology     05/16/24 1348   05/16/24 1348  Acid Fast Smear (AFB)  RELEASE UPON ORDERING,   TIMED       Comments: Specimen B: Specimen Description [facility] Phone [phone]         Previous Biopsy:  no Is the patient on airborne/droplet precautions? No           Clinical History:  N/A Copy of Report to:  N/A Specimen Disposition: Microbiology and Cytology     05/16/24 1348   05/16/24 1348  Fungus Culture With Stain  RELEASE UPON ORDERING,   TIMED       Comments: Specimen B: Specimen Description [facility] Phone [phone]         Previous Biopsy:  no Is the patient on airborne/droplet precautions? No           Clinical History:  N/A Copy of Report to:  N/A Specimen Disposition: Microbiology and Cytology     05/16/24 1348   05/16/24 1347  Fungus Culture With Stain  RELEASE UPON ORDERING,   TIMED       Comments: Specimen A: Specimen Description [facility] Phone [phone]         Previous Biopsy:  no Is the patient on airborne/droplet precautions? No           Clinical History:  N/A Copy of Report to:  N/A Specimen Disposition: Microbiology and Cytology      05/16/24 1347   05/16/24 1347  Acid Fast Culture with reflexed sensitivities  RELEASE UPON ORDERING,   TIMED       Comments: Specimen A: Specimen Description [facility] Phone [phone]         Previous Biopsy:  no Is the patient on airborne/droplet precautions? No           Clinical History:  N/A Copy of Report to:  N/A Specimen Disposition: Microbiology and Cytology     05/16/24 1347   05/16/24 1347  Acid Fast Smear (AFB)  RELEASE UPON ORDERING,   TIMED       Comments: Specimen A: Specimen Description [facility] Phone [phone]         Previous Biopsy:  no Is the patient on airborne/droplet precautions? No           Clinical History:  N/A Copy of Report to:  N/A Specimen Disposition: Microbiology and Cytology     05/16/24 1347   05/15/24 1439  Expectorated Sputum Assessment w Gram Stain, Rflx to Resp Cult  (Severe pneumonia (requires ICU care) in adults without resistant organism risk factors )  ONCE - URGENT,   URGENT        05/15/24 1439   05/15/24 1438  Legionella Pneumophila Serogp 1 Ur Ag  Once,   R        05/15/24 1439   05/15/24 1438  Expectorated Sputum Assessment w Gram Stain, Rflx to Resp Cult  Once,  R        05/15/24 1439            Signed, Hoyt Macleod, MD [facility] 05/17/2024

## 2024-05-17 NOTE — Progress Notes (Signed)
 Pt does not want flutter valve as order. Pt states she has one at home and feels its does  not help.

## 2024-05-17 NOTE — Anesthesia Postprocedure Evaluation (Signed)
 Anesthesia Post Note  Patient: Breanna Ramirez  Procedure(s) Performed: BRONCHOSCOPY, FLEXIBLE (Bilateral) IRRIGATION, BRONCHUS     Patient location during evaluation: PACU Anesthesia Type: General Level of consciousness: awake and alert Pain management: pain level controlled Vital Signs Assessment: post-procedure vital signs reviewed and stable Respiratory status: spontaneous breathing, nonlabored ventilation, respiratory function stable and patient connected to nasal cannula oxygen  Cardiovascular status: blood pressure returned to baseline and stable Postop Assessment: no apparent nausea or vomiting Anesthetic complications: no   No notable events documented.  Last Vitals:  Vitals:   05/17/24 0803 05/17/24 0808  BP:    Pulse:    Resp:    Temp:    SpO2: 97% 97%    Last Pain:  Vitals:   05/17/24 0918  TempSrc:   PainSc: 0-No pain                 Saifullah Jolley S

## 2024-05-18 ENCOUNTER — Inpatient Hospital Stay (HOSPITAL_COMMUNITY)

## 2024-05-18 ENCOUNTER — Telehealth: Payer: Self-pay | Admitting: Internal Medicine

## 2024-05-18 ENCOUNTER — Encounter (HOSPITAL_COMMUNITY): Payer: Self-pay | Admitting: Internal Medicine

## 2024-05-18 ENCOUNTER — Other Ambulatory Visit (HOSPITAL_COMMUNITY): Payer: Self-pay

## 2024-05-18 DIAGNOSIS — J9621 Acute and chronic respiratory failure with hypoxia: Secondary | ICD-10-CM | POA: Diagnosis not present

## 2024-05-18 LAB — ACID FAST SMEAR (AFB, MYCOBACTERIA)
Acid Fast Smear: NEGATIVE
Acid Fast Smear: NEGATIVE

## 2024-05-18 LAB — LEGIONELLA PNEUMOPHILA SEROGP 1 UR AG: L. pneumophila Serogp 1 Ur Ag: NEGATIVE

## 2024-05-18 LAB — CYTOLOGY - NON PAP

## 2024-05-18 MED ORDER — ACETYLCYSTEINE 20 % IN SOLN
4.0000 mL | RESPIRATORY_TRACT | Status: AC
Start: 2024-05-18 — End: 2024-05-18
  Administered 2024-05-18: 4 mL via RESPIRATORY_TRACT
  Filled 2024-05-18: qty 4

## 2024-05-18 MED ORDER — PREDNISONE 20 MG PO TABS
40.0000 mg | ORAL_TABLET | Freq: Every day | ORAL | 0 refills | Status: AC
  Filled 2024-05-18: qty 8, 4d supply, fill #0

## 2024-05-18 MED ORDER — IPRATROPIUM-ALBUTEROL 0.5-2.5 (3) MG/3ML IN SOLN
3.0000 mL | RESPIRATORY_TRACT | Status: DC
  Filled 2024-05-18: qty 3

## 2024-05-18 MED ORDER — FLUTICASONE FUROATE-VILANTEROL 100-25 MCG/ACT IN AEPB
1.0000 | INHALATION_SPRAY | Freq: Every day | RESPIRATORY_TRACT | 0 refills | Status: DC
  Filled 2024-05-18: qty 60, 30d supply, fill #0

## 2024-05-18 MED ORDER — GUAIFENESIN ER 600 MG PO TB12
600.0000 mg | ORAL_TABLET | Freq: Two times a day (BID) | ORAL | 0 refills | Status: AC
  Filled 2024-05-18: qty 20, 10d supply, fill #0

## 2024-05-18 MED ORDER — ALBUTEROL SULFATE HFA 108 (90 BASE) MCG/ACT IN AERS
2.0000 | INHALATION_SPRAY | Freq: Four times a day (QID) | RESPIRATORY_TRACT | 0 refills | Status: AC | PRN
Start: 2024-05-18 — End: ?
  Filled 2024-05-18: qty 6.7, 25d supply, fill #0

## 2024-05-18 NOTE — Telephone Encounter (Signed)
 Hospitalized for recurrent mucus plugging in middle and lower lobes BL.  No improvement after bronch and trial of pulmonary toileting.  Steroids/abx no help.  Awaiting final culture data (?AFB), 1-2 week f/u in clinic to review these and do disability paperwork given ongoing severity of symptoms.  Happy to do the paperwork if someone can get them to me.  Ardelle Kos MD PCCM

## 2024-05-18 NOTE — Progress Notes (Signed)
 Discharge medications delivered to patient at bedside [facility]

## 2024-05-18 NOTE — Plan of Care (Signed)
 Problem: Education: Goal: Knowledge of General Education information will improve Description: Including pain rating scale, medication(s)/side effects and non-pharmacologic comfort measures 05/18/2024 1419 by Vesta Gourd, RN Outcome: Adequate for Discharge 05/18/2024 1419 by Vesta Gourd, RN Outcome: Progressing   Problem: Health Behavior/Discharge Planning: Goal: Ability to manage health-related needs will improve 05/18/2024 1419 by Vesta Gourd, RN Outcome: Adequate for Discharge 05/18/2024 1419 by Vesta Gourd, RN Outcome: Progressing   Problem: Clinical Measurements: Goal: Ability to maintain clinical measurements within normal limits will improve 05/18/2024 1419 by Vesta Gourd, RN Outcome: Adequate for Discharge 05/18/2024 1419 by Vesta Gourd, RN Outcome: Progressing Goal: Will remain free from infection 05/18/2024 1419 by Vesta Gourd, RN Outcome: Adequate for Discharge 05/18/2024 1419 by Vesta Gourd, RN Outcome: Progressing Goal: Diagnostic test results will improve 05/18/2024 1419 by Vesta Gourd, RN Outcome: Adequate for Discharge 05/18/2024 1419 by Vesta Gourd, RN Outcome: Progressing Goal: Respiratory complications will improve 05/18/2024 1419 by Vesta Gourd, RN Outcome: Adequate for Discharge 05/18/2024 1419 by Vesta Gourd, RN Outcome: Progressing Goal: Cardiovascular complication will be avoided 05/18/2024 1419 by Vesta Gourd, RN Outcome: Adequate for Discharge 05/18/2024 1419 by Vesta Gourd, RN Outcome: Progressing   Problem: Activity: Goal: Risk for activity intolerance will decrease 05/18/2024 1419 by Vesta Gourd, RN Outcome: Adequate for Discharge 05/18/2024 1419 by Vesta Gourd, RN Outcome: Progressing   Problem: Nutrition: Goal: Adequate nutrition will be maintained 05/18/2024 1419 by Vesta Gourd, RN Outcome: Adequate for Discharge 05/18/2024 1419 by Vesta Gourd, RN Outcome: Progressing   Problem: Coping: Goal: Level of anxiety will decrease 05/18/2024 1419 by Vesta Gourd, RN Outcome: Adequate for Discharge 05/18/2024 1419 by Vesta Gourd, RN Outcome: Progressing   Problem: Elimination: Goal: Will not experience complications related to bowel motility 05/18/2024 1419 by Vesta Gourd, RN Outcome: Adequate for Discharge 05/18/2024 1419 by Vesta Gourd, RN Outcome: Progressing Goal: Will not experience complications related to urinary retention 05/18/2024 1419 by Vesta Gourd, RN Outcome: Adequate for Discharge 05/18/2024 1419 by Vesta Gourd, RN Outcome: Progressing   Problem: Pain Managment: Goal: General experience of comfort will improve and/or be controlled 05/18/2024 1419 by Vesta Gourd, RN Outcome: Adequate for Discharge 05/18/2024 1419 by Vesta Gourd, RN Outcome: Progressing   Problem: Safety: Goal: Ability to remain free from injury will improve 05/18/2024 1419 by Vesta Gourd, RN Outcome: Adequate for Discharge 05/18/2024 1419 by Vesta Gourd, RN Outcome: Progressing   Problem: Skin Integrity: Goal: Risk for impaired skin integrity will decrease 05/18/2024 1419 by Vesta Gourd, RN Outcome: Adequate for Discharge 05/18/2024 1419 by Vesta Gourd, RN Outcome: Progressing   Problem: Activity: Goal: Ability to tolerate increased activity will improve 05/18/2024 1419 by Vesta Gourd, RN Outcome: Adequate for Discharge 05/18/2024 1419 by Vesta Gourd, RN Outcome: Progressing   Problem: Clinical Measurements: Goal: Ability to maintain a body temperature in the normal range will improve 05/18/2024 1419 by Vesta Gourd, RN Outcome: Adequate for Discharge 05/18/2024 1419 by Vesta Gourd, RN Outcome: Progressing   Problem: Respiratory: Goal: Ability to maintain adequate ventilation will improve 05/18/2024 1419 by Vesta Gourd, RN Outcome: Adequate  for Discharge 05/18/2024 1419 by Vesta Gourd, RN Outcome: Progressing Goal: Ability to maintain a clear airway will improve 05/18/2024 1419 by Vesta Gourd, RN Outcome: Adequate for Discharge 05/18/2024 1419 by Vesta Gourd, RN Outcome:  Progressing

## 2024-05-18 NOTE — Discharge Summary (Addendum)
 Physician Discharge Summary  Breanna Ramirez ZOX:[id number] DOB: 09-06-1956 DOA: 05/15/2024  PCP: Narda Bacon, MD  Admit date: 05/15/2024 Discharge date: 05/18/2024  Admitted From: Home Discharge disposition: Home  Recommendations at discharge:  Per pulmonology, to switch from Trelegy to Breo, DuoNeb to albuterol  neb as needed (To avoid antimuscarinics to help thin secretions). Prednisone  40 mg daily for 4 more days then stop Follow-up with pulmonology in the office    Brief narrative: Breanna Ramirez is a 68 y.o. female with PMH significant for HTN, HLD, CHF, GERD, COPD, past smoking, pulmonary fibrosis, chronic anemia on home oxygen , was recently hospitalized about 2 months ago for pneumonia and was discharged 5/18, patient presented to ED stating that her breathing has not really improved since then.  Also reported wheezing, coughing.  She has been requiring higher flow oxygen  at home from 2L to 4Lpm.  In the ED, patient was afebrile,, heart rate 80s, blood pressure in 90s and low 100s, breathing on 3 L oxygen  by nasal cannula. Labs with WC count 13.9, hemoglobin 10.7, sodium 134, procalcitonin less than 0.1 Respiratory virus panel unremarkable CT angio chest was negative for PE.  Showed progression of emphysema, also showed multifocal bilateral confluent airspace diseases, pneumonia versus atelectasis  Patient was started on IV Rocephin , IV azithromycin , IV fluid, bronchodilators Admitted to [facility] Pulmonary consultation was obtained.  Subjective: Patient was seen and examined this morning. Sitting up at the edge of the bed.  Not in distress. On auscultation, coughs on deep breathing.  Seem to have thick cough hard to expectorate out.  Ambulated on the hallway today.  Required supplemental oxygen   Hospital course: Mucous plugging of bronchi Acute on chronic respiratory failure with hypoxia COPD Patient presented with persistence of respiratory symptoms with some  progression Imaging with progression of emphysema as well as presence of multifocal bilateral airspace opacities. Pulmonary consult appreciated. 5/19 underwent bronchoscopy.  Noted to have severe mucous plugging with thick sputum unable to expectorate out. Currently on IV steroids, Mucinex , chest physiotherapy plus hypertonic saline nebs Not on antibiotics.   O2 sat down ambulation.  Requires supplemental oxygen .  Encourage ambulation.   Per PCCM recommendation, esophagram was obtained which was normal.  1 dose of Mucomyst was given this morning. Not present benefit. Per pulm recommendation, -To switch from Trelegy to Breo, DuoNeb to albuterol  neb as needed (To avoid antimuscarinics to help thin secretions). -Prednisone  40 mg daily for 4 more days then stop -Follow-up with pulmonology in the office Recent Labs  Lab 05/15/24 1134 05/15/24 1621 05/15/24 1830 05/16/24 0459 05/16/24 0706 05/17/24 0544  WBC 13.9*  --   --  10.0 DUPL @0712  KAM 13.6*  PROCALCITON  --  <0.10 <0.10 <0.10  --   --    Chronic diastolic CHF  Hypertension Blood pressure low normal range.  No evidence of volume overload.   HCTZ on hold.  Continue to monitor blood pressure at home.  Chronic iron  deficiency anemia GERD Continue PPI Hemoglobin stable between 9 and 10 Recent Labs    09/21/23 1810 09/22/23 0526 05/15/24 1134 05/15/24 1143 05/16/24 0459 05/16/24 0706 05/17/24 0544  HGB 9.1*   < > 10.7* 12.2 9.3* DUPL @0712  KAM 9.9*  MCV 78.0*   < > 86.8  --  91.7 DUPL @0712  KAM 93.2  VITAMINB12 386  --   --   --   --   --  161*  FOLATE 24.1  --   --   --   --   --  10.6  FERRITIN 12  --   --   --   --   --  17  TIBC 531*  --   --   --   --   --  406  IRON  24*  --   --   --   --   --  17*  RETICCTPCT 4.4*  --   --   --   --   --  2.7   < > = values in this interval not displayed.    HLD Continue statin    Mobility: Independently able to ambulate at baseline.  Goals of care   Code Status: Full  Code   Diet:  Diet Order             Diet general           Diet regular Room service appropriate? Yes; Fluid consistency: Thin  Diet effective now                   Nutritional status:  Body mass index is 21.65 kg/m.       Wounds:  -    Discharge Exam:   Vitals:   05/18/24 0501 05/18/24 0759 05/18/24 0806 05/18/24 1139  BP: 115/61   118/62  Pulse: 66   90  Resp: 14   20  Temp: 98.3 F (36.8 C)   97.8 F (36.6 C)  TempSrc: Oral   Oral  SpO2: 98% 98% 98% 95%  Weight:      Height:        Body mass index is 21.65 kg/m.  General exam: Pleasant, elderly Caucasian female.  Not in distress at rest Skin: No rashes, lesions or ulcers. HEENT: Atraumatic, normocephalic, no obvious bleeding Lungs: Clear to auscultation bilaterally, no wheezing.  Coughs on deep breathing.  Tries but hard to be expectorate out phlegm CVS: S1, S2, has murmur   GI/Abd: Soft, nontender, nondistended, bowel sound present,   CNS: Alert, awake, alert x 3 Psychiatry: Mood appropriate,  Extremities: No pedal edema, no calf tenderness,   Follow ups:    Follow-up Information     Narda Bacon, MD Follow up.   Specialty: Internal Medicine Contact information: [address] [place] [address] [phone]                 Discharge Instructions:   Discharge Instructions     Call MD for:  difficulty breathing, headache or visual disturbances   Complete by: As directed    Call MD for:  extreme fatigue   Complete by: As directed    Call MD for:  hives   Complete by: As directed    Call MD for:  persistant dizziness or light-headedness   Complete by: As directed    Call MD for:  persistant nausea and vomiting   Complete by: As directed    Call MD for:  severe uncontrolled pain   Complete by: As directed    Call MD for:  temperature >100.4   Complete by: As directed    Diet general   Complete by: As directed    Discharge instructions   Complete by: As directed     Recommendations at discharge:   Per pulmonology, to switch from Trelegy to Breo, DuoNeb to albuterol  neb as needed (To avoid antimuscarinics to help thin secretions).  Prednisone  40 mg daily for 4 more days then stop  Follow-up with pulmonology in the office  General discharge instructions: Follow with Primary MD  Narda Bacon, MD in 7 days  Please request your PCP  to go over your hospital tests, procedures, radiology results at the follow up. Please get your medicines reviewed and adjusted.  Your PCP may decide to repeat certain labs or tests as needed. Do not drive, operate heavy machinery, perform activities at heights, swimming or participation in water activities or provide baby sitting services if your were admitted for syncope or siezures until you have seen by Primary MD or a Neurologist and advised to do so again. [place]  Controlled Substance Reporting System database was reviewed. Do not drive, operate heavy machinery, perform activities at heights, swim, participate in water activities or provide baby-sitting services while on medications for pain, sleep and mood until your outpatient physician has reevaluated you and advised to do so again.  You are strongly recommended to comply with the dose, frequency and duration of prescribed medications. Activity: As tolerated with Full fall precautions use walker/cane & assistance as needed Avoid using any recreational substances like cigarette, tobacco, alcohol, or non-prescribed drug. If you experience worsening of your admission symptoms, develop shortness of breath, life threatening emergency, suicidal or homicidal thoughts you must seek medical attention immediately by calling 911 or calling your MD immediately  if symptoms less severe. You must read complete instructions/literature along with all the possible adverse reactions/side effects for all the medicines you take and that have been prescribed to you. Take any new medicine only after  you have completely understood and accepted all the possible adverse reactions/side effects.  Wear Seat belts while driving. You were cared for by a hospitalist during your hospital stay. If you have any questions about your discharge medications or the care you received while you were in the hospital after you are discharged, you can call the unit and ask to speak with the hospitalist or the covering physician. Once you are discharged, your primary care physician will handle any further medical issues. Please note that NO REFILLS for any discharge medications will be authorized once you are discharged, as it is imperative that you return to your primary care physician (or establish a relationship with a primary care physician if you do not have one).   Increase activity slowly   Complete by: As directed        Discharge Medications:   Allergies as of 05/18/2024       Reactions   Codeine Nausea And Vomiting        Medication List     STOP taking these medications    ipratropium-albuterol  0.5-2.5 (3) MG/3ML Soln Commonly known as: DUONEB   Trelegy Ellipta  200-62.5-25 MCG/ACT Aepb Generic drug: Fluticasone -Umeclidin-Vilant       TAKE these medications    albuterol  108 (90 Base) MCG/ACT inhaler Commonly known as: VENTOLIN  HFA Inhale 2 puffs into the lungs every 6 (six) hours as needed for wheezing or shortness of breath.   BC HEADACHE POWDER PO Take 1 packet by mouth as needed.   cholecalciferol 25 MCG (1000 UNIT) tablet Commonly known as: VITAMIN D3 Take 1,000 Units by mouth every other day.   esomeprazole  40 MG capsule Commonly known as: NEXIUM  Take 1 capsule (40 mg total) by mouth daily. What changed: when to take this   fluticasone  50 MCG/ACT nasal spray Commonly known as: FLONASE  Place 1 spray into both nostrils every evening.   fluticasone  furoate-vilanterol 100-25 MCG/ACT Aepb Commonly known as: Breo Ellipta  Inhale 1 puff into the lungs daily.    guaiFENesin  600 MG 12  hr tablet Commonly known as: MUCINEX  Take 1 tablet (600 mg total) by mouth 2 (two) times daily for 14 days.   hydrochlorothiazide  25 MG tablet Commonly known as: HYDRODIURIL  Take 1 tablet (25 mg total) by mouth daily. What changed: when to take this   ibuprofen 200 MG tablet Commonly known as: ADVIL Take 400 mg by mouth as needed for mild pain (pain score 1-3) or moderate pain (pain score 4-6).   montelukast  10 MG tablet Commonly known as: SINGULAIR  Take 1 tablet (10 mg total) by mouth at bedtime.   Ohtuvayre  3 MG/2.5ML Susp Generic drug: Ensifentrine  Inhale 1 Inhalation into the lungs 2 (two) times daily.   predniSONE  20 MG tablet Commonly known as: DELTASONE  Take 2 tablets (40 mg total) by mouth daily with breakfast for 4 days. What changed:  medication strength See the new instructions.   simvastatin  40 MG tablet Commonly known as: ZOCOR  Take 1 tablet (40 mg total) by mouth at bedtime.         The results of significant diagnostics from this hospitalization (including imaging, microbiology, ancillary and laboratory) are listed below for reference.    Procedures and Diagnostic Studies:   CT Angio Chest PE W/Cm &/Or Wo Cm Result Date: 05/15/2024 CLINICAL DATA:  68 year old female treated for pneumonia. Persistent symptoms, shortness of breath. EXAM: CT ANGIOGRAPHY CHEST WITH CONTRAST TECHNIQUE: Multidetector CT imaging of the chest was performed using the standard protocol during bolus administration of intravenous contrast. Multiplanar CT image reconstructions and MIPs were obtained to evaluate the vascular anatomy. RADIATION DOSE REDUCTION: This exam was performed according to the departmental dose-optimization program which includes automated exposure control, adjustment of the mA and/or kV according to patient size and/or use of iterative reconstruction technique. CONTRAST:  75mL OMNIPAQUE  IOHEXOL  350 MG/ML SOLN COMPARISON:  Chest radiographs  05/04/2024 and earlier. CTA chest 03/14/2024. FINDINGS: Cardiovascular: Good contrast bolus timing in the pulmonary arterial tree. No pulmonary artery filling defect. Enhancing right lower lobe atelectasis or consolidation, see details below. Advanced Calcified aortic atherosclerosis. Calcified coronary artery atherosclerosis. Heart size remains normal. No pericardial effusion. Mediastinum/Nodes: Hilar and mediastinal lymph nodes are stable and within normal limits. Lungs/Pleura: Chronic emphysema. Stable lung volume since March. Central airways remain patent. However, unresolved confluent peribronchial confluent airspace disease in the left lingula and inferior left lower lobe since that time. The pattern today is slightly different. New although similar appearing right middle lobe medial segment atelectasis versus consolidation. And likewise new and widespread right lower lobe basilar segmental airspace disease with air bronchograms. These areas are partially enhancing, although the constellation is more suspicious for infectious consolidation than atelectasis. The major airways remain patent. No cavitation. No pleural effusion. Mosaic attenuation elsewhere appears to be a combination of gas trapping and atelectasis. Upper Abdomen: More distended gallbladder now, but otherwise negative visible early enhanced liver, gallbladder, spleen, pancreas, adrenal glands, kidneys and bowel in the upper abdomen. No pneumoperitoneum or upper abdominal free fluid. Advanced Calcified aortic atherosclerosis. Musculoskeletal: Osteopenia. Bulky T7-T8 disc osteophyte complex projecting into the left spinal canal there is chronic and stable. No acute osseous abnormality identified. Review of the MIP images confirms the above findings. IMPRESSION: 1. Negative for acute pulmonary embolus. 2. Emphysema (ICD10-J43.9). Progressed since March multifocal Bilateral confluent airspace opacity in the lungs which is probably combination of  Pneumonia and atelectasis. No associated pleural effusion. No cavitation. Reactive appearing mediastinal lymph nodes. 3. Aortic Atherosclerosis (ICD10-I70.0) and coronary artery atherosclerosis. Electronically Signed   By: Arline Laity.D.  On: 05/15/2024 12:18     Labs:   Basic Metabolic Panel: Recent Labs  Lab 05/15/24 1134 05/15/24 1143 05/15/24 1621 05/16/24 0459 05/17/24 0544  NA 134* 133*  --  133* 136  K 3.5 3.6  --  4.7 4.7  CL 96* 96*  --  98 102  CO2 27  --   --  27 24  GLUCOSE 113* 108*  --  134* 126*  BUN 13 11  --  16 21  CREATININE 0.85 0.90  --  0.78 0.70  CALCIUM 9.5  --   --  9.4 9.5  MG  --   --  2.0  --   --   PHOS  --   --  3.2  --   --    GFR Estimated Creatinine Clearance: 58.1 mL/min (by C-G formula based on SCr of 0.7 mg/dL). Liver Function Tests: Recent Labs  Lab 05/15/24 1134 05/16/24 0459  AST 13* 13*  ALT 10 11  ALKPHOS 55 43  BILITOT 0.5 0.4  PROT 6.3* 5.2*  ALBUMIN 3.3* 2.8*   No results for input(s): "LIPASE", "AMYLASE" in the last 168 hours. No results for input(s): "AMMONIA" in the last 168 hours. Coagulation profile No results for input(s): "INR", "PROTIME" in the last 168 hours.  CBC: Recent Labs  Lab 05/15/24 1134 05/15/24 1143 05/16/24 0459 05/16/24 0706 05/17/24 0544  WBC 13.9*  --  10.0 DUPL @0712  KAM 13.6*  NEUTROABS  --   --  8.9* PENDING 12.3*  HGB 10.7* 12.2 9.3* DUPL @0712  KAM 9.9*  HCT 35.0* 36.0 32.0* DUPL @0712  KAM 34.2*  MCV 86.8  --  91.7 DUPL @0712  KAM 93.2  PLT 356  --  304 DUPL @0712  KAM 304   Cardiac Enzymes: No results for input(s): "CKTOTAL", "CKMB", "CKMBINDEX", "TROPONINI" in the last 168 hours. BNP: Invalid input(s): "POCBNP" CBG: No results for input(s): "GLUCAP" in the last 168 hours. D-Dimer No results for input(s): "DDIMER" in the last 72 hours. Hgb A1c No results for input(s): "HGBA1C" in the last 72 hours. Lipid Profile No results for input(s): "CHOL", "HDL", "LDLCALC", "TRIG",  "CHOLHDL", "LDLDIRECT" in the last 72 hours. Thyroid function studies No results for input(s): "TSH", "T4TOTAL", "T3FREE", "THYROIDAB" in the last 72 hours.  Invalid input(s): "FREET3" Anemia work up Recent Labs    05/17/24 0544  VITAMINB12 161*  FOLATE 10.6  FERRITIN 17  TIBC 406  IRON  17*  RETICCTPCT 2.7   Microbiology Recent Results (from the past 240 hours)  Resp panel by RT-PCR (RSV, Flu A&B, Covid) Anterior Nasal Swab     Status: None   Collection Time: 05/15/24 11:34 AM   Specimen: Anterior Nasal Swab  Result Value Ref Range Status   SARS Coronavirus 2 by RT PCR NEGATIVE NEGATIVE Final    Comment: (NOTE) SARS-CoV-2 target nucleic acids are NOT DETECTED.  The SARS-CoV-2 RNA is generally detectable in upper respiratory specimens during the acute phase of infection. The lowest concentration of SARS-CoV-2 viral copies this assay can detect is 138 copies/mL. A negative result does not preclude SARS-Cov-2 infection and should not be used as the sole basis for treatment or other patient management decisions. A negative result may occur with  improper specimen collection/handling, submission of specimen other than nasopharyngeal swab, presence of viral mutation(s) within the areas targeted by this assay, and inadequate number of viral copies(<138 copies/mL). A negative result must be combined with clinical observations, patient history, and epidemiological information. The expected result is Negative.  Fact Sheet for Patients:  BloggerCourse.com  Fact Sheet for Healthcare Providers:  SeriousBroker.it  This test is no t yet approved or cleared by the [place]  FDA and  has been authorized for detection and/or diagnosis of SARS-CoV-2 by FDA under an Emergency Use Authorization (EUA). This EUA will remain  in effect (meaning this test can be used) for the duration of the COVID-19 declaration under Section 564(b)(1) of  the Act, 21 U.S.C.section 360bbb-3(b)(1), unless the authorization is terminated  or revoked sooner.       Influenza A by PCR NEGATIVE NEGATIVE Final   Influenza B by PCR NEGATIVE NEGATIVE Final    Comment: (NOTE) The Xpert Xpress SARS-CoV-2/FLU/RSV plus assay is intended as an aid in the diagnosis of influenza from Nasopharyngeal swab specimens and should not be used as a sole basis for treatment. Nasal washings and aspirates are unacceptable for Xpert Xpress SARS-CoV-2/FLU/RSV testing.  Fact Sheet for Patients: BloggerCourse.com  Fact Sheet for Healthcare Providers: SeriousBroker.it  This test is not yet approved or cleared by the [place]  FDA and has been authorized for detection and/or diagnosis of SARS-CoV-2 by FDA under an Emergency Use Authorization (EUA). This EUA will remain in effect (meaning this test can be used) for the duration of the COVID-19 declaration under Section 564(b)(1) of the Act, 21 U.S.C. section 360bbb-3(b)(1), unless the authorization is terminated or revoked.     Resp Syncytial Virus by PCR NEGATIVE NEGATIVE Final    Comment: (NOTE) Fact Sheet for Patients: BloggerCourse.com  Fact Sheet for Healthcare Providers: SeriousBroker.it  This test is not yet approved or cleared by the [place]  FDA and has been authorized for detection and/or diagnosis of SARS-CoV-2 by FDA under an Emergency Use Authorization (EUA). This EUA will remain in effect (meaning this test can be used) for the duration of the COVID-19 declaration under Section 564(b)(1) of the Act, 21 U.S.C. section 360bbb-3(b)(1), unless the authorization is terminated or revoked.  Performed at [facility], [address] [place], [place] [address]   Culture, blood (Routine X 2) w Reflex to ID Panel     Status: None (Preliminary result)   Collection Time:  05/15/24  4:21 PM   Specimen: BLOOD  Result Value Ref Range Status   Specimen Description   Final    BLOOD BLOOD LEFT ARM AEROBIC BOTTLE ONLY ANAEROBIC BOTTLE ONLY Performed at [facility], [address] [place], [place] [address]    Special Requests   Final    BOTTLES DRAWN AEROBIC AND ANAEROBIC Blood Culture results may not be optimal due to an inadequate volume of blood received in culture bottles Performed at [facility], [address] [place], [place] [address]    Culture   Final    NO GROWTH 3 DAYS Performed at [facility], [address] [place], [place] [address]    Report Status PENDING  Incomplete  Culture, blood (Routine X 2) w Reflex to ID Panel     Status: None (Preliminary result)   Collection Time: 05/15/24  4:26 PM   Specimen: BLOOD  Result Value Ref Range Status   Specimen Description   Final    BLOOD BLOOD LEFT ARM AEROBIC BOTTLE ONLY ANAEROBIC BOTTLE ONLY Performed at [facility], [address], [place], [place] [address]    Special Requests   Final    BOTTLES DRAWN AEROBIC AND ANAEROBIC Blood Culture results may not be optimal due to an inadequate volume  of blood received in culture bottles Performed at [facility], [address], [place], [place] [address]    Culture   Final    NO GROWTH 3 DAYS Performed at [facility], [address], [place], [place] [address]    Report Status PENDING  Incomplete  Respiratory (~20 pathogens) panel by PCR     Status: None   Collection Time: 05/15/24  5:01 PM   Specimen: Nasopharyngeal Swab; Respiratory  Result Value Ref Range Status   Adenovirus NOT DETECTED NOT DETECTED Final   Coronavirus 229E NOT DETECTED NOT DETECTED Final    Comment: (NOTE) The Coronavirus on the Respiratory Panel, DOES NOT test for the novel  Coronavirus (2019 nCoV)    Coronavirus HKU1 NOT DETECTED NOT DETECTED Final   Coronavirus NL63 NOT  DETECTED NOT DETECTED Final   Coronavirus OC43 NOT DETECTED NOT DETECTED Final   Metapneumovirus NOT DETECTED NOT DETECTED Final   Rhinovirus / Enterovirus NOT DETECTED NOT DETECTED Final   Influenza A NOT DETECTED NOT DETECTED Final   Influenza B NOT DETECTED NOT DETECTED Final   Parainfluenza Virus 1 NOT DETECTED NOT DETECTED Final   Parainfluenza Virus 2 NOT DETECTED NOT DETECTED Final   Parainfluenza Virus 3 NOT DETECTED NOT DETECTED Final   Parainfluenza Virus 4 NOT DETECTED NOT DETECTED Final   Respiratory Syncytial Virus NOT DETECTED NOT DETECTED Final   Bordetella pertussis NOT DETECTED NOT DETECTED Final   Bordetella Parapertussis NOT DETECTED NOT DETECTED Final   Chlamydophila pneumoniae NOT DETECTED NOT DETECTED Final   Mycoplasma pneumoniae NOT DETECTED NOT DETECTED Final    Comment: Performed at [facility], [address] [place], [place] [address]  Acid Fast Smear (AFB)     Status: None   Collection Time: 05/16/24  1:56 PM   Specimen: Bronchial Alveolar Lavage; Respiratory  Result Value Ref Range Status   AFB Specimen Processing Concentration  Final   Acid Fast Smear Negative  Final    Comment: (NOTE) Performed At: [facility] [address] [place], [place] [id number] Pearlean Botts MD OZ:[id number]    Source (AFB) BRONCHIAL ALVEOLAR LAVAGE  Final    Comment: Performed at [facility], [address], [place], [place] [address]  Culture, BAL-quantitative w Gram Stain     Status: None (Preliminary result)   Collection Time: 05/16/24  2:01 PM   Specimen: Bronchial Alveolar Lavage; Respiratory  Result Value Ref Range Status   Specimen Description   Final    BRONCHIAL ALVEOLAR LAVAGE Performed at [facility], [address] [place], [place] [address]    Special Requests RML  Final   Gram Stain NO WBC SEEN NO ORGANISMS SEEN   Final   Culture   Final    NO GROWTH 2 DAYS Performed at [facility],  [address] [place] [address], [place], [place] [address]    Report Status PENDING  Incomplete  Acid Fast Smear (AFB)     Status: None   Collection Time: 05/16/24  2:01 PM   Specimen: Bronchial Alveolar Lavage; Respiratory  Result Value Ref Range Status   AFB Specimen Processing Concentration  Final   Acid Fast Smear Negative  Final    Comment: (NOTE) Performed At: [facility] [address] [place], [place] [id number] Pearlean Botts MD IH:[id number]    Source (AFB) BRONCHIAL ALVEOLAR LAVAGE  Final    Comment: LINGULA Performed at [facility], [address] [place], [place]  [address]   Anaerobic culture w Gram Stain     Status: None (Preliminary result)   Collection Time: 05/16/24  2:01 PM   Specimen: Bronchoalveolar Lavage  Result Value Ref Range Status   Specimen Description BRONCHIAL ALVEOLAR LAVAGE  Final   Special Requests RML  Final   Gram Stain   Final    NO WBC SEEN NO ORGANISMS SEEN Performed at [facility], [address], [place], [place] [address]    Culture PENDING  Incomplete   Report Status PENDING  Incomplete  Culture, BAL-quantitative w Gram Stain     Status: None (Preliminary result)   Collection Time: 05/16/24  3:36 PM   Specimen: Bronchoalveolar Lavage  Result Value Ref Range Status   Specimen Description BRONCHIAL ALVEOLAR LAVAGE  Final   Special Requests LINGULA SAM[PLE A  Final   Gram Stain NO WBC SEEN NO ORGANISMS SEEN   Final   Culture   Final    NO GROWTH 2 DAYS Performed at [facility], [address], [place], [place] [address]    Report Status PENDING  Incomplete  Anaerobic culture w Gram Stain     Status: None (Preliminary result)   Collection Time: 05/16/24  3:36 PM   Specimen: Bronchoalveolar Lavage  Result Value Ref Range Status   Specimen Description BRONCHIAL ALVEOLAR LAVAGE  Final   Special Requests LINGULA SAMPLE A  Final   Gram Stain   Final    NO WBC SEEN NO ORGANISMS SEEN Performed at [facility], [address], [place], [place] [address]    Culture PENDING  Incomplete   Report Status PENDING  Incomplete    Time coordinating discharge: 45 minutes  Signed: Raziel Koenigs  [facility] 05/18/2024, 1:39 PM

## 2024-05-18 NOTE — Progress Notes (Signed)
 CPT AND MUCO Q4 ON HOLD DUE TO PT NOT ON THE FLOOR.

## 2024-05-18 NOTE — Progress Notes (Signed)
 05/18/2024 Seen in f/u for abnormal imaging.  S No change in symptoms with vest and hypertonic. Still cough with inability to expectorate thick phlegm.  O Stable mild wheezing, nonlabored breathing, ext warm  A: Ongoing severe symptoms related to mucus plugging of bronchi.  Character of secretions are so thick that expectoration is nearly impossible (was very difficult to remove with bronch).  No difference with hypertonic/CPT.  Some mild abnormalities on MBSS including possible mild narrowing of esophagus.  P: Esophagram if able this am 1x dose mucomyst, if helps can try to get this for home Home meds would switch trelegy to breo, switch duonebs to albuterol  neb PRN, prednisone  40/day x 4 more days then stop Will have office f/u in 1-2 weeks, she will bring disability paperwork there and I can fill them out for her given ongoing severity of symptoms Will send info to Dr. Diania Fortes; tough case and if cultures are negative don't have great idea on how to fix long term  Ardelle Kos MD PCC[facility]

## 2024-05-18 NOTE — Telephone Encounter (Signed)
 Dr. Felipe Horton, I will call to set up FU appt. We can complete forms if you want to fax them to [phone]. Otherwise will adv PT to bring them in @ appt. TY

## 2024-05-19 LAB — CULTURE, BAL-QUANTITATIVE W GRAM STAIN
Culture: NO GROWTH
Culture: NO GROWTH
Gram Stain: NONE SEEN
Gram Stain: NONE SEEN

## 2024-05-20 ENCOUNTER — Ambulatory Visit: Payer: Self-pay | Admitting: Pulmonary Disease

## 2024-05-20 LAB — CULTURE, BLOOD (ROUTINE X 2)
Culture: NO GROWTH
Culture: NO GROWTH

## 2024-05-20 MED ORDER — FUROSEMIDE 20 MG PO TABS: 20.0000 mg | ORAL_TABLET | Freq: Every day | ORAL | 11 refills | Status: DC

## 2024-05-20 NOTE — Addendum Note (Signed)
 Addended byPhyllis Breeze on: 05/20/2024 12:13 PM   Modules accepted: Orders

## 2024-05-20 NOTE — Telephone Encounter (Signed)
Dr. Mannam, please advise. 

## 2024-05-20 NOTE — Telephone Encounter (Signed)
 I called and spoke with the pt and notified of response from Dr Waylan Haggard. Pt verbalized understanding. Nothing further needed.

## 2024-05-20 NOTE — Telephone Encounter (Signed)
 Patient's chart reviewed Recent creatinine on 5/20 was normal at 0.70 Prescription for Lasix  20 mg/day has been sent to her pharmacy Keep scheduled follow-up appointment on 6/16  If conditions worsen then she will need to go to ED or urgent care for evaluation.  Leslieanne Cobarrubias MD [facility] 05/20/2024, 12:12 PM

## 2024-05-20 NOTE — Telephone Encounter (Signed)
 Chief Complaint: BLE swelling Symptoms: mod-severe swelling Frequency: yesterday Pertinent Negatives: Patient denies fever, SOB, CP, weeping Disposition: [] ED /[x] Urgent Care (no appt availability in office) / [] Appointment(In office/virtual)/ []  [facility]/ [] Home Care/ [] Refused Recommended Disposition /[] [facility]/ [x]  Follow-up with Pulm Additional Notes: Pt c/o of bilateral leg swelling up to knees. Pt reports recent hospitalization for PNA and was discharged with prednisone  with 3 days left. Pt believes BLE swelling is r/t prednisone  and is requesting Lasix . [facility] attempted to schedule with [facility], but no access. Of note, [facility] appt already scheduled. [facility] will forward encounter for Dr. Diania Fortes 's office to review and advise. Patient verbalized understanding and is expecting call back from office for next steps. [facility] also advised that if pt does not hear back from office, to follow disposition for further evaluation/treatment.    Copied from CRM [id number]. Topic: Clinical - Red Word Triage >> May 20, 2024 10:16 AM Justina Oman C wrote: Red Word that prompted transfer to Nurse Triage: Patient [phone] states need Lasix  been on Prednisome, legs and feet are swollen, painful, shortness of breath, and wheeezing. Patient denies fever, redness in legs, nor dizziness. Please advise.   [facility] [address] [place] - [address] [place] [address] Phone: [phone] Fax: [phone] Reason for Disposition  SEVERE leg swelling (e.g., swelling extends above knee, entire leg is swollen, weeping fluid)  Answer Assessment - Initial Assessment Questions E2C2 Pulmonary Triage - Initial Assessment Questions "Chief Complaint (e.g., cough, sob, wheezing, fever, chills, sweat or additional symptoms) *Go to specific symptom protocol after initial questions. BLE swelling - pt thinks r/t prednisone  Pt reports recently hospitalized and continued on medication  post D/C and has 3 days left  Pt hospitalized for PNA  "How long have symptoms been present?" yesterday    MEDICINES:    "Have you used your inhalers/maintenance medication?" Yes If yes, "What medications?" Breo albuterol   If inhaler, ask "How many puffs and how often?" Note: Review instructions on medication in the chart. As prescribed  OXYGEN : "Do you wear supplemental oxygen ?" Yes If yes, "How many liters are you supposed to use?" 3L  "Do you monitor your oxygen  levels?" Yes If yes, "What is your reading (oxygen  level) today?" 95  "What is your usual oxygen  saturation reading?"  (Note: Pulmonary O2 sats should be 90% or greater) 95-96    2. LOCATION: "What part of the leg is swollen?"  "Are both legs swollen or just one leg?"     Both legs 3. SEVERITY: "How bad is the swelling?" (e.g., localized; mild, moderate, severe)   - Localized: Small area of swelling localized to one leg.   - MILD pedal edema: Swelling limited to foot and ankle, pitting edema < 1/4 inch (6 mm) deep, rest and elevation eliminate most or all swelling.   - MODERATE edema: Swelling of lower leg to knee, pitting edema > 1/4 inch (6 mm) deep, rest and elevation only partially reduce swelling.   - SEVERE edema: Swelling extends above knee, facial or hand swelling present.      Moderate- severe From knees down to feet 4. REDNESS: "Does the swelling look red or infected?"     denies 5. PAIN: "Is the swelling painful to touch?" If Yes, ask: "How painful is it?"   (Scale 1-10; mild, moderate or severe)     yes 6. FEVER: "Do you have a fever?" If Yes, ask: "What is it, how was it measured, and when did it start?"  denies 7. CAUSE: "What do you think is causing the leg swelling?"     Possibly r/t prednisone  8. MEDICAL HISTORY: "Do you have a history of blood clots (e.g., DVT), cancer, heart failure, kidney disease, or liver failure?"     Pt denies, per chart CHF 9. RECURRENT SYMPTOM: "Have you had  leg swelling before?" If Yes, ask: "When was the last time?" "What happened that time?"     First time 10. OTHER SYMPTOMS: "Do you have any other symptoms?" (e.g., chest pain, difficulty breathing)       denies  Protocols used: Leg Swelling and Edema-A-AH

## 2024-05-21 LAB — ANAEROBIC CULTURE W GRAM STAIN: Gram Stain: NONE SEEN

## 2024-05-22 LAB — ANAEROBIC CULTURE W GRAM STAIN: Gram Stain: NONE SEEN

## 2024-05-25 ENCOUNTER — Telehealth: Payer: Self-pay | Admitting: Pulmonary Disease

## 2024-05-25 NOTE — Telephone Encounter (Signed)
 Waiting on [employer] Disability ppwk to be fax'd ftrom Danielle at this PT's office. I have left 2 VM for her. PT states Diego Foy has sent it over. Adv PT will call her to keep her updated.

## 2024-05-26 ENCOUNTER — Ambulatory Visit: Admitting: Adult Health

## 2024-05-31 NOTE — Telephone Encounter (Signed)
 patient is calling to speak with someone about her fmla paperwork . Called the cal line and spoke with someone and  The paperwork has not come in yet . But they can send it to my [email or web address] Relayed information to patient and she said thank you

## 2024-05-31 NOTE — Telephone Encounter (Signed)
 Finally rec'd ppwk via [employer]. Will complete what I can and fwd to Dr. Diania Fortes for signature. He is expected back in 3 weeks due to hospital duty. Will copy and put in accordion file.

## 2024-05-31 NOTE — Telephone Encounter (Signed)
 They are calling again. Still no disability ppwk rec'd. Sent a fax asking them to fax back to [facility] fax machine.

## 2024-06-08 NOTE — Telephone Encounter (Signed)
 Copied from CRM [id number]. Topic: General - Other >> Jun 08, 2024  9:46 AM Alverda Joe S wrote: Reason for CRM: patients employer is calling in regards to her fmla paperwork. Camilo Cella says patient really needs her paperwork to be filled out as soon as possible because she has no support and she need this document filled in order to get support. Hr jessica from [employer] would like to speak to someone who can get this completed sooner than 3 weeks. Please call at [phone] >> Jun 08, 2024 10:22 AM Ilene Malling wrote: Patient [phone] is returning the office call regarding FMLA and disability claim. Unable to contact CAL. Please advise and call back.   Spoke with Camilo Cella from HR regarding patient's STD claim . Coretha Dew that our office does have a turn around time for FMLA paperwork to be filled out by the provider 10-14 business days. And sent back . Camilo Cella was trying to see if another provider can fill out the forms for patient since she has no other income and was waiting on STD that she has put 8 years into . Coretha Dew that once Dr.Dewald finishes the paperwork it will be sent back to patient's STD . Jessica's voice was understanding. Attached [employer] [place].

## 2024-06-08 NOTE — Telephone Encounter (Signed)
ATC x1 LVM for patient to call our office back regarding prior message.  

## 2024-06-08 NOTE — Telephone Encounter (Signed)
 PT's emp calling again and tells E2C2 she really needs ppwk signed and returned. I was told Dr. Diania Fortes has the paperwork with him and will be out on hospital duty until Monday, June 16th. No other Dr. Curlene Dotter sign.

## 2024-06-13 ENCOUNTER — Ambulatory Visit (INDEPENDENT_AMBULATORY_CARE_PROVIDER_SITE_OTHER): Admitting: Pulmonary Disease

## 2024-06-13 ENCOUNTER — Encounter: Payer: Self-pay | Admitting: Pulmonary Disease

## 2024-06-13 VITALS — BP 124/68 | HR 94 | Ht 64.5 in | Wt 123.0 lb

## 2024-06-13 DIAGNOSIS — J4489 Other specified chronic obstructive pulmonary disease: Secondary | ICD-10-CM

## 2024-06-13 DIAGNOSIS — Z9981 Dependence on supplemental oxygen: Secondary | ICD-10-CM | POA: Diagnosis not present

## 2024-06-13 DIAGNOSIS — R0982 Postnasal drip: Secondary | ICD-10-CM

## 2024-06-13 LAB — FUNGUS CULTURE WITH STAIN

## 2024-06-13 LAB — FUNGAL ORGANISM REFLEX

## 2024-06-13 LAB — FUNGUS CULTURE RESULT

## 2024-06-13 MED ORDER — TRELEGY ELLIPTA 200-62.5-25 MCG/ACT IN AEPB: 1.0000 | INHALATION_SPRAY | Freq: Every day | RESPIRATORY_TRACT | 11 refills | Status: DC

## 2024-06-13 MED ORDER — SODIUM CHLORIDE 3 % IN NEBU: INHALATION_SOLUTION | RESPIRATORY_TRACT | 12 refills | Status: AC | PRN

## 2024-06-13 MED ORDER — ROFLUMILAST 500 MCG PO TABS: 500.0000 ug | ORAL_TABLET | Freq: Every day | ORAL | 11 refills | Status: DC

## 2024-06-13 MED ORDER — FLUTICASONE PROPIONATE 50 MCG/ACT NA SUSP: 1.0000 | Freq: Every evening | NASAL | 11 refills | Status: AC

## 2024-06-13 NOTE — Progress Notes (Signed)
 Synopsis: Referred in February 2023 for wheezing by Andriette Null, NP  Subjective:   PATIENT ID: Breanna Ramirez GENDER: female DOB: 08/26/1956, MRN: [id number]  HPI  Chief Complaint  Patient presents with   Follow-up   Breanna Ramirez is a 68 year old woman, former smoker with COPD and GERD who returns to pulmonary clinic for COPD.   She was admitted 5/18 to 5/21 for on going respiratory symptoms. She had bronchoscopy done 5/19 with significant mucous noted. She had trouble expectorating mucous. Anti-muscarinic medications were stopped to help thin secretions.  BAL cultures remain negative to date. Cytology is negative.  She experiences persistent thick mucus production despite recent hospitalization and treatment changes. She continues to have difficulty clearing secretions.   She uses a nebulizer and inhaler, recently switching from [facility] to Trelegy, which she finds more effective. Currently, she uses Ohtuvayre  nebulizers with slight improvement. Chest physical therapy, including a chest vest and high salt solution nebulizer treatment, did not yield significant results. She has hypertonic saline at home but has not been using it.  She started using oxygen  after recent hospitalizations and is concerned about her oxygen  dependency and physical limitations.  She has difficulty lifting more than 10lbs. 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   OV 05/04/24 She has persistent wheezing and a non-productive cough since her hospitalization on March 14, 2024. Initially, she experienced pain in the lower right chest, now located in the upper right chest with associated wheezing. Despite treatment with amoxicillin , azithromycin , prednisone , and Augmentin , symptoms have only slightly improved. Ohtuvayre  was prescribed but not yet approved by insurance.  She experiences night sweats, which  began before her hospitalization and continue. She denies fever, chills, or difficulty swallowing. No joint pains or muscle aches.  Her current medications include Trelegy, one puff daily, and nebulizer treatments twice a day. She uses a Flutter device regularly and has one remaining prednisone  pill.  She quit smoking in 2019 and occasionally smells smoke from neighbors in her apartment. No current smoking, vaping, or exposure to secondhand smoke within her home. She has no pets and does not use down pillows or comforters.   OV 08/25/22 ONO since last visit showed she qualified for night time oxygen . She is considering formal sleep study when she has more time away from work.  She was treated with levaquin  for 5 days and then augmentin  for 10 days in July by her nurse practitioner at work for on going cough.   She reports her breathing is well controlled at this time but continues to have some wheezing.   She has run out of montelukast . She has significant post nasal drainage. GERD is well controlled at this time.   OV 06/02/22 She was admitted 5/21 to 5/24 for COPD exacerbation. She was discharged with supplemental oxygen  3L. Using at night mainly.   She continues to have cough with some sputum production. She has wheezing but it is much improved. She is not quite back to baseline yet. She is concerned about returning to work where it is very hot and humid in [employer]. She is concerned it will aggravate her breathing again.   OV 05/12/22 She was treated for COPD exacerbation at last visit with prednisone  taper with Zpak. She was started on singulair . She continues on symbicort 2 puffs twice dialy and as needed duoneb treatments. She reports  relief from the prednisone  taper but of recent she has increased cough, sputum production and wheezing.   PFTs show moderate restriction and diffusion defect. Flow volume loop does appear obstructed.   OV 02/12/22 She reports being treated in September  2022 for pneumonia and later developed COVID-19 infection in late October 2022 in which she was treated with Paxlovid.  She again was sick with pneumonia in December 2022 and responded well to antibiotics, steroids and using Symbicort inhaler.  She tapered herself off the Symbicort inhaler in January after she was feeling better.  She started to have wheezing towards the end of January along with shortness of breath.  She denies any cough or sputum production.  She reports that she notices the wheezing mostly at nighttime when going to bed.  She quit smoking in 2019.  She has a 40+ pack year smoking history.  She has significant secondhand smoke exposure in childhood.  She currently works as a Solicitor at a [employer]  She denies any significant dust or chemical exposures.  She does have significant reflux and seasonal allergies.  She reports springtime is the most difficult season for her due to the pollen.  She denies any increased cough or dysphagia when eating or drinking.  She denies the sensation of food getting stuck in her throat or chest.  Her father had asthma and her maternal grandfather had asthma.  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   Past Medical History:  Diagnosis Date   COPD (chronic obstructive pulmonary disease) (HCC)    Essential hypertension    GERD (gastroesophageal reflux disease)    Pneumonia 05/2017   Respiratory failure with hypoxia (HCC) 05/2017     Family History  Problem Relation Age of Onset   Cancer Father      Social History   Socioeconomic History   Marital status: Divorced    Spouse name: Not on file   Number of children: 1   Years of education: Not on file   Highest education level: Not on file  Occupational History   Occupation: Works in Designer, fashion/clothing.  Tobacco Use   Smoking status: Former    Current packs/day: 0.00    Average packs/day: 1 pack/day for 45.0 years (45.0 ttl pk-yrs)     Types: Cigarettes    Start date: 12/29/1972    Quit date: 12/29/2017    Years since quitting: 6.4   Smokeless tobacco: Never  Vaping Use   Vaping status: Never Used  Substance and Sexual Activity   Alcohol use: No   Drug use: No   Sexual activity: Never    Birth control/protection: Post-menopausal  Other Topics Concern   Not on file  Social History Narrative   Not on file   Social Drivers of Health   Financial Resource Strain: Not on file  Food Insecurity: No Food Insecurity (05/15/2024)   Hunger Vital Sign    Worried About Running Out of Food in the Last Year: Never true    Ran Out of Food in the Last Year: Never true  Transportation Needs: No Transportation Needs (05/16/2024)   PRAPARE - Administrator, Civil Service (Medical): No    Lack of Transportation (Non-Medical): No  Physical Activity: Not on file  Stress: Not on file  Social Connections: Unknown (05/15/2024)   Social Connection and Isolation Panel    Frequency of Communication with Friends and Family: Twice a week  Frequency of Social Gatherings with Friends and Family: Once a week    Attends Religious Services: 1 to 4 times per year    Active Member of [employer] or Organizations: No    Attends Banker Meetings: 1 to 4 times per year    Marital Status: Patient declined  Intimate Partner Violence: Not At Risk (05/15/2024)   Humiliation, Afraid, Rape, and Kick questionnaire    Fear of Current or Ex-Partner: No    Emotionally Abused: No    Physically Abused: No    Sexually Abused: No     Allergies  Allergen Reactions   Codeine Nausea And Vomiting     Outpatient Medications Prior to Visit  Medication Sig Dispense Refill   albuterol  (VENTOLIN  HFA) 108 (90 Base) MCG/ACT inhaler Inhale 2 puffs into the lungs every 6 (six) hours as needed for wheezing or shortness of breath. 6.7 g 0   Aspirin -Salicylamide-Caffeine (BC HEADACHE POWDER PO) Take 1 packet by mouth as needed.     cholecalciferol  (VITAMIN D3) 25 MCG (1000 UNIT) tablet Take 1,000 Units by mouth every other day.     Ensifentrine  (OHTUVAYRE ) 3 MG/2.5ML SUSP Inhale 1 Inhalation into the lungs 2 (two) times daily. 150 mL 11   esomeprazole  (NEXIUM ) 40 MG capsule Take 1 capsule (40 mg total) by mouth daily. (Patient taking differently: Take 40 mg by mouth at bedtime.) 90 capsule 4   fluticasone  (FLONASE ) 50 MCG/ACT nasal spray Place 1 spray into both nostrils every evening.     fluticasone  furoate-vilanterol (BREO ELLIPTA ) 100-25 MCG/ACT AEPB Inhale 1 puff into the lungs daily. 60 each 0   furosemide  (LASIX ) 20 MG tablet Take 1 tablet (20 mg total) by mouth daily. 30 tablet 11   hydrochlorothiazide  (HYDRODIURIL ) 25 MG tablet Take 1 tablet (25 mg total) by mouth daily. (Patient taking differently: Take 25 mg by mouth in the morning.) 90 tablet 3   ibuprofen (ADVIL) 200 MG tablet Take 400 mg by mouth as needed for mild pain (pain score 1-3) or moderate pain (pain score 4-6).     montelukast  (SINGULAIR ) 10 MG tablet Take 1 tablet (10 mg total) by mouth at bedtime. 30 tablet 11   simvastatin  (ZOCOR ) 40 MG tablet Take 1 tablet (40 mg total) by mouth at bedtime. 90 tablet 3   No facility-administered medications prior to visit.   Review of Systems  Constitutional:  Negative for chills, fever, malaise/fatigue and weight loss.  HENT:  Positive for congestion. Negative for sinus pain and sore throat.   Eyes: Negative.   Respiratory:  Positive for cough, sputum production, shortness of breath and wheezing. Negative for hemoptysis.   Cardiovascular:  Negative for chest pain, palpitations, orthopnea, claudication and leg swelling.  Gastrointestinal:  Negative for abdominal pain, heartburn, nausea and vomiting.  Genitourinary: Negative.   Musculoskeletal:  Negative for joint pain and myalgias.  Skin:  Negative for rash.  Neurological:  Negative for weakness and headaches.  Endo/Heme/Allergies: Negative.   Psychiatric/Behavioral:  Negative.      Objective:   Vitals:   06/13/24 0901  BP: 124/68  Pulse: 94  SpO2: 93%  Weight: 123 lb (55.8 kg)  Height: 5' 4.5 (1.638 m)    Physical Exam Constitutional:      General: She is not in acute distress.    Appearance: She is not ill-appearing.  HENT:     Head: Normocephalic and atraumatic.   Eyes:     General: No scleral icterus.    Conjunctiva/sclera: Conjunctivae  normal.    Cardiovascular:     Rate and Rhythm: Normal rate and regular rhythm.     Pulses: Normal pulses.     Heart sounds: Normal heart sounds. No murmur heard. Pulmonary:     Effort: Pulmonary effort is normal.     Breath sounds: No wheezing, rhonchi or rales.   Musculoskeletal:     Right lower leg: No edema.     Left lower leg: No edema.   Skin:    General: Skin is warm and dry.   Neurological:     Mental Status: She is alert.    CBC    Component Value Date/Time   WBC 13.6 (H) 05/17/2024 0544   RBC 3.69 (L) 05/17/2024 0544   RBC 3.67 (L) 05/17/2024 0544   HGB 9.9 (L) 05/17/2024 0544   HCT 34.2 (L) 05/17/2024 0544   PLT 304 05/17/2024 0544   MCV 93.2 05/17/2024 0544   MCH 27.0 05/17/2024 0544   MCHC 28.9 (L) 05/17/2024 0544   RDW 16.9 (H) 05/17/2024 0544   LYMPHSABS 0.5 (L) 05/17/2024 0544   MONOABS 0.6 05/17/2024 0544   EOSABS 0.0 05/17/2024 0544   BASOSABS 0.0 05/17/2024 0544      Latest Ref Rng & Units 05/17/2024    5:44 AM 05/16/2024    4:59 AM 05/15/2024   11:43 AM  BMP  Glucose 70 - 99 mg/dL 873  865  891   BUN 8 - 23 mg/dL 21  16  11    Creatinine 0.44 - 1.00 mg/dL 9.29  9.21  9.09   Sodium 135 - 145 mmol/L 136  133  133   Potassium 3.5 - 5.1 mmol/L 4.7  4.7  3.6   Chloride 98 - 111 mmol/L 102  98  96   CO2 22 - 32 mmol/L 24  27    Calcium 8.9 - 10.3 mg/dL 9.5  9.4     Chest imaging: CXR 05/18/22 Cardiomediastinal silhouette is stable. No pneumothorax. Blunting of the costophrenic angles unchanged since September 25, 2020. Emphysematous changes identified  in the lungs. Interstitial prominence is similar in the interval. Chronic opacity in the lingula and right middle lobe was noted to represent atelectasis on the October 31, 2021 CT scan. The opacity in the right base is similar in appearance compared to September 25, 2020.  CTA Chest PE 10/31/21 1.  No evidence of pulmonary embolism. 2. Low lung volumes with chronic bibasilar volume loss and areas of atelectasis/scarring. Mosaic attenuation is indicative of air trapping. 3. Debris in the esophagus, suggesting dysmotility or gastroesophageal reflux. 4. Age advanced coronary artery atherosclerosis. Recommend assessment of coronary risk factors and consideration of medical therapy. 5. Aortic atherosclerosis  PFT:    Latest Ref Rng & Units 05/12/2022   10:47 AM  PFT Results  FVC-Pre L 1.67   FVC-Predicted Pre % 51   FVC-Post L 1.72   FVC-Predicted Post % 52   Pre FEV1/FVC % % 77   Post FEV1/FCV % % 79   FEV1-Pre L 1.28   FEV1-Predicted Pre % 51   FEV1-Post L 1.36   DLCO uncorrected ml/min/mmHg 8.38   DLCO UNC% % 41   DLCO corrected ml/min/mmHg 8.38   DLCO COR %Predicted % 41   DLVA Predicted % 56   TLC L 4.06   TLC % Predicted % 78   RV % Predicted % 104     Labs:  Path:  Echo:  Heart Catheterization:   MBS 05/17/24 Clinical Impression: Clinical Impression:  Patient presents with a mild oropharyngeal dysphagia with an esophageal component as well. Anterior hyoid excursion was partial in completion and although epiglottic inversion and laryngeal vestibule closure appeared complete, they were delayed, resulting in instances of penetration above the vocal cords which cleared laryngeal vestibule (PAS 2) with thin liquids. PES opening was partial in duration and distention, resulting in diffuse pharyngeal residuals with solids and liquids. Dry swallow helped to clear pharyngeal residuals, though patient did not exhibit sensation to any pharyngeal residuals. 13mm barium tablet  transited through pharynx, PES and esophagus without difficulty. During esophageal sweep, barium stasis, questionable narrowing at portion of distal esophagus with retrograde movement that stayed within the esophagus  Assessment & Plan:   No diagnosis found.  Discussion: Breanna Ramirez is a 67 year old woman, former smoker with COPD and GERD who returns to pulmonary clinic for COPD.   Chronic Obstructive Pulmonary Disease (COPD) with chronic bronchitis Persistent thick mucus production despite previous treatments. Considering hypertonic saline nebulizer and roflumilast for mucus reduction. Not interested in pulmonary rehab. - Start roflumilast, one tablet daily in the morning. - Prescribe hypertonic saline nebulizer, to be used morning and evening. - Instruct to use flutter valve after hypertonic saline nebulizer. - Discontinue Breo and switch back to Trelegy. - Consider azithromycin  daily or 3 times per week if needed. - Send prescription for hypertonic saline to [facility].  Oxygen  dependence Oxygen  required post-hospitalization. Concerns about work capability due to oxygen  needs. Office work feasible with Insurance underwriter. Limited with exertional work activity. - Discuss potential for modified work duties with employer. - Consider long-term disability if unable to modify work duties.  Adverse reaction to prednisone  Severe leg and foot swelling after prednisone  use. Previous facial and eye swelling. Reluctant to use steroids again.  Goals of Care Discussed COPD progression and work impact. Prefers to work but considering early retirement or disability due to limitations. - Discuss with employer about potential for modified work duties. - Consider early retirement or disability if unable to continue current work duties.  Follow-up CT scan scheduled for June 27, prefers delay for condition improvement. - Reschedule CT scan for August.   Dorn Chill, MD [facility] Office: [phone]   Current Outpatient Medications:    albuterol  (VENTOLIN  HFA) 108 (90 Base) MCG/ACT inhaler, Inhale 2 puffs into the lungs every 6 (six) hours as needed for wheezing or shortness of breath., Disp: 6.7 g, Rfl: 0   Aspirin -Salicylamide-Caffeine (BC HEADACHE POWDER PO), Take 1 packet by mouth as needed., Disp: , Rfl:    cholecalciferol (VITAMIN D3) 25 MCG (1000 UNIT) tablet, Take 1,000 Units by mouth every other day., Disp: , Rfl:    Ensifentrine  (OHTUVAYRE ) 3 MG/2.5ML SUSP, Inhale 1 Inhalation into the lungs 2 (two) times daily., Disp: 150 mL, Rfl: 11   esomeprazole  (NEXIUM ) 40 MG capsule, Take 1 capsule (40 mg total) by mouth daily. (Patient taking differently: Take 40 mg by mouth at bedtime.), Disp: 90 capsule, Rfl: 4   fluticasone  (FLONASE ) 50 MCG/ACT nasal spray, Place 1 spray into both nostrils every evening., Disp: , Rfl:    fluticasone  furoate-vilanterol (BREO ELLIPTA ) 100-25 MCG/ACT AEPB, Inhale 1 puff into the lungs daily., Disp: 60 each, Rfl: 0   furosemide  (LASIX ) 20 MG tablet, Take 1 tablet (20 mg total) by mouth daily., Disp: 30 tablet, Rfl: 11   hydrochlorothiazide  (HYDRODIURIL ) 25 MG tablet, Take 1 tablet (25 mg total) by mouth daily. (Patient taking differently: Take 25 mg by mouth in the  morning.), Disp: 90 tablet, Rfl: 3   ibuprofen (ADVIL) 200 MG tablet, Take 400 mg by mouth as needed for mild pain (pain score 1-3) or moderate pain (pain score 4-6)., Disp: , Rfl:    montelukast  (SINGULAIR ) 10 MG tablet, Take 1 tablet (10 mg total) by mouth at bedtime., Disp: 30 tablet, Rfl: 11   simvastatin  (ZOCOR ) 40 MG tablet, Take 1 tablet (40 mg total) by mouth at bedtime., Disp: 90 tablet, Rfl: 3

## 2024-06-13 NOTE — Patient Instructions (Signed)
 Stop Breo Ellipta   Start back on Trelegy ellipta  1 puff daily - rinse mouth out after each use  Continue Ohtuvayre  Nebulizer treatments twice daily  Use albuterol  inhaler or nebulizer treatmetn every 4-6 hours as needed  Airway Clearance: - Use hypertonic saline 3% nebulizer treatments twice daily - Use flutter valve after each nebulizer treatment  Start Roflumilast 500mg  daily   We will consider adding azithromycin  daily or 3 times per week if not seeing much improvement  I will complete your FMLA paperwork today

## 2024-06-14 ENCOUNTER — Encounter: Payer: Self-pay | Admitting: Pulmonary Disease

## 2024-06-14 NOTE — Telephone Encounter (Signed)
 Received an email from employer stating, received the FMLA paperwork and it states she cannot do any heavy lifting, but it does not specify what weight she needs to stay under. They are requesting a clarification of this.   Please advise.

## 2024-06-14 NOTE — Telephone Encounter (Signed)
 Toy Freund has talked to [employer] employer and they have rec'd this ppwk. Will send to scan.

## 2024-06-15 LAB — FUNGAL ORGANISM REFLEX

## 2024-06-15 LAB — FUNGUS CULTURE WITH STAIN

## 2024-06-15 LAB — FUNGUS CULTURE RESULT

## 2024-06-15 NOTE — Telephone Encounter (Signed)
 They also need information on when patient can return to work

## 2024-06-15 NOTE — Telephone Encounter (Signed)
 JD said no more then  10 lbs

## 2024-06-17 NOTE — Telephone Encounter (Signed)
 She is safe to return to work with limited responsibilities. 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  JD

## 2024-06-17 NOTE — Telephone Encounter (Signed)
 Updates faxed back.

## 2024-06-18 ENCOUNTER — Encounter (HOSPITAL_COMMUNITY): Payer: Self-pay

## 2024-06-18 ENCOUNTER — Emergency Department (HOSPITAL_COMMUNITY)
Admission: EM | Admit: 2024-06-18 | Discharge: 2024-06-19 | Disposition: A | Discharge status: Home / Self Care | Attending: Emergency Medicine | Admitting: Emergency Medicine

## 2024-06-18 ENCOUNTER — Emergency Department (HOSPITAL_COMMUNITY)

## 2024-06-18 ENCOUNTER — Other Ambulatory Visit: Payer: Self-pay

## 2024-06-18 DIAGNOSIS — E876 Hypokalemia: Secondary | ICD-10-CM | POA: Insufficient documentation

## 2024-06-18 DIAGNOSIS — R110 Nausea: Secondary | ICD-10-CM | POA: Insufficient documentation

## 2024-06-18 DIAGNOSIS — D72829 Elevated white blood cell count, unspecified: Secondary | ICD-10-CM | POA: Insufficient documentation

## 2024-06-18 DIAGNOSIS — R040 Epistaxis: Secondary | ICD-10-CM | POA: Diagnosis present

## 2024-06-18 DIAGNOSIS — Z7982 Long term (current) use of aspirin: Secondary | ICD-10-CM | POA: Insufficient documentation

## 2024-06-18 DIAGNOSIS — I10 Essential (primary) hypertension: Secondary | ICD-10-CM | POA: Diagnosis not present

## 2024-06-18 DIAGNOSIS — J449 Chronic obstructive pulmonary disease, unspecified: Secondary | ICD-10-CM | POA: Insufficient documentation

## 2024-06-18 NOTE — ED Triage Notes (Signed)
 Pt is coming from home with off and on dizziness and nausea. Medic was called out for a nosebleed, bleed stopped before medic got there. She was initially hypotensive at 102/50 sitting and then 94 palp upon standing. She was orthostatic positive. She has recently started a new medication Roflumilast which has side effects of the ones she is experiencing which all started after the medication. She has improved since medic treatment.   4mg  zofran   18g left ac

## 2024-06-18 NOTE — ED Notes (Signed)
 Portable xray at bedside.

## 2024-06-18 NOTE — ED Provider Notes (Signed)
 [facility] AT [facility] Provider Note  CSN: [id number] Arrival date & time: 06/18/24 2149  Chief Complaint(s) Emesis  HPI Breanna Ramirez is a 68 y.o. female with h/o COPD on 3LNC started on Dailresp recently here for nausea and nosebleeds.  Patient reports that the nausea has been an ongoing issue since she recently started the new medication.  She denies any emesis.  She does report decreased appetite though.  Denies any abdominal pain.  No diarrhea.    She called EMS this evening due to the epistaxis which occurred twice today.  The second time she had difficulty controlling her blood.  It did resolve in a row.  Patient denies any anticoagulation use.  No trauma.  She does report recently being treated for multifocal pneumonia.  States that she completed her antibiotics.  Complains of a chronic cough.  No new sputum production.  The history is provided by the patient.    Past Medical History Past Medical History:  Diagnosis Date   COPD (chronic obstructive pulmonary disease) (HCC)    Essential hypertension    GERD (gastroesophageal reflux disease)    Pneumonia 05/2017   Respiratory failure with hypoxia (HCC) 05/2017   Patient Active Problem List   Diagnosis Date Noted   Acute on chronic respiratory failure with hypoxia (HCC) 05/15/2024   Multifocal pneumonia 04/26/2024   Acute and chronic respiratory failure with hypoxia (HCC) 03/14/2024   COPD (chronic obstructive pulmonary disease) (HCC) 03/14/2024   Dyspnea on exertion 09/24/2023   Chronic diastolic CHF (congestive heart failure) (HCC) 09/24/2023   Acute on chronic hypoxic respiratory failure (HCC) 09/21/2023   Hyperlipidemia 05/18/2022   Acute respiratory failure with hypoxia (HCC) 10/21/2018   IDA (iron  deficiency anemia) 10/21/2018   COPD with acute exacerbation (HCC) 10/21/2018   Pneumonia 06/18/2017   Chronic hypoxic respiratory failure, on home oxygen  therapy (HCC) 06/18/2017    Essential hypertension, benign 09/13/2014   CAFL (chronic airflow limitation) (HCC) 04/26/2012   Tobacco use 04/26/2012   SINUSITIS, CHRONIC 12/14/2007   BRONCHITIS 12/14/2007   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 Authorizing Provider  albuterol  (VENTOLIN  HFA) 108 (90 Base) MCG/ACT inhaler Inhale 2 puffs into the lungs every 6 (six) hours as needed for wheezing or shortness of breath. 05/18/24   Dahal, Chapman, MD  Aspirin -Salicylamide-Caffeine (BC HEADACHE POWDER PO) Take 1 packet by mouth as needed.    [provider]  cholecalciferol (VITAMIN D3) 25 MCG (1000 UNIT) tablet Take 1,000 Units by mouth every other day.    [provider]  Ensifentrine  (OHTUVAYRE ) 3 MG/2.5ML SUSP Inhale 1 Inhalation into the lungs 2 (two) times daily. 04/26/24   Parrett, Madelin RAMAN, NP  esomeprazole  (NEXIUM ) 40 MG capsule Take 1 capsule (40 mg total) by mouth daily. Patient taking differently: Take 40 mg by mouth at bedtime. 09/13/14   Lavell Lye A, FNP  fluticasone  (FLONASE ) 50 MCG/ACT nasal spray Place 1 spray into both nostrils every evening. 06/13/24   Kara Dorn NOVAK, MD  Fluticasone -Umeclidin-Vilant (TRELEGY ELLIPTA ) 200-62.5-25 MCG/ACT AEPB Inhale 1 puff into the lungs daily. 06/13/24   Kara Dorn NOVAK, MD  furosemide  (LASIX ) 20 MG tablet Take 1 tablet (20 mg total) by mouth daily. 05/20/24 05/20/25  Mannam, Praveen, MD  hydrochlorothiazide  (HYDRODIURIL ) 25 MG tablet Take 1 tablet (25 mg total) by mouth daily. Patient taking differently: Take  25 mg by mouth in the morning. 09/13/14   Lavell Lye A, FNP  ibuprofen (ADVIL) 200 MG tablet Take 400 mg by mouth as needed for mild pain (pain score 1-3) or moderate pain (pain score 4-6).    [provider]  montelukast  (SINGULAIR ) 10 MG tablet Take 1 tablet (10 mg total) by mouth at bedtime. 08/25/22    Kara Dorn NOVAK, MD  roflumilast (DALIRESP) 500 MCG TABS tablet Take 1 tablet (500 mcg total) by mouth daily. 06/13/24   Kara Dorn NOVAK, MD  simvastatin  (ZOCOR ) 40 MG tablet Take 1 tablet (40 mg total) by mouth at bedtime. 09/15/14   Lavell Lye A, FNP  sodium chloride  HYPERTONIC 3 % nebulizer solution Take by nebulization as needed for other. 06/13/24   Kara Dorn NOVAK, MD                                                                                                                                    Allergies Codeine  Review of Systems Review of Systems As noted in HPI  Physical Exam Vital Signs  I have reviewed the triage vital signs BP 120/61   Pulse 96   Temp 98.3 F (36.8 C) (Oral)   Resp 16   SpO2 98%   Physical Exam Vitals reviewed.  Constitutional:      General: She is not in acute distress.    Appearance: She is well-developed. She is not diaphoretic.  HENT:     Head: Normocephalic and atraumatic.     Nose:     Right Nostril: No epistaxis or septal hematoma.     Left Nostril: Epistaxis (dried blood on anterior nare) present. No septal hematoma.   Eyes:     General: No scleral icterus.       Right eye: No discharge.        Left eye: No discharge.     Conjunctiva/sclera: Conjunctivae normal.     Pupils: Pupils are equal, round, and reactive to light.    Cardiovascular:     Rate and Rhythm: Normal rate and regular rhythm.     Heart sounds: No murmur heard.    No friction rub. No gallop.  Pulmonary:     Effort: Pulmonary effort is normal. No respiratory distress.     Breath sounds: Normal breath sounds. No stridor. No rales.  Abdominal:     General: There is no distension.     Palpations: Abdomen is soft.     Tenderness: There is no abdominal tenderness.   Musculoskeletal:        General: No tenderness.     Cervical back: Normal range of motion and neck supple.   Skin:    General: Skin is warm and dry.     Findings: No erythema or rash.    Neurological:     Mental Status: She is alert and oriented to person, place, and time.  ED Results and Treatments Labs (all labs ordered are listed, but only abnormal results are displayed) Labs Reviewed  COMPREHENSIVE METABOLIC PANEL WITH GFR - Abnormal; Notable for the following components:      Result Value   Potassium 3.0 (*)    Glucose, Bld 124 (*)    Calcium 8.4 (*)    Total Protein 5.6 (*)    Albumin 2.9 (*)    All other components within normal limits  CBC - Abnormal; Notable for the following components:   WBC 23.4 (*)    RBC 3.36 (*)    Hemoglobin 8.2 (*)    HCT 28.0 (*)    MCH 24.4 (*)    MCHC 29.3 (*)    RDW 16.4 (*)    Platelets 416 (*)    All other components within normal limits  URINALYSIS, W/ REFLEX TO CULTURE (INFECTION SUSPECTED) - Abnormal; Notable for the following components:   Color, Urine STRAW (*)    All other components within normal limits                                                                                                                         EKG  EKG Interpretation Date/Time:  Saturday June 18 2024 22:18:16 EDT Ventricular Rate:  94 PR Interval:  149 QRS Duration:  95 QT Interval:  346 QTC Calculation: 433 R Axis:   71  Text Interpretation: Sinus rhythm Probable left atrial enlargement COPY Confirmed by Towana Sharper [phone]) on 06/18/2024 10:25:40 PM       Radiology No results found.  Medications Ordered in ED Medications  sodium chloride  0.9 % bolus 500 mL (0 mLs Intravenous Stopped 06/19/24 0554)   Procedures Procedures  (including critical care time) Medical Decision Making / ED Course   Medical Decision Making Amount and/or Complexity of Data Reviewed Labs: ordered. Decision-making details documented in ED Course. Radiology: ordered and independent interpretation performed. Decision-making details documented in ED Course. ECG/medicine tests: ordered and independent interpretation performed.  Decision-making details documented in ED Course.    Medicine related nausea.  Abdomen benign.  Electrolytes with mild hypokalemia otherwise reassuring.  Mild hyperglycemia without DKA.  No biliary obstruction.  No renal insufficiency.  CBC does show a leukocytosis and anemia.  Patient is afebrile.  Chest x-ray without pneumonia.  UA without evidence of infection.  Monitor for several hours without recurrence.  No bloody bowel movements.    Final Clinical Impression(s) / ED Diagnoses Final diagnoses:  Bleeding nose  Nausea   The patient appears reasonably screened and/or stabilized for discharge and I doubt any other medical condition or other [facility] requiring further screening, evaluation, or treatment in the ED at this time. I have discussed the findings, Dx and Tx plan with the patient/family who expressed understanding and agree(s) with the plan. Discharge instructions discussed at length. The patient/family was given strict return precautions who verbalized understanding of the instructions. No further questions at time of discharge.  Disposition: Discharge  Condition: Good  ED Discharge Orders     None        Follow Up: Austin Mutton, MD [address] [place] [address] [phone]  Call  to schedule an appointment for close follow up    This chart was dictated using voice recognition software.  Despite best efforts to proofread,  errors can occur which can change the documentation meaning.    Trine Raynell Moder, MD 06/20/24 [phone]

## 2024-06-19 ENCOUNTER — Encounter: Payer: Self-pay | Admitting: Pulmonary Disease

## 2024-06-19 DIAGNOSIS — R040 Epistaxis: Secondary | ICD-10-CM | POA: Diagnosis not present

## 2024-06-19 LAB — COMPREHENSIVE METABOLIC PANEL WITH GFR
ALT: 10 U/L (ref 0–44)
AST: 16 U/L (ref 15–41)
Albumin: 2.9 g/dL — ABNORMAL LOW (ref 3.5–5.0)
Alkaline Phosphatase: 53 U/L (ref 38–126)
Anion gap: 10 (ref 5–15)
BUN: 18 mg/dL (ref 8–23)
CO2: 24 mmol/L (ref 22–32)
Calcium: 8.4 mg/dL — ABNORMAL LOW (ref 8.9–10.3)
Chloride: 102 mmol/L (ref 98–111)
Creatinine, Ser: 0.77 mg/dL (ref 0.44–1.00)
GFR, Estimated: >60 mL/min (ref >60)
Glucose, Bld: 124 mg/dL — ABNORMAL HIGH (ref 70–99)
Potassium: 3 mmol/L — ABNORMAL LOW (ref 3.5–5.1)
Sodium: 136 mmol/L (ref 135–145)
Total Bilirubin: 0.6 mg/dL (ref 0.0–1.2)
Total Protein: 5.6 g/dL — ABNORMAL LOW (ref 6.5–8.1)

## 2024-06-19 LAB — URINALYSIS, W/ REFLEX TO CULTURE (INFECTION SUSPECTED)
Bacteria, UA: NONE SEEN
Bilirubin Urine: NEGATIVE
Glucose, UA: NEGATIVE mg/dL
Hgb urine dipstick: NEGATIVE
Ketones, ur: NEGATIVE mg/dL
Leukocytes,Ua: NEGATIVE
Nitrite: NEGATIVE
Protein, ur: NEGATIVE mg/dL
Specific Gravity, Urine: 1.008 (ref 1.005–1.030)
pH: 5 (ref 5.0–8.0)

## 2024-06-19 LAB — CBC
HCT: 28 % — ABNORMAL LOW (ref 36.0–46.0)
Hemoglobin: 8.2 g/dL — ABNORMAL LOW (ref 12.0–15.0)
MCH: 24.4 pg — ABNORMAL LOW (ref 26.0–34.0)
MCHC: 29.3 g/dL — ABNORMAL LOW (ref 30.0–36.0)
MCV: 83.3 fL (ref 80.0–100.0)
Platelets: 416 10*3/uL — ABNORMAL HIGH (ref 150–400)
RBC: 3.36 MIL/uL — ABNORMAL LOW (ref 3.87–5.11)
RDW: 16.4 % — ABNORMAL HIGH (ref 11.5–15.5)
WBC: 23.4 10*3/uL — ABNORMAL HIGH (ref 4.0–10.5)
nRBC: 0 % (ref 0.0–0.2)

## 2024-06-19 MED ORDER — SODIUM CHLORIDE 0.9 % IV BOLUS
500.0000 mL | Freq: Once | INTRAVENOUS | Status: AC
  Administered 2024-06-19: 500 mL via INTRAVENOUS

## 2024-06-19 NOTE — ED Notes (Addendum)
 Called lab for update on labs not showing in process.

## 2024-06-19 NOTE — ED Notes (Signed)
 Changed tape/dressing on IV, was not flowing. Got the IV working again.

## 2024-06-19 NOTE — ED Notes (Signed)
 Let patient know we needed urine sample and asked if she could pee for us . Patient stated she doesn't have to pee. Patient is aware we are waiting on one.

## 2024-06-19 NOTE — ED Notes (Signed)
 Labs recollected, per lab they did not have the samples.

## 2024-06-19 NOTE — ED Notes (Addendum)
 Patient ambulated to the bathroom and back with no assistance or issues. Patient had no complaints of dizziness or lightheadness when walking. Steady gait.

## 2024-06-19 NOTE — ED Notes (Signed)
 Patient assisted to bathroom.  Steady gait noted.  No complaints voiced at this time

## 2024-06-19 NOTE — ED Notes (Signed)
 Patient is resting comfortably at this time. Patient did not have any needs expressed.

## 2024-06-20 ENCOUNTER — Telehealth: Payer: Self-pay

## 2024-06-20 NOTE — Telephone Encounter (Signed)
 Copied from CRM [id number]. Topic: Clinical - Prescription Issue >> Jun 20, 2024  8:26 AM Corean SAUNDERS wrote: Reason for CRM: Patient had an allergic reaction to the roflumilast (DALIRESP) 500 MCG TABS tablet that as prescribed by Dr. Kara and was admitted to the emergency room. Patient is requesting Dr. Kara to order her the Guaifenesin  as an alternative.   Dr Kara, okay to Rx this?

## 2024-06-20 NOTE — Telephone Encounter (Signed)
 Pt notified. NFN

## 2024-06-20 NOTE — Telephone Encounter (Signed)
 Ok to take guafenisin. This is an over the counter medication, no prescription needed.  JD

## 2024-06-23 NOTE — Telephone Encounter (Signed)
 Received CRM: Patient calling, insurance company states they have not received paperwork and patient is not receiving payment. Please email paperwork [email or web address]. Any questions please call patient.  Paperwork has been sent. Left voicemail for patient.

## 2024-06-24 ENCOUNTER — Ambulatory Visit (HOSPITAL_BASED_OUTPATIENT_CLINIC_OR_DEPARTMENT_OTHER)
Admission: RE | Admit: 2024-06-24 | Discharge: 2024-06-24 | Disposition: A | Discharge status: Home / Self Care | Source: Ambulatory Visit | Attending: Pulmonary Disease | Admitting: Pulmonary Disease

## 2024-06-24 DIAGNOSIS — J189 Pneumonia, unspecified organism: Secondary | ICD-10-CM | POA: Diagnosis present

## 2024-06-27 ENCOUNTER — Ambulatory Visit: Payer: Self-pay

## 2024-06-27 NOTE — Telephone Encounter (Signed)
 FYI Only or Action Required?: Action required by provider: Patient requesting medication for symptoms.  Patient is followed in Pulmonology for COPD, last seen on 06/13/2024 by Kara Dorn NOVAK, MD. Called Nurse Triage reporting No chief complaint on file.. Symptoms began several days ago. Interventions attempted: OTC medications: Mucinex , Prescription medications: Albuterol  inhaler, nebulizer, Maintenance inhaler, and Home oxygen  use. Symptoms are: gradually worsening.  Triage Disposition: See HCP Within 4 Hours (Or PCP Triage)  Patient/caregiver understands and will follow disposition?: No  Copied from CRM [id number]. Topic: Clinical - Red Word Triage >> Jun 27, 2024  8:14 AM [name] wrote: Red Word that prompted transfer to Nurse Triage: Patient [phone] states went to pcp last Thursday for breathing issues and received a shot: steriod and antibotics. Patient recently had a CT 06/24/24 and thinks it's pneumonia due to back pain. Patient states couging very little phlegm, wheezing, shortness of breath and night sweats. Patient wants to be see Dr. Kara, please advise. Reason for Disposition  Wheezing is present  Answer Assessment - Initial Assessment Questions 1. ONSET: When did the cough begin?      06-24-2024  2. SEVERITY: How bad is the cough today?      Constant  3. SPUTUM: Describe the color of your sputum (none, dry cough; clear, white, yellow, green)     Whitish yellow  4. HEMOPTYSIS: Are you coughing up any blood? If so ask: How much? (flecks, streaks, tablespoons, etc.)     No  5. DIFFICULTY BREATHING: Are you having difficulty breathing? If Yes, ask: How bad is it? (e.g., mild, moderate, severe)    - MILD: No SOB at rest, mild SOB with walking, speaks normally in sentences, can lie down, no retractions, pulse < 100.    - MODERATE: SOB at rest, SOB with minimal exertion and prefers to sit, cannot lie down flat, speaks in phrases, mild retractions, audible  wheezing, pulse 100-120.    - SEVERE: Very SOB at rest, speaks in single words, struggling to breathe, sitting hunched forward, retractions, pulse > 120      Moderate  6. FEVER: Do you have a fever? If Yes, ask: What is your temperature, how was it measured, and when did it start?     Unsure  7. CARDIAC HISTORY: Do you have any history of heart disease? (e.g., heart attack, congestive heart failure)       Hypertension, Heart Failure  8. LUNG HISTORY: Do you have any history of lung disease?  (e.g., pulmonary embolus, asthma, emphysema)     COPD, Bronchitis  9. PE RISK FACTORS: Do you have a history of blood clots? (or: recent major surgery, recent prolonged travel, bedridden)     No  10. OTHER SYMPTOMS: Do you have any other symptoms? (e.g., runny nose, wheezing, chest pain)       Congestion, Night Sweats, Wheezing  11. PREGNANCY: Is there any chance you are pregnant? When was your last menstrual period?       No and No  12. TRAVEL: Have you traveled out of the country in the last month? (e.g., travel history, exposures)       No  Refused to come in and be seen by a provider after triage, would rather have a medication called in to the [facility] on [address].  Protocols used: Cough - Acute Productive-A-AH

## 2024-06-27 NOTE — Telephone Encounter (Signed)
 I am waiting for final radiology review of patient's CT Chest scan but it appears she has ongoing inflammation of her lungs concerning for organizing pneumonia.  In order to confirm this diagnosis we would need to do a bronchoscopy with biopsies of the lung.   Other options is to start treatment empirically with extended prednisone  taper over the next 3 months with a prophylactic antibiotic.   Please have patient let us  know what she would prefer to do.  Thanks, JD

## 2024-06-27 NOTE — Telephone Encounter (Signed)
 Please advise Dr. Francine Graven

## 2024-06-28 ENCOUNTER — Telehealth: Payer: Self-pay | Admitting: Pulmonary Disease

## 2024-06-28 NOTE — Telephone Encounter (Signed)
 Short term disability received from [employer] . Patient notified that we received forms and of $29 year long fee for processing.

## 2024-06-29 LAB — ACID FAST CULTURE WITH REFLEXED SENSITIVITIES (MYCOBACTERIA)
Acid Fast Culture: NEGATIVE
Acid Fast Culture: NEGATIVE

## 2024-06-29 NOTE — Telephone Encounter (Signed)
 LM - okay Per HIPAA   NFN

## 2024-06-30 ENCOUNTER — Telehealth: Payer: Self-pay

## 2024-06-30 ENCOUNTER — Telehealth: Payer: Self-pay | Admitting: *Deleted

## 2024-06-30 DIAGNOSIS — J8489 Other specified interstitial pulmonary diseases: Secondary | ICD-10-CM

## 2024-06-30 MED ORDER — PREDNISONE 10 MG PO TABS: ORAL_TABLET | ORAL | 0 refills | Status: AC

## 2024-06-30 MED ORDER — SULFAMETHOXAZOLE-TRIMETHOPRIM 800-160 MG PO TABS: 1.0000 | ORAL_TABLET | ORAL | 0 refills | Status: DC

## 2024-06-30 NOTE — Telephone Encounter (Signed)
 I spoke with patient regarding her symptoms of increasing dyspnea. We reviewed her CT chest scan results which are concerning for organizing pneumonia.  She is to start high dose steroid taper 40mg  daily for 14 days then 30mg  daily for 14 days. She will also start bactrim DS 1 tab 3 days per week for pjp prophylaxis.   She has follow up in clinic on 7/9. Will discuss need for bronchoscopy at that time.  Dorn Chill, MD [facility] Office: [phone]   See Amion for personal pager PCCM on call pager [phone] until 7pm. Please call Elink 7p-7a. [phone]

## 2024-06-30 NOTE — Telephone Encounter (Signed)
 I called and spoke with the pt  She reports having increased SOB, wheezing, chest tightness and cough x 3 days  Her cough has not been productive  She denies any fever,nasal symptoms, sore throat, HA She has some mild back pain She is taking her trelegy, singualir, ohtuvayre , albuterol  and saline neb bid with flutter valve after  She is also on daliresp daily  She is asking for something to be sent to pharmacy  He do not have any openings in clinic at this time  Please advise, thanks!  Allergies  Allergen Reactions   Codeine Nausea And Vomiting

## 2024-06-30 NOTE — Telephone Encounter (Signed)
 You need to call patient and triage this  Also I have never seen patient and I am not provider of the day

## 2024-06-30 NOTE — Addendum Note (Signed)
 Addended by: Keyara Ent on: 06/30/2024 01:23 PM   Modules accepted: Orders

## 2024-06-30 NOTE — Telephone Encounter (Signed)
Triage - please call patient.

## 2024-06-30 NOTE — Telephone Encounter (Signed)
 Duplicate encounter

## 2024-07-02 ENCOUNTER — Observation Stay (HOSPITAL_COMMUNITY)
Admission: EM | Admit: 2024-07-02 | Discharge: 2024-07-04 | Disposition: A | Discharge status: Home / Self Care | Attending: Internal Medicine | Admitting: Internal Medicine

## 2024-07-02 ENCOUNTER — Other Ambulatory Visit: Payer: Self-pay

## 2024-07-02 ENCOUNTER — Encounter (HOSPITAL_COMMUNITY): Payer: Self-pay | Admitting: *Deleted

## 2024-07-02 ENCOUNTER — Emergency Department (HOSPITAL_COMMUNITY)

## 2024-07-02 DIAGNOSIS — R0602 Shortness of breath: Secondary | ICD-10-CM | POA: Diagnosis present

## 2024-07-02 DIAGNOSIS — E785 Hyperlipidemia, unspecified: Secondary | ICD-10-CM | POA: Diagnosis not present

## 2024-07-02 DIAGNOSIS — I5032 Chronic diastolic (congestive) heart failure: Secondary | ICD-10-CM | POA: Diagnosis present

## 2024-07-02 DIAGNOSIS — J9611 Chronic respiratory failure with hypoxia: Secondary | ICD-10-CM | POA: Diagnosis not present

## 2024-07-02 DIAGNOSIS — J441 Chronic obstructive pulmonary disease with (acute) exacerbation: Principal | ICD-10-CM

## 2024-07-02 DIAGNOSIS — D509 Iron deficiency anemia, unspecified: Secondary | ICD-10-CM | POA: Diagnosis present

## 2024-07-02 DIAGNOSIS — I110 Hypertensive heart disease with heart failure: Secondary | ICD-10-CM | POA: Insufficient documentation

## 2024-07-02 DIAGNOSIS — Z7982 Long term (current) use of aspirin: Secondary | ICD-10-CM | POA: Diagnosis not present

## 2024-07-02 DIAGNOSIS — K219 Gastro-esophageal reflux disease without esophagitis: Secondary | ICD-10-CM | POA: Insufficient documentation

## 2024-07-02 DIAGNOSIS — J9621 Acute and chronic respiratory failure with hypoxia: Secondary | ICD-10-CM

## 2024-07-02 DIAGNOSIS — Z9981 Dependence on supplemental oxygen: Secondary | ICD-10-CM

## 2024-07-02 DIAGNOSIS — D649 Anemia, unspecified: Secondary | ICD-10-CM | POA: Diagnosis not present

## 2024-07-02 DIAGNOSIS — R0609 Other forms of dyspnea: Secondary | ICD-10-CM | POA: Diagnosis present

## 2024-07-02 DIAGNOSIS — I10 Essential (primary) hypertension: Secondary | ICD-10-CM | POA: Diagnosis present

## 2024-07-02 LAB — BASIC METABOLIC PANEL WITH GFR
Anion gap: 14 (ref 5–15)
BUN: 23 mg/dL (ref 8–23)
CO2: 24 mmol/L (ref 22–32)
Calcium: 9.2 mg/dL (ref 8.9–10.3)
Chloride: 95 mmol/L — ABNORMAL LOW (ref 98–111)
Creatinine, Ser: 1.16 mg/dL — ABNORMAL HIGH (ref 0.44–1.00)
GFR, Estimated: 51 mL/min — ABNORMAL LOW (ref >60)
Glucose, Bld: 131 mg/dL — ABNORMAL HIGH (ref 70–99)
Potassium: 3.9 mmol/L (ref 3.5–5.1)
Sodium: 133 mmol/L — ABNORMAL LOW (ref 135–145)

## 2024-07-02 LAB — BLOOD GAS, VENOUS
Acid-Base Excess: 4.9 mmol/L — ABNORMAL HIGH (ref 0.0–2.0)
Bicarbonate: 28.1 mmol/L — ABNORMAL HIGH (ref 20.0–28.0)
O2 Saturation: 94.4 %
Patient temperature: 37
pCO2, Ven: 36 mmHg — ABNORMAL LOW (ref 44–60)
pH, Ven: 7.5 — ABNORMAL HIGH (ref 7.25–7.43)
pO2, Ven: 60 mmHg — ABNORMAL HIGH (ref 32–45)

## 2024-07-02 LAB — CBC
HCT: 26.5 % — ABNORMAL LOW (ref 36.0–46.0)
Hemoglobin: 7.8 g/dL — ABNORMAL LOW (ref 12.0–15.0)
MCH: 23.6 pg — ABNORMAL LOW (ref 26.0–34.0)
MCHC: 29.4 g/dL — ABNORMAL LOW (ref 30.0–36.0)
MCV: 80.3 fL (ref 80.0–100.0)
Platelets: 490 K/uL — ABNORMAL HIGH (ref 150–400)
RBC: 3.3 MIL/uL — ABNORMAL LOW (ref 3.87–5.11)
RDW: 17.8 % — ABNORMAL HIGH (ref 11.5–15.5)
WBC: 12.4 K/uL — ABNORMAL HIGH (ref 4.0–10.5)
nRBC: 0 % (ref 0.0–0.2)

## 2024-07-02 LAB — RESP PANEL BY RT-PCR (RSV, FLU A&B, COVID)  RVPGX2
Influenza A by PCR: NEGATIVE
Influenza B by PCR: NEGATIVE
Resp Syncytial Virus by PCR: NEGATIVE
SARS Coronavirus 2 by RT PCR: NEGATIVE

## 2024-07-02 LAB — BRAIN NATRIURETIC PEPTIDE: B Natriuretic Peptide: 89.7 pg/mL (ref 0.0–100.0)

## 2024-07-02 LAB — LACTIC ACID, PLASMA
Lactic Acid, Venous: 1.5 mmol/L (ref 0.5–1.9)
Lactic Acid, Venous: 1.2 mmol/L (ref 0.5–1.9)

## 2024-07-02 MED ORDER — ONDANSETRON HCL 4 MG PO TABS
4.0000 mg | ORAL_TABLET | Freq: Four times a day (QID) | ORAL | Status: DC | PRN
  Filled 2024-07-02: qty 1

## 2024-07-02 MED ORDER — FUROSEMIDE 20 MG PO TABS
20.0000 mg | ORAL_TABLET | Freq: Every day | ORAL | Status: DC
  Administered 2024-07-03 – 2024-07-04 (×2): 20 mg via ORAL
  Filled 2024-07-02 (×2): qty 1

## 2024-07-02 MED ORDER — SIMVASTATIN 40 MG PO TABS
40.0000 mg | ORAL_TABLET | Freq: Every day | ORAL | Status: DC
  Administered 2024-07-02 – 2024-07-03 (×2): 40 mg via ORAL
  Filled 2024-07-02 (×2): qty 1

## 2024-07-02 MED ORDER — ACETAMINOPHEN 325 MG PO TABS
650.0000 mg | ORAL_TABLET | Freq: Four times a day (QID) | ORAL | Status: DC | PRN
  Administered 2024-07-03: 650 mg via ORAL
  Filled 2024-07-02 (×2): qty 2

## 2024-07-02 MED ORDER — ENOXAPARIN SODIUM 40 MG/0.4ML IJ SOSY
40.0000 mg | PREFILLED_SYRINGE | INTRAMUSCULAR | Status: DC
  Administered 2024-07-02 – 2024-07-03 (×2): 40 mg via SUBCUTANEOUS
  Filled 2024-07-02 (×2): qty 0.4

## 2024-07-02 MED ORDER — ROFLUMILAST 500 MCG PO TABS: 500.0000 ug | ORAL_TABLET | Freq: Every day | ORAL | Status: DC

## 2024-07-02 MED ORDER — BUDESON-GLYCOPYRROL-FORMOTEROL 160-9-4.8 MCG/ACT IN AERO: 2.0000 | INHALATION_SPRAY | Freq: Two times a day (BID) | RESPIRATORY_TRACT | Status: DC

## 2024-07-02 MED ORDER — PANTOPRAZOLE SODIUM 40 MG PO TBEC
40.0000 mg | DELAYED_RELEASE_TABLET | Freq: Every day | ORAL | Status: DC
  Administered 2024-07-02 – 2024-07-04 (×3): 40 mg via ORAL
  Filled 2024-07-02 (×3): qty 1

## 2024-07-02 MED ORDER — ALBUTEROL SULFATE (2.5 MG/3ML) 0.083% IN NEBU: 2.5000 mg | INHALATION_SOLUTION | RESPIRATORY_TRACT | Status: DC | PRN

## 2024-07-02 MED ORDER — IPRATROPIUM BROMIDE 0.02 % IN SOLN
0.5000 mg | Freq: Once | RESPIRATORY_TRACT | Status: AC
  Administered 2024-07-02: 0.5 mg via RESPIRATORY_TRACT
  Filled 2024-07-02: qty 2.5

## 2024-07-02 MED ORDER — ACETAMINOPHEN 650 MG RE SUPP: 650.0000 mg | Freq: Four times a day (QID) | RECTAL | Status: DC | PRN

## 2024-07-02 MED ORDER — ONDANSETRON HCL 4 MG/2ML IJ SOLN: 4.0000 mg | Freq: Four times a day (QID) | INTRAMUSCULAR | Status: DC | PRN

## 2024-07-02 MED ORDER — METHYLPREDNISOLONE SODIUM SUCC 40 MG IJ SOLR
40.0000 mg | Freq: Two times a day (BID) | INTRAMUSCULAR | Status: DC
  Administered 2024-07-02 – 2024-07-03 (×2): 40 mg via INTRAVENOUS
  Filled 2024-07-02 (×2): qty 1

## 2024-07-02 MED ORDER — MONTELUKAST SODIUM 10 MG PO TABS
10.0000 mg | ORAL_TABLET | Freq: Every day | ORAL | Status: DC
  Administered 2024-07-02 – 2024-07-03 (×2): 10 mg via ORAL
  Filled 2024-07-02 (×3): qty 1

## 2024-07-02 MED ORDER — SULFAMETHOXAZOLE-TRIMETHOPRIM 800-160 MG PO TABS
1.0000 | ORAL_TABLET | ORAL | Status: DC
  Administered 2024-07-04: 1 via ORAL
  Filled 2024-07-02: qty 1

## 2024-07-02 MED ORDER — IPRATROPIUM-ALBUTEROL 0.5-2.5 (3) MG/3ML IN SOLN
3.0000 mL | Freq: Four times a day (QID) | RESPIRATORY_TRACT | Status: DC
  Administered 2024-07-02 – 2024-07-03 (×5): 3 mL via RESPIRATORY_TRACT
  Filled 2024-07-02 (×5): qty 3

## 2024-07-02 MED ORDER — PREDNISONE 20 MG PO TABS: 40.0000 mg | ORAL_TABLET | Freq: Every day | ORAL | Status: DC

## 2024-07-02 MED ORDER — ALBUTEROL SULFATE (2.5 MG/3ML) 0.083% IN NEBU
5.0000 mg | INHALATION_SOLUTION | Freq: Once | RESPIRATORY_TRACT | Status: AC
  Administered 2024-07-02: 5 mg via RESPIRATORY_TRACT
  Filled 2024-07-02: qty 6

## 2024-07-02 NOTE — H&P (Signed)
 History and Physical  Breanna Ramirez FMW:[id number] DOB: [birth year] DOA: 07/02/2024  PCP: Austin Mutton, MD   Chief Complaint: Wheezing, shortness of breath  HPI: Breanna Ramirez is a 68 y.o. female with medical history significant for GERD, hypertension, chronic hypoxic respiratory failure on 3 L nasal cannula oxygen , COPD being admitted to the hospital with 24 hours of increased cough, wheezing, dyspnea with exertion likely due to acute exacerbation of COPD.  She is followed by pulmonology Dr. Kara, she had outpatient screening chest CT last week after which she was started just 2 days ago on high-dose prednisone  and Bactrim  for PJP prophylaxis.  She has been compliant with these medications, at the time she states that she really was not symptomatic, was at her baseline.  However the last 24 hours she started having increasing cough, wheezing, and significant dyspnea with exertion.  She denies any increased sputum production, chest pain, or fevers.  She came to the ER this morning by private vehicle, was wheezing very badly, was unable to speak 1 or 2 words at a time.  She was given breathing treatments, and her breathing is now improved and she is stable on her baseline oxygen .  Due to the severity of her dyspnea, hospitalist observation admission was requested.  Review of Systems: Please see HPI for pertinent positives and negatives. A complete 10 system review of systems are otherwise negative.  Past Medical History:  Diagnosis Date   COPD (chronic obstructive pulmonary disease) (HCC)    Essential hypertension    GERD (gastroesophageal reflux disease)    Pneumonia 05/2017   Respiratory failure with hypoxia (HCC) 05/2017   Past Surgical History:  Procedure Laterality Date   BRONCHIAL WASHINGS  05/16/2024   Procedure: IRRIGATION, BRONCHUS;  Surgeon: Claudene Toribio BROCKS, MD;  Location: [facility];  Service: Endoscopy;;   ENDOMETRIAL ABLATION     FLEXIBLE BRONCHOSCOPY Bilateral 05/16/2024    Procedure: ELLIOTT SIDE;  Surgeon: Claudene Toribio BROCKS, MD;  Location: [facility];  Service: Endoscopy;  Laterality: Bilateral;   Social History:  reports that she quit smoking about 6 years ago. Her smoking use included cigarettes. She started smoking about 51 years ago. She has a 45 pack-year smoking history. She has never used smokeless tobacco. She reports that she does not drink alcohol and does not use drugs.  Allergies  Allergen Reactions   Codeine Nausea And Vomiting    Family History  Problem Relation Age of Onset   Cancer Father      Prior to Admission medications   Medication Sig Start Date End Date Taking? Authorizing Provider  albuterol  (VENTOLIN  HFA) 108 (90 Base) MCG/ACT inhaler Inhale 2 puffs into the lungs every 6 (six) hours as needed for wheezing or shortness of breath. 05/18/24   Dahal, Chapman, MD  Aspirin -Salicylamide-Caffeine (BC HEADACHE POWDER PO) Take 1 packet by mouth as needed.    [provider]  cholecalciferol (VITAMIN D3) 25 MCG (1000 UNIT) tablet Take 1,000 Units by mouth every other day.    [provider]  Ensifentrine  (OHTUVAYRE ) 3 MG/2.5ML SUSP Inhale 1 Inhalation into the lungs 2 (two) times daily. 04/26/24   Parrett, Madelin RAMAN, NP  esomeprazole  (NEXIUM ) 40 MG capsule Take 1 capsule (40 mg total) by mouth daily. Patient taking differently: Take 40 mg by mouth at bedtime. 09/13/14   Lavell Lye A, FNP  fluticasone  (FLONASE ) 50 MCG/ACT nasal spray Place 1 spray into both nostrils every evening. 06/13/24   Kara Dorn NOVAK, MD  Fluticasone -Umeclidin-Vilant (TRELEGY  ELLIPTA) 200-62.5-25 MCG/ACT AEPB Inhale 1 puff into the lungs daily. 06/13/24   Kara Dorn NOVAK, MD  furosemide  (LASIX ) 20 MG tablet Take 1 tablet (20 mg total) by mouth daily. 05/20/24 05/20/25  Mannam, Praveen, MD  hydrochlorothiazide  (HYDRODIURIL ) 25 MG tablet Take 1 tablet (25 mg total) by mouth daily. Patient taking differently: Take 25 mg by mouth in the morning.  09/13/14   Lavell Lye A, FNP  ibuprofen (ADVIL) 200 MG tablet Take 400 mg by mouth as needed for mild pain (pain score 1-3) or moderate pain (pain score 4-6).    [provider]  montelukast  (SINGULAIR ) 10 MG tablet Take 1 tablet (10 mg total) by mouth at bedtime. 08/25/22   Kara Dorn NOVAK, MD  predniSONE  (DELTASONE ) 10 MG tablet Take 4 tablets (40 mg total) by mouth daily with breakfast for 14 days, THEN 3 tablets (30 mg total) daily with breakfast for 14 days. 06/30/24 07/28/24  Kara Dorn NOVAK, MD  roflumilast  (DALIRESP ) 500 MCG TABS tablet Take 1 tablet (500 mcg total) by mouth daily. 06/13/24   Kara Dorn NOVAK, MD  simvastatin  (ZOCOR ) 40 MG tablet Take 1 tablet (40 mg total) by mouth at bedtime. 09/15/14   Lavell Lye A, FNP  sodium chloride  HYPERTONIC 3 % nebulizer solution Take by nebulization as needed for other. 06/13/24   Kara Dorn NOVAK, MD  sulfamethoxazole -trimethoprim  (BACTRIM  DS) 800-160 MG tablet Take 1 tablet by mouth 3 (three) times a week. 07/01/24   Kara Dorn NOVAK, MD    Physical Exam: BP (!) 107/57   Pulse 100   Temp 97.9 F (36.6 C) (Oral)   Resp 16   Wt 55.8 kg   SpO2 94%   BMI 20.79 kg/m  General:  Alert, oriented, calm, in no acute distress, wearing 3 L nasal cannula oxygen  Cardiovascular: RRR, no murmurs or rubs, no peripheral edema  Respiratory: clear to auscultation bilaterally, no wheezes, no crackles  Abdomen: soft, nontender, nondistended, normal bowel tones heard  Skin: dry, no rashes  Musculoskeletal: no joint effusions, normal range of motion  Psychiatric: appropriate affect, normal speech  Neurologic: extraocular muscles intact, clear speech, moving all extremities with intact sensorium         Labs on Admission:  Basic Metabolic Panel: Recent Labs  Lab 07/02/24 1108  NA 133*  K 3.9  CL 95*  CO2 24  GLUCOSE 131*  BUN 23  CREATININE 1.16*  CALCIUM 9.2   Liver Function Tests: No results for input(s): AST, ALT,  ALKPHOS, BILITOT, PROT, ALBUMIN in the last 168 hours. No results for input(s): LIPASE, AMYLASE in the last 168 hours. No results for input(s): AMMONIA in the last 168 hours. CBC: Recent Labs  Lab 07/02/24 1108  WBC 12.4*  HGB 7.8*  HCT 26.5*  MCV 80.3  PLT 490*   Cardiac Enzymes: No results for input(s): CKTOTAL, CKMB, CKMBINDEX, TROPONINI in the last 168 hours. BNP (last 3 results) Recent Labs    03/14/24 1352 05/15/24 1134 07/02/24 1108  BNP 66.5 44.5 89.7    ProBNP (last 3 results) No results for input(s): PROBNP in the last 8760 hours.  CBG: No results for input(s): GLUCAP in the last 168 hours.  Radiological Exams on Admission: DG Chest Portable 1 View Result Date: 07/02/2024 CLINICAL DATA:  Shortness of breath EXAM: PORTABLE CHEST 1 VIEW COMPARISON:  06/18/2024 FINDINGS: Cardiac enlargement is stable. Aortic atherosclerotic calcifications. Right mid and left mid lung zone areas of subsegmental scarring versus atelectasis appears similar to the  previous exam. Bandlike area of atelectasis within the right lung base is M proved in the interval. There is new blunting of the right costophrenic angle. Increased interstitial markings within the left lung are unchanged from the prior exam. No focal consolidative change identified. IMPRESSION: 1. New blunting of the right costophrenic angle compatible with small right pleural effusion. 2. Stable cardiomegaly. 3. Stable areas of subsegmental scarring versus atelectasis within the right mid and left mid lung zone. 4. Asymmetric increase interstitial markings are noted within the left lung which correlate with the findings from the high-resolution CT of the chest from 06/24/2024. This is a nonspecific finding and may be inflammatory or infectious in etiology. Underlying interstitial lung disease not excluded. See high-resolution CT report from 06/24/2024 for further details. Electronically Signed   By: Waddell Calk M.D.   On: 07/02/2024 11:28   Assessment/Plan Breanna Ramirez is a 68 y.o. female with medical history significant for GERD, hypertension, chronic hypoxic respiratory failure on 3 L nasal cannula oxygen , COPD being admitted to the hospital with 24 hours of increased cough, wheezing, dyspnea with exertion likely due to acute exacerbation of COPD.  Acute exacerbation of COPD-with increased cough, wheezing, dyspnea, significant improvement with Atrovent  and albuterol  nebulizer.  She has leukocytosis likely due to recent steroid use, no fever, no obvious consolidation so no objective evidence of bacterial infection.  Discussed with the patient that although she looks quite comfortable currently, I recommend observation admission since she worsened so quickly in the last 24 hours. -Observation admission -Continue supplemental oxygen , goal O2 saturation greater than 89% -Continue home 40 mg prednisone  p.o. daily, along with Bactrim  for PJP prophylaxis -Check viral respiratory panel -Scheduled DuoNebs, with as needed albuterol  inhaler -Singulair  and Daliresp  -Incentive spirometer and flutter valve  Hyperlipidemia-Zocor   GERD-Protonix   DVT prophylaxis: Lovenox      Code Status: Full Code  Consults called: None  Admission status: Observation  Time spent: 59 minutes  Breanna Ramirez CHRISTELLA Gail MD [facility] Pager [phone]  If 7PM-7AM, please contact night-coverage [email or web address] Password [email or web address]  07/02/2024, 1:01 PM

## 2024-07-02 NOTE — ED Provider Notes (Signed)
 Breanna Ramirez Provider Note   CSN: [id number] Arrival date & time: 07/02/24  1016     Patient presents with: Shortness of Breath   Breanna Ramirez is a 68 y.o. female.   68 year old female presenting emergency department for shortness of breath.  History of COPD on 3 L nasal cannula at baseline.  Diagnosed with pneumonia recently.  Has had progressive shortness of breath that worsened last night and today.  Feels like her inhalers are not working anymore.  No chest pain.  No nausea no vomiting.   Shortness of Breath      Prior to Admission medications   Medication Sig Start Date End Date Taking? Authorizing Provider  albuterol  (VENTOLIN  HFA) 108 (90 Base) MCG/ACT inhaler Inhale 2 puffs into the lungs every 6 (six) hours as needed for wheezing or shortness of breath. Patient taking differently: Inhale 2 puffs into the lungs every 4 (four) hours as needed for wheezing or shortness of breath. 05/18/24  Yes Dahal, Chapman, MD  Aspirin -Salicylamide-Caffeine (BC HEADACHE POWDER PO) Take 1 packet by mouth daily as needed (for headaches or back pain).   Yes [provider]  cholecalciferol (VITAMIN D3) 25 MCG (1000 UNIT) tablet Take 1,000 Units by mouth every other day.   Yes [provider]  Ensifentrine  (OHTUVAYRE ) 3 MG/2.5ML SUSP Inhale 1 Inhalation into the lungs 2 (two) times daily. Patient taking differently: Inhale 3 mg into the lungs 2 (two) times daily. 04/26/24  Yes Parrett, [name] S, NP  esomeprazole  (NEXIUM ) 40 MG capsule Take 1 capsule (40 mg total) by mouth daily. Patient taking differently: Take 40 mg by mouth at bedtime. 09/13/14  Yes Hawks, Christy A, FNP  fluticasone  (FLONASE ) 50 MCG/ACT nasal spray Place 1 spray into both nostrils every evening. Patient taking differently: Place 1 spray into both nostrils 2 (two) times daily as needed for allergies or rhinitis. 06/13/24  Yes Kara Dorn NOVAK, MD   Fluticasone -Umeclidin-Vilant (TRELEGY ELLIPTA ) 200-62.5-25 MCG/ACT AEPB Inhale 1 puff into the lungs daily. 06/13/24  Yes Kara Dorn NOVAK, MD  furosemide  (LASIX ) 20 MG tablet Take 1 tablet (20 mg total) by mouth daily. Patient taking differently: Take 20 mg by mouth daily as needed for fluid or edema. 05/20/24 05/20/25 Yes Mannam, Praveen, MD  hydrochlorothiazide  (HYDRODIURIL ) 25 MG tablet Take 1 tablet (25 mg total) by mouth daily. Patient taking differently: Take 25 mg by mouth in the morning. 09/13/14  Yes Hawks, Christy A, FNP  ibuprofen (ADVIL) 200 MG tablet Take 400 mg by mouth every 6 (six) hours as needed for mild pain (pain score 1-3) or headache.   Yes [provider]  montelukast  (SINGULAIR ) 10 MG tablet Take 1 tablet (10 mg total) by mouth at bedtime. 08/25/22  Yes Kara Dorn NOVAK, MD  OXYGEN  Inhale 3 L/min into the lungs continuous.   Yes [provider]  predniSONE  (DELTASONE ) 10 MG tablet Take 4 tablets (40 mg total) by mouth daily with breakfast for 14 days, THEN 3 tablets (30 mg total) daily with breakfast for 14 days. 06/30/24 07/28/24 Yes Kara Dorn NOVAK, MD  simvastatin  (ZOCOR ) 40 MG tablet Take 1 tablet (40 mg total) by mouth at bedtime. 09/15/14  Yes Hawks, Christy A, FNP  sodium chloride  HYPERTONIC 3 % nebulizer solution Take by nebulization as needed for other. Patient taking differently: Take 4 mLs by nebulization as needed (as directed for COPD- related symptoms). 06/13/24  Yes Kara Dorn NOVAK, MD  sulfamethoxazole -trimethoprim  (BACTRIM  DS) 800-160  MG tablet Take 1 tablet by mouth 3 (three) times a week. 07/01/24  Yes Kara Dorn NOVAK, MD  roflumilast  (DALIRESP ) 500 MCG TABS tablet Take 1 tablet (500 mcg total) by mouth daily. Patient not taking: Reported on 07/02/2024 06/13/24   Kara Dorn NOVAK, MD    Allergies: Codeine and Daliresp  [roflumilast ]    Review of Systems  Respiratory:  Positive for shortness of breath.     Updated Vital Signs BP  (!) 103/50   Pulse (!) 105   Temp 97.9 F (36.6 C) (Oral)   Resp 18   Wt 55.8 kg   SpO2 94%   BMI 20.79 kg/m   Physical Exam Vitals and nursing note reviewed.  Constitutional:      General: She is not in acute distress.    Appearance: She is not toxic-appearing.  HENT:     Head: Normocephalic.  Cardiovascular:     Rate and Rhythm: Normal rate and regular rhythm.  Pulmonary:     Effort: Pulmonary effort is normal.     Breath sounds: No wheezing, rhonchi or rales.  Musculoskeletal:     Cervical back: Normal range of motion.     Right lower leg: No edema.     Left lower leg: No edema.  Skin:    General: Skin is warm.     Capillary Refill: Capillary refill takes less than 2 seconds.  Neurological:     Mental Status: She is alert and oriented to person, place, and time.  Psychiatric:        Mood and Affect: Mood normal.        Behavior: Behavior normal.     (all labs ordered are listed, but only abnormal results are displayed) Labs Reviewed  CBC - Abnormal; Notable for the following components:      Result Value   WBC 12.4 (*)    RBC 3.30 (*)    Hemoglobin 7.8 (*)    HCT 26.5 (*)    MCH 23.6 (*)    MCHC 29.4 (*)    RDW 17.8 (*)    Platelets 490 (*)    All other components within normal limits  BASIC METABOLIC PANEL WITH GFR - Abnormal; Notable for the following components:   Sodium 133 (*)    Chloride 95 (*)    Glucose, Bld 131 (*)    Creatinine, Ser 1.16 (*)    GFR, Estimated 51 (*)    All other components within normal limits  BLOOD GAS, VENOUS - Abnormal; Notable for the following components:   pH, Ven 7.5 (*)    pCO2, Ven 36 (*)    pO2, Ven 60 (*)    Bicarbonate 28.1 (*)    Acid-Base Excess 4.9 (*)    All other components within normal limits  RESP PANEL BY RT-PCR (RSV, FLU A&B, COVID)  [id number]  LACTIC ACID, PLASMA  LACTIC ACID, PLASMA  BRAIN NATRIURETIC PEPTIDE  POC OCCULT BLOOD, ED    EKG: None  Radiology: DG Chest Portable 1 View Result  Date: 07/02/2024 CLINICAL DATA:  Shortness of breath EXAM: PORTABLE CHEST 1 VIEW COMPARISON:  06/18/2024 FINDINGS: Cardiac enlargement is stable. Aortic atherosclerotic calcifications. Right mid and left mid lung zone areas of subsegmental scarring versus atelectasis appears similar to the previous exam. Bandlike area of atelectasis within the right lung base is M proved in the interval. There is new blunting of the right costophrenic angle. Increased interstitial markings within the left lung are unchanged from the prior exam. No  focal consolidative change identified. IMPRESSION: 1. New blunting of the right costophrenic angle compatible with small right pleural effusion. 2. Stable cardiomegaly. 3. Stable areas of subsegmental scarring versus atelectasis within the right mid and left mid lung zone. 4. Asymmetric increase interstitial markings are noted within the left lung which correlate with the findings from the high-resolution CT of the chest from 06/24/2024. This is a nonspecific finding and may be inflammatory or infectious in etiology. Underlying interstitial lung disease not excluded. See high-resolution CT report from 06/24/2024 for further details. Electronically Signed   By: Waddell Calk M.D.   On: 07/02/2024 11:28     Procedures   Medications Ordered in the ED  sulfamethoxazole -trimethoprim  (BACTRIM  DS) 800-160 MG per tablet 1 tablet (has no administration in time range)  furosemide  (LASIX ) tablet 20 mg (has no administration in time range)  simvastatin  (ZOCOR ) tablet 40 mg (has no administration in time range)  pantoprazole  (PROTONIX ) EC tablet 40 mg (has no administration in time range)  montelukast  (SINGULAIR ) tablet 10 mg (has no administration in time range)  enoxaparin  (LOVENOX ) injection 40 mg (has no administration in time range)  acetaminophen  (TYLENOL ) tablet 650 mg (has no administration in time range)    Or  acetaminophen  (TYLENOL ) suppository 650 mg (has no administration in  time range)  ondansetron  (ZOFRAN ) tablet 4 mg (has no administration in time range)    Or  ondansetron  (ZOFRAN ) injection 4 mg (has no administration in time range)  albuterol  (PROVENTIL ) (2.5 MG/3ML) 0.083% nebulizer solution 2.5 mg (has no administration in time range)  methylPREDNISolone  sodium succinate (SOLU-MEDROL ) 40 mg/mL injection 40 mg (40 mg Intravenous Given 07/02/24 1346)  ipratropium-albuterol  (DUONEB) 0.5-2.5 (3) MG/3ML nebulizer solution 3 mL (3 mLs Nebulization Given 07/02/24 1345)  albuterol  (PROVENTIL ) (2.5 MG/3ML) 0.083% nebulizer solution 5 mg (5 mg Nebulization Given 07/02/24 1054)  ipratropium (ATROVENT ) nebulizer solution 0.5 mg (0.5 mg Nebulization Given 07/02/24 1054)    Clinical Course as of 07/02/24 1529  Sat Jul 02, 2024  1026 Ct 6/27: MPRESSION: Multifocal consolidation/foci of atelectasis with associated bronchiectasis and bronchiolectasis, mosaic ground-glass attenuation and interlobular septal thickening. Findings can be seen the setting of multifocal infection/inflammation ( organizing pneumonias), chronic hypersensitivity pneumonitis, fibrotic NSIP, and sarcoidosis. Correlate with clinical findings and follow-up to ensure stability/resolution.   Scattered pulmonary micro nodules are stable to prior. No new nodules. Follow-up with chest CT in 1 year to ensure stability.   Aortic Atherosclerosis (ICD10-I70.0) and Emphysema (ICD10-J43.9).     Electronically Signed   By: Megan  Zare M.D.   On: 06/29/2024 20:01  [TY]  1027 Pulm note per chart review : I spoke with patient regarding her symptoms of increasing dyspnea. We reviewed her CT chest scan results which are concerning for organizing pneumonia.   She is to start high dose steroid taper 40mg  daily for 14 days then 30mg  daily for 14 days. She will also start bactrim  DS 1 tab 3 days per week for pjp prophylaxis.    She has follow up in clinic on 7/9. Will discuss need for bronchoscopy at that time.    Dorn Chill, MD  [TY]  [phone] DG Chest Portable 1 View IMPRESSION: 1. New blunting of the right costophrenic angle compatible with small right pleural effusion. 2. Stable cardiomegaly. 3. Stable areas of subsegmental scarring versus atelectasis within the right mid and left mid lung zone. 4. Asymmetric increase interstitial markings are noted within the left lung which correlate with the findings from the high-resolution CT of  the chest from 06/24/2024. This is a nonspecific finding and may be inflammatory or infectious in etiology. Underlying interstitial lung disease not excluded. See high-resolution CT report from 06/24/2024 for further details.   Electronically Signed   By: Waddell Calk M.D.   On: 07/02/2024 11:28   [TY]    Clinical Course User Index [TY] Neysa Caron PARAS, DO                                 Medical Decision Making 68 year old female with COPD on 3 L nasal cannula presenting emergency department for shortness of breath.  She was hypoxic in triage down to 80%, however improved at rest.  Maintaining saturation in the mid 90s on her home 3 L.  Poor air movement and wheezing on exam.  Chest x-ray with similar findings to recent CT scan.  Questionable pneumonia.  Minor leukocytosis, could be infectious, however also could be reactionary to recent steroid use.  To take steroids today, therefore did not give oral steroids here in the emergency department.  Given breathing treatment.  She does have anemia which is somewhat worsened than prior.  No significant metabolic derangements.  BNP not elevated.  Acute decompensated heart failure less likely.  Flu/COVID/RSV negative.  Given patient's desaturation with ambulation on her baseline oxygen  will admit for COPD exacerbation.  Amount and/or Complexity of Data Reviewed Labs: ordered. Radiology: ordered. Decision-making details documented in ED Course. ECG/medicine tests: ordered.  Risk Prescription drug  management. Decision regarding hospitalization.      Final diagnoses:  COPD exacerbation [facility])    ED Discharge Orders     None          Neysa Caron PARAS, DO 07/02/24 1529

## 2024-07-02 NOTE — ED Triage Notes (Signed)
 Here by POV from home for sob, respiratory distress, no relief with increasing O2 and home meds. Cannot get nebs/ inhalers to go in. Speaking in one word statements. SPo2 in low 80s on arrival on 5LPM. Being treated for PNA. Denies pain, fever, NVD. Endorses sob, non-productive cough, wheezing. Pedal edema noted. RT and EDP notified. RT present.

## 2024-07-03 DIAGNOSIS — J849 Interstitial pulmonary disease, unspecified: Secondary | ICD-10-CM | POA: Diagnosis not present

## 2024-07-03 DIAGNOSIS — J479 Bronchiectasis, uncomplicated: Secondary | ICD-10-CM

## 2024-07-03 DIAGNOSIS — J441 Chronic obstructive pulmonary disease with (acute) exacerbation: Secondary | ICD-10-CM | POA: Diagnosis not present

## 2024-07-03 LAB — BASIC METABOLIC PANEL WITH GFR
Anion gap: 13 (ref 5–15)
BUN: 19 mg/dL (ref 8–23)
CO2: 24 mmol/L (ref 22–32)
Calcium: 9.1 mg/dL (ref 8.9–10.3)
Chloride: 96 mmol/L — ABNORMAL LOW (ref 98–111)
Creatinine, Ser: 0.92 mg/dL (ref 0.44–1.00)
GFR, Estimated: >60 mL/min (ref >60)
Glucose, Bld: 111 mg/dL — ABNORMAL HIGH (ref 70–99)
Potassium: 3.7 mmol/L (ref 3.5–5.1)
Sodium: 133 mmol/L — ABNORMAL LOW (ref 135–145)

## 2024-07-03 LAB — CBC
HCT: 26.3 % — ABNORMAL LOW (ref 36.0–46.0)
Hemoglobin: 7.9 g/dL — ABNORMAL LOW (ref 12.0–15.0)
MCH: 23.9 pg — ABNORMAL LOW (ref 26.0–34.0)
MCHC: 30 g/dL (ref 30.0–36.0)
MCV: 79.7 fL — ABNORMAL LOW (ref 80.0–100.0)
Platelets: 426 K/uL — ABNORMAL HIGH (ref 150–400)
RBC: 3.3 MIL/uL — ABNORMAL LOW (ref 3.87–5.11)
RDW: 18 % — ABNORMAL HIGH (ref 11.5–15.5)
WBC: 13.3 K/uL — ABNORMAL HIGH (ref 4.0–10.5)
nRBC: 0 % (ref 0.0–0.2)

## 2024-07-03 LAB — RESPIRATORY PANEL BY PCR

## 2024-07-03 LAB — PROCALCITONIN: Procalcitonin: <0.1 ng/mL

## 2024-07-03 MED ORDER — PREDNISONE 20 MG PO TABS: 30.0000 mg | ORAL_TABLET | Freq: Every day | ORAL | Status: DC

## 2024-07-03 MED ORDER — ENSIFENTRINE 3 MG/2.5ML IN SUSP: 3.0000 mg | Freq: Two times a day (BID) | RESPIRATORY_TRACT | Status: DC

## 2024-07-03 MED ORDER — METHYLPREDNISOLONE SODIUM SUCC 125 MG IJ SOLR
125.0000 mg | Freq: Two times a day (BID) | INTRAMUSCULAR | Status: DC
  Administered 2024-07-03 – 2024-07-04 (×2): 125 mg via INTRAVENOUS
  Filled 2024-07-03 (×2): qty 2

## 2024-07-03 MED ORDER — BUDESON-GLYCOPYRROL-FORMOTEROL 160-9-4.8 MCG/ACT IN AERO
2.0000 | INHALATION_SPRAY | Freq: Two times a day (BID) | RESPIRATORY_TRACT | Status: DC
  Filled 2024-07-03: qty 5.9

## 2024-07-03 MED ORDER — SODIUM CHLORIDE 3 % IN NEBU: 4.0000 mL | INHALATION_SOLUTION | RESPIRATORY_TRACT | Status: DC | PRN

## 2024-07-03 MED ORDER — REVEFENACIN 175 MCG/3ML IN SOLN
175.0000 ug | Freq: Every day | RESPIRATORY_TRACT | Status: DC
  Administered 2024-07-04: 175 ug via RESPIRATORY_TRACT
  Filled 2024-07-03: qty 3

## 2024-07-03 MED ORDER — BUDESONIDE 0.5 MG/2ML IN SUSP
0.5000 mg | Freq: Two times a day (BID) | RESPIRATORY_TRACT | Status: DC
  Administered 2024-07-03 – 2024-07-04 (×2): 0.5 mg via RESPIRATORY_TRACT
  Filled 2024-07-03 (×2): qty 2

## 2024-07-03 MED ORDER — METOPROLOL TARTRATE 12.5 MG HALF TABLET
12.5000 mg | ORAL_TABLET | Freq: Two times a day (BID) | ORAL | Status: DC
  Administered 2024-07-03 – 2024-07-04 (×3): 12.5 mg via ORAL
  Filled 2024-07-03 (×3): qty 1

## 2024-07-03 MED ORDER — PREDNISONE 20 MG PO TABS: 40.0000 mg | ORAL_TABLET | Freq: Every day | ORAL | Status: DC

## 2024-07-03 MED ORDER — ARFORMOTEROL TARTRATE 15 MCG/2ML IN NEBU
15.0000 ug | INHALATION_SOLUTION | Freq: Two times a day (BID) | RESPIRATORY_TRACT | Status: DC
  Administered 2024-07-03 – 2024-07-04 (×2): 15 ug via RESPIRATORY_TRACT
  Filled 2024-07-03 (×2): qty 2

## 2024-07-03 NOTE — Plan of Care (Signed)

## 2024-07-03 NOTE — Hospital Course (Signed)
 68yo with h/o HTN and COPD on chronic 3L [place] O2 who presented on 7/5 with worsening wheezing and SOB.  She was seen by pulmonology (Dr. Kara) on 6/16 and had HR-CT on 6/27 with multifocal consolidation with bronchiectasis; she was started on steroid and Bactrim  for PJP prophylaxis.  Given acute onset of symptoms in the 24 hours prior to presentation, she was monitored overnight.

## 2024-07-03 NOTE — Consult Note (Addendum)
 NAME:  Breanna Ramirez, MRN:  [id number], DOB:  [birth year], LOS: 0 ADMISSION DATE:  07/02/2024, CONSULTATION DATE: 05/03/2024 REFERRING MD: Delon Herald, MD, CHIEF COMPLAINT: COPD, bronchiectasis exacerbation  History of Present Illness:  Patient with emphysema, chronic respiratory failure on home oxygen , presents with recurrent shortness of breath, coughing, and wheezing.  She follows with Dr. Kara  Exacerbation history and hospitalizations - She has had multiple hospitalizations this year: March, May, for similar presentation with acute hypoxic respiratory failure with pulmonary infiltrates.  Treated with antibiotics, steroids.  Underwent a bronchoscopy with BAL in the right middle lobe and lingula during May hospitalization with findings of thick tenacious mucus and lymphocytosis on BAL, negative cultures and cytology.  She had a high-res CT on 6/27 and was placed on a prolonged taper of prednisone  starting at 40 mg and Bactrim  for prophylaxis with plans for discussion of bronchoscopy with biopsy.  She started the medication yesterday however symptoms worsened necessitating hospital admission.  Chronic obstructive pulmonary disease and emphysema - Diagnosed with COPD and emphysema - Though lung function tests reveal restriction and diffusion impairment - On home oxygen  therapy for 3-4 years, typically at 3 L/min, recently increased to 4 L/min due to worsening symptoms - Oxygen  saturation at home in the low eighties, prompting increase in oxygen  flow - Current medications: Trelegy, Ohtuvayre  nebulizer, recently started Daliresp , Singulair   Systemic and extrapulmonary symptoms - No joint pains, rash, stiffness, difficulty swallowing, dry mouth, or dry eyes  Tobacco use - Significant smoking history: 1 to 1.5 packs per day for 40 years - Quit smoking in 2015  Occupational and environmental exposures - Works at [employer]; on medical leave for over two months -  Potential exposure to fabric dust and lint in the building, though not directly in work area - Lives in an apartment with no pets, mold issues, or significant environmental exposures at home - Recent exposure to mulch near apartment, but no direct contact  Pertinent  Medical History    has a past medical history of COPD (chronic obstructive pulmonary disease) (HCC), Essential hypertension, GERD (gastroesophageal reflux disease), Pneumonia (05/2017), and Respiratory failure with hypoxia (HCC) (05/2017).   Significant Hospital Events: Including procedures, antibiotic start and stop dates in addition to other pertinent events     Interim History / Subjective:    Objective    Blood pressure (!) 114/56, pulse 70, temperature 98.2 F (36.8 C), temperature source Oral, resp. rate 18, weight 55.8 kg, SpO2 96%.    FiO2 (%):  [36 %] 36 %   Intake/Output Summary (Last 24 hours) at 07/03/2024 1603 Last data filed at 07/03/2024 0845 Gross per 24 hour  Intake 240 ml  Output --  Net 240 ml   Filed Weights   07/02/24 1025  Weight: 55.8 kg    Examination: Blood pressure (!) 114/56, pulse 70, temperature 98.2 F (36.8 C), temperature source Oral, resp. rate 18, weight 55.8 kg, SpO2 96%. Gen:      No acute distress HEENT:  EOMI, sclera anicteric Neck:     No masses; no thyromegaly Lungs:    Bilateral expiratory wheeze CV:         Regular rate and rhythm; no murmurs Abd:      + bowel sounds; soft, non-tender; no palpable masses, no distension Ext:    No edema; adequate peripheral perfusion Neuro: alert and oriented x 3 Psych: normal mood and affect   Lab/imaging reviewed. Significant for  CT high resolution on 06/29/2024  with emphysema multifocal areas of consolidation, bronchiectasis groundglass mosaic attenuation in alternate pattern  Chest x-ray 07/02/2024 with bilateral lung opacities, interstitial markings, blunting of right costophrenic angle  PFTs on 04/2012 with moderate obstruction,  diffusion defect CTD serologies, hypersensitivity panel 05/04/2024 with borderline positive ANA 1: 40, nuclear speckled  Resolved problem list   Assessment and Plan  Chronic obstructive pulmonary disease (COPD) with emphysema Lung function tests do not show typical obstruction. On multiple medications including Trelegy, Ohtuvayre  nebulizer, and Singulair . On oxygen  therapy for 3-4 years, recently increased to 4 liters due to worsening symptoms. Smoking cessation in 2015. - Change bronchodilator therapy to Brovana , Pulmicort , Yupelri .  Continue Singulair  - Continue oxygen  therapy at 4 liters as needed  Interstitial lung disease with bronchiectasis Recent CT scan and bronchoscopy with lavage showed air trapping, waxing and waning increased lymphocytes, suggesting chronic hypersensitivity pneumonitis though she does not give significant exposures except for working in [employer] and hypersensitivity panel in the past was negative.  Negative CTD serologies and no symptoms of autoimmune disease. - Consider bronchoscopy with transbronchial biopsy to obtain lung tissue samples as being planned by Dr. Kara though the yield may be low - Discussed the possibility of surgical lung biopsy for definitive diagnosis but patient does not want to go through with that yet - Administer high-dose IV steroids to manage inflammation - Check procalcitonin levels, respiratory virus panel to assess for pneumonia.  Okay to observe off infection for now as suspicion for pneumonia is low - Repeat autoimmune labs and myositis panel  Best Practice (right click and Reselect all SmartList Selections daily)   Per primary team  Signature:   Shereka Lafortune MD [facility] See Amion for pager  If no response to pager , please call [phone] until 7pm After 7:00 pm call Elink  [phone] 07/03/2024, 5:27 PM

## 2024-07-03 NOTE — Progress Notes (Signed)
 Progress Note   Patient: Breanna Ramirez FMW:[id number] DOB: 05-Jul-1956 DOA: 07/02/2024     0 DOS: the patient was seen and examined on 07/03/2024   Brief hospital course: 67yo with h/o HTN and COPD on chronic 3L [place] O2 who presented on 7/5 with worsening wheezing and SOB.  She was seen by pulmonology (Dr. Kara) on 6/16 and had HR-CT on 6/27 with multifocal consolidation with bronchiectasis; she was started on steroid and Bactrim  for PJP prophylaxis.  Given acute onset of symptoms in the 24 hours prior to presentation, she was monitored overnight.    Assessment and Plan:  Progressive dyspnea Chronic hypoxic respiratory failure on nocturnal oxygen  Patient endorses progressive worsening dyspnea especially on exertion She has baseline IPF as well as COPD She reports use of home O2, O2 sats are stable CXR with new small R pleural effusion and nonspecific findings c/w recent HR-CT High res CT on 6/27 with multifocal consolidation with bronchiectasis, mosaic GGO, and interlobular septal thickening for which she was started on prednisone  and Bactirm Consult pulmonology (sees Dr. Kara as an outpatient, will need outpatient f/u) to ensure that no additional treatment is needed Observed overnight, appears to be generally stable Continue albuterol  nebs, hypertonic saline nebs Continue Ensifentrine , Trelegy (Breztri  per formulary), Singulair  Continue home O2 Ambulatory HR monitoring requested from nursing staff - HR high as 127 but consistently maintained around 118-117 while ambulating. Oxygen  maintained in the mid to upper 90s on 4L [place].    Chronic diastolic CHF  Appears compensated Echo with preserved EF and grade 1 diastolic dysfunction Continue home Lasix    Essential hypertension Change hydrochlorothiazide  to metoprolol  to see if this controls her tachycardia   Acute on chronic anemia H/o GI bleeding and IDA No evidence of bleeding Hemoglobin 7.9, appears to be stable      Dyslipidemia Continue simvastatin    GERD Continue PPI         Consultants: Pulmonology   Procedures: None   Antibiotics: Bactrim  (home/chronic medication)  30 Day Unplanned Readmission Risk Score    Flowsheet Row ED to Hosp-Admission (Discharged) from [facility]/15/2024 in [facility]  30 Day Unplanned Readmission Risk Score (%) 11.75 Filed at 05/18/2024 1200    This score is the patient's risk of an unplanned readmission within 30 days of being discharged (0 -100%). The score is based on dignosis, age, lab data, medications, orders, and past utilization.   Low:  0-14.9   Medium: 15-21.9   High: 22-29.9   Extreme: 30 and above           Subjective: She reports that her breathing is not the issue. She develops a racing heart rate with tachycardia with ambulation and believes this is related to her heart rather than her lungs. Normal EKG this AM per nursing.    Objective: Vitals:   07/03/24 1455 07/03/24 1511  BP:  (!) 114/56  Pulse: 100 70  Resp:    Temp:    SpO2: 96%     Intake/Output Summary (Last 24 hours) at 07/03/2024 1512 Last data filed at 07/03/2024 0845 Gross per 24 hour  Intake 240 ml  Output --  Net 240 ml   Filed Weights   07/02/24 1025  Weight: 55.8 kg    Exam:  General:  Appears calm and comfortable and is in NAD, on [place] O2 Eyes:  normal lids, iris ENT:  grossly normal hearing, lips & tongue, mmm Cardiovascular:  RRR, periodic tachycardia. No LE edema.  Respiratory:   CTA bilaterally with expiratory wheezes.  Normal respiratory effort. Abdomen:  soft, NT, ND Skin:  no rash or induration seen on limited exam Musculoskeletal:  grossly normal tone BUE/BLE, good ROM, no bony abnormality Psychiatric:  anxious mood and affect, speech fluent and appropriate, AOx3 Neurologic:  CN 2-12 grossly intact, moves all extremities in coordinated fashion  Data Reviewed: I have reviewed the patient's lab results since  admission.  Pertinent labs for today include:   VBG: 7.5/36/60/28.1 Na++ 133 Glucose 111 WBC 13.3 Hgb 7.9, stable     Family Communication: None present  Disposition: Status is: Observation The patient remains OBS appropriate and will d/c before 2 midnights.     Time spent: 50 minutes  Unresulted Labs (From admission, onward)     Start     Ordered   07/04/24 0500  CBC with Differential/Platelet  Tomorrow morning,   R        07/03/24 1512   07/04/24 0500  Basic metabolic panel with GFR  Tomorrow morning,   R        07/03/24 1512   07/04/24 0500  TSH  Tomorrow morning,   R        07/03/24 1512             Author: Delon Herald, MD 07/03/2024 3:12 PM  For on call review [email or web address].

## 2024-07-04 ENCOUNTER — Other Ambulatory Visit (HOSPITAL_COMMUNITY): Payer: Self-pay

## 2024-07-04 ENCOUNTER — Telehealth (HOSPITAL_COMMUNITY): Payer: Self-pay | Admitting: Pharmacy Technician

## 2024-07-04 ENCOUNTER — Other Ambulatory Visit: Payer: Self-pay

## 2024-07-04 DIAGNOSIS — J441 Chronic obstructive pulmonary disease with (acute) exacerbation: Secondary | ICD-10-CM | POA: Diagnosis not present

## 2024-07-04 DIAGNOSIS — J849 Interstitial pulmonary disease, unspecified: Secondary | ICD-10-CM | POA: Diagnosis not present

## 2024-07-04 LAB — CBC WITH DIFFERENTIAL/PLATELET
Abs Immature Granulocytes: 0.07 K/uL (ref 0.00–0.07)
Basophils Absolute: 0 K/uL (ref 0.0–0.1)
Basophils Relative: 0 %
Eosinophils Absolute: 0 K/uL (ref 0.0–0.5)
Eosinophils Relative: 0 %
HCT: 27.2 % — ABNORMAL LOW (ref 36.0–46.0)
Hemoglobin: 7.8 g/dL — ABNORMAL LOW (ref 12.0–15.0)
Immature Granulocytes: 1 %
Lymphocytes Relative: 4 %
Lymphs Abs: 0.4 K/uL — ABNORMAL LOW (ref 0.7–4.0)
MCH: 23.6 pg — ABNORMAL LOW (ref 26.0–34.0)
MCHC: 28.7 g/dL — ABNORMAL LOW (ref 30.0–36.0)
MCV: 82.4 fL (ref 80.0–100.0)
Monocytes Absolute: 0.6 K/uL (ref 0.1–1.0)
Monocytes Relative: 6 %
Neutro Abs: 8.6 K/uL — ABNORMAL HIGH (ref 1.7–7.7)
Neutrophils Relative %: 89 %
Platelets: 479 K/uL — ABNORMAL HIGH (ref 150–400)
RBC: 3.3 MIL/uL — ABNORMAL LOW (ref 3.87–5.11)
RDW: 17.6 % — ABNORMAL HIGH (ref 11.5–15.5)
WBC: 9.7 K/uL (ref 4.0–10.5)
nRBC: 0 % (ref 0.0–0.2)

## 2024-07-04 LAB — TSH: TSH: 0.484 u[IU]/mL (ref 0.350–4.500)

## 2024-07-04 LAB — BASIC METABOLIC PANEL WITH GFR
Anion gap: 9 (ref 5–15)
BUN: 26 mg/dL — ABNORMAL HIGH (ref 8–23)
CO2: 28 mmol/L (ref 22–32)
Calcium: 9.8 mg/dL (ref 8.9–10.3)
Chloride: 97 mmol/L — ABNORMAL LOW (ref 98–111)
Creatinine, Ser: 0.85 mg/dL (ref 0.44–1.00)
GFR, Estimated: >60 mL/min (ref >60)
Glucose, Bld: 122 mg/dL — ABNORMAL HIGH (ref 70–99)
Potassium: 4.4 mmol/L (ref 3.5–5.1)
Sodium: 134 mmol/L — ABNORMAL LOW (ref 135–145)

## 2024-07-04 MED ORDER — METOPROLOL TARTRATE 25 MG PO TABS
12.5000 mg | ORAL_TABLET | Freq: Two times a day (BID) | ORAL | 0 refills | Status: AC
  Filled 2024-07-04: qty 30, 30d supply, fill #0

## 2024-07-04 MED ORDER — ALBUTEROL SULFATE (2.5 MG/3ML) 0.083% IN NEBU
2.5000 mg | INHALATION_SOLUTION | RESPIRATORY_TRACT | 0 refills | Status: AC | PRN
Start: 2024-07-04 — End: ?
  Filled 2024-07-04: qty 75, 3d supply, fill #0

## 2024-07-04 MED ORDER — REVEFENACIN 175 MCG/3ML IN SOLN
175.0000 ug | Freq: Every day | RESPIRATORY_TRACT | 0 refills | Status: DC
  Filled 2024-07-04: qty 90, 30d supply, fill #0

## 2024-07-04 MED ORDER — BUDESONIDE 0.5 MG/2ML IN SUSP
0.5000 mg | Freq: Two times a day (BID) | RESPIRATORY_TRACT | 0 refills | Status: DC
  Filled 2024-07-04: qty 120, 30d supply, fill #0

## 2024-07-04 MED ORDER — ARFORMOTEROL TARTRATE 15 MCG/2ML IN NEBU
15.0000 ug | INHALATION_SOLUTION | Freq: Two times a day (BID) | RESPIRATORY_TRACT | 0 refills | Status: DC
  Filled 2024-07-04: qty 120, 30d supply, fill #0

## 2024-07-04 NOTE — Telephone Encounter (Signed)
 Pharmacy Patient Advocate Encounter   Received notification from Inpatient Request that prior authorization for Yupelri  175MCG/3ML solution is required/requested.   Insurance verification completed.   The patient is insured through [facility] .   Per test claim: PA required; PA submitted to above mentioned insurance via CoverMyMeds Key/confirmation #/EOC [id number] Status is pending

## 2024-07-04 NOTE — Discharge Summary (Signed)
 Physician Discharge Summary   Patient: Breanna Ramirez MRN: [id number] DOB: 1956-04-18  Admit date:     07/02/2024  Discharge date: 07/04/24  Discharge Physician: Delon Herald   PCP: Austin Mutton, MD   Recommendations at discharge:   Continue home oxygen , 4L  Blood pressure medication was changed to metoprolol  12.5 mg twice daily Breathing medications were changed to Brovana , Pulmicort , and Yupelri  Continue Singulair , Ensifentrine  Take prednisone  taper (prescribed by Dr. Kara prior to admission) Take Bactrim  every Mon-Wed-Fri (prescribed by Dr. Kara prior to admission) Follow up at pulmonology office on 7/9 as scheduled Follow up with Dr. Austin in 1-2 weeks; call for an appointment  Discharge Diagnoses: Principal Problem:   COPD with acute exacerbation [facility]) Active Problems:   Essential hypertension, benign   Chronic hypoxic respiratory failure, on home oxygen  therapy (HCC)   IDA (iron  deficiency anemia)   Hyperlipidemia   Dyspnea on exertion   Chronic diastolic CHF (congestive heart failure) [facility])    Hospital Course: [phone] with h/o HTN and COPD on chronic 3L [place] O2 who presented on 7/5 with worsening wheezing and SOB.  She was seen by pulmonology (Dr. Kara) on 6/16 and had HR-CT on 6/27 with multifocal consolidation with bronchiectasis; she was started on steroid and Bactrim  for PJP prophylaxis.  Given acute onset of symptoms in the 24 hours prior to presentation, she was monitored overnight.    Assessment and Plan:  Progressive dyspnea Chronic hypoxic respiratory failure on nocturnal oxygen  Patient endorses progressive worsening dyspnea especially on exertion She has baseline IPF as well as COPD She reports use of home O2, O2 sats are stable CXR with new small R pleural effusion and nonspecific findings c/w recent HR-CT High res CT on 6/27 with multifocal consolidation with bronchiectasis, mosaic GGO, and interlobular septal thickening for which she was started on  prednisone  and Bactirm Consult pulmonology (sees Dr. Kara as an outpatient, will need outpatient f/u) to ensure that no additional treatment is needed Observed overnight, appears to be generally stable Continue albuterol  nebs, hypertonic saline nebs Seen by pulmonology, changed to Brovana , Pulmicort , Yupelri  Continue Singulair  Continue home O2, 4L Changed to high-dose IV steroids to manage inflammation -> resume prednisone  taper as previously prescribed Ambulatory HR monitoring requested from nursing staff - HR high as 127 but consistently maintained around 118-117 while ambulating; changed to metoprolol  with resolution Will need close outpatient pulmonology f/u Possible need for bronchoscopy with transbronchial biopsy in the future (patient does not desire this currently)   Chronic diastolic CHF  Appears compensated Echo with preserved EF and grade 1 diastolic dysfunction Continue home Lasix    Essential hypertension Change hydrochlorothiazide  to metoprolol  to see if this controls her tachycardia HR is 55 this AM She feels much better and wants to go home   Acute on chronic anemia H/o GI bleeding and IDA No evidence of bleeding Hemoglobin 7.9, appears to be stable     Dyslipidemia Continue simvastatin    GERD Continue PPI         Consultants: Pulmonology   Procedures: None   Antibiotics: Bactrim  (home/chronic medication)  Pain control - [place]  Controlled Substance Reporting System database was reviewed. and patient was instructed, not to drive, operate heavy machinery, perform activities at heights, swimming or participation in water activities or provide baby-sitting services while on Pain, Sleep and Anxiety Medications; until their outpatient Physician has advised to do so again. Also recommended to not to take more than prescribed Pain, Sleep and Anxiety Medications.  Disposition: Home Diet recommendation:  Cardiac diet DISCHARGE MEDICATION: Allergies  as of 07/04/2024       Reactions   Codeine Nausea And Vomiting   Daliresp  [roflumilast ] Other (See Comments)   Severe nose bleeds        Medication List     STOP taking these medications    hydrochlorothiazide  25 MG tablet Commonly known as: HYDRODIURIL    roflumilast  500 MCG Tabs tablet Commonly known as: DALIRESP    Trelegy Ellipta  200-62.5-25 MCG/ACT Aepb Generic drug: Fluticasone -Umeclidin-Vilant       TAKE these medications    albuterol  108 (90 Base) MCG/ACT inhaler Commonly known as: VENTOLIN  HFA Inhale 2 puffs into the lungs every 6 (six) hours as needed for wheezing or shortness of breath. What changed: when to take this   albuterol  (2.5 MG/3ML) 0.083% nebulizer solution Commonly known as: PROVENTIL  Take 3 mLs (2.5 mg total) by nebulization every 2 (two) hours as needed for wheezing. What changed: You were already taking a medication with the same name, and this prescription was added. Make sure you understand how and when to take each.   arformoterol  15 MCG/2ML Nebu Commonly known as: BROVANA  Take 2 mLs (15 mcg total) by nebulization 2 (two) times daily.   BC HEADACHE POWDER PO Take 1 packet by mouth daily as needed (for headaches or back pain).   budesonide  0.5 MG/2ML nebulizer solution Commonly known as: PULMICORT  Take 2 mLs (0.5 mg total) by nebulization 2 (two) times daily.   cholecalciferol 25 MCG (1000 UNIT) tablet Commonly known as: VITAMIN D3 Take 1,000 Units by mouth every other day.   esomeprazole  40 MG capsule Commonly known as: NEXIUM  Take 1 capsule (40 mg total) by mouth daily. What changed: when to take this   fluticasone  50 MCG/ACT nasal spray Commonly known as: FLONASE  Place 1 spray into both nostrils every evening. What changed:  when to take this reasons to take this   furosemide  20 MG tablet Commonly known as: Lasix  Take 1 tablet (20 mg total) by mouth daily. What changed:  when to take this reasons to take this    ibuprofen 200 MG tablet Commonly known as: ADVIL Take 400 mg by mouth every 6 (six) hours as needed for mild pain (pain score 1-3) or headache.   metoprolol  tartrate 25 MG tablet Commonly known as: LOPRESSOR  Take 0.5 tablets (12.5 mg total) by mouth 2 (two) times daily.   montelukast  10 MG tablet Commonly known as: SINGULAIR  Take 1 tablet (10 mg total) by mouth at bedtime.   Ohtuvayre  3 MG/2.5ML Susp Generic drug: Ensifentrine  Inhale 1 Inhalation into the lungs 2 (two) times daily. What changed: how much to take   OXYGEN  Inhale 3 L/min into the lungs continuous.   predniSONE  10 MG tablet Commonly known as: DELTASONE  Take 4 tablets (40 mg total) by mouth daily with breakfast for 14 days, THEN 3 tablets (30 mg total) daily with breakfast for 14 days. Start taking on: June 30, 2024   revefenacin  175 MCG/3ML nebulizer solution Commonly known as: YUPELRI  Take 3 mLs (175 mcg total) by nebulization daily. Start taking on: July 05, 2024   simvastatin  40 MG tablet Commonly known as: ZOCOR  Take 1 tablet (40 mg total) by mouth at bedtime.   sodium chloride  HYPERTONIC 3 % nebulizer solution Take by nebulization as needed for other. What changed:  how much to take reasons to take this   sulfamethoxazole -trimethoprim  800-160 MG tablet Commonly known as: BACTRIM  DS Take 1 tablet by mouth 3 (  three) times a week.               Durable Medical Equipment  (From admission, onward)           Start     Ordered   07/04/24 0000  For home use only DME Nebulizer machine       Question Answer Comment  Patient needs a nebulizer to treat with the following condition COPD (chronic obstructive pulmonary disease) (HCC)   Length of Need Lifetime   Additional equipment included Administration kit      07/04/24 1038            Follow-up Information     Kara Dorn NOVAK, MD. Go in 2 day(s).   Specialty: Pulmonary Disease Contact information: [address] [place] [address] [phone]                Discharge Exam:    Subjective: Feeling much better today.  HR is controlled, breathing is improved, wants to go home.   Objective: Vitals:   07/04/24 0900 07/04/24 0953  BP:  107/61  Pulse:  67  Resp:  18  Temp:  97.8 F (36.6 C)  SpO2: 96% 100%    Intake/Output Summary (Last 24 hours) at 07/04/2024 1038 Last data filed at 07/03/2024 1300 Gross per 24 hour  Intake 240 ml  Output --  Net 240 ml   Filed Weights   07/02/24 1025  Weight: 55.8 kg    Exam:  General:  Appears calm and comfortable and is in NAD, on Lupus O2 Eyes:  normal lids, iris ENT:  grossly normal hearing, lips & tongue, mmm Cardiovascular:  RRR. No LE edema.  Respiratory:   CTA bilaterally with expiratory wheezes.  Normal respiratory effort. Abdomen:  soft, NT, ND Skin:  no rash or induration seen on limited exam Musculoskeletal:  grossly normal tone BUE/BLE, good ROM, no bony abnormality Psychiatric:  anxious mood and affect, speech fluent and appropriate, AOx3 Neurologic:  CN 2-12 grossly intact, moves all extremities in coordinated fashion   Data Reviewed: I have reviewed the patient's lab results since admission.  Pertinent labs for today include:  Na++ 134, stable and not clinically significant Glucose 122 WBC 9.7 Hgb 7.8, stable Procalcitonin <0.10 TSH 0.484 RVP negative    Condition at discharge: improving  The results of significant diagnostics from this hospitalization (including imaging, microbiology, ancillary and laboratory) are listed below for reference.   Imaging Studies: DG Chest Portable 1 View Result Date: 07/02/2024 CLINICAL DATA:  Shortness of breath EXAM: PORTABLE CHEST 1 VIEW COMPARISON:  06/18/2024 FINDINGS: Cardiac enlargement is stable. Aortic atherosclerotic calcifications. Right mid and left mid lung zone areas of subsegmental scarring versus atelectasis appears similar to the previous exam. Bandlike area of  atelectasis within the right lung base is M proved in the interval. There is new blunting of the right costophrenic angle. Increased interstitial markings within the left lung are unchanged from the prior exam. No focal consolidative change identified. IMPRESSION: 1. New blunting of the right costophrenic angle compatible with small right pleural effusion. 2. Stable cardiomegaly. 3. Stable areas of subsegmental scarring versus atelectasis within the right mid and left mid lung zone. 4. Asymmetric increase interstitial markings are noted within the left lung which correlate with the findings from the high-resolution CT of the chest from 06/24/2024. This is a nonspecific finding and may be inflammatory or infectious in etiology. Underlying interstitial lung disease not excluded. See high-resolution CT  report from 06/24/2024 for further details. Electronically Signed   By: Waddell Calk M.D.   On: 07/02/2024 11:28   CT CHEST HIGH RESOLUTION Result Date: 06/29/2024 CLINICAL DATA:  Evaluate for interstitial lung disease, multifocal pneumonia and shortness of breath EXAM: CT CHEST WITHOUT CONTRAST TECHNIQUE: Multidetector CT imaging of the chest was performed following the standard protocol without intravenous contrast. High resolution imaging of the lungs, as well as inspiratory and expiratory imaging, was performed. RADIATION DOSE REDUCTION: This exam was performed according to the departmental dose-optimization program which includes automated exposure control, adjustment of the mA and/or kV according to patient size and/or use of iterative reconstruction technique. COMPARISON:  CT angio chest May 15, 2024 FINDINGS: Cardiovascular: The heart size is normal. Atherosclerotic calcifications of the aorta and coronary arteries. No pericardial fluid. Mediastinum/Nodes: Multi station subcentimeter and pericentimeter mediastinal lymph nodes. Thyroid  gland, trachea, and esophagus demonstrate no significant findings.  Lungs/Pleura: Bilateral upper lobe predominant centrilobular emphysematous changes. Diffuse interlobular septal thickening. Multifocal areas of reticular scarring/platelike atelectasis and subsegmental consolidation with associated bronchiectasis and bronchiolectasis involving the right middle and right lower lobes, are grossly stable to prior. Interval increase in left lower lobe bandlike scarring /platelike atelectasis (4/124). Previously seen lingular consolidation is now near completely resolved. There is mosaic ground-glass attenuation accentuated on expiratory phase suggestive of air trapping and small airway disease. Bronchial and bronchiolar wall thickening. Scattered pulmonary micronodules are stable to prior for example left upper lobe (4/39, anterior aspect of right upper lobe (4/61, right lower lobe 4/118, left lower lobe 4/107 No new nodules.  No pleural effusion. Upper Abdomen: Query left kidney upper pole hypodense lesion incompletely assessed on current exam likely a simple cortical cyst which does not require imaging follow-up Musculoskeletal: No suspicious osseous lesion. IMPRESSION: Multifocal consolidation/foci of atelectasis with associated bronchiectasis and bronchiolectasis, mosaic ground-glass attenuation and interlobular septal thickening. Findings can be seen the setting of multifocal infection/inflammation ( organizing pneumonias), chronic hypersensitivity pneumonitis, fibrotic NSIP, and sarcoidosis. Correlate with clinical findings and follow-up to ensure stability/resolution. Scattered pulmonary micro nodules are stable to prior. No new nodules. Follow-up with chest CT in 1 year to ensure stability. Aortic Atherosclerosis (ICD10-I70.0) and Emphysema (ICD10-J43.9). Electronically Signed   By: Megan  Zare M.D.   On: 06/29/2024 20:01   DG Chest Port 1 View Result Date: 06/18/2024 CLINICAL DATA:  Cough EXAM: PORTABLE CHEST 1 VIEW COMPARISON:  03/14/2024 FINDINGS: Stable pulmonary  hyperinflation in keeping with changes of underlying COP stable right basilar parenchymal scarring. Lungs are otherwise clear. No pneumothorax or pleural effusion. Cardiac size within normal limits. Right vascularity is normal. No acute bone abnormality IMPRESSION: 1. No active disease. 2. COPD. Electronically Signed   By: Dorethia Molt M.D.   On: 06/18/2024 23:39    Microbiology: Results for orders placed or performed during the hospital encounter of 07/02/24  Resp panel by RT-PCR (RSV, Flu A&B, Covid) Anterior Nasal Swab     Status: None   Collection Time: 07/02/24  1:54 PM   Specimen: Anterior Nasal Swab  Result Value Ref Range Status   SARS Coronavirus 2 by RT PCR NEGATIVE NEGATIVE Final    Comment: (NOTE) SARS-CoV-2 target nucleic acids are NOT DETECTED.  The SARS-CoV-2 RNA is generally detectable in upper respiratory specimens during the acute phase of infection. The lowest concentration of SARS-CoV-2 viral copies this assay can detect is 138 copies/mL. A negative result does not preclude SARS-Cov-2 infection and should not be used as the sole basis for treatment or  other patient management decisions. A negative result may occur with  improper specimen collection/handling, submission of specimen other than nasopharyngeal swab, presence of viral mutation(s) within the areas targeted by this assay, and inadequate number of viral copies(<138 copies/mL). A negative result must be combined with clinical observations, patient history, and epidemiological information. The expected result is Negative.  Fact Sheet for Patients:  BloggerCourse.com  Fact Sheet for Healthcare Providers:  SeriousBroker.it  This test is no t yet approved or cleared by the [place]  FDA and  has been authorized for detection and/or diagnosis of SARS-CoV-2 by FDA under an Emergency Use Authorization (EUA). This EUA will remain  in effect (meaning this  test can be used) for the duration of the COVID-19 declaration under Section 564(b)(1) of the Act, 21 U.S.C.section 360bbb-3(b)(1), unless the authorization is terminated  or revoked sooner.       Influenza A by PCR NEGATIVE NEGATIVE Final   Influenza B by PCR NEGATIVE NEGATIVE Final    Comment: (NOTE) The Xpert Xpress SARS-CoV-2/FLU/RSV plus assay is intended as an aid in the diagnosis of influenza from Nasopharyngeal swab specimens and should not be used as a sole basis for treatment. Nasal washings and aspirates are unacceptable for Xpert Xpress SARS-CoV-2/FLU/RSV testing.  Fact Sheet for Patients: BloggerCourse.com  Fact Sheet for Healthcare Providers: SeriousBroker.it  This test is not yet approved or cleared by the [place]  FDA and has been authorized for detection and/or diagnosis of SARS-CoV-2 by FDA under an Emergency Use Authorization (EUA). This EUA will remain in effect (meaning this test can be used) for the duration of the COVID-19 declaration under Section 564(b)(1) of the Act, 21 U.S.C. section 360bbb-3(b)(1), unless the authorization is terminated or revoked.     Resp Syncytial Virus by PCR NEGATIVE NEGATIVE Final    Comment: (NOTE) Fact Sheet for Patients: BloggerCourse.com  Fact Sheet for Healthcare Providers: SeriousBroker.it  This test is not yet approved or cleared by the [place]  FDA and has been authorized for detection and/or diagnosis of SARS-CoV-2 by FDA under an Emergency Use Authorization (EUA). This EUA will remain in effect (meaning this test can be used) for the duration of the COVID-19 declaration under Section 564(b)(1) of the Act, 21 U.S.C. section 360bbb-3(b)(1), unless the authorization is terminated or revoked.  Performed at [facility], [address], [place], [place] [address]   Respiratory (~20  pathogens) panel by PCR     Status: None   Collection Time: 07/03/24  6:41 PM   Specimen: Nasopharyngeal Swab; Respiratory  Result Value Ref Range Status   Adenovirus NOT DETECTED NOT DETECTED Final   Coronavirus 229E NOT DETECTED NOT DETECTED Final    Comment: (NOTE) The Coronavirus on the Respiratory Panel, DOES NOT test for the novel  Coronavirus (2019 nCoV)    Coronavirus HKU1 NOT DETECTED NOT DETECTED Final   Coronavirus NL63 NOT DETECTED NOT DETECTED Final   Coronavirus OC43 NOT DETECTED NOT DETECTED Final   Metapneumovirus NOT DETECTED NOT DETECTED Final   Rhinovirus / Enterovirus NOT DETECTED NOT DETECTED Final   Influenza A NOT DETECTED NOT DETECTED Final   Influenza B NOT DETECTED NOT DETECTED Final   Parainfluenza Virus 1 NOT DETECTED NOT DETECTED Final   Parainfluenza Virus 2 NOT DETECTED NOT DETECTED Final   Parainfluenza Virus 3 NOT DETECTED NOT DETECTED Final   Parainfluenza Virus 4 NOT DETECTED NOT DETECTED Final   Respiratory Syncytial Virus NOT DETECTED NOT DETECTED Final   Bordetella pertussis NOT DETECTED  NOT DETECTED Final   Bordetella Parapertussis NOT DETECTED NOT DETECTED Final   Chlamydophila pneumoniae NOT DETECTED NOT DETECTED Final   Mycoplasma pneumoniae NOT DETECTED NOT DETECTED Final    Comment: Performed at [facility], [address] [place], [place] [address]    Labs: CBC: Recent Labs  Lab 07/02/24 1108 07/03/24 0601 07/04/24 0500  WBC 12.4* 13.3* 9.7  NEUTROABS  --   --  8.6*  HGB 7.8* 7.9* 7.8*  HCT 26.5* 26.3* 27.2*  MCV 80.3 79.7* 82.4  PLT 490* 426* 479*   Basic Metabolic Panel: Recent Labs  Lab 07/02/24 1108 07/03/24 0601 07/04/24 0500  NA 133* 133* 134*  K 3.9 3.7 4.4  CL 95* 96* 97*  CO2 24 24 28   GLUCOSE 131* 111* 122*  BUN 23 19 26*  CREATININE 1.16* 0.92 0.85  CALCIUM 9.2 9.1 9.8   Liver Function Tests: No results for input(s): AST, ALT, ALKPHOS, BILITOT, PROT, ALBUMIN in the last 168  hours. CBG: No results for input(s): GLUCAP in the last 168 hours.  Discharge time spent: greater than 30 minutes.  Signed: Delon Herald, MD [facility] 07/04/2024

## 2024-07-04 NOTE — Progress Notes (Signed)
 NAME:  Breanna Ramirez, MRN:  [id number], DOB:  June 06, 1956, LOS: 0 ADMISSION DATE:  07/02/2024, CONSULTATION DATE: 05/03/2024 REFERRING MD: Delon Herald, MD, CHIEF COMPLAINT: COPD, bronchiectasis exacerbation  History of Present Illness:  Patient with emphysema, chronic respiratory failure on home oxygen , presents with recurrent shortness of breath, coughing, and wheezing.  She follows with Dr. Kara  Exacerbation history and hospitalizations - She has had multiple hospitalizations this year: March, May, for similar presentation with acute hypoxic respiratory failure with pulmonary infiltrates.  Treated with antibiotics, steroids.  Underwent a bronchoscopy with BAL in the right middle lobe and lingula during May hospitalization with findings of thick tenacious mucus and lymphocytosis on BAL, negative cultures and cytology.  She had a high-res CT on 6/27 and was placed on a prolonged taper of prednisone  starting at 40 mg and Bactrim  for prophylaxis with plans for discussion of bronchoscopy with biopsy.  She started the medication yesterday however symptoms worsened necessitating hospital admission.  Chronic obstructive pulmonary disease and emphysema - Diagnosed with COPD and emphysema - Though lung function tests reveal restriction and diffusion impairment - On home oxygen  therapy for 3-4 years, typically at 3 L/min, recently increased to 4 L/min due to worsening symptoms - Oxygen  saturation at home in the low eighties, prompting increase in oxygen  flow - Current medications: Trelegy, Ohtuvayre  nebulizer, recently started Daliresp , Singulair   Systemic and extrapulmonary symptoms - No joint pains, rash, stiffness, difficulty swallowing, dry mouth, or dry eyes  Tobacco use - Significant smoking history: 1 to 1.5 packs per day for 40 years - Quit smoking in 2015  Occupational and environmental exposures - Works at [employer]; on medical leave for over two months -  Potential exposure to fabric dust and lint in the building, though not directly in work area - Lives in an apartment with no pets, mold issues, or significant environmental exposures at home - Recent exposure to mulch near apartment, but no direct contact  Pertinent  Medical History    has a past medical history of COPD (chronic obstructive pulmonary disease) (HCC), Essential hypertension, GERD (gastroesophageal reflux disease), Pneumonia (05/2017), and Respiratory failure with hypoxia (HCC) (05/2017).   Significant Hospital Events: Including procedures, antibiotic start and stop dates in addition to other pertinent events     Interim History / Subjective:  Feeling better since yesterday - dramatic improvement in her shortness of breath. Wants to go home.   Objective    Blood pressure 112/67, pulse (!) 55, temperature 97.6 F (36.4 C), resp. rate 18, weight 55.8 kg, SpO2 96%.    FiO2 (%):  [36 %] 36 %   Intake/Output Summary (Last 24 hours) at 07/04/2024 0936 Last data filed at 07/03/2024 1300 Gross per 24 hour  Intake 240 ml  Output --  Net 240 ml   Filed Weights   07/02/24 1025  Weight: 55.8 kg    Examination: Gen: well appearing elderly woman, on nebulizer treatment, no respiratory distress ENT: mmm on nasal cannula CV: RRR Resp: no crackles, no wheeze, breathing non labored Ext: no edema   Lab/imaging reviewed. Significant for  CT high resolution on 06/29/2024 with emphysema multifocal areas of consolidation, bronchiectasis groundglass mosaic attenuation in alternate pattern  Chest x-ray 07/02/2024 with bilateral lung opacities, interstitial markings, blunting of right costophrenic angle  PFTs on 04/2012 with moderate obstruction, diffusion defect CTD serologies, hypersensitivity panel 05/04/2024 with borderline positive ANA 1: 40, nuclear speckled  Procalcitonin low  Resolved problem list   Assessment and  Plan  Chronic obstructive pulmonary disease (COPD) with  emphysema Lung function tests do not show typical obstruction. On multiple medications including Trelegy, Ohtuvayre  nebulizer, and Singulair . On oxygen  therapy for 3-4 years, recently increased to 4 liters due to worsening symptoms. Smoking cessation in 2015. - Change bronchodilator therapy to Brovana , Pulmicort , Yupelri .  Continue Singulair  - Continue oxygen  therapy at 4 liters as needed  Interstitial lung disease with bronchiectasis Recent CT scan and bronchoscopy with lavage showed air trapping, waxing and waning increased lymphocytes, suggesting chronic hypersensitivity pneumonitis though she does not give significant exposures except for working in [employer] and hypersensitivity panel in the past was negative.  Negative CTD serologies and no symptoms of autoimmune disease. - Repeat autoimmune labs and myositis panel pending - improvement with 125 solumedrol and nebulizer therapy. Plan as outpatient had been to start prednisone  40 mg with bactrim  MWF. Dr. Kara has already sent these to her pharmacy. This is what she should continue at discharge.  - discussed bronchoscopy while inpatient and she would prefer to hold off at this time - suspect tbbx would be lower yield and surgical lung biopsy would be more informative   No objection to discharge from pulmonary perspective today. She has follow up on 7/9 in our office already.   Verdon Gore, MD Pulmonary and Critical Care Medicine [facility] 07/04/2024 9:41 AM Pager: see AMION  If no response to pager, please call critical care on call (see AMION) until 7pm After 7:00 pm call Elink

## 2024-07-04 NOTE — Progress Notes (Signed)
 Discharge instructions were reviewed. She denied questions, or concerns at this time. Pt instructed to keep Brovana  in refrigerator and she requested an ice pack to keep medications cool. Yupelri  will be on hold until follow up with Dr. Kara. IV removed, site is clean dry and intact. Telemetry box # [id number] returned to the desk

## 2024-07-04 NOTE — Telephone Encounter (Signed)
 Pharmacy Patient Advocate Encounter  Received notification from [facility] that Prior Authorization for Yupelri  175MCG/3ML solution  has been APPROVED from 07/04/2024 to 07/04/2025   PA #/Case ID/Reference #: [id number]

## 2024-07-04 NOTE — Progress Notes (Addendum)
   07/04/24 1103  TOC Brief Assessment  Insurance and Status Reviewed  Patient has primary care physician Yes  Home environment has been reviewed home  Prior level of function: independent  Prior/Current Home Services No current home services  Social Drivers of Health Review SDOH reviewed no interventions necessary  Readmission risk has been reviewed Yes  Transition of care needs no transition of care needs at this time   Nebulizer order via [employer]

## 2024-07-04 NOTE — Progress Notes (Signed)
 Mobility Specialist - Progress Note   07/04/24 1044  Oxygen  Therapy  SpO2 92 %  O2 Device Nasal Cannula  O2 Flow Rate (L/min) 4 L/min  Mobility  Activity Ambulated independently in hallway  Level of Assistance Independent  Assistive Device None  Distance Ambulated (ft) 500 ft  Activity Response Tolerated well  Mobility Referral Yes  Mobility visit 1 Mobility  Mobility Specialist Start Time (ACUTE ONLY) 1034  Mobility Specialist Stop Time (ACUTE ONLY) 1045  Mobility Specialist Time Calculation (min) (ACUTE ONLY) 11 min   Pt received in bed and agreeable to mobility. No complaints during session. Pt to bed after session with all needs met.    Pre-mobility: 95% SpO2 (4L [place]) During mobility: 106 HR, 92% SpO2 (4L [place]) Post-mobility: HR, 96% SPO2 (4L [place])  Chief Technology Officer

## 2024-07-05 ENCOUNTER — Telehealth: Payer: Self-pay | Admitting: Pulmonary Disease

## 2024-07-05 ENCOUNTER — Other Ambulatory Visit (HOSPITAL_COMMUNITY): Payer: Self-pay

## 2024-07-05 DIAGNOSIS — Z0289 Encounter for other administrative examinations: Secondary | ICD-10-CM

## 2024-07-05 LAB — SCLERODERMA DIAGNOSTIC PROFILE
Anti Nuclear Antibody (ANA): NEGATIVE
Scleroderma (Scl-70) (ENA) Antibody, IgG: <0.2 AI (ref 0.0–0.9)

## 2024-07-05 LAB — ANCA PROFILE
Anti-MPO Antibodies: <0.2 U (ref 0.0–0.9)
Anti-PR3 Antibodies: <0.2 U (ref 0.0–0.9)
Atypical P-ANCA titer: <1:20 {titer}
C-ANCA: <1:20 {titer}
P-ANCA: <1:20 {titer}

## 2024-07-05 LAB — RHEUMATOID FACTOR: Rheumatoid fact SerPl-aCnc: 10.1 [IU]/mL (ref <14.0)

## 2024-07-05 LAB — SJOGRENS SYNDROME-B EXTRACTABLE NUCLEAR ANTIBODY: SSB (La) (ENA) Antibody, IgG: <0.2 AI (ref 0.0–0.9)

## 2024-07-05 LAB — SJOGRENS SYNDROME-A EXTRACTABLE NUCLEAR ANTIBODY: SSA (Ro) (ENA) Antibody, IgG: 0.2 AI (ref 0.0–0.9)

## 2024-07-05 NOTE — Telephone Encounter (Signed)
 Short term disability has been successfully faxed to [employer]. Voicemail left for patient.

## 2024-07-05 NOTE — Telephone Encounter (Signed)
Forms have been filled out and signed.

## 2024-07-06 ENCOUNTER — Ambulatory Visit (INDEPENDENT_AMBULATORY_CARE_PROVIDER_SITE_OTHER): Admitting: Pulmonary Disease

## 2024-07-06 ENCOUNTER — Other Ambulatory Visit: Payer: Self-pay

## 2024-07-06 ENCOUNTER — Encounter

## 2024-07-06 ENCOUNTER — Encounter: Payer: Self-pay | Admitting: Pulmonary Disease

## 2024-07-06 VITALS — BP 109/57 | HR 89 | Ht 64.0 in | Wt 126.8 lb

## 2024-07-06 DIAGNOSIS — J8489 Other specified interstitial pulmonary diseases: Secondary | ICD-10-CM

## 2024-07-06 DIAGNOSIS — J449 Chronic obstructive pulmonary disease, unspecified: Secondary | ICD-10-CM | POA: Diagnosis not present

## 2024-07-06 LAB — CYCLIC CITRUL PEPTIDE ANTIBODY, IGG/IGA: CCP Antibodies IgG/IgA: 6 U (ref 0–19)

## 2024-07-06 LAB — ANTINUCLEAR ANTIBODIES, IFA: ANA Ab, IFA: NEGATIVE

## 2024-07-06 MED ORDER — REVEFENACIN 175 MCG/3ML IN SOLN: 175.0000 ug | Freq: Every day | RESPIRATORY_TRACT | 6 refills | Status: AC

## 2024-07-06 MED ORDER — PREDNISONE 10 MG PO TABS: 40.0000 mg | ORAL_TABLET | Freq: Every day | ORAL | 0 refills | Status: DC

## 2024-07-06 MED ORDER — SULFAMETHOXAZOLE-TRIMETHOPRIM 800-160 MG PO TABS: 1.0000 | ORAL_TABLET | ORAL | 0 refills | Status: DC

## 2024-07-06 NOTE — Progress Notes (Signed)
 Synopsis: Referred in February 2023 for wheezing by Andriette Null, NP  Subjective:   PATIENT ID: Breanna Ramirez, MRN: [id number]  HPI  Chief Complaint  Patient presents with   Follow-up   Breanna Ramirez is a 68 year old woman, former smoker with COPD and GERD who returns to pulmonary clinic for COPD.   Patient was admitted 7/5 for progressive worsening of her breathing. She was started on high dose steroid taper on 7/3 for concern of organizing pneumonia. Patient was seen in hospital by pulmonary team and she did not want bronchoscopy or surgical lung biopsies performed.  She is feeling better since discharge. She is currently on 40mg  prednisone  daily and taking bactrim  1 tab 3 days per week. She thinks she is starting to feel better. She is using 4L of oxygen  now.   Disability paper work completed. Unable to determine if she will be able to return to work. Plan to treat for 2-3 months with steroids and reassess, but I believe she may likely not be able to return to work after this.   OV 06/13/24 She was admitted 5/18 to 5/21 for on going respiratory symptoms. She had bronchoscopy done 5/19 with significant mucous noted. She had trouble expectorating mucous. Anti-muscarinic medications were stopped to help thin secretions.  BAL cultures remain negative to date. Cytology is negative.  She experiences persistent thick mucus production despite recent hospitalization and treatment changes. She continues to have difficulty clearing secretions.   She uses a nebulizer and inhaler, recently switching from [facility] to Trelegy, which she finds more effective. Currently, she uses Ohtuvayre  nebulizers with slight improvement. Chest physical therapy, including a chest vest and high salt solution nebulizer treatment, did not yield significant results. She has hypertonic saline at home but has not been using it.  She started using oxygen  after recent hospitalizations and is  concerned about her oxygen  dependency and physical limitations.  She has difficulty lifting more than 10lbs. 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   OV 05/04/24 She has persistent wheezing and a non-productive cough since her hospitalization on March 14, 2024. Initially, she experienced pain in the lower right chest, now located in the upper right chest with associated wheezing. Despite treatment with amoxicillin , azithromycin , prednisone , and Augmentin , symptoms have only slightly improved. Ohtuvayre  was prescribed but not yet approved by insurance.  She experiences night sweats, which began before her hospitalization and continue. She denies fever, chills, or difficulty swallowing. No joint pains or muscle aches.  Her current medications include Trelegy, one puff daily, and nebulizer treatments twice a day. She uses a Flutter device regularly and has one remaining prednisone  pill.  She quit smoking in 2019 and occasionally smells smoke from neighbors in her apartment. No current smoking, vaping, or exposure to secondhand smoke within her home. She has no pets and does not use down pillows or comforters.   OV 08/25/22 ONO since last visit showed she qualified for night time oxygen . She is considering formal sleep study when she has more time away from work.  She was treated with levaquin  for 5 days and then augmentin  for 10 days in July by her nurse practitioner at work for on going cough.   She reports her breathing is well controlled at this time but continues to have some wheezing.   She has run out of  montelukast . She has significant post nasal drainage. GERD is well controlled at this time.   OV 06/02/22 She was admitted 5/21 to 5/24 for COPD exacerbation. She was discharged with supplemental oxygen  3L. Using at night mainly.   She continues to have cough with some sputum  production. She has wheezing but it is much improved. She is not quite back to baseline yet. She is concerned about returning to work where it is very hot and humid in [employer]. She is concerned it will aggravate her breathing again.   OV 05/12/22 She was treated for COPD exacerbation at last visit with prednisone  taper with Zpak. She was started on singulair . She continues on symbicort  2 puffs twice dialy and as needed duoneb treatments. She reports relief from the prednisone  taper but of recent she has increased cough, sputum production and wheezing.   PFTs show moderate restriction and diffusion defect. Flow volume loop does appear obstructed.   OV 02/12/22 She reports being treated in September 2022 for pneumonia and later developed COVID-19 infection in late October 2022 in which she was treated with Paxlovid.  She again was sick with pneumonia in December 2022 and responded well to antibiotics, steroids and using Symbicort  inhaler.  She tapered herself off the Symbicort  inhaler in January after she was feeling better.  She started to have wheezing towards the end of January along with shortness of breath.  She denies any cough or sputum production.  She reports that she notices the wheezing mostly at nighttime when going to bed.  She quit smoking in 2019.  She has a 40+ pack year smoking history.  She has significant secondhand smoke exposure in childhood.  She currently works as a Solicitor at a [employer]  She denies any significant dust or chemical exposures.  She does have significant reflux and seasonal allergies.  She reports springtime is the most difficult season for her due to the pollen.  She denies any increased cough or dysphagia when eating or drinking.  She denies the sensation of food getting stuck in her throat or chest.  Her father had asthma and her maternal grandfather had asthma.  She works as a Insurance account manager at [employer]. It is very hot and humid in the  factory.   Past Medical History:  Diagnosis Date   COPD (chronic obstructive pulmonary disease) (HCC)    Essential hypertension    GERD (gastroesophageal reflux disease)    Pneumonia 05/2017   Respiratory failure with hypoxia (HCC) 05/2017     Family History  Problem Relation Age of Onset   Cancer Father      Social History   Socioeconomic History   Marital status: Divorced    Spouse name: Not on file   Number of children: 1   Years of education: Not on file   Highest education level: Not on file  Occupational History   Occupation: Works in Designer, fashion/clothing.  Tobacco Use   Smoking status: Former    Current packs/day: 0.00    Average packs/day: 1 pack/day for 45.0 years (45.0 ttl pk-yrs)    Types: Cigarettes    Start date: 12/29/1972    Quit date: 12/29/2017    Years since quitting: 6.5   Smokeless tobacco: Never  Vaping Use   Vaping status: Never Used  Substance and Sexual Activity   Alcohol use: No   Drug use: No   Sexual activity: Never    Birth control/protection: Post-menopausal  Other Topics Concern   Not on  file  Social History Narrative   Not on file   Social Drivers of Health   Financial Resource Strain: Not on file  Food Insecurity: No Food Insecurity (07/03/2024)   Hunger Vital Sign    Worried About Running Out of Food in the Last Year: Never true    Ran Out of Food in the Last Year: Never true  Transportation Needs: No Transportation Needs (07/03/2024)   PRAPARE - Administrator, Civil Service (Medical): No    Lack of Transportation (Non-Medical): No  Physical Activity: Not on file  Stress: Not on file  Social Connections: Unknown (07/03/2024)   Social Connection and Isolation Panel    Frequency of Communication with Friends and Family: Patient declined    Frequency of Social Gatherings with Friends and Family: Not on file    Attends Religious Services: Patient declined    Active Member of Clubs or Organizations: Not on file    Attends Tax inspector Meetings: Patient declined    Marital Status: Not on file  Intimate Partner Violence: Not At Risk (07/03/2024)   Humiliation, Afraid, Rape, and Kick questionnaire    Fear of Current or Ex-Partner: No    Emotionally Abused: No    Physically Abused: No    Sexually Abused: No     Allergies  Allergen Reactions   Codeine Nausea And Vomiting   Daliresp  [Roflumilast ] Other (See Comments)    Severe nose bleeds     Outpatient Medications Prior to Visit  Medication Sig Dispense Refill   albuterol  (PROVENTIL ) (2.5 MG/3ML) 0.083% nebulizer solution Take 3 mLs (2.5 mg total) by nebulization every 2 (two) hours as needed for wheezing. 75 mL 0   albuterol  (VENTOLIN  HFA) 108 (90 Base) MCG/ACT inhaler Inhale 2 puffs into the lungs every 6 (six) hours as needed for wheezing or shortness of breath. (Patient taking differently: Inhale 2 puffs into the lungs every 4 (four) hours as needed for wheezing or shortness of breath.) 6.7 g 0   arformoterol  (BROVANA ) 15 MCG/2ML NEBU Take 2 mLs (15 mcg total) by nebulization 2 (two) times daily. 120 mL 0   Aspirin -Salicylamide-Caffeine (BC HEADACHE POWDER PO) Take 1 packet by mouth daily as needed (for headaches or back pain).     budesonide  (PULMICORT ) 0.5 MG/2ML nebulizer solution Take 2 mLs (0.5 mg total) by nebulization 2 (two) times daily. 120 mL 0   cholecalciferol (VITAMIN D3) 25 MCG (1000 UNIT) tablet Take 1,000 Units by mouth every other day.     Ensifentrine  (OHTUVAYRE ) 3 MG/2.5ML SUSP Inhale 1 Inhalation into the lungs 2 (two) times daily. (Patient taking differently: Inhale 3 mg into the lungs 2 (two) times daily.) 150 mL 11   esomeprazole  (NEXIUM ) 40 MG capsule Take 1 capsule (40 mg total) by mouth daily. (Patient taking differently: Take 40 mg by mouth at bedtime.) 90 capsule 4   fluticasone  (FLONASE ) 50 MCG/ACT nasal spray Place 1 spray into both nostrils every evening. (Patient taking differently: Place 1 spray into both nostrils 2 (two) times  daily as needed for allergies or rhinitis.) 16 each 11   furosemide  (LASIX ) 20 MG tablet Take 1 tablet (20 mg total) by mouth daily. (Patient taking differently: Take 20 mg by mouth daily as needed for fluid or edema.) 30 tablet 11   ibuprofen (ADVIL) 200 MG tablet Take 400 mg by mouth every 6 (six) hours as needed for mild pain (pain score 1-3) or headache.     metoprolol  tartrate (LOPRESSOR ) 25  MG tablet Take 0.5 tablets (12.5 mg total) by mouth 2 (two) times daily. 30 tablet 0   montelukast  (SINGULAIR ) 10 MG tablet Take 1 tablet (10 mg total) by mouth at bedtime. 30 tablet 11   OXYGEN  Inhale 3 L/min into the lungs continuous.     predniSONE  (DELTASONE ) 10 MG tablet Take 4 tablets (40 mg total) by mouth daily with breakfast for 14 days, THEN 3 tablets (30 mg total) daily with breakfast for 14 days. 98 tablet 0   revefenacin  (YUPELRI ) 175 MCG/3ML nebulizer solution Take 3 mLs (175 mcg total) by nebulization daily. 90 mL 0   simvastatin  (ZOCOR ) 40 MG tablet Take 1 tablet (40 mg total) by mouth at bedtime. 90 tablet 3   sodium chloride  HYPERTONIC 3 % nebulizer solution Take by nebulization as needed for other. (Patient taking differently: Take 4 mLs by nebulization as needed (as directed for COPD- related symptoms).) 750 mL 12   sulfamethoxazole -trimethoprim  (BACTRIM  DS) 800-160 MG tablet Take 1 tablet by mouth 3 (three) times a week. 12 tablet 0   No facility-administered medications prior to visit.   Review of Systems  Constitutional:  Negative for chills, fever, malaise/fatigue and weight loss.  HENT:  Positive for congestion. Negative for sinus pain and sore throat.   Eyes: Negative.   Respiratory:  Positive for cough, sputum production, shortness of breath and wheezing. Negative for hemoptysis.   Cardiovascular:  Negative for chest pain, palpitations, orthopnea, claudication and leg swelling.  Gastrointestinal:  Negative for abdominal pain, heartburn, nausea and vomiting.  Genitourinary:  Negative.   Musculoskeletal:  Negative for joint pain and myalgias.  Skin:  Negative for rash.  Neurological:  Negative for weakness and headaches.  Endo/Heme/Allergies: Negative.   Psychiatric/Behavioral: Negative.      Objective:   Vitals:   07/06/24 1359 07/06/24 1400  BP: (!) 109/57 (!) 109/57  Pulse: 84 89  SpO2: 95% 95%  Weight: 126 lb 12.8 oz (57.5 kg)   Height: 5' 4 (1.626 m)      Physical Exam Constitutional:      General: She is not in acute distress.    Appearance: She is not ill-appearing.  HENT:     Head: Normocephalic and atraumatic.  Eyes:     General: No scleral icterus.    Conjunctiva/sclera: Conjunctivae normal.  Cardiovascular:     Rate and Rhythm: Normal rate and regular rhythm.     Pulses: Normal pulses.     Heart sounds: Normal heart sounds. No murmur heard. Pulmonary:     Effort: Pulmonary effort is normal.     Breath sounds: No wheezing, rhonchi or rales.     Comments: Inspiratory chirps, scattered Musculoskeletal:     Right lower leg: No edema.     Left lower leg: No edema.  Skin:    General: Skin is warm and dry.  Neurological:     Mental Status: She is alert.    CBC    Component Value Date/Time   WBC 9.7 07/04/2024 0500   RBC 3.30 (L) 07/04/2024 0500   HGB 7.8 (L) 07/04/2024 0500   HCT 27.2 (L) 07/04/2024 0500   PLT 479 (H) 07/04/2024 0500   MCV 82.4 07/04/2024 0500   MCH 23.6 (L) 07/04/2024 0500   MCHC 28.7 (L) 07/04/2024 0500   RDW 17.6 (H) 07/04/2024 0500   LYMPHSABS 0.4 (L) 07/04/2024 0500   MONOABS 0.6 07/04/2024 0500   EOSABS 0.0 07/04/2024 0500   BASOSABS 0.0 07/04/2024 0500  Latest Ref Rng & Units 07/04/2024    5:00 AM 07/03/2024    6:01 AM 07/02/2024   11:08 AM  BMP  Glucose 70 - 99 mg/dL 877  888  868   BUN 8 - 23 mg/dL 26  19  23    Creatinine 0.44 - 1.00 mg/dL 9.14  9.07  8.83   Sodium 135 - 145 mmol/L 134  133  133   Potassium 3.5 - 5.1 mmol/L 4.4  3.7  3.9   Chloride 98 - 111 mmol/L 97  96  95   CO2 22 -  32 mmol/L 28  24  24    Calcium 8.9 - 10.3 mg/dL 9.8  9.1  9.2    Chest imaging: CXR 05/18/22 Cardiomediastinal silhouette is stable. No pneumothorax. Blunting of the costophrenic angles unchanged since September 25, 2020. Emphysematous changes identified in the lungs. Interstitial prominence is similar in the interval. Chronic opacity in the lingula and right middle lobe was noted to represent atelectasis on the October 31, 2021 CT scan. The opacity in the right base is similar in appearance compared to September 25, 2020.  CTA Chest PE 10/31/21 1.  No evidence of pulmonary embolism. 2. Low lung volumes with chronic bibasilar volume loss and areas of atelectasis/scarring. Mosaic attenuation is indicative of air trapping. 3. Debris in the esophagus, suggesting dysmotility or gastroesophageal reflux. 4. Age advanced coronary artery atherosclerosis. Recommend assessment of coronary risk factors and consideration of medical therapy. 5. Aortic atherosclerosis  PFT:    Latest Ref Rng & Units 05/12/2022   10:47 AM  PFT Results  FVC-Pre L 1.67   FVC-Predicted Pre % 51   FVC-Post L 1.72   FVC-Predicted Post % 52   Pre FEV1/FVC % % 77   Post FEV1/FCV % % 79   FEV1-Pre L 1.28   FEV1-Predicted Pre % 51   FEV1-Post L 1.36   DLCO uncorrected ml/min/mmHg 8.38   DLCO UNC% % 41   DLCO corrected ml/min/mmHg 8.38   DLCO COR %Predicted % 41   DLVA Predicted % 56   TLC L 4.06   TLC % Predicted % 78   RV % Predicted % 104     Labs:  Path:  Echo:  Heart Catheterization:   MBS 05/17/24 Clinical Impression: Clinical Impression: Patient presents with a mild oropharyngeal dysphagia with an esophageal component as well. Anterior hyoid excursion was partial in completion and although epiglottic inversion and laryngeal vestibule closure appeared complete, they were delayed, resulting in instances of penetration above the vocal cords which cleared laryngeal vestibule (PAS 2) with thin  liquids. PES opening was partial in duration and distention, resulting in diffuse pharyngeal residuals with solids and liquids. Dry swallow helped to clear pharyngeal residuals, though patient did not exhibit sensation to any pharyngeal residuals. 13mm barium tablet transited through pharynx, PES and esophagus without difficulty. During esophageal sweep, barium stasis, questionable narrowing at portion of distal esophagus with retrograde movement that stayed within the esophagus  Assessment & Plan:   Chronic obstructive pulmonary disease, unspecified COPD type (HCC) - Plan: Pulmonary Function Test  Discussion: Breanna Ramirez is a 68 year old woman, former smoker with COPD and GERD who returns to pulmonary clinic for COPD.   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 - continue budesonide  twice daily - continue brovana  twice daily - continue yupelri  daily - continue ohtuvayre   Chronic Hypoxemic Respiratory Failure - continue 4L   Disability - paperwork completed today  Follow up in 4-6 weeks to monitor on steroid taper   Dorn Chill, MD [facility] Office: [phone]   Current Outpatient Medications:    albuterol  (PROVENTIL ) (2.5 MG/3ML) 0.083% nebulizer solution, Take 3 mLs (2.5 mg total) by nebulization every 2 (two) hours as needed for wheezing., Disp: 75 mL, Rfl: 0   albuterol  (VENTOLIN  HFA) 108 (90 Base) MCG/ACT inhaler, Inhale 2 puffs into the lungs every 6 (six) hours as needed for wheezing or shortness of breath. (Patient taking differently: Inhale 2 puffs into the lungs every 4 (four) hours as needed for wheezing or shortness of breath.), Disp: 6.7 g, Rfl: 0   arformoterol  (BROVANA ) 15 MCG/2ML NEBU, Take 2 mLs  (15 mcg total) by nebulization 2 (two) times daily., Disp: 120 mL, Rfl: 0   Aspirin -Salicylamide-Caffeine (BC HEADACHE POWDER PO), Take 1 packet by mouth daily as needed (for headaches or back pain)., Disp: , Rfl:    budesonide  (PULMICORT ) 0.5 MG/2ML nebulizer solution, Take 2 mLs (0.5 mg total) by nebulization 2 (two) times daily., Disp: 120 mL, Rfl: 0   cholecalciferol (VITAMIN D3) 25 MCG (1000 UNIT) tablet, Take 1,000 Units by mouth every other day., Disp: , Rfl:    Ensifentrine  (OHTUVAYRE ) 3 MG/2.5ML SUSP, Inhale 1 Inhalation into the lungs 2 (two) times daily. (Patient taking differently: Inhale 3 mg into the lungs 2 (two) times daily.), Disp: 150 mL, Rfl: 11   esomeprazole  (NEXIUM ) 40 MG capsule, Take 1 capsule (40 mg total) by mouth daily. (Patient taking differently: Take 40 mg by mouth at bedtime.), Disp: 90 capsule, Rfl: 4   fluticasone  (FLONASE ) 50 MCG/ACT nasal spray, Place 1 spray into both nostrils every evening. (Patient taking differently: Place 1 spray into both nostrils 2 (two) times daily as needed for allergies or rhinitis.), Disp: 16 each, Rfl: 11   furosemide  (LASIX ) 20 MG tablet, Take 1 tablet (20 mg total) by mouth daily. (Patient taking differently: Take 20 mg by mouth daily as needed for fluid or edema.), Disp: 30 tablet, Rfl: 11   ibuprofen (ADVIL) 200 MG tablet, Take 400 mg by mouth every 6 (six) hours as needed for mild pain (pain score 1-3) or headache., Disp: , Rfl:    metoprolol  tartrate (LOPRESSOR ) 25 MG tablet, Take 0.5 tablets (12.5 mg total) by mouth 2 (two) times daily., Disp: 30 tablet, Rfl: 0   montelukast  (SINGULAIR ) 10 MG tablet, Take 1 tablet (10 mg total) by mouth at bedtime., Disp: 30 tablet, Rfl: 11   OXYGEN , Inhale 3 L/min into the lungs continuous., Disp: , Rfl:    predniSONE  (DELTASONE ) 10 MG tablet, Take 4 tablets (40 mg total) by mouth daily with breakfast for 14 days, THEN 3 tablets (30 mg total) daily with breakfast for 14 days., Disp: 98 tablet, Rfl:  0   revefenacin  (YUPELRI ) 175 MCG/3ML nebulizer solution, Take 3 mLs (175 mcg total) by nebulization daily., Disp: 90 mL, Rfl: 0   simvastatin  (ZOCOR ) 40 MG tablet, Take 1 tablet (40 mg total) by mouth at bedtime., Disp: 90 tablet, Rfl: 3   sodium chloride  HYPERTONIC 3 % nebulizer solution, Take by nebulization as needed for other. (Patient taking differently: Take 4 mLs by nebulization as needed (as directed for COPD- related symptoms).), Disp: 750 mL, Rfl:  12   sulfamethoxazole -trimethoprim  (BACTRIM  DS) 800-160 MG tablet, Take 1 tablet by mouth 3 (three) times a week., Disp: 12 tablet, Rfl: 0

## 2024-07-06 NOTE — Patient Instructions (Addendum)
 Take 40mg  prednisone  daily for total of 28 days before tapering down as discussed before  Then taper to 30mg  daily for 14 days Then taper to 20mg  daily for 14 days Then taper to 10mg  daily for 14 days  Continue bactrim  DS 1 tab M/W/F until taking less than 20mg  of prednisone  daily  Continue budesonide  nebs twice daily  Continue brovana  nebs twice daily  Continue ohtuvayre  nebs twice daily  Continue yupelri  neb daily  Follow up in 4-6 weeks

## 2024-07-07 ENCOUNTER — Other Ambulatory Visit (HOSPITAL_COMMUNITY): Payer: Self-pay

## 2024-07-07 NOTE — Telephone Encounter (Signed)
 All charges have been completed and submitted to patient's guarantor acct. Ready for payment collection.   Yearly $29 FMLA paperwork charge.   Nothing further needed.

## 2024-07-08 LAB — HYPERSENSITIVITY PNEUMONITIS
A. Pullulans Abs: NEGATIVE
A.Fumigatus #1 Abs: NEGATIVE
Micropolyspora faeni, IgG: NEGATIVE
Pigeon Serum Abs: NEGATIVE
Thermoact. Saccharii: NEGATIVE
Thermoactinomyces vulgaris, IgG: NEGATIVE

## 2024-07-14 ENCOUNTER — Telehealth: Payer: Self-pay

## 2024-07-14 NOTE — Telephone Encounter (Signed)
 Copied from CRM [id number]. Topic: General - Other >> Jul 14, 2024  8:09 AM Isabell A wrote: Reason for CRM: Patient states she will need a return to work note soon - going back August 5th but her next appointment isn't until September, patient would like to know what she should do.  Callback number: [phone]

## 2024-07-14 NOTE — Telephone Encounter (Signed)
 Mailed patient  return to work letter

## 2024-07-19 ENCOUNTER — Encounter: Payer: Self-pay | Admitting: Primary Care

## 2024-07-19 ENCOUNTER — Other Ambulatory Visit (INDEPENDENT_AMBULATORY_CARE_PROVIDER_SITE_OTHER)

## 2024-07-19 ENCOUNTER — Ambulatory Visit: Admitting: Primary Care

## 2024-07-19 ENCOUNTER — Ambulatory Visit: Payer: Self-pay | Admitting: Pulmonary Disease

## 2024-07-19 VITALS — BP 122/60 | HR 73 | Temp 97.6°F | Ht 64.5 in | Wt 136.2 lb

## 2024-07-19 DIAGNOSIS — Z9981 Dependence on supplemental oxygen: Secondary | ICD-10-CM

## 2024-07-19 DIAGNOSIS — J449 Chronic obstructive pulmonary disease, unspecified: Secondary | ICD-10-CM

## 2024-07-19 DIAGNOSIS — J8489 Other specified interstitial pulmonary diseases: Secondary | ICD-10-CM | POA: Diagnosis not present

## 2024-07-19 DIAGNOSIS — J9611 Chronic respiratory failure with hypoxia: Secondary | ICD-10-CM | POA: Diagnosis not present

## 2024-07-19 DIAGNOSIS — M7989 Other specified soft tissue disorders: Secondary | ICD-10-CM

## 2024-07-19 DIAGNOSIS — I5032 Chronic diastolic (congestive) heart failure: Secondary | ICD-10-CM

## 2024-07-19 LAB — BRAIN NATRIURETIC PEPTIDE: Pro B Natriuretic peptide (BNP): 143 pg/mL — ABNORMAL HIGH (ref 0.0–100.0)

## 2024-07-19 LAB — COMPREHENSIVE METABOLIC PANEL WITH GFR
ALT: 27 U/L (ref 0–35)
AST: 16 U/L (ref 0–37)
Albumin: 3.9 g/dL (ref 3.5–5.2)
Alkaline Phosphatase: 50 U/L (ref 39–117)
BUN: 22 mg/dL (ref 6–23)
CO2: 28 meq/L (ref 19–32)
Calcium: 8.8 mg/dL (ref 8.4–10.5)
Chloride: 101 meq/L (ref 96–112)
Creatinine, Ser: 0.93 mg/dL (ref 0.40–1.20)
GFR: 63.08 mL/min (ref >60.00)
Glucose, Bld: 107 mg/dL — ABNORMAL HIGH (ref 70–99)
Potassium: 4.1 meq/L (ref 3.5–5.1)
Sodium: 139 meq/L (ref 135–145)
Total Bilirubin: 0.2 mg/dL (ref 0.2–1.2)
Total Protein: 6.1 g/dL (ref 6.0–8.3)

## 2024-07-19 NOTE — Patient Instructions (Addendum)
  VISIT SUMMARY: Today, we discussed your bilateral leg swelling, which has been present for about a week. We reviewed your current medications and their potential side effects, and we also considered your history of congestive heart failure and organizing pneumonia. Your breathing has improved since starting prednisone , but the leg swelling remains a concern.  YOUR PLAN: -ORGANIZING PNEUMONIA: Organizing pneumonia is a condition where the small airways and air sacs in the lungs become inflamed and filled with connective tissue. Your oxygen  levels have improved significantly since starting prednisone . We will continue your current prednisone  regimen and communicate with Dr. Kara regarding your management.  -CONGESTIVE HEART FAILURE: Congestive heart failure is a chronic condition where the heart doesn't pump blood as well as it should, leading to fluid buildup. This may be contributing to your leg swelling. We will order lab work to check your kidney and liver function and BNP levels. Based on the results, we may increase your Lasix  dosage. You should also restrict your fluid intake to 1.5-2 liters per day, use compression stockings, and elevate your legs.  -LEG SWELLING: Your leg swelling could be due to your congestive heart failure or a side effect of prednisone . We will order a D-dimer test to rule out blood clots. If the test is positive, we may need to do an ultrasound of your legs. In the meantime, continue using compression stockings and elevating your legs. We will also monitor and adjust your Lasix  dosage as needed.  INSTRUCTIONS: Please follow up with the lab work for kidney function, liver function, and BNP levels as soon as possible. Continue to monitor your leg swelling and breathing. If you notice any significant changes or worsening symptoms, contact our office immediately. We will communicate with Dr. Kara regarding your organizing pneumonia management. Make sure to restrict your  fluid intake to 1.5-2 liters per day, use compression stockings, and elevate your legs regularly.  Follow-up Dr. Kara on September 25th

## 2024-07-19 NOTE — Progress Notes (Signed)
 @Patient  ID: Breanna Ramirez, female    DOB: 1956/04/10, 68 y.o.   MRN: [id number]  Chief Complaint  Patient presents with   Edema    Left and Right leg swelling. 7/10 pain level. Pt states it feels tight. X1 week. Has not taken any new medications or came off of anything lately. Has not seen pcp for leg swelling.     Referring provider: Austin Mutton, MD  HPI: 68 year old woman, former smoker with COPD and GERD who returns to pulmonary clinic for COPD.   Patient was admitted 7/5 for progressive worsening of her breathing. She was started on high dose steroid taper on 7/3 for concern of organizing pneumonia. Patient was seen in hospital by pulmonary team and she did not want bronchoscopy or surgical lung biopsies performed.  She is feeling better since discharge. She is currently on 40mg  prednisone  daily and taking bactrim  1 tab 3 days per week. She thinks she is starting to feel better. She is using 4L of oxygen  now.   Disability paper work completed. Unable to determine if she will be able to return to work. Plan to treat for 2-3 months with steroids and reassess, but I believe she may likely not be able to return to work after this.   OV 06/13/24 She was admitted 5/18 to 5/21 for on going respiratory symptoms. She had bronchoscopy done 5/19 with significant mucous noted. She had trouble expectorating mucous. Anti-muscarinic medications were stopped to help thin secretions.  BAL cultures remain negative to date. Cytology is negative.  She experiences persistent thick mucus production despite recent hospitalization and treatment changes. She continues to have difficulty clearing secretions.   She uses a nebulizer and inhaler, recently switching from [facility] to Trelegy, which she finds more effective. Currently, she uses Ohtuvayre  nebulizers with slight improvement. Chest physical therapy, including a chest vest and high salt solution nebulizer treatment, did not yield significant results. She  has hypertonic saline at home but has not been using it.  She started using oxygen  after recent hospitalizations and is concerned about her oxygen  dependency and physical limitations.  She has difficulty lifting more than 10lbs. 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   OV 05/04/24 She has persistent wheezing and a non-productive cough since her hospitalization on March 14, 2024. Initially, she experienced pain in the lower right chest, now located in the upper right chest with associated wheezing. Despite treatment with amoxicillin , azithromycin , prednisone , and Augmentin , symptoms have only slightly improved. Ohtuvayre  was prescribed but not yet approved by insurance.  She experiences night sweats, which began before her hospitalization and continue. She denies fever, chills, or difficulty swallowing. No joint pains or muscle aches.  Her current medications include Trelegy, one puff daily, and nebulizer treatments twice a day. She uses a Flutter device regularly and has one remaining prednisone  pill.  She quit smoking in 2019 and occasionally smells smoke from neighbors in her apartment. No current smoking, vaping, or exposure to secondhand smoke within her home. She has no pets and does not use down pillows or comforters.   OV 08/25/22 ONO since last visit showed she qualified for night time oxygen . She is considering formal sleep study when she has more time away from work.  She was treated with levaquin  for 5 days and then augmentin  for 10 days in July by her nurse practitioner  at work for on going cough.   She reports her breathing is well controlled at this time but continues to have some wheezing.   She has run out of montelukast . She has significant post nasal drainage. GERD is well controlled at this time.   OV 06/02/22 She was admitted 5/21 to 5/24 for COPD exacerbation.  She was discharged with supplemental oxygen  3L. Using at night mainly.   She continues to have cough with some sputum production. She has wheezing but it is much improved. She is not quite back to baseline yet. She is concerned about returning to work where it is very hot and humid in [employer]. She is concerned it will aggravate her breathing again.   OV 05/12/22 She was treated for COPD exacerbation at last visit with prednisone  taper with Zpak. She was started on singulair . She continues on symbicort  2 puffs twice dialy and as needed duoneb treatments. She reports relief from the prednisone  taper but of recent she has increased cough, sputum production and wheezing.   PFTs show moderate restriction and diffusion defect. Flow volume loop does appear obstructed.   OV 02/12/22 She reports being treated in September 2022 for pneumonia and later developed COVID-19 infection in late October 2022 in which she was treated with Paxlovid.  She again was sick with pneumonia in December 2022 and responded well to antibiotics, steroids and using Symbicort  inhaler.  She tapered herself off the Symbicort  inhaler in January after she was feeling better.  She started to have wheezing towards the end of January along with shortness of breath.  She denies any cough or sputum production.  She reports that she notices the wheezing mostly at nighttime when going to bed.  She quit smoking in 2019.  She has a 40+ pack year smoking history.  She has significant secondhand smoke exposure in childhood.  She currently works as a Solicitor at a [employer]  She denies any significant dust or chemical exposures.  She does have significant reflux and seasonal allergies.  She reports springtime is the most difficult season for her due to the pollen.  She denies any increased cough or dysphagia when eating or drinking.  She denies the sensation of food getting stuck in her throat or chest.  Her father had asthma and her maternal  grandfather had asthma.  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  07/19/2024- interim hx  Discussed the use of AI scribe software for clinical note transcription with the patient, who gave verbal consent to proceed.  History of Present Illness   Breanna Ramirez is a 68 year old female with congestive heart failure and organizing pneumonia who presents with bilateral leg swelling.  She has been experiencing bilateral leg swelling for approximately one week, with the swelling starting from her feet and extending up to her calves. There is a feeling of tightness but no associated pain in the calves. No recent travel.  She has been on prednisone  since July 3rd for organizing pneumonia, and the swelling began one to two weeks after starting this medication. She is also taking Bactrim  double strength and Lasix  once daily, though the Lasix  is not alleviating the swelling.  Her past medical history includes congestive heart failure. No history of blood clots in her legs or lungs and she is not currently on a blood thinner.  In the review of symptoms, she reports no increased shortness of breath or  changes in her breathing, stating that her breathing has improved since starting prednisone . No increased oxygen  requirements.      Allergies  Allergen Reactions   Codeine Nausea And Vomiting   Daliresp  [Roflumilast ] Other (See Comments)    Severe nose bleeds    Immunization History  Administered Date(s) Administered   Influenza-Unspecified 10/20/2022   Pneumococcal Polysaccharide-23 10/22/2018    Past Medical History:  Diagnosis Date   COPD (chronic obstructive pulmonary disease) (HCC)    Essential hypertension    GERD (gastroesophageal reflux disease)    Pneumonia 05/2017   Respiratory failure with hypoxia (HCC) 05/2017    Tobacco History: Social History   Tobacco Use  Smoking Status Former   Current packs/day: 0.00   Average  packs/day: 1 pack/day for 45.0 years (45.0 ttl pk-yrs)   Types: Cigarettes   Start date: 12/29/1972   Quit date: 12/29/2017   Years since quitting: 6.5  Smokeless Tobacco Never   Counseling given: Not Answered   Outpatient Medications Prior to Visit  Medication Sig Dispense Refill   albuterol  (PROVENTIL ) (2.5 MG/3ML) 0.083% nebulizer solution Take 3 mLs (2.5 mg total) by nebulization every 2 (two) hours as needed for wheezing. 75 mL 0   albuterol  (VENTOLIN  HFA) 108 (90 Base) MCG/ACT inhaler Inhale 2 puffs into the lungs every 6 (six) hours as needed for wheezing or shortness of breath. (Patient taking differently: Inhale 2 puffs into the lungs every 4 (four) hours as needed for wheezing or shortness of breath.) 6.7 g 0   arformoterol  (BROVANA ) 15 MCG/2ML NEBU Take 2 mLs (15 mcg total) by nebulization 2 (two) times daily. 120 mL 0   Aspirin -Salicylamide-Caffeine (BC HEADACHE POWDER PO) Take 1 packet by mouth daily as needed (for headaches or back pain).     budesonide  (PULMICORT ) 0.5 MG/2ML nebulizer solution Take 2 mLs (0.5 mg total) by nebulization 2 (two) times daily. 120 mL 0   cholecalciferol (VITAMIN D3) 25 MCG (1000 UNIT) tablet Take 1,000 Units by mouth every other day.     Ensifentrine  (OHTUVAYRE ) 3 MG/2.5ML SUSP Inhale 1 Inhalation into the lungs 2 (two) times daily. (Patient taking differently: Inhale 3 mg into the lungs 2 (two) times daily.) 150 mL 11   esomeprazole  (NEXIUM ) 40 MG capsule Take 1 capsule (40 mg total) by mouth daily. (Patient taking differently: Take 40 mg by mouth at bedtime.) 90 capsule 4   fluticasone  (FLONASE ) 50 MCG/ACT nasal spray Place 1 spray into both nostrils every evening. (Patient taking differently: Place 1 spray into both nostrils 2 (two) times daily as needed for allergies or rhinitis.) 16 each 11   furosemide  (LASIX ) 20 MG tablet Take 1 tablet (20 mg total) by mouth daily. (Patient taking differently: Take 20 mg by mouth daily as needed for fluid or edema.)  30 tablet 11   ibuprofen (ADVIL) 200 MG tablet Take 400 mg by mouth every 6 (six) hours as needed for mild pain (pain score 1-3) or headache.     metoprolol  tartrate (LOPRESSOR ) 25 MG tablet Take 0.5 tablets (12.5 mg total) by mouth 2 (two) times daily. 30 tablet 0   montelukast  (SINGULAIR ) 10 MG tablet Take 1 tablet (10 mg total) by mouth at bedtime. 30 tablet 11   OXYGEN  Inhale 3 L/min into the lungs continuous.     predniSONE  (DELTASONE ) 10 MG tablet Take 4 tablets (40 mg total) by mouth daily with breakfast for 14 days, THEN 3 tablets (30 mg total) daily with breakfast for 14 days. 98 tablet  0   predniSONE  (DELTASONE ) 10 MG tablet Take 4 tablets (40 mg total) by mouth daily with breakfast. 56 tablet 0   revefenacin  (YUPELRI ) 175 MCG/3ML nebulizer solution Take 3 mLs (175 mcg total) by nebulization daily. 90 mL 6   simvastatin  (ZOCOR ) 40 MG tablet Take 1 tablet (40 mg total) by mouth at bedtime. 90 tablet 3   sodium chloride  HYPERTONIC 3 % nebulizer solution Take by nebulization as needed for other. (Patient taking differently: Take 4 mLs by nebulization as needed (as directed for COPD- related symptoms).) 750 mL 12   sulfamethoxazole -trimethoprim  (BACTRIM  DS) 800-160 MG tablet Take 1 tablet by mouth 3 (three) times a week. 12 tablet 0   No facility-administered medications prior to visit.   Review of Systems  Review of Systems  Constitutional: Negative.   HENT: Negative.    Respiratory:  Negative for cough, chest tightness, shortness of breath and wheezing.   Cardiovascular:  Positive for leg swelling.   Physical Exam  BP 122/60 (BP Location: Right Arm, Patient Position: Sitting, Cuff Size: Normal)   Pulse 73   Temp 97.6 F (36.4 C) (Temporal)   Ht 5' 4.5 (1.638 m)   Wt 136 lb 3.2 oz (61.8 kg)   SpO2 98%   BMI 23.02 kg/m  Physical Exam Constitutional:      Appearance: Normal appearance.  HENT:     Head: Normocephalic and atraumatic.  Cardiovascular:     Rate and Rhythm:  Normal rate and regular rhythm.     Comments: +2 BLE pitting edema, L>R Pulmonary:     Effort: Pulmonary effort is normal.     Breath sounds: Normal breath sounds.  Musculoskeletal:        General: Normal range of motion.  Skin:    General: Skin is warm and dry.  Neurological:     General: No focal deficit present.     Mental Status: She is alert and oriented to person, place, and time. Mental status is at baseline.  Psychiatric:        Mood and Affect: Mood normal.        Behavior: Behavior normal.        Thought Content: Thought content normal.        Judgment: Judgment normal.      Lab Results:  CBC    Component Value Date/Time   WBC 9.7 07/04/2024 0500   RBC 3.30 (L) 07/04/2024 0500   HGB 7.8 (L) 07/04/2024 0500   HCT 27.2 (L) 07/04/2024 0500   PLT 479 (H) 07/04/2024 0500   MCV 82.4 07/04/2024 0500   MCH 23.6 (L) 07/04/2024 0500   MCHC 28.7 (L) 07/04/2024 0500   RDW 17.6 (H) 07/04/2024 0500   LYMPHSABS 0.4 (L) 07/04/2024 0500   MONOABS 0.6 07/04/2024 0500   EOSABS 0.0 07/04/2024 0500   BASOSABS 0.0 07/04/2024 0500    BMET    Component Value Date/Time   NA 134 (L) 07/04/2024 0500   NA 141 09/13/2014 1531   K 4.4 07/04/2024 0500   CL 97 (L) 07/04/2024 0500   CO2 28 07/04/2024 0500   GLUCOSE 122 (H) 07/04/2024 0500   BUN 26 (H) 07/04/2024 0500   BUN 12 09/13/2014 1531   CREATININE 0.85 07/04/2024 0500   CALCIUM 9.8 07/04/2024 0500   GFRNONAA >60 07/04/2024 0500   GFRAA >60 10/22/2018 0616    BNP    Component Value Date/Time   BNP 89.7 07/02/2024 1108    ProBNP No results found for: PROBNP  Imaging: DG Chest Portable 1 View Result Date: 07/02/2024 CLINICAL DATA:  Shortness of breath EXAM: PORTABLE CHEST 1 VIEW COMPARISON:  06/18/2024 FINDINGS: Cardiac enlargement is stable. Aortic atherosclerotic calcifications. Right mid and left mid lung zone areas of subsegmental scarring versus atelectasis appears similar to the previous exam. Bandlike area of  atelectasis within the right lung base is M proved in the interval. There is new blunting of the right costophrenic angle. Increased interstitial markings within the left lung are unchanged from the prior exam. No focal consolidative change identified. IMPRESSION: 1. New blunting of the right costophrenic angle compatible with small right pleural effusion. 2. Stable cardiomegaly. 3. Stable areas of subsegmental scarring versus atelectasis within the right mid and left mid lung zone. 4. Asymmetric increase interstitial markings are noted within the left lung which correlate with the findings from the high-resolution CT of the chest from 06/24/2024. This is a nonspecific finding and may be inflammatory or infectious in etiology. Underlying interstitial lung disease not excluded. See high-resolution CT report from 06/24/2024 for further details. Electronically Signed   By: Waddell Calk M.D.   On: 07/02/2024 11:28   CT CHEST HIGH RESOLUTION Result Date: 06/29/2024 CLINICAL DATA:  Evaluate for interstitial lung disease, multifocal pneumonia and shortness of breath EXAM: CT CHEST WITHOUT CONTRAST TECHNIQUE: Multidetector CT imaging of the chest was performed following the standard protocol without intravenous contrast. High resolution imaging of the lungs, as well as inspiratory and expiratory imaging, was performed. RADIATION DOSE REDUCTION: This exam was performed according to the departmental dose-optimization program which includes automated exposure control, adjustment of the mA and/or kV according to patient size and/or use of iterative reconstruction technique. COMPARISON:  CT angio chest May 15, 2024 FINDINGS: Cardiovascular: The heart size is normal. Atherosclerotic calcifications of the aorta and coronary arteries. No pericardial fluid. Mediastinum/Nodes: Multi station subcentimeter and pericentimeter mediastinal lymph nodes. Thyroid  gland, trachea, and esophagus demonstrate no significant findings.  Lungs/Pleura: Bilateral upper lobe predominant centrilobular emphysematous changes. Diffuse interlobular septal thickening. Multifocal areas of reticular scarring/platelike atelectasis and subsegmental consolidation with associated bronchiectasis and bronchiolectasis involving the right middle and right lower lobes, are grossly stable to prior. Interval increase in left lower lobe bandlike scarring /platelike atelectasis (4/124). Previously seen lingular consolidation is now near completely resolved. There is mosaic ground-glass attenuation accentuated on expiratory phase suggestive of air trapping and small airway disease. Bronchial and bronchiolar wall thickening. Scattered pulmonary micronodules are stable to prior for example left upper lobe (4/39, anterior aspect of right upper lobe (4/61, right lower lobe 4/118, left lower lobe 4/107 No new nodules.  No pleural effusion. Upper Abdomen: Query left kidney upper pole hypodense lesion incompletely assessed on current exam likely a simple cortical cyst which does not require imaging follow-up Musculoskeletal: No suspicious osseous lesion. IMPRESSION: Multifocal consolidation/foci of atelectasis with associated bronchiectasis and bronchiolectasis, mosaic ground-glass attenuation and interlobular septal thickening. Findings can be seen the setting of multifocal infection/inflammation ( organizing pneumonias), chronic hypersensitivity pneumonitis, fibrotic NSIP, and sarcoidosis. Correlate with clinical findings and follow-up to ensure stability/resolution. Scattered pulmonary micro nodules are stable to prior. No new nodules. Follow-up with chest CT in 1 year to ensure stability. Aortic Atherosclerosis (ICD10-I70.0) and Emphysema (ICD10-J43.9). Electronically Signed   By: Megan  Zare M.D.   On: 06/29/2024 20:01     Assessment & Plan:   1. Leg swelling (Primary) - Comp Met (CMET); Future - Brain natriuretic peptide; Future - D-Dimer, Quantitative; Future  2.  Organizing pneumonia (HCC)  3.  Chronic hypoxic respiratory failure, on home oxygen  therapy (HCC)  4. Chronic diastolic CHF (congestive heart failure) (HCC)   Assessment and Plan    Organizing Pneumonia Currently managed with prednisone , which has improved respiratory symptoms and oxygen  saturation from 83-84% to 98%. - Continue current prednisone  regimen - Communicate with Dr. Kara regarding management  Congestive Heart Failure Chronic condition, possible contribution to current leg swelling. No increased dyspnea or oxygen  requirements. - Consider increasing Lasix  dosage based on lab results - Advise fluid restriction to 1.5-2 liters per day - Recommend use of compression stockings and leg elevation  Leg Swelling Bilateral leg swelling extending from feet to calves, noted for about a week. Possible causes include congestive heart failure exacerbation or side effect of chronic prednisone  use. Low suspicion for deep vein thrombosis. - Order lab work including kidney function, liver function, BNP and D-dimer test - If D-dimer is positive, consider ultrasound of legs - Advise on compression stockings and leg elevation - Monitor and adjust Lasix  dosage as needed   Almarie LELON Ferrari, NP 07/19/2024

## 2024-07-19 NOTE — Telephone Encounter (Signed)
 FYI Only or Action Required?: FYI only for provider.  Patient is followed in Pulmonology for COPD and pulmonary fibrosis, last seen on 07/06/2024 by Kara Dorn NOVAK, MD.  Called Nurse Triage reporting Leg Swelling, Foot Swelling, Leg Pain, and Foot Pain.  Symptoms began a week ago.  Interventions attempted: Prescription medications: prednisone , lasix  and Home oxygen  use.  Symptoms are: gradually worsening.  Triage Disposition: See HCP Within 4 Hours (Or PCP Triage)  Patient/caregiver understands and will follow disposition?: Yes      Copied from CRM [id number]. Topic: Clinical - Red Word Triage >> Jul 19, 2024  8:20 AM [name] wrote: Red Word that prompted transfer to Nurse Triage: Patient [phone] states legs and feet is swelling has been going on for a week, a lot of pressure and pain, unsure if it's from the Prednisone . Patient wants Dr. Luann consult. Please advise. Reason for Disposition  SEVERE leg swelling (e.g., swelling extends above knee, entire leg is swollen, weeping fluid)  Answer Assessment - Initial Assessment Questions Do you monitor your oxygen  levels? Yes If yes, What is your reading (oxygen  level) today? 94-95%, if put oxygen  on it's 98% Supplemental oxygen  3L, pretty much around the clock since hospital in May  What is your usual oxygen  saturation reading?  (Note: Pulmonary O2 sats should be 90% or greater) 94-95%  1. ONSET: When did the swelling start? (e.g., minutes, hours, days)     A week 2. LOCATION: What part of the leg is swollen?  Are both legs swollen or just one leg?     Feet to thighs 3. SEVERITY: How bad is the swelling? (e.g., localized; mild, moderate, severe)     Terrible, looks like elephant feet, no pitting, can't get a shoe on 4. REDNESS: Is there redness or signs of infection?     No redness or warmth 5. PAIN: Is the swelling painful to touch? If Yes, ask: How painful is it?   (Scale 1-10; mild, moderate or  severe)     Painful all the time, 7/10 6. FEVER: Do you have a fever? If Yes, ask: What is it, how was it measured, and when did it start?      no 7. CAUSE: What do you think is causing the leg swelling?     Unsure if from prednisone  8. MEDICAL HISTORY: Do you have a history of blood clots (e.g., DVT), cancer, heart failure, kidney disease, or liver failure?     Pt denies all 9. RECURRENT SYMPTOM: Have you had leg swelling before? If Yes, ask: When was the last time? What happened that time?     Yes, when took prednisone  before in June or so Taking 20 mg lasix  but not doing anything from Dr. Theophilus when in hospital last time Think might need to increase dose but want to talk to Dr. Kara 10. OTHER SYMPTOMS: Do you have any other symptoms? (e.g., chest pain, difficulty breathing)       No chest pain or more SOB than normal, the breathing's pretty good   Been taking prednisone  since hospital, was taking 4 a day then switched to 3 a day on her own about a week ago to see if would help the swelling but not really Weight gain 5-7 lbs this week Can walk but not feeling too hot to do so   Advised pt be examined in next 4 hours, scheduled with pulm office 1 hour from now. Advised pt call back if any worsening or new symptoms.  Protocols used: Leg Swelling and Edema-A-AH

## 2024-07-20 LAB — D-DIMER, QUANTITATIVE: D-Dimer, Quant: 0.36 ug{FEU}/mL (ref <0.50)

## 2024-07-21 ENCOUNTER — Telehealth: Payer: Self-pay

## 2024-07-21 NOTE — Telephone Encounter (Signed)
 Copied from CRM [id number]. Topic: Clinical - Red Word Triage >> Jul 20, 2024 11:00 AM Benton KIDD wrote: Patient say she is waiting for a call from the nurse or doctor concerning the swollen in her feet and legs . Please give patient a call      Spoke with patient and she would like JD to address this and I told I will send a message and when I hear back I will call her back     Patient verbalized under standing

## 2024-07-22 NOTE — Telephone Encounter (Signed)
 Patient states she is already doing all suggestions and nothing is helping the swelling does come down a little at night -   how much more should she increase her her lasix  ?

## 2024-07-25 ENCOUNTER — Telehealth: Payer: Self-pay

## 2024-07-25 ENCOUNTER — Other Ambulatory Visit: Payer: Self-pay

## 2024-07-25 ENCOUNTER — Telehealth: Payer: Self-pay | Admitting: Pulmonary Disease

## 2024-07-25 DIAGNOSIS — Z9981 Dependence on supplemental oxygen: Secondary | ICD-10-CM

## 2024-07-25 DIAGNOSIS — M7989 Other specified soft tissue disorders: Secondary | ICD-10-CM

## 2024-07-25 DIAGNOSIS — J439 Emphysema, unspecified: Secondary | ICD-10-CM

## 2024-07-25 NOTE — Telephone Encounter (Signed)
 Copied from CRM [id number]. Topic: Clinical - Lab/Test Results >> Jul 25, 2024  2:40 PM Breanna Ramirez wrote: Reason for CRM: Patient informed of referral for cardiology, patient inquiring about doseage of lasix . Please advise patient.    Spoke with patient feet swelling has come down a lot     Patient verbalized understanding    NFN-

## 2024-07-25 NOTE — Telephone Encounter (Signed)
 Tried to call patient no answer , referral sent

## 2024-07-25 NOTE — Telephone Encounter (Signed)
 Tried to call patient no answer, (Dr. Kara sent referral to cardiology).    NFN-

## 2024-07-25 NOTE — Telephone Encounter (Signed)
 Copied from CRM [id number]. Topic: Medical Record Request - Other >> Jul 25, 2024  2:47 PM Joesph PARAS wrote: Reason for CRM: Harlene with patient's workplace HR is calling to inquire about clarifications regarding patient restrictions on FMLA. 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   States will need clarifications in writing at Fax [phone].

## 2024-07-26 ENCOUNTER — Ambulatory Visit (INDEPENDENT_AMBULATORY_CARE_PROVIDER_SITE_OTHER): Admitting: Pulmonary Disease

## 2024-07-26 ENCOUNTER — Encounter: Payer: Self-pay | Admitting: Pulmonary Disease

## 2024-07-26 VITALS — BP 142/66 | HR 108 | Temp 98.0°F | Ht 64.0 in | Wt 139.2 lb

## 2024-07-26 DIAGNOSIS — G4734 Idiopathic sleep related nonobstructive alveolar hypoventilation: Secondary | ICD-10-CM | POA: Diagnosis not present

## 2024-07-26 DIAGNOSIS — J449 Chronic obstructive pulmonary disease, unspecified: Secondary | ICD-10-CM | POA: Diagnosis not present

## 2024-07-26 DIAGNOSIS — J8489 Other specified interstitial pulmonary diseases: Secondary | ICD-10-CM | POA: Diagnosis not present

## 2024-07-26 MED ORDER — BUDESONIDE 0.5 MG/2ML IN SUSP: 0.5000 mg | Freq: Two times a day (BID) | RESPIRATORY_TRACT | 6 refills | Status: AC

## 2024-07-26 MED ORDER — ARFORMOTEROL TARTRATE 15 MCG/2ML IN NEBU: 15.0000 ug | INHALATION_SOLUTION | Freq: Two times a day (BID) | RESPIRATORY_TRACT | 6 refills | Status: AC

## 2024-07-26 NOTE — Telephone Encounter (Signed)
 Patient has appointment today to speak with provider

## 2024-07-26 NOTE — Telephone Encounter (Signed)
 Will assess today during OV.  JD

## 2024-07-26 NOTE — Patient Instructions (Addendum)
 Taper prednisone  to 20mg  daily (2 tablets), take for 20 days, then taper to 15mg  daily  Continue taking bactrim  tablet 3 days per week  Increase lasix  to 60mg  daily for 7 days, then back to 40mg  daily  Continue budesonide  nebulizer twice daily Continue brovana  nebulizer twice daily Continue yupelri  nebulizer daily Continue Ohtuvayre  nebulizer twice daily  Follow up in 4-6 weeks, can double book or use a block slot.

## 2024-07-26 NOTE — Progress Notes (Signed)
 Synopsis: Referred in February 2023 for wheezing by Andriette Null, NP  Subjective:   PATIENT ID: Breanna Ramirez GENDER: female DOB: 11-17-1956, MRN: [id number]  HPI  Chief Complaint  Patient presents with   Medical Management of Chronic Issues    Wants a walk    Breanna Ramirez is a 68 year old woman, former smoker with COPD and GERD who returns to pulmonary clinic for COPD and organizing pneumonia.   She reports feeling better since last visit. She has been on 30mg  of prednisone  daily and bactrim  3 days per week. She has significant leg swelling despite taking 40mg  of lasix  daily. CMP showed stable creatinine a week ago.   Simple walk today did not indicate and oxygen  desaturations below 88% with ambulation. She has been using oxygen  at night.   She is hoping to return to work as her disability did not get approved.  OV 07/06/24 Patient was admitted 7/5 for progressive worsening of her breathing. She was started on high dose steroid taper on 7/3 for concern of organizing pneumonia. Patient was seen in hospital by pulmonary team and she did not want bronchoscopy or surgical lung biopsies performed.  She is feeling better since discharge. She is currently on 40mg  prednisone  daily and taking bactrim  1 tab 3 days per week. She thinks she is starting to feel better. She is using 4L of oxygen  now.   Disability paper work completed. Unable to determine if she will be able to return to work. Plan to treat for 2-3 months with steroids and reassess, but I believe she may likely not be able to return to work after this.   OV 06/13/24 She was admitted 5/18 to 5/21 for on going respiratory symptoms. She had bronchoscopy done 5/19 with significant mucous noted. She had trouble expectorating mucous. Anti-muscarinic medications were stopped to help thin secretions.  BAL cultures remain negative to date. Cytology is negative.  She experiences persistent thick mucus production despite recent  hospitalization and treatment changes. She continues to have difficulty clearing secretions.   She uses a nebulizer and inhaler, recently switching from [facility] to Trelegy, which she finds more effective. Currently, she uses Ohtuvayre  nebulizers with slight improvement. Chest physical therapy, including a chest vest and high salt solution nebulizer treatment, did not yield significant results. She has hypertonic saline at home but has not been using it.  She started using oxygen  after recent hospitalizations and is concerned about her oxygen  dependency and physical limitations.  She has difficulty lifting more than 10lbs. 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   OV 05/04/24 She has persistent wheezing and a non-productive cough since her hospitalization on March 14, 2024. Initially, she experienced pain in the lower right chest, now located in the upper right chest with associated wheezing. Despite treatment with amoxicillin , azithromycin , prednisone , and Augmentin , symptoms have only slightly improved. Ohtuvayre  was prescribed but not yet approved by insurance.  She experiences night sweats, which began before her hospitalization and continue. She denies fever, chills, or difficulty swallowing. No joint pains or muscle aches.  Her current medications include Trelegy, one puff daily, and nebulizer treatments twice a day. She uses a Flutter device regularly and has one remaining prednisone  pill.  She quit smoking in 2019 and occasionally smells smoke from neighbors in her apartment. No current smoking, vaping, or exposure to secondhand smoke within her  home. She has no pets and does not use down pillows or comforters.   OV 08/25/22 ONO since last visit showed she qualified for night time oxygen . She is considering formal sleep study when she has more time away from work.  She was treated  with levaquin  for 5 days and then augmentin  for 10 days in July by her nurse practitioner at work for on going cough.   She reports her breathing is well controlled at this time but continues to have some wheezing.   She has run out of montelukast . She has significant post nasal drainage. GERD is well controlled at this time.   OV 06/02/22 She was admitted 5/21 to 5/24 for COPD exacerbation. She was discharged with supplemental oxygen  3L. Using at night mainly.   She continues to have cough with some sputum production. She has wheezing but it is much improved. She is not quite back to baseline yet. She is concerned about returning to work where it is very hot and humid in [employer]. She is concerned it will aggravate her breathing again.   OV 05/12/22 She was treated for COPD exacerbation at last visit with prednisone  taper with Zpak. She was started on singulair . She continues on symbicort  2 puffs twice dialy and as needed duoneb treatments. She reports relief from the prednisone  taper but of recent she has increased cough, sputum production and wheezing.   PFTs show moderate restriction and diffusion defect. Flow volume loop does appear obstructed.   OV 02/12/22 She reports being treated in September 2022 for pneumonia and later developed COVID-19 infection in late October 2022 in which she was treated with Paxlovid.  She again was sick with pneumonia in December 2022 and responded well to antibiotics, steroids and using Symbicort  inhaler.  She tapered herself off the Symbicort  inhaler in January after she was feeling better.  She started to have wheezing towards the end of January along with shortness of breath.  She denies any cough or sputum production.  She reports that she notices the wheezing mostly at nighttime when going to bed.  She quit smoking in 2019.  She has a 40+ pack year smoking history.  She has significant secondhand smoke exposure in childhood.  She currently works as a Solicitor  at a [employer]  She denies any significant dust or chemical exposures.  She does have significant reflux and seasonal allergies.  She reports springtime is the most difficult season for her due to the pollen.  She denies any increased cough or dysphagia when eating or drinking.  She denies the sensation of food getting stuck in her throat or chest.  Her father had asthma and her maternal grandfather had asthma.  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   Past Medical History:  Diagnosis Date   COPD (chronic obstructive pulmonary disease) (HCC)    Essential hypertension    GERD (gastroesophageal reflux disease)    Pneumonia 05/2017   Respiratory failure with hypoxia (HCC) 05/2017     Family History  Problem Relation Age of Onset   Cancer Father      Social History   Socioeconomic History   Marital status: Divorced    Spouse name: Not on file   Number of children: 1   Years of education: Not on file   Highest education level: Not on file  Occupational History   Occupation: Works in Designer, fashion/clothing.  Tobacco Use  Smoking status: Former    Current packs/day: 0.00    Average packs/day: 1 pack/day for 45.0 years (45.0 ttl pk-yrs)    Types: Cigarettes    Start date: 12/29/1972    Quit date: 12/29/2017    Years since quitting: 6.5   Smokeless tobacco: Never  Vaping Use   Vaping status: Never Used  Substance and Sexual Activity   Alcohol use: No   Drug use: No   Sexual activity: Never    Birth control/protection: Post-menopausal  Other Topics Concern   Not on file  Social History Narrative   Not on file   Social Drivers of Health   Financial Resource Strain: Not on file  Food Insecurity: No Food Insecurity (07/03/2024)   Hunger Vital Sign    Worried About Running Out of Food in the Last Year: Never true    Ran Out of Food in the Last Year: Never true  Transportation Needs: No Transportation Needs (07/03/2024)   PRAPARE  - Administrator, Civil Service (Medical): No    Lack of Transportation (Non-Medical): No  Physical Activity: Not on file  Stress: Not on file  Social Connections: Unknown (07/03/2024)   Social Connection and Isolation Panel    Frequency of Communication with Friends and Family: Patient declined    Frequency of Social Gatherings with Friends and Family: Not on file    Attends Religious Services: Patient declined    Active Member of Clubs or Organizations: Not on file    Attends Banker Meetings: Patient declined    Marital Status: Not on file  Intimate Partner Violence: Not At Risk (07/03/2024)   Humiliation, Afraid, Rape, and Kick questionnaire    Fear of Current or Ex-Partner: No    Emotionally Abused: No    Physically Abused: No    Sexually Abused: No     Allergies  Allergen Reactions   Codeine Nausea And Vomiting   Daliresp  [Roflumilast ] Other (See Comments)    Severe nose bleeds     Outpatient Medications Prior to Visit  Medication Sig Dispense Refill   albuterol  (PROVENTIL ) (2.5 MG/3ML) 0.083% nebulizer solution Take 3 mLs (2.5 mg total) by nebulization every 2 (two) hours as needed for wheezing. 75 mL 0   albuterol  (VENTOLIN  HFA) 108 (90 Base) MCG/ACT inhaler Inhale 2 puffs into the lungs every 6 (six) hours as needed for wheezing or shortness of breath. (Patient taking differently: Inhale 2 puffs into the lungs every 4 (four) hours as needed for wheezing or shortness of breath.) 6.7 g 0   Aspirin -Salicylamide-Caffeine (BC HEADACHE POWDER PO) Take 1 packet by mouth daily as needed (for headaches or back pain).     cholecalciferol (VITAMIN D3) 25 MCG (1000 UNIT) tablet Take 1,000 Units by mouth every other day.     Ensifentrine  (OHTUVAYRE ) 3 MG/2.5ML SUSP Inhale 1 Inhalation into the lungs 2 (two) times daily. (Patient taking differently: Inhale 3 mg into the lungs 2 (two) times daily.) 150 mL 11   esomeprazole  (NEXIUM ) 40 MG capsule Take 1 capsule (40 mg  total) by mouth daily. (Patient taking differently: Take 40 mg by mouth at bedtime.) 90 capsule 4   fluticasone  (FLONASE ) 50 MCG/ACT nasal spray Place 1 spray into both nostrils every evening. (Patient taking differently: Place 1 spray into both nostrils 2 (two) times daily as needed for allergies or rhinitis.) 16 each 11   furosemide  (LASIX ) 20 MG tablet Take 1 tablet (20 mg total) by mouth daily. (Patient taking differently:  Take 20 mg by mouth daily as needed for fluid or edema.) 30 tablet 11   ibuprofen (ADVIL) 200 MG tablet Take 400 mg by mouth every 6 (six) hours as needed for mild pain (pain score 1-3) or headache.     metoprolol  tartrate (LOPRESSOR ) 25 MG tablet Take 0.5 tablets (12.5 mg total) by mouth 2 (two) times daily. 30 tablet 0   montelukast  (SINGULAIR ) 10 MG tablet Take 1 tablet (10 mg total) by mouth at bedtime. 30 tablet 11   OXYGEN  Inhale 3 L/min into the lungs continuous.     predniSONE  (DELTASONE ) 10 MG tablet Take 4 tablets (40 mg total) by mouth daily with breakfast for 14 days, THEN 3 tablets (30 mg total) daily with breakfast for 14 days. 98 tablet 0   predniSONE  (DELTASONE ) 10 MG tablet Take 4 tablets (40 mg total) by mouth daily with breakfast. 56 tablet 0   revefenacin  (YUPELRI ) 175 MCG/3ML nebulizer solution Take 3 mLs (175 mcg total) by nebulization daily. 90 mL 6   simvastatin  (ZOCOR ) 40 MG tablet Take 1 tablet (40 mg total) by mouth at bedtime. 90 tablet 3   sodium chloride  HYPERTONIC 3 % nebulizer solution Take by nebulization as needed for other. (Patient taking differently: Take 4 mLs by nebulization as needed (as directed for COPD- related symptoms).) 750 mL 12   sulfamethoxazole -trimethoprim  (BACTRIM  DS) 800-160 MG tablet Take 1 tablet by mouth 3 (three) times a week. 12 tablet 0   arformoterol  (BROVANA ) 15 MCG/2ML NEBU Take 2 mLs (15 mcg total) by nebulization 2 (two) times daily. 120 mL 0   budesonide  (PULMICORT ) 0.5 MG/2ML nebulizer solution Take 2 mLs (0.5 mg  total) by nebulization 2 (two) times daily. 120 mL 0   No facility-administered medications prior to visit.   Review of Systems  Constitutional:  Negative for chills, fever, malaise/fatigue and weight loss.  HENT:  Negative for congestion, sinus pain and sore throat.   Eyes: Negative.   Respiratory:  Positive for shortness of breath. Negative for cough, hemoptysis, sputum production and wheezing.   Cardiovascular:  Positive for leg swelling. Negative for chest pain, palpitations, orthopnea and claudication.  Gastrointestinal:  Negative for abdominal pain, heartburn, nausea and vomiting.  Genitourinary: Negative.   Musculoskeletal:  Negative for joint pain and myalgias.  Skin:  Negative for rash.  Neurological:  Negative for weakness and headaches.  Endo/Heme/Allergies: Negative.   Psychiatric/Behavioral: Negative.      Objective:   Vitals:   07/26/24 1516  BP: (!) 142/66  Pulse: (!) 108  Temp: 98 F (36.7 C)  TempSrc: Temporal  SpO2: 96%  Weight: 139 lb 3.2 oz (63.1 kg)  Height: 5' 4 (1.626 m)   Physical Exam Constitutional:      General: She is not in acute distress.    Appearance: She is not ill-appearing.  HENT:     Head: Normocephalic and atraumatic.  Eyes:     General: No scleral icterus.    Conjunctiva/sclera: Conjunctivae normal.  Cardiovascular:     Rate and Rhythm: Normal rate and regular rhythm.     Pulses: Normal pulses.     Heart sounds: Normal heart sounds. No murmur heard. Pulmonary:     Effort: Pulmonary effort is normal.     Breath sounds: No wheezing, rhonchi or rales.  Musculoskeletal:     Right lower leg: Edema present.     Left lower leg: Edema present.  Skin:    General: Skin is warm and dry.  Neurological:  Mental Status: She is alert.    CBC    Component Value Date/Time   WBC 9.7 07/04/2024 0500   RBC 3.30 (L) 07/04/2024 0500   HGB 7.8 (L) 07/04/2024 0500   HCT 27.2 (L) 07/04/2024 0500   PLT 479 (H) 07/04/2024 0500   MCV 82.4  07/04/2024 0500   MCH 23.6 (L) 07/04/2024 0500   MCHC 28.7 (L) 07/04/2024 0500   RDW 17.6 (H) 07/04/2024 0500   LYMPHSABS 0.4 (L) 07/04/2024 0500   MONOABS 0.6 07/04/2024 0500   EOSABS 0.0 07/04/2024 0500   BASOSABS 0.0 07/04/2024 0500      Latest Ref Rng & Units 07/19/2024   10:15 AM 07/04/2024    5:00 AM 07/03/2024    6:01 AM  BMP  Glucose 70 - 99 mg/dL 892  877  888   BUN 6 - 23 mg/dL 22  26  19    Creatinine 0.40 - 1.20 mg/dL 9.06  9.14  9.07   Sodium 135 - 145 mEq/L 139  134  133   Potassium 3.5 - 5.1 mEq/L 4.1  4.4  3.7   Chloride 96 - 112 mEq/L 101  97  96   CO2 19 - 32 mEq/L 28  28  24    Calcium 8.4 - 10.5 mg/dL 8.8  9.8  9.1    Chest imaging: CT Chest 06/24/24 Multifocal consolidation/foci of atelectasis with associated bronchiectasis and bronchiolectasis, mosaic ground-glass attenuation and interlobular septal thickening. Findings can be seen the setting of multifocal infection/inflammation ( organizing pneumonias), chronic hypersensitivity pneumonitis, fibrotic NSIP, and sarcoidosis. Correlate with clinical findings and follow-up to ensure stability/resolution.   Scattered pulmonary micro nodules are stable to prior. No new nodules. Follow-up with chest CT in 1 year to ensure stability.   Aortic Atherosclerosis (ICD10-I70.0) and Emphysema (ICD10-J43.9).  PFT:    Latest Ref Rng & Units 05/12/2022   10:47 AM  PFT Results  FVC-Pre L 1.67   FVC-Predicted Pre % 51   FVC-Post L 1.72   FVC-Predicted Post % 52   Pre FEV1/FVC % % 77   Post FEV1/FCV % % 79   FEV1-Pre L 1.28   FEV1-Predicted Pre % 51   FEV1-Post L 1.36   DLCO uncorrected ml/min/mmHg 8.38   DLCO UNC% % 41   DLCO corrected ml/min/mmHg 8.38   DLCO COR %Predicted % 41   DLVA Predicted % 56   TLC L 4.06   TLC % Predicted % 78   RV % Predicted % 104     Labs:  Path:  Echo:  Heart Catheterization:   MBS 05/17/24 Clinical Impression: Clinical Impression: Patient presents with a mild  oropharyngeal dysphagia with an esophageal component as well. Anterior hyoid excursion was partial in completion and although epiglottic inversion and laryngeal vestibule closure appeared complete, they were delayed, resulting in instances of penetration above the vocal cords which cleared laryngeal vestibule (PAS 2) with thin liquids. PES opening was partial in duration and distention, resulting in diffuse pharyngeal residuals with solids and liquids. Dry swallow helped to clear pharyngeal residuals, though patient did not exhibit sensation to any pharyngeal residuals. 13mm barium tablet transited through pharynx, PES and esophagus without difficulty. During esophageal sweep, barium stasis, questionable narrowing at portion of distal esophagus with retrograde movement that stayed within the esophagus  Assessment & Plan:   Organizing pneumonia (HCC)  Chronic obstructive pulmonary disease, unspecified COPD type (HCC) - Plan: arformoterol  (BROVANA ) 15 MCG/2ML NEBU, budesonide  (PULMICORT ) 0.5 MG/2ML nebulizer solution  Nocturnal hypoxemia  Discussion: Breanna Ramirez is a 68 year old woman, former smoker with COPD and GERD who returns to pulmonary clinic for COPD.   Organizing Pneumonia - continue prolonged steroid taper - taper from 30mg  to 20mg  daily for 20 days, then down to 15mg  daily - take bactrim  DS 1 tab 3 days per weeks until taking less than 20mg  daily of prednisone  - she has been weaned off oxygen  today as her ambulatory O2 saturations have greatly improved.   Chronic Obstructive Pulmonary Disease (COPD) with chronic bronchitis - continue budesonide  twice daily - continue brovana  twice daily - continue yupelri  daily - continue ohtuvayre   Chronic Hypoxemic Respiratory Failure - resolved during the day and with ambulation - continue 2L at night with sleep  Work note provided for patient to return to work.  Follow up in 1 month  Dorn Chill, MD [facility] Office: [phone]   Current Outpatient Medications:    albuterol  (PROVENTIL ) (2.5 MG/3ML) 0.083% nebulizer solution, Take 3 mLs (2.5 mg total) by nebulization every 2 (two) hours as needed for wheezing., Disp: 75 mL, Rfl: 0   albuterol  (VENTOLIN  HFA) 108 (90 Base) MCG/ACT inhaler, Inhale 2 puffs into the lungs every 6 (six) hours as needed for wheezing or shortness of breath. (Patient taking differently: Inhale 2 puffs into the lungs every 4 (four) hours as needed for wheezing or shortness of breath.), Disp: 6.7 g, Rfl: 0   Aspirin -Salicylamide-Caffeine (BC HEADACHE POWDER PO), Take 1 packet by mouth daily as needed (for headaches or back pain)., Disp: , Rfl:    cholecalciferol (VITAMIN D3) 25 MCG (1000 UNIT) tablet, Take 1,000 Units by mouth every other day., Disp: , Rfl:    Ensifentrine  (OHTUVAYRE ) 3 MG/2.5ML SUSP, Inhale 1 Inhalation into the lungs 2 (two) times daily. (Patient taking differently: Inhale 3 mg into the lungs 2 (two) times daily.), Disp: 150 mL, Rfl: 11   esomeprazole  (NEXIUM ) 40 MG capsule, Take 1 capsule (40 mg total) by mouth daily. (Patient taking differently: Take 40 mg by mouth at bedtime.), Disp: 90 capsule, Rfl: 4   fluticasone  (FLONASE ) 50 MCG/ACT nasal spray, Place 1 spray into both nostrils every evening. (Patient taking differently: Place 1 spray into both nostrils 2 (two) times daily as needed for allergies or rhinitis.), Disp: 16 each, Rfl: 11   furosemide  (LASIX ) 20 MG tablet, Take 1 tablet (20 mg total) by mouth daily. (Patient taking differently: Take 20 mg by mouth daily as needed for fluid or edema.), Disp: 30 tablet, Rfl: 11   ibuprofen (ADVIL) 200 MG tablet, Take 400 mg by mouth every 6 (six) hours as needed for mild pain (pain score 1-3) or headache., Disp: , Rfl:    metoprolol  tartrate (LOPRESSOR ) 25 MG tablet, Take 0.5 tablets (12.5 mg total) by mouth 2 (two) times daily., Disp: 30 tablet, Rfl: 0   montelukast  (SINGULAIR ) 10 MG tablet, Take 1 tablet  (10 mg total) by mouth at bedtime., Disp: 30 tablet, Rfl: 11   OXYGEN , Inhale 3 L/min into the lungs continuous., Disp: , Rfl:    predniSONE  (DELTASONE ) 10 MG tablet, Take 4 tablets (40 mg total) by mouth daily with breakfast for 14 days, THEN 3 tablets (30 mg total) daily with breakfast for 14 days., Disp: 98 tablet, Rfl: 0   predniSONE  (DELTASONE ) 10 MG tablet, Take 4 tablets (40 mg total) by mouth daily with breakfast., Disp: 56 tablet, Rfl: 0   revefenacin  (YUPELRI ) 175 MCG/3ML nebulizer solution, Take 3 mLs (175 mcg total)  by nebulization daily., Disp: 90 mL, Rfl: 6   simvastatin  (ZOCOR ) 40 MG tablet, Take 1 tablet (40 mg total) by mouth at bedtime., Disp: 90 tablet, Rfl: 3   sodium chloride  HYPERTONIC 3 % nebulizer solution, Take by nebulization as needed for other. (Patient taking differently: Take 4 mLs by nebulization as needed (as directed for COPD- related symptoms).), Disp: 750 mL, Rfl: 12   sulfamethoxazole -trimethoprim  (BACTRIM  DS) 800-160 MG tablet, Take 1 tablet by mouth 3 (three) times a week., Disp: 12 tablet, Rfl: 0   arformoterol  (BROVANA ) 15 MCG/2ML NEBU, Take 2 mLs (15 mcg total) by nebulization 2 (two) times daily., Disp: 120 mL, Rfl: 6   budesonide  (PULMICORT ) 0.5 MG/2ML nebulizer solution, Take 2 mLs (0.5 mg total) by nebulization 2 (two) times daily., Disp: 120 mL, Rfl: 6

## 2024-07-27 ENCOUNTER — Ambulatory Visit: Payer: Self-pay | Admitting: Primary Care

## 2024-08-19 ENCOUNTER — Telehealth: Payer: Self-pay | Admitting: Internal Medicine

## 2024-08-19 ENCOUNTER — Telehealth: Payer: Self-pay | Admitting: Pulmonary Disease

## 2024-08-19 DIAGNOSIS — I5032 Chronic diastolic (congestive) heart failure: Secondary | ICD-10-CM

## 2024-08-19 NOTE — Telephone Encounter (Signed)
 Copied from CRM [id number]. Topic: Referral - Request for Referral >> Aug 18, 2024  2:45 PM Celestine FALCON wrote: Did the patient discuss referral with their provider in the last year? Yes (If No - schedule appointment) (If Yes - send message)  Appointment offered? No; pt already has an appt with Dr. Kara, but needs one for a cardiologist per the conversation they had in the hospital.   Type of order/referral and detailed reason for visit: Pt was in the hospital added left side heart failure (went due to COPD flair up).  Preference of office, provider, location: No   If referral order, have you been seen by this specialty before? No (If Yes, this issue or another issue? When? Where?  Can we respond through MyChart? Yes   Jenna a nurse practitioner at the pt's employment called to check on the referral for the pt, but I didn't see one in her chart. I put in a referral request.

## 2024-08-19 NOTE — Telephone Encounter (Signed)
 E2C2 took a call from the PT's workplace about Dr. Kara referring her to another specialist. [employer] did a referral form for the PT.SABRA

## 2024-08-22 NOTE — Telephone Encounter (Signed)
 Pt is aware referral has been made. Nfn

## 2024-08-22 NOTE — Telephone Encounter (Signed)
 Referral to cardiology made.   JD

## 2024-08-30 NOTE — Telephone Encounter (Signed)
 No notes on encounter - Closing

## 2024-09-01 ENCOUNTER — Encounter: Payer: Self-pay | Admitting: Pulmonary Disease

## 2024-09-01 ENCOUNTER — Ambulatory Visit

## 2024-09-01 ENCOUNTER — Ambulatory Visit (INDEPENDENT_AMBULATORY_CARE_PROVIDER_SITE_OTHER): Admitting: Pulmonary Disease

## 2024-09-01 VITALS — BP 124/58 | HR 104 | Ht 64.0 in | Wt 138.0 lb

## 2024-09-01 DIAGNOSIS — J449 Chronic obstructive pulmonary disease, unspecified: Secondary | ICD-10-CM | POA: Diagnosis not present

## 2024-09-01 DIAGNOSIS — J9611 Chronic respiratory failure with hypoxia: Secondary | ICD-10-CM

## 2024-09-01 DIAGNOSIS — J8489 Other specified interstitial pulmonary diseases: Secondary | ICD-10-CM

## 2024-09-01 DIAGNOSIS — J189 Pneumonia, unspecified organism: Secondary | ICD-10-CM

## 2024-09-01 DIAGNOSIS — I5032 Chronic diastolic (congestive) heart failure: Secondary | ICD-10-CM

## 2024-09-01 DIAGNOSIS — Z9981 Dependence on supplemental oxygen: Secondary | ICD-10-CM

## 2024-09-01 LAB — BRAIN NATRIURETIC PEPTIDE: Pro B Natriuretic peptide (BNP): 102 pg/mL — ABNORMAL HIGH (ref 0.0–100.0)

## 2024-09-01 MED ORDER — AZITHROMYCIN 250 MG PO TABS: ORAL_TABLET | ORAL | 0 refills | Status: DC

## 2024-09-01 MED ORDER — PREDNISONE 10 MG PO TABS: ORAL_TABLET | ORAL | 0 refills | Status: AC

## 2024-09-01 MED ORDER — AMOXICILLIN-POT CLAVULANATE 875-125 MG PO TABS: 1.0000 | ORAL_TABLET | Freq: Two times a day (BID) | ORAL | 0 refills | Status: DC

## 2024-09-01 NOTE — Patient Instructions (Signed)
 We will check chest x-ray and labs today  Start augmentin  1 tab twice daily  Start Zpak antibiotic  Start steroid taper: 40mg  daily x 3 days 30mg  daily x 3 days 20mg  daily x 3 days 10mg  daily x 3 days  Then continue 10mg  daily for 14 days, then taper to 5mg  daily  Will message you with instructions on the lasix  once I have the labs back.  Continue budesonide  nebulizer twice daily Continue brovana  nebulizer twice daily Continue yupelri  nebulizer daily Continue Ohtuvayre  nebulizer twice daily  Follow up in 4-6 weeks, can double book or use a block slot.

## 2024-09-01 NOTE — Progress Notes (Signed)
 Synopsis: Referred in February 2023 for wheezing by Andriette Null, NP  Subjective:   PATIENT ID: Breanna Ramirez GENDER: female DOB: [birth year], MRN: [id number]  HPI  Chief Complaint  Patient presents with   Medical Management of Chronic Issues    Pt states SOB, congestion x 4 days, thinks she has pneumonia     [name] is a 68 year old woman, former smoker with COPD and GERD who returns to pulmonary clinic for COPD and organizing pneumonia.   Since Sunday night, she experiences worsening dyspnea and increased cough with minimal sputum production. Fevers occurred on Sunday and Monday nights. She resumed oxygen  use during ambulation due to severe dyspnea but does not require it at rest.  Significant bilateral lower extremity edema persists despite 40 mg of Lasix  daily. Her legs remain swollen and painful.  She increased her prednisone  from 10 mg to 20 mg this morning without immediate improvement.  She is scheduled to receive iron  on Monday and is concerned about her kidney and liver function due to her medication regimen.  OV 07/26/24 She reports feeling better since last visit. She has been on 30mg  of prednisone  daily and bactrim  3 days per week. She has significant leg swelling despite taking 40mg  of lasix  daily. CMP showed stable creatinine a week ago.   Simple walk today did not indicate and oxygen  desaturations below 88% with ambulation. She has been using oxygen  at night.   She is hoping to return to work as her disability did not get approved.  OV 07/06/24 Patient was admitted 7/5 for progressive worsening of her breathing. She was started on high dose steroid taper on 7/3 for concern of organizing pneumonia. Patient was seen in hospital by pulmonary team and she did not want bronchoscopy or surgical lung biopsies performed.  She is feeling better since discharge. She is currently on 40mg  prednisone  daily and taking bactrim  1 tab 3 days per week. She thinks she is starting to  feel better. She is using 4L of oxygen  now.   Disability paper work completed. Unable to determine if she will be able to return to work. Plan to treat for 2-3 months with steroids and reassess, but I believe she may likely not be able to return to work after this.   OV 06/13/24 She was admitted 5/18 to 5/21 for on going respiratory symptoms. She had bronchoscopy done 5/19 with significant mucous noted. She had trouble expectorating mucous. Anti-muscarinic medications were stopped to help thin secretions.  BAL cultures remain negative to date. Cytology is negative.  She experiences persistent thick mucus production despite recent hospitalization and treatment changes. She continues to have difficulty clearing secretions.   She uses a nebulizer and inhaler, recently switching from [facility] to Trelegy, which she finds more effective. Currently, she uses Ohtuvayre  nebulizers with slight improvement. Chest physical therapy, including a chest vest and high salt solution nebulizer treatment, did not yield significant results. She has hypertonic saline at home but has not been using it.  She started using oxygen  after recent hospitalizations and is concerned about her oxygen  dependency and physical limitations.  She has difficulty lifting more than 10lbs. 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   OV 05/04/24 She has persistent wheezing and a non-productive cough since her hospitalization on March 14, 2024. Initially, she experienced pain in the lower right chest, now located  in the upper right chest with associated wheezing. Despite treatment with amoxicillin , azithromycin , prednisone , and Augmentin , symptoms have only slightly improved. Ohtuvayre  was prescribed but not yet approved by insurance.  She experiences night sweats, which began before her hospitalization and continue. She denies  fever, chills, or difficulty swallowing. No joint pains or muscle aches.  Her current medications include Trelegy, one puff daily, and nebulizer treatments twice a day. She uses a Flutter device regularly and has one remaining prednisone  pill.  She quit smoking in 2019 and occasionally smells smoke from neighbors in her apartment. No current smoking, vaping, or exposure to secondhand smoke within her home. She has no pets and does not use down pillows or comforters.   OV 08/25/22 ONO since last visit showed she qualified for night time oxygen . She is considering formal sleep study when she has more time away from work.  She was treated with levaquin  for 5 days and then augmentin  for 10 days in July by her nurse practitioner at work for on going cough.   She reports her breathing is well controlled at this time but continues to have some wheezing.   She has run out of montelukast . She has significant post nasal drainage. GERD is well controlled at this time.   OV 06/02/22 She was admitted 5/21 to 5/24 for COPD exacerbation. She was discharged with supplemental oxygen  3L. Using at night mainly.   She continues to have cough with some sputum production. She has wheezing but it is much improved. She is not quite back to baseline yet. She is concerned about returning to work where it is very hot and humid in [employer]. She is concerned it will aggravate her breathing again.   OV 05/12/22 She was treated for COPD exacerbation at last visit with prednisone  taper with Zpak. She was started on singulair . She continues on symbicort  2 puffs twice dialy and as needed duoneb treatments. She reports relief from the prednisone  taper but of recent she has increased cough, sputum production and wheezing.   PFTs show moderate restriction and diffusion defect. Flow volume loop does appear obstructed.   OV 02/12/22 She reports being treated in September 2022 for pneumonia and later developed COVID-19 infection  in late October 2022 in which she was treated with Paxlovid.  She again was sick with pneumonia in December 2022 and responded well to antibiotics, steroids and using Symbicort  inhaler.  She tapered herself off the Symbicort  inhaler in January after she was feeling better.  She started to have wheezing towards the end of January along with shortness of breath.  She denies any cough or sputum production.  She reports that she notices the wheezing mostly at nighttime when going to bed.  She quit smoking in 2019.  She has a 40+ pack year smoking history.  She has significant secondhand smoke exposure in childhood.  She currently works as a Solicitor at a [employer]  She denies any significant dust or chemical exposures.  She does have significant reflux and seasonal allergies.  She reports springtime is the most difficult season for her due to the pollen.  She denies any increased cough or dysphagia when eating or drinking.  She denies the sensation of food getting stuck in her throat or chest.  Her father had asthma and her maternal grandfather had asthma.  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   Past Medical History:  Diagnosis Date  COPD (chronic obstructive pulmonary disease) (HCC)    Essential hypertension    GERD (gastroesophageal reflux disease)    Pneumonia 05/2017   Respiratory failure with hypoxia (HCC) 05/2017     Family History  Problem Relation Age of Onset   Cancer Father      Social History   Socioeconomic History   Marital status: Divorced    Spouse name: Not on file   Number of children: 1   Years of education: Not on file   Highest education level: Not on file  Occupational History   Occupation: Works in Designer, fashion/clothing.  Tobacco Use   Smoking status: Former    Current packs/day: 0.00    Average packs/day: 1 pack/day for 45.0 years (45.0 ttl pk-yrs)    Types: Cigarettes    Start date: 12/29/1972    Quit date:  12/29/2017    Years since quitting: 6.6   Smokeless tobacco: Never  Vaping Use   Vaping status: Never Used  Substance and Sexual Activity   Alcohol use: No   Drug use: No   Sexual activity: Never    Birth control/protection: Post-menopausal  Other Topics Concern   Not on file  Social History Narrative   Not on file   Social Drivers of Health   Financial Resource Strain: Not on file  Food Insecurity: No Food Insecurity (07/03/2024)   Hunger Vital Sign    Worried About Running Out of Food in the Last Year: Never true    Ran Out of Food in the Last Year: Never true  Transportation Needs: No Transportation Needs (07/03/2024)   PRAPARE - Administrator, Civil Service (Medical): No    Lack of Transportation (Non-Medical): No  Physical Activity: Not on file  Stress: Not on file  Social Connections: Unknown (07/03/2024)   Social Connection and Isolation Panel    Frequency of Communication with Friends and Family: Patient declined    Frequency of Social Gatherings with Friends and Family: Not on file    Attends Religious Services: Patient declined    Active Member of Clubs or Organizations: Not on file    Attends Banker Meetings: Patient declined    Marital Status: Not on file  Intimate Partner Violence: Not At Risk (07/03/2024)   Humiliation, Afraid, Rape, and Kick questionnaire    Fear of Current or Ex-Partner: No    Emotionally Abused: No    Physically Abused: No    Sexually Abused: No     Allergies  Allergen Reactions   Codeine Nausea And Vomiting   Daliresp  [Roflumilast ] Other (See Comments)    Severe nose bleeds     Outpatient Medications Prior to Visit  Medication Sig Dispense Refill   albuterol  (PROVENTIL ) (2.5 MG/3ML) 0.083% nebulizer solution Take 3 mLs (2.5 mg total) by nebulization every 2 (two) hours as needed for wheezing. 75 mL 0   albuterol  (VENTOLIN  HFA) 108 (90 Base) MCG/ACT inhaler Inhale 2 puffs into the lungs every 6 (six) hours as  needed for wheezing or shortness of breath. (Patient taking differently: Inhale 2 puffs into the lungs every 4 (four) hours as needed for wheezing or shortness of breath.) 6.7 g 0   arformoterol  (BROVANA ) 15 MCG/2ML NEBU Take 2 mLs (15 mcg total) by nebulization 2 (two) times daily. 120 mL 6   Aspirin -Salicylamide-Caffeine (BC HEADACHE POWDER PO) Take 1 packet by mouth daily as needed (for headaches or back pain).     budesonide  (PULMICORT ) 0.5 MG/2ML nebulizer solution Take 2  mLs (0.5 mg total) by nebulization 2 (two) times daily. 120 mL 6   cholecalciferol (VITAMIN D3) 25 MCG (1000 UNIT) tablet Take 1,000 Units by mouth every other day.     Ensifentrine  (OHTUVAYRE ) 3 MG/2.5ML SUSP Inhale 1 Inhalation into the lungs 2 (two) times daily. (Patient taking differently: Inhale 3 mg into the lungs 2 (two) times daily.) 150 mL 11   esomeprazole  (NEXIUM ) 40 MG capsule Take 1 capsule (40 mg total) by mouth daily. (Patient taking differently: Take 40 mg by mouth at bedtime.) 90 capsule 4   fluticasone  (FLONASE ) 50 MCG/ACT nasal spray Place 1 spray into both nostrils every evening. (Patient taking differently: Place 1 spray into both nostrils 2 (two) times daily as needed for allergies or rhinitis.) 16 each 11   furosemide  (LASIX ) 20 MG tablet Take 1 tablet (20 mg total) by mouth daily. (Patient taking differently: Take 20 mg by mouth daily as needed for fluid or edema.) 30 tablet 11   ibuprofen (ADVIL) 200 MG tablet Take 400 mg by mouth every 6 (six) hours as needed for mild pain (pain score 1-3) or headache.     metoprolol  tartrate (LOPRESSOR ) 25 MG tablet Take 0.5 tablets (12.5 mg total) by mouth 2 (two) times daily. 30 tablet 0   montelukast  (SINGULAIR ) 10 MG tablet Take 1 tablet (10 mg total) by mouth at bedtime. 30 tablet 11   OXYGEN  Inhale 3 L/min into the lungs continuous.     potassium chloride  (KLOR-CON ) 10 MEQ tablet Take 10 mEq by mouth daily.     predniSONE  (DELTASONE ) 10 MG tablet Take 4 tablets  (40 mg total) by mouth daily with breakfast. 56 tablet 0   revefenacin  (YUPELRI ) 175 MCG/3ML nebulizer solution Take 3 mLs (175 mcg total) by nebulization daily. 90 mL 6   simvastatin  (ZOCOR ) 40 MG tablet Take 1 tablet (40 mg total) by mouth at bedtime. 90 tablet 3   sodium chloride  HYPERTONIC 3 % nebulizer solution Take by nebulization as needed for other. (Patient taking differently: Take 4 mLs by nebulization as needed (as directed for COPD- related symptoms).) 750 mL 12   sulfamethoxazole -trimethoprim  (BACTRIM  DS) 800-160 MG tablet Take 1 tablet by mouth 3 (three) times a week. 12 tablet 0   No facility-administered medications prior to visit.   Review of Systems  Constitutional:  Positive for fever. Negative for chills, malaise/fatigue and weight loss.  HENT:  Negative for congestion, sinus pain and sore throat.   Eyes: Negative.   Respiratory:  Positive for cough, sputum production, shortness of breath and wheezing. Negative for hemoptysis.   Cardiovascular:  Positive for leg swelling. Negative for chest pain, palpitations, orthopnea and claudication.  Gastrointestinal:  Negative for abdominal pain, heartburn, nausea and vomiting.  Genitourinary: Negative.   Musculoskeletal:  Negative for joint pain and myalgias.  Skin:  Negative for rash.  Neurological:  Negative for weakness and headaches.  Endo/Heme/Allergies: Negative.   Psychiatric/Behavioral: Negative.      Objective:   Vitals:   09/01/24 1448  BP: (!) 124/58  Pulse: (!) 104  SpO2: 97%  Weight: 138 lb (62.6 kg)  Height: 5' 4 (1.626 m)  PF: (!) 3 L/min   Physical Exam Constitutional:      General: She is not in acute distress.    Appearance: She is not ill-appearing.  HENT:     Head: Normocephalic and atraumatic.  Eyes:     General: No scleral icterus.    Conjunctiva/sclera: Conjunctivae normal.  Cardiovascular:  Rate and Rhythm: Normal rate and regular rhythm.     Pulses: Normal pulses.     Heart sounds:  Normal heart sounds. No murmur heard. Pulmonary:     Effort: Pulmonary effort is normal.     Breath sounds: Wheezing present. No rhonchi or rales.  Musculoskeletal:     Right lower leg: Edema present.     Left lower leg: Edema present.  Skin:    General: Skin is warm and dry.  Neurological:     Mental Status: She is alert.    CBC    Component Value Date/Time   WBC 9.7 07/04/2024 0500   RBC 3.30 (L) 07/04/2024 0500   HGB 7.8 (L) 07/04/2024 0500   HCT 27.2 (L) 07/04/2024 0500   PLT 479 (H) 07/04/2024 0500   MCV 82.4 07/04/2024 0500   MCH 23.6 (L) 07/04/2024 0500   MCHC 28.7 (L) 07/04/2024 0500   RDW 17.6 (H) 07/04/2024 0500   LYMPHSABS 0.4 (L) 07/04/2024 0500   MONOABS 0.6 07/04/2024 0500   EOSABS 0.0 07/04/2024 0500   BASOSABS 0.0 07/04/2024 0500      Latest Ref Rng & Units 07/19/2024   10:15 AM 07/04/2024    5:00 AM 07/03/2024    6:01 AM  BMP  Glucose 70 - 99 mg/dL 892  877  888   BUN 6 - 23 mg/dL 22  26  19    Creatinine 0.40 - 1.20 mg/dL 9.06  9.14  9.07   Sodium 135 - 145 mEq/L 139  134  133   Potassium 3.5 - 5.1 mEq/L 4.1  4.4  3.7   Chloride 96 - 112 mEq/L 101  97  96   CO2 19 - 32 mEq/L 28  28  24    Calcium 8.4 - 10.5 mg/dL 8.8  9.8  9.1    Chest imaging: CT Chest 06/24/24 Multifocal consolidation/foci of atelectasis with associated bronchiectasis and bronchiolectasis, mosaic ground-glass attenuation and interlobular septal thickening. Findings can be seen the setting of multifocal infection/inflammation ( organizing pneumonias), chronic hypersensitivity pneumonitis, fibrotic NSIP, and sarcoidosis. Correlate with clinical findings and follow-up to ensure stability/resolution.   Scattered pulmonary micro nodules are stable to prior. No new nodules. Follow-up with chest CT in 1 year to ensure stability.   Aortic Atherosclerosis (ICD10-I70.0) and Emphysema (ICD10-J43.9).  PFT:    Latest Ref Rng & Units 05/12/2022   10:47 AM  PFT Results  FVC-Pre L 1.67    FVC-Predicted Pre % 51   FVC-Post L 1.72   FVC-Predicted Post % 52   Pre FEV1/FVC % % 77   Post FEV1/FCV % % 79   FEV1-Pre L 1.28   FEV1-Predicted Pre % 51   FEV1-Post L 1.36   DLCO uncorrected ml/min/mmHg 8.38   DLCO UNC% % 41   DLCO corrected ml/min/mmHg 8.38   DLCO COR %Predicted % 41   DLVA Predicted % 56   TLC L 4.06   TLC % Predicted % 78   RV % Predicted % 104     Labs:  Path:  Echo:  Heart Catheterization:   MBS 05/17/24 Clinical Impression: Clinical Impression: Patient presents with a mild oropharyngeal dysphagia with an esophageal component as well. Anterior hyoid excursion was partial in completion and although epiglottic inversion and laryngeal vestibule closure appeared complete, they were delayed, resulting in instances of penetration above the vocal cords which cleared laryngeal vestibule (PAS 2) with thin liquids. PES opening was partial in duration and distention, resulting in diffuse pharyngeal residuals with  solids and liquids. Dry swallow helped to clear pharyngeal residuals, though patient did not exhibit sensation to any pharyngeal residuals. 13mm barium tablet transited through pharynx, PES and esophagus without difficulty. During esophageal sweep, barium stasis, questionable narrowing at portion of distal esophagus with retrograde movement that stayed within the esophagus  Assessment & Plan:   No diagnosis found.  Discussion: Breanna Ramirez is a 68 year old woman, former smoker with COPD and GERD who returns to pulmonary clinic for COPD and Organizing pneumonia.   Organizing Pneumonia Concern for regression of condition as steroid taper progressed.  - Provide 12 day steroid taper, gaoing back to 40mg  daily for 3 days and tapering down by 10mg  every 3 days until back to 10mg  daily - check ESR/CRP today - check chest x-ray  Possible Bacterial Pnuemonia - Start augmentin  1 tab twice daily for 7 days and Zpak  Chronic Obstructive Pulmonary Disease  (COPD) with chronic bronchitis - continue budesonide  twice daily - continue brovana  twice daily - continue yupelri  daily - continue ohtuvayre  - steroid taper as above for possible exacerbation along with antibiotics  Chronic Hypoxemic Respiratory Failure - She has returned to using supplemental oxygen  after being weaned off during the day and with ambulation at last visit. - continue 2L at night with sleep  Possible Heart Failure - has had elevated BNP labs in past - continue lasix  40mg  daily - check BNP today along with CBC and CMP  Follow up in 4-6 weeks  Dorn Chill, MD  Pulmonary & Critical Care Office: [phone]   Current Outpatient Medications:    albuterol  (PROVENTIL ) (2.5 MG/3ML) 0.083% nebulizer solution, Take 3 mLs (2.5 mg total) by nebulization every 2 (two) hours as needed for wheezing., Disp: 75 mL, Rfl: 0   albuterol  (VENTOLIN  HFA) 108 (90 Base) MCG/ACT inhaler, Inhale 2 puffs into the lungs every 6 (six) hours as needed for wheezing or shortness of breath. (Patient taking differently: Inhale 2 puffs into the lungs every 4 (four) hours as needed for wheezing or shortness of breath.), Disp: 6.7 g, Rfl: 0   arformoterol  (BROVANA ) 15 MCG/2ML NEBU, Take 2 mLs (15 mcg total) by nebulization 2 (two) times daily., Disp: 120 mL, Rfl: 6   Aspirin -Salicylamide-Caffeine (BC HEADACHE POWDER PO), Take 1 packet by mouth daily as needed (for headaches or back pain)., Disp: , Rfl:    budesonide  (PULMICORT ) 0.5 MG/2ML nebulizer solution, Take 2 mLs (0.5 mg total) by nebulization 2 (two) times daily., Disp: 120 mL, Rfl: 6   cholecalciferol (VITAMIN D3) 25 MCG (1000 UNIT) tablet, Take 1,000 Units by mouth every other day., Disp: , Rfl:    Ensifentrine  (OHTUVAYRE ) 3 MG/2.5ML SUSP, Inhale 1 Inhalation into the lungs 2 (two) times daily. (Patient taking differently: Inhale 3 mg into the lungs 2 (two) times daily.), Disp: 150 mL, Rfl: 11   esomeprazole  (NEXIUM ) 40 MG capsule, Take  1 capsule (40 mg total) by mouth daily. (Patient taking differently: Take 40 mg by mouth at bedtime.), Disp: 90 capsule, Rfl: 4   fluticasone  (FLONASE ) 50 MCG/ACT nasal spray, Place 1 spray into both nostrils every evening. (Patient taking differently: Place 1 spray into both nostrils 2 (two) times daily as needed for allergies or rhinitis.), Disp: 16 each, Rfl: 11   furosemide  (LASIX ) 20 MG tablet, Take 1 tablet (20 mg total) by mouth daily. (Patient taking differently: Take 20 mg by mouth daily as needed for fluid or edema.), Disp: 30 tablet, Rfl: 11   ibuprofen (ADVIL) 200 MG tablet,  Take 400 mg by mouth every 6 (six) hours as needed for mild pain (pain score 1-3) or headache., Disp: , Rfl:    metoprolol  tartrate (LOPRESSOR ) 25 MG tablet, Take 0.5 tablets (12.5 mg total) by mouth 2 (two) times daily., Disp: 30 tablet, Rfl: 0   montelukast  (SINGULAIR ) 10 MG tablet, Take 1 tablet (10 mg total) by mouth at bedtime., Disp: 30 tablet, Rfl: 11   OXYGEN , Inhale 3 L/min into the lungs continuous., Disp: , Rfl:    potassium chloride  (KLOR-CON ) 10 MEQ tablet, Take 10 mEq by mouth daily., Disp: , Rfl:    predniSONE  (DELTASONE ) 10 MG tablet, Take 4 tablets (40 mg total) by mouth daily with breakfast., Disp: 56 tablet, Rfl: 0   revefenacin  (YUPELRI ) 175 MCG/3ML nebulizer solution, Take 3 mLs (175 mcg total) by nebulization daily., Disp: 90 mL, Rfl: 6   simvastatin  (ZOCOR ) 40 MG tablet, Take 1 tablet (40 mg total) by mouth at bedtime., Disp: 90 tablet, Rfl: 3   sodium chloride  HYPERTONIC 3 % nebulizer solution, Take by nebulization as needed for other. (Patient taking differently: Take 4 mLs by nebulization as needed (as directed for COPD- related symptoms).), Disp: 750 mL, Rfl: 12   sulfamethoxazole -trimethoprim  (BACTRIM  DS) 800-160 MG tablet, Take 1 tablet by mouth 3 (three) times a week., Disp: 12 tablet, Rfl: 0

## 2024-09-02 LAB — COMPREHENSIVE METABOLIC PANEL WITH GFR
ALT: 10 U/L (ref 0–35)
AST: 13 U/L (ref 0–37)
Albumin: 3.9 g/dL (ref 3.5–5.2)
Alkaline Phosphatase: 53 U/L (ref 39–117)
BUN: 14 mg/dL (ref 6–23)
CO2: 32 meq/L (ref 19–32)
Calcium: 8.6 mg/dL (ref 8.4–10.5)
Chloride: 93 meq/L — ABNORMAL LOW (ref 96–112)
Creatinine, Ser: 0.92 mg/dL (ref 0.40–1.20)
GFR: 63.84 mL/min (ref >60.00)
Glucose, Bld: 113 mg/dL — ABNORMAL HIGH (ref 70–99)
Potassium: 3.4 meq/L — ABNORMAL LOW (ref 3.5–5.1)
Sodium: 139 meq/L (ref 135–145)
Total Bilirubin: 0.2 mg/dL (ref 0.2–1.2)
Total Protein: 6.3 g/dL (ref 6.0–8.3)

## 2024-09-02 LAB — CBC WITH DIFFERENTIAL/PLATELET
Basophils Absolute: 0.1 K/uL (ref 0.0–0.1)
Basophils Relative: 1 % (ref 0.0–3.0)
Eosinophils Absolute: 0 K/uL (ref 0.0–0.7)
Eosinophils Relative: 0 % (ref 0.0–5.0)
HCT: 28.3 % — ABNORMAL LOW (ref 36.0–46.0)
Hemoglobin: 8.5 g/dL — ABNORMAL LOW (ref 12.0–15.0)
Lymphocytes Relative: 2.9 % — ABNORMAL LOW (ref 12.0–46.0)
Lymphs Abs: 0.3 K/uL — ABNORMAL LOW (ref 0.7–4.0)
MCHC: 30.1 g/dL (ref 30.0–36.0)
MCV: 73.5 fl — ABNORMAL LOW (ref 78.0–100.0)
Monocytes Absolute: 0.3 K/uL (ref 0.1–1.0)
Monocytes Relative: 3 % (ref 3.0–12.0)
Neutro Abs: 8.3 K/uL — ABNORMAL HIGH (ref 1.4–7.7)
Neutrophils Relative %: 93.1 % — ABNORMAL HIGH (ref 43.0–77.0)
Platelets: 438 K/uL — ABNORMAL HIGH (ref 150.0–400.0)
RBC: 3.85 Mil/uL — ABNORMAL LOW (ref 3.87–5.11)
RDW: 21.7 % — ABNORMAL HIGH (ref 11.5–15.5)
WBC: 8.9 K/uL (ref 4.0–10.5)

## 2024-09-02 LAB — C-REACTIVE PROTEIN: CRP: 8.8 mg/dL (ref 0.5–20.0)

## 2024-09-02 LAB — SEDIMENTATION RATE: Sed Rate: 9 mm/h (ref 0–30)

## 2024-09-05 ENCOUNTER — Inpatient Hospital Stay: Attending: Nurse Practitioner

## 2024-09-05 ENCOUNTER — Inpatient Hospital Stay (HOSPITAL_BASED_OUTPATIENT_CLINIC_OR_DEPARTMENT_OTHER): Admitting: Nurse Practitioner

## 2024-09-05 VITALS — BP 134/54 | HR 73 | Temp 97.8°F | Resp 17 | Wt 135.7 lb

## 2024-09-05 DIAGNOSIS — Z806 Family history of leukemia: Secondary | ICD-10-CM | POA: Insufficient documentation

## 2024-09-05 DIAGNOSIS — Z808 Family history of malignant neoplasm of other organs or systems: Secondary | ICD-10-CM | POA: Diagnosis not present

## 2024-09-05 DIAGNOSIS — Z801 Family history of malignant neoplasm of trachea, bronchus and lung: Secondary | ICD-10-CM | POA: Insufficient documentation

## 2024-09-05 DIAGNOSIS — D509 Iron deficiency anemia, unspecified: Secondary | ICD-10-CM | POA: Diagnosis not present

## 2024-09-05 DIAGNOSIS — E538 Deficiency of other specified B group vitamins: Secondary | ICD-10-CM

## 2024-09-05 DIAGNOSIS — Z87891 Personal history of nicotine dependence: Secondary | ICD-10-CM | POA: Insufficient documentation

## 2024-09-05 DIAGNOSIS — I110 Hypertensive heart disease with heart failure: Secondary | ICD-10-CM | POA: Diagnosis not present

## 2024-09-05 DIAGNOSIS — Z79899 Other long term (current) drug therapy: Secondary | ICD-10-CM | POA: Insufficient documentation

## 2024-09-05 DIAGNOSIS — I509 Heart failure, unspecified: Secondary | ICD-10-CM

## 2024-09-05 DIAGNOSIS — J449 Chronic obstructive pulmonary disease, unspecified: Secondary | ICD-10-CM | POA: Diagnosis not present

## 2024-09-05 DIAGNOSIS — K219 Gastro-esophageal reflux disease without esophagitis: Secondary | ICD-10-CM

## 2024-09-05 LAB — RETIC PANEL
Immature Retic Fract: 21.2 % — ABNORMAL HIGH (ref 2.3–15.9)
RBC.: 3.53 MIL/uL — ABNORMAL LOW (ref 3.87–5.11)
Retic Count, Absolute: 117.9 K/uL (ref 19.0–186.0)
Retic Ct Pct: 3.3 % — ABNORMAL HIGH (ref 0.4–3.1)
Reticulocyte Hemoglobin: 18.1 pg — ABNORMAL LOW (ref >27.9)

## 2024-09-05 LAB — IRON AND IRON BINDING CAPACITY (CC-WL,HP ONLY)
Iron: 12 ug/dL — ABNORMAL LOW (ref 28–170)
Saturation Ratios: 2 % — ABNORMAL LOW (ref 10.4–31.8)
TIBC: 524 ug/dL — ABNORMAL HIGH (ref 250–450)
UIBC: 512 ug/dL — ABNORMAL HIGH (ref 148–442)

## 2024-09-05 LAB — VITAMIN B12: Vitamin B-12: 455 pg/mL (ref 180–914)

## 2024-09-05 LAB — FERRITIN: Ferritin: 11 ng/mL (ref 11–307)

## 2024-09-05 NOTE — Progress Notes (Unsigned)
 [facility]   [phone] [phone]   Clinic New consult Note   Patient Care Team: Austin Mutton, MD as PCP - General (Internal Medicine) Kara Dorn NOVAK, MD as Consulting Physician (Pulmonary Disease) Date of Service: 09/05/2024  CHIEF COMPLAINTS/PURPOSE OF CONSULTATION:  Iron  deficiency anemia, referred by Andriette Null, NP   HISTORY OF PRESENTING ILLNESS:  Breanna Ramirez 68 y.o. female with PMH including CHF, HTN, COPD, GERD, HL is here because of anemia.  She was first found to have anemia 10/17/2018 Hgb 7.8-9.2 during hospitalization for pneumonia and hypoxic respiratory failure, ferritin 6, serum iron  13, 3% saturation, and TIBC 507.  Folate and B12 are normal.  She was transfused 1 unit packed red cells and received iron  dextran infusion 10/22/2018.  There is a gap in her records until next CBC 10/31/2021 with Hgb 11.1. Anemia range remained 10.9 - 8.7 over the next 2 years. She was admitted back to hospital 09/21/2023 with cough and dyspnea, Hgb 7.4 - 9.1 during that time. Repeat anemia work up showed ferritin 12, serum iron  24, 5% saturation, TIBC 531 again with normal folate and B12.  She received an IV iron  infusion.  Hgb improved to 10.8 in 03/14/24. Admitted back 05/15/2024 for PNA and hypoxia, admission Hgb normal 12.3 but dropped to 9.3 during the hospital course, ferritin 17, serum iron  17, 4% saturation, TIBC 406. This time patient found to have low B12 at 161. She continues to have low hgb after discharge with Hgb 7.8 range in July 2025 while she continued outpatient management for pneumonia and required supplemental oxygen , most recent CBC 09/01/24 with Hgb 8.5.  She has been on oral iron  by mouth daily since July, tolerating well except moderate constipation she manages with Colace.  Denies intestinal surgeries.  She takes [facility] powder for headaches but no other anticoagulation.   Socially she is single with 1 child, does office clerk work.  Denies alcohol  or drug use, smoked cigarettes for 40 years but quit 15 years ago.  She is mainly a vegetarian.  Last colonoscopy 15 years ago.  Not up-to-date on mammogram.  Denies family history of anemia but has leukemia, lung, and esophageal cancer in the family.  Today she presents by herself.  She was in the hospital in June, recovered and returned to work in July, but has not felt well since August with worsening exertional dyspnea on 3 L oxygen  and no energy.  She has a cough when she is supine.  She is completing antibiotics and steroids for pneumonia per Dr. Kara. She has occasional night sweats, no weight loss, denies pain.  Denies change in bowel habits, bloody stools, or other bleeding.    MEDICAL HISTORY:  Past Medical History:  Diagnosis Date   COPD (chronic obstructive pulmonary disease) (HCC)    Essential hypertension    GERD (gastroesophageal reflux disease)    Pneumonia 05/2017   Respiratory failure with hypoxia (HCC) 05/2017    SURGICAL HISTORY: Past Surgical History:  Procedure Laterality Date   BRONCHIAL WASHINGS  05/16/2024   Procedure: IRRIGATION, BRONCHUS;  Surgeon: Claudene Toribio BROCKS, MD;  Location: [facility];  Service: Endoscopy;;   ENDOMETRIAL ABLATION     FLEXIBLE BRONCHOSCOPY Bilateral 05/16/2024   Procedure: ELLIOTT SIDE;  Surgeon: Claudene Toribio BROCKS, MD;  Location: [facility];  Service: Endoscopy;  Laterality: Bilateral;    SOCIAL HISTORY: Social History   Socioeconomic History   Marital status: Divorced    Spouse name: Not on file  Number of children: 1   Years of education: Not on file   Highest education level: Not on file  Occupational History   Occupation: Works in Designer, fashion/clothing.  Tobacco Use   Smoking status: Former    Current packs/day: 0.00    Average packs/day: 1 pack/day for 45.0 years (45.0 ttl pk-yrs)    Types: Cigarettes    Start date: 12/29/1972    Quit date: 12/29/2017    Years since quitting: 6.6   Smokeless tobacco: Never  Vaping Use    Vaping status: Never Used  Substance and Sexual Activity   Alcohol use: No   Drug use: No   Sexual activity: Never    Birth control/protection: Post-menopausal  Other Topics Concern   Not on file  Social History Narrative   Not on file   Social Drivers of Health   Financial Resource Strain: Not on file  Food Insecurity: No Food Insecurity (09/05/2024)   Hunger Vital Sign    Worried About Running Out of Food in the Last Year: Never true    Ran Out of Food in the Last Year: Never true  Transportation Needs: No Transportation Needs (09/05/2024)   PRAPARE - Administrator, Civil Service (Medical): No    Lack of Transportation (Non-Medical): No  Physical Activity: Not on file  Stress: Not on file  Social Connections: Unknown (07/03/2024)   Social Connection and Isolation Panel    Frequency of Communication with Friends and Family: Patient declined    Frequency of Social Gatherings with Friends and Family: Not on file    Attends Religious Services: Patient declined    Active Member of Clubs or Organizations: Not on file    Attends Banker Meetings: Patient declined    Marital Status: Not on file  Intimate Partner Violence: Not At Risk (09/05/2024)   Humiliation, Afraid, Rape, and Kick questionnaire    Fear of Current or Ex-Partner: No    Emotionally Abused: No    Physically Abused: No    Sexually Abused: No    FAMILY HISTORY: Family History  Problem Relation Age of Onset   Cancer Father     ALLERGIES:  is allergic to codeine and daliresp  [roflumilast ].  MEDICATIONS:  Current Outpatient Medications  Medication Sig Dispense Refill   albuterol  (VENTOLIN  HFA) 108 (90 Base) MCG/ACT inhaler Inhale 2 puffs into the lungs every 6 (six) hours as needed for wheezing or shortness of breath. (Patient taking differently: Inhale 2 puffs into the lungs every 4 (four) hours as needed for wheezing or shortness of breath.) 6.7 g 0   amoxicillin -clavulanate (AUGMENTIN )  875-125 MG tablet Take 1 tablet by mouth 2 (two) times daily. 14 tablet 0   arformoterol  (BROVANA ) 15 MCG/2ML NEBU Take 2 mLs (15 mcg total) by nebulization 2 (two) times daily. 120 mL 6   Aspirin -Salicylamide-Caffeine (BC HEADACHE POWDER PO) Take 1 packet by mouth daily as needed (for headaches or back pain).     azithromycin  (ZITHROMAX ) 250 MG tablet Take as directed 6 tablet 0   budesonide  (PULMICORT ) 0.5 MG/2ML nebulizer solution Take 2 mLs (0.5 mg total) by nebulization 2 (two) times daily. 120 mL 6   cholecalciferol (VITAMIN D3) 25 MCG (1000 UNIT) tablet Take 1,000 Units by mouth every other day.     Ensifentrine  (OHTUVAYRE ) 3 MG/2.5ML SUSP Inhale 1 Inhalation into the lungs 2 (two) times daily. (Patient taking differently: Inhale 3 mg into the lungs 2 (two) times daily.) 150 mL 11   esomeprazole  (  NEXIUM ) 40 MG capsule Take 1 capsule (40 mg total) by mouth daily. (Patient taking differently: Take 40 mg by mouth at bedtime.) 90 capsule 4   fluticasone  (FLONASE ) 50 MCG/ACT nasal spray Place 1 spray into both nostrils every evening. (Patient taking differently: Place 1 spray into both nostrils 2 (two) times daily as needed for allergies or rhinitis.) 16 each 11   ibuprofen (ADVIL) 200 MG tablet Take 400 mg by mouth every 6 (six) hours as needed for mild pain (pain score 1-3) or headache.     metoprolol  tartrate (LOPRESSOR ) 25 MG tablet Take 0.5 tablets (12.5 mg total) by mouth 2 (two) times daily. 30 tablet 0   montelukast  (SINGULAIR ) 10 MG tablet Take 1 tablet (10 mg total) by mouth at bedtime. 30 tablet 11   OXYGEN  Inhale 3 L/min into the lungs continuous.     potassium chloride  (KLOR-CON ) 10 MEQ tablet Take 10 mEq by mouth daily.     predniSONE  (DELTASONE ) 10 MG tablet Take 4 tablets (40 mg total) by mouth daily with breakfast for 3 days, THEN 3 tablets (30 mg total) daily with breakfast for 3 days, THEN 2 tablets (20 mg total) daily with breakfast for 3 days, THEN 1 tablet (10 mg total) daily  with breakfast for 3 days. 30 tablet 0   revefenacin  (YUPELRI ) 175 MCG/3ML nebulizer solution Take 3 mLs (175 mcg total) by nebulization daily. 90 mL 6   simvastatin  (ZOCOR ) 40 MG tablet Take 1 tablet (40 mg total) by mouth at bedtime. 90 tablet 3   sodium chloride  HYPERTONIC 3 % nebulizer solution Take by nebulization as needed for other. (Patient taking differently: Take 4 mLs by nebulization as needed (as directed for COPD- related symptoms).) 750 mL 12   albuterol  (PROVENTIL ) (2.5 MG/3ML) 0.083% nebulizer solution Take 3 mLs (2.5 mg total) by nebulization every 2 (two) hours as needed for wheezing. 75 mL 0   furosemide  (LASIX ) 20 MG tablet Take 1 tablet (20 mg total) by mouth daily. (Patient taking differently: Take 20 mg by mouth daily as needed for fluid or edema.) 30 tablet 11   predniSONE  (DELTASONE ) 10 MG tablet Take 4 tablets (40 mg total) by mouth daily with breakfast. 56 tablet 0   No current facility-administered medications for this visit.    REVIEW OF SYSTEMS:   Constitutional: Denies fevers, chills (+) periodic night sweats (+) fatigue Eyes: Denies blurriness of vision, double vision or watery eyes Ears, nose, mouth, throat, and face: Denies epistaxis, mucositis or sore throat Respiratory:  (+) COPD (+) pneumonia Cardiovascular: Denies palpitation, chest discomfort (+) CHF (+) lower extremity swelling Gastrointestinal:  Denies nausea, heartburn, hematochezia, or change in bowel habits Skin: Denies abnormal skin rashes Lymphatics: Denies new lymphadenopathy (+) bruising Neurological:Denies numbness, tingling or new weaknesses (+) headaches Behavioral/Psych: Mood is stable, no new changes  All other systems were reviewed with the patient and are negative.  PHYSICAL EXAMINATION: ECOG PERFORMANCE STATUS: 2 - Symptomatic, <50% confined to bed  Vitals:   09/05/24 1302  BP: (!) 134/54  Pulse: 73  Resp: 17  Temp: 97.8 F (36.6 C)  SpO2: 93%   Filed Weights   09/05/24 1302   Weight: 135 lb 11.2 oz (61.6 kg)    GENERAL:alert, no distress and comfortable SKIN: Ecchymosis to bilateral forearms; no rashes or significant lesions EYES: sclera clear NECK: Without mass LYMPH:  no palpable cervical, supraclavicular, or axillary lymphadenopathy LUNGS: decreased, expiratory wheezes, normal breathing effort on 3 L o2 HEART: regular rate &  rhythm, mild bilateral lower extremity edema ABDOMEN: abdomen soft, non-tender and normal bowel sounds Musculoskeletal:no cyanosis of digits and no clubbing  PSYCH: alert & oriented x 3 with fluent speech NEURO: no focal motor/sensory deficits  LABORATORY DATA:  I have reviewed the data as listed    Latest Ref Rng & Units 09/01/2024    3:35 PM 07/04/2024    5:00 AM 07/03/2024    6:01 AM  CBC  WBC 4.0 - 10.5 K/uL 8.9  9.7  13.3   Hemoglobin 12.0 - 15.0 g/dL 8.5 Repeated and verified X2.  7.8  7.9   Hematocrit 36.0 - 46.0 % 28.3  27.2  26.3   Platelets 150.0 - 400.0 K/uL 438.0  479  426        Latest Ref Rng & Units 09/01/2024    3:35 PM 07/19/2024   10:15 AM 07/04/2024    5:00 AM  CMP  Glucose 70 - 99 mg/dL 886  892  877   BUN 6 - 23 mg/dL 14  22  26    Creatinine 0.40 - 1.20 mg/dL 9.07  9.06  9.14   Sodium 135 - 145 mEq/L 139  139  134   Potassium 3.5 - 5.1 mEq/L 3.4  4.1  4.4   Chloride 96 - 112 mEq/L 93  101  97   CO2 19 - 32 mEq/L 32  28  28   Calcium 8.4 - 10.5 mg/dL 8.6  8.8  9.8   Total Protein 6.0 - 8.3 g/dL 6.3  6.1    Total Bilirubin 0.2 - 1.2 mg/dL 0.2  0.2    Alkaline Phos 39 - 117 U/L 53  50    AST 0 - 37 U/L 13  16    ALT 0 - 35 U/L 10  27       RADIOGRAPHIC STUDIES: I have personally reviewed the radiological images as listed and agreed with the findings in the report. No results found.  ASSESSMENT & PLAN: 68 year old female   Anemia, secondary to iron  deficiency, history of B12 deficiency, and ?component of anemia of chronic disease -We reviewed her medical record in detail with the patient.  She  developed acute IDA in 2019, required blood transfusion and IV iron .  She has not had GI workup -Anemia has been mild since then, worsens with COPD exacerbation and acute infection -Symptomatic with fatigue and dyspnea (which is multifactorial from COPD and interstitial lung disease).  - Has been on oral iron  for 2 months, tolerating but not responding well thus far.  Given her moderate anemia and symptoms we recommend IV iron  200 mg x 5 at [employer]. for a total of 1 g. Logistics/risk/benefits were discussed. She agrees to proceed. -B12 was low in the past which can contribute to anemia, but is normal now.  She can take oral B12 but does not need injections at this time -We recommend GI workup for IDA if she can tolerate it due to her oxygen  use and other co-morbidities, will CC note to Dr. Kristie -An indolent bone marrow condition has not been ruled out, but less likely given the chronic nature, and no other cytopenias.  We are not recommending a bone marrow biopsy at this time -Repeat labs a month after last IV iron  dose to evaluate her response.   -She understands she may have a component of anemia of chronic disease given her multiple other medical conditions.  If her anemia does not completely resolve with adequate  iron  replacement we may recommend additional testing -Seen with Dr. Lanny   PLAN: -Medical record reviewed -Proceed with IV iron  200 mg x5 at [facility] -Can take oral B12, repeat level is normal now, she does not need injections -Lab 1 months after last IV iron  dose to evaluate response, then periodically -F/up with lab in 6 months  -cc note to Dr. Kristie, PCP, pulm  -Pt seen with Dr. Lanny   Orders Placed This Encounter  Procedures   Ferritin    Standing Status:   Future    Number of Occurrences:   1    Expiration Date:   09/05/2025   Iron  and Iron  Binding Capacity ([facility] only)    Standing Status:   Future    Number of Occurrences:   1    Expiration Date:   09/05/2025   Retic  Panel    Standing Status:   Future    Number of Occurrences:   1    Expiration Date:   09/05/2025   Intrinsic factor antibodies    Standing Status:   Future    Number of Occurrences:   1    Expiration Date:   09/05/2025   Vitamin B12    Standing Status:   Future    Number of Occurrences:   1    Expiration Date:   09/05/2025     All questions were answered. The patient knows to call the clinic with any problems, questions or concerns.     Myrtle Barnhard K Kahle Mcqueen, NP 09/06/24   Addendum I have seen the patient, examined her. I agree with the assessment and and plan and have edited the notes.   Patient is a 68 year old female with multiple comorbidities, including hypertension, COPD, CHF, GERD, was referred for iron  deficient anemia.  She has been anemic since 2019 with a hemoglobin in the range of 7.8-9.9, with low serum iron  and ferritin, consistent with iron  deficient anemia.  MCV is low to normal.  Her WBC and platelet are normal.  Her previous B12 level in May 2025 was also low at 161, which could contribute to her anemia also.  I recommend IV iron  and B12 injections, my suspicion for GI malignancy is low given the chronic anemia.  If her anemia does not resolve with adequate IV iron  and B12 supplement, we will obtain additional anemia workup.  All questions were answered.  Onita Lanny MD 09/05/2024

## 2024-09-06 ENCOUNTER — Ambulatory Visit: Payer: Self-pay | Admitting: Pulmonary Disease

## 2024-09-06 ENCOUNTER — Ambulatory Visit: Payer: Self-pay | Admitting: Nurse Practitioner

## 2024-09-06 ENCOUNTER — Encounter: Payer: Self-pay | Admitting: Nurse Practitioner

## 2024-09-06 ENCOUNTER — Encounter (HOSPITAL_COMMUNITY): Payer: Self-pay | Admitting: Nurse Practitioner

## 2024-09-06 ENCOUNTER — Telehealth (HOSPITAL_COMMUNITY): Payer: Self-pay

## 2024-09-06 ENCOUNTER — Other Ambulatory Visit (HOSPITAL_COMMUNITY): Payer: Self-pay | Admitting: Nurse Practitioner

## 2024-09-06 LAB — INTRINSIC FACTOR ANTIBODIES: Intrinsic Factor: 1.2 [AU]/ml — ABNORMAL HIGH (ref 0.0–1.1)

## 2024-09-06 NOTE — Telephone Encounter (Signed)
 Auth Submission: NO AUTH NEEDED Site of care: Site of care: [facility] Payer: BCBS, Medicare A/B Medication & CPT/J Code(s) submitted: Venofer (Iron  Sucrose) [id number] Diagnosis Code: D50.9 Route of submission (phone, fax, portal):  Phone # Fax # Auth type: Buy/Bill HB Units/visits requested: 200mg  x 5 doses Reference number:  Approval from: 09/06/24 to 12/28/24

## 2024-09-07 ENCOUNTER — Telehealth (HOSPITAL_COMMUNITY): Payer: Self-pay | Admitting: Pharmacy Technician

## 2024-09-07 ENCOUNTER — Other Ambulatory Visit: Payer: Self-pay | Admitting: Nurse Practitioner

## 2024-09-07 ENCOUNTER — Telehealth: Payer: Self-pay

## 2024-09-07 NOTE — Telephone Encounter (Signed)
 Auth Submission: NO AUTH NEEDED Site of care: Site of care: [facility] Payer: BCBS commercial with Medicare A/B Medication & CPT/J Code(s) submitted: Feraheme (ferumoxytol) [id number] Diagnosis Code:  Route of submission (phone, fax, portal):  Phone # Fax # Auth type: Buy/Bill PB Units/visits requested: 510mg  x 2 doses Reference number:  Approval from: 09/07/24 to 11/07/24

## 2024-09-07 NOTE — Telephone Encounter (Signed)
 Auth Submission: NO AUTH NEEDED Site of care: [facility] Payer: BCBS, MEDICARE A/B Medication & CPT/J Code(s) submitted: Feraheme (ferumoxytol) [id number] Diagnosis Code: D50.9 Route of submission (phone, fax, portal): PHONE Phone # Fax # Auth type: Buy/Bill PB Units/visits requested: 510mg  x 2 doses  Reference number: [id number] Approval from: 09/07/24 to 12/28/24  Mbr has Medicare A/B secondary. Preferred med is Feraheme.    Jyquan Kenley, CPhT [facility] Phone: [phone] 09/07/2024

## 2024-09-13 ENCOUNTER — Ambulatory Visit

## 2024-09-13 ENCOUNTER — Inpatient Hospital Stay (HOSPITAL_COMMUNITY): Admission: RE | Admit: 2024-09-13 | Source: Ambulatory Visit

## 2024-09-13 VITALS — BP 109/54 | HR 78 | Temp 98.2°F | Resp 12 | Ht 64.0 in | Wt 139.0 lb

## 2024-09-13 DIAGNOSIS — D509 Iron deficiency anemia, unspecified: Secondary | ICD-10-CM

## 2024-09-13 MED ORDER — SODIUM CHLORIDE 0.9 % IV SOLN
510.0000 mg | Freq: Once | INTRAVENOUS | Status: AC
  Administered 2024-09-13: 510 mg via INTRAVENOUS
  Filled 2024-09-13: qty 17

## 2024-09-13 NOTE — Progress Notes (Signed)
 Diagnosis: Iron  Deficiency Anemia  Provider:  Praveen Mannam MD  Procedure: IV Infusion  IV Type: Peripheral, IV Location: L Antecubital  Feraheme (Ferumoxytol ), Dose: 510 mg  Infusion Start Time: 1531  Infusion Stop Time: 1547  Post Infusion IV Care: Patient declined observation and Peripheral IV Discontinued  Discharge: Condition: Stable, Destination: Home . AVS Provided  Performed by:  Dorella Laster, RN

## 2024-09-13 NOTE — Patient Instructions (Signed)

## 2024-09-15 ENCOUNTER — Encounter (HOSPITAL_COMMUNITY)

## 2024-09-19 ENCOUNTER — Encounter (HOSPITAL_COMMUNITY)

## 2024-09-20 ENCOUNTER — Ambulatory Visit (INDEPENDENT_AMBULATORY_CARE_PROVIDER_SITE_OTHER)

## 2024-09-20 VITALS — BP 119/64 | HR 77 | Temp 98.5°F | Resp 12 | Ht 64.5 in | Wt 136.8 lb

## 2024-09-20 DIAGNOSIS — D509 Iron deficiency anemia, unspecified: Secondary | ICD-10-CM

## 2024-09-20 MED ORDER — SODIUM CHLORIDE 0.9 % IV SOLN
510.0000 mg | Freq: Once | INTRAVENOUS | Status: AC
  Administered 2024-09-20: 510 mg via INTRAVENOUS
  Filled 2024-09-20: qty 17

## 2024-09-20 NOTE — Progress Notes (Signed)
 Diagnosis: Iron  Deficiency Anemia  Provider:  Praveen Mannam MD  Procedure: IV Infusion  IV Type: Peripheral, IV Location: L Antecubital  Feraheme (Ferumoxytol ), Dose: 510 mg  Infusion Start Time: 1530  Infusion Stop Time: 1547  Post Infusion IV Care: Patient declined observation and Peripheral IV Discontinued  Discharge: Condition: Good, Destination: Home . AVS Declined  Performed by:  Leita FORBES Miles, LPN

## 2024-09-21 ENCOUNTER — Encounter (HOSPITAL_COMMUNITY)

## 2024-09-22 ENCOUNTER — Encounter: Payer: Self-pay | Admitting: Pulmonary Disease

## 2024-09-22 ENCOUNTER — Ambulatory Visit (INDEPENDENT_AMBULATORY_CARE_PROVIDER_SITE_OTHER): Admitting: Pulmonary Disease

## 2024-09-22 ENCOUNTER — Ambulatory Visit: Payer: Self-pay | Admitting: Pulmonary Disease

## 2024-09-22 VITALS — BP 123/54 | HR 80 | Ht 64.0 in | Wt 138.0 lb

## 2024-09-22 DIAGNOSIS — J449 Chronic obstructive pulmonary disease, unspecified: Secondary | ICD-10-CM | POA: Diagnosis not present

## 2024-09-22 DIAGNOSIS — I5032 Chronic diastolic (congestive) heart failure: Secondary | ICD-10-CM | POA: Diagnosis not present

## 2024-09-22 DIAGNOSIS — J8489 Other specified interstitial pulmonary diseases: Secondary | ICD-10-CM

## 2024-09-22 DIAGNOSIS — J9611 Chronic respiratory failure with hypoxia: Secondary | ICD-10-CM

## 2024-09-22 DIAGNOSIS — Z9981 Dependence on supplemental oxygen: Secondary | ICD-10-CM

## 2024-09-22 DIAGNOSIS — M7989 Other specified soft tissue disorders: Secondary | ICD-10-CM

## 2024-09-22 LAB — BASIC METABOLIC PANEL WITH GFR
BUN: 17 mg/dL (ref 6–23)
CO2: 26 meq/L (ref 19–32)
Calcium: 9.2 mg/dL (ref 8.4–10.5)
Chloride: 103 meq/L (ref 96–112)
Creatinine, Ser: 0.78 mg/dL (ref 0.40–1.20)
GFR: 77.8 mL/min (ref >60.00)
Glucose, Bld: 87 mg/dL (ref 70–99)
Potassium: 4.1 meq/L (ref 3.5–5.1)
Sodium: 138 meq/L (ref 135–145)

## 2024-09-22 MED ORDER — PREDNISONE 10 MG PO TABS: ORAL_TABLET | ORAL | 0 refills | Status: AC

## 2024-09-22 MED ORDER — FUROSEMIDE 20 MG PO TABS: 20.0000 mg | ORAL_TABLET | Freq: Every day | ORAL | 5 refills | Status: DC

## 2024-09-22 NOTE — Patient Instructions (Addendum)
 We will work on getting you scheduled with the cardiologists  Prednisone  taper: 15mg  daily for 10 days 10mg  daily for 10 days 5 mg daily for 10 days  We will follow up echocardiogram results from 10/10/24  Continue lasix  20mg  daily  Will check labs today to monitor kidney function  Continue budesonide  nebulizer twice daily Continue brovana  nebulizer twice daily Continue yupelri  nebulizer daily Continue Ohtuvayre  nebulizer twice daily  Follow up in 2 months, call sooner if needed

## 2024-09-22 NOTE — Progress Notes (Signed)
 Synopsis: Referred in February 2023 for wheezing by Andriette Null, NP  Subjective:   PATIENT ID: Breanna Ramirez GENDER: female DOB: November 02, 1956, MRN: [id number]  HPI  Chief Complaint  Patient presents with   Medical Management of Chronic Issues    Pt states once Rx wears off she started to feel bad    Breanna Ramirez is a 68 year old woman, former smoker with COPD and GERD who returns to pulmonary clinic for COPD and organizing pneumonia.   She reports on going dyspnea since last visit and felt worse when tapering to 10mg  daily of prednisone  and has gone back up to 20mg  daily. She is not using oxygen  at this time with ambulation. She continues to have lower extremity edema. Echo is scheduled for 10/13. Still has not been scheduled with cardiology. She continues to take lasix  20mg  daily. She remains on all her nebulizer treatments.  Denies fevers, chills or sweats.   OV 09/01/24 Since Sunday night, she experiences worsening dyspnea and increased cough with minimal sputum production. Fevers occurred on Sunday and Monday nights. She resumed oxygen  use during ambulation due to severe dyspnea but does not require it at rest.  Significant bilateral lower extremity edema persists despite 40 mg of Lasix  daily. Her legs remain swollen and painful.  She increased her prednisone  from 10 mg to 20 mg this morning without immediate improvement.  She is scheduled to receive iron  on Monday and is concerned about her kidney and liver function due to her medication regimen.  OV 07/26/24 She reports feeling better since last visit. She has been on 30mg  of prednisone  daily and bactrim  3 days per week. She has significant leg swelling despite taking 40mg  of lasix  daily. CMP showed stable creatinine a week ago.   Simple walk today did not indicate and oxygen  desaturations below 88% with ambulation. She has been using oxygen  at night.   She is hoping to return to work as her disability did not get approved.  OV  07/06/24 Patient was admitted 7/5 for progressive worsening of her breathing. She was started on high dose steroid taper on 7/3 for concern of organizing pneumonia. Patient was seen in hospital by pulmonary team and she did not want bronchoscopy or surgical lung biopsies performed.  She is feeling better since discharge. She is currently on 40mg  prednisone  daily and taking bactrim  1 tab 3 days per week. She thinks she is starting to feel better. She is using 4L of oxygen  now.   Disability paper work completed. Unable to determine if she will be able to return to work. Plan to treat for 2-3 months with steroids and reassess, but I believe she may likely not be able to return to work after this.   OV 06/13/24 She was admitted 5/18 to 5/21 for on going respiratory symptoms. She had bronchoscopy done 5/19 with significant mucous noted. She had trouble expectorating mucous. Anti-muscarinic medications were stopped to help thin secretions.  BAL cultures remain negative to date. Cytology is negative.  She experiences persistent thick mucus production despite recent hospitalization and treatment changes. She continues to have difficulty clearing secretions.   She uses a nebulizer and inhaler, recently switching from [facility] to Trelegy, which she finds more effective. Currently, she uses Ohtuvayre  nebulizers with slight improvement. Chest physical therapy, including a chest vest and high salt solution nebulizer treatment, did not yield significant results. She has hypertonic saline at home but has not been using it.  She started using oxygen  after  recent hospitalizations and is concerned about her oxygen  dependency and physical limitations.  She has difficulty lifting more than 10lbs. 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   OV 05/04/24 She has persistent wheezing and a non-productive cough since  her hospitalization on March 14, 2024. Initially, she experienced pain in the lower right chest, now located in the upper right chest with associated wheezing. Despite treatment with amoxicillin , azithromycin , prednisone , and Augmentin , symptoms have only slightly improved. Ohtuvayre  was prescribed but not yet approved by insurance.  She experiences night sweats, which began before her hospitalization and continue. She denies fever, chills, or difficulty swallowing. No joint pains or muscle aches.  Her current medications include Trelegy, one puff daily, and nebulizer treatments twice a day. She uses a Flutter device regularly and has one remaining prednisone  pill.  She quit smoking in 2019 and occasionally smells smoke from neighbors in her apartment. No current smoking, vaping, or exposure to secondhand smoke within her home. She has no pets and does not use down pillows or comforters.   OV 08/25/22 ONO since last visit showed she qualified for night time oxygen . She is considering formal sleep study when she has more time away from work.  She was treated with levaquin  for 5 days and then augmentin  for 10 days in July by her nurse practitioner at work for on going cough.   She reports her breathing is well controlled at this time but continues to have some wheezing.   She has run out of montelukast . She has significant post nasal drainage. GERD is well controlled at this time.   OV 06/02/22 She was admitted 5/21 to 5/24 for COPD exacerbation. She was discharged with supplemental oxygen  3L. Using at night mainly.   She continues to have cough with some sputum production. She has wheezing but it is much improved. She is not quite back to baseline yet. She is concerned about returning to work where it is very hot and humid in [employer]. She is concerned it will aggravate her breathing again.   OV 05/12/22 She was treated for COPD exacerbation at last visit with prednisone  taper with Zpak. She  was started on singulair . She continues on symbicort  2 puffs twice dialy and as needed duoneb treatments. She reports relief from the prednisone  taper but of recent she has increased cough, sputum production and wheezing.   PFTs show moderate restriction and diffusion defect. Flow volume loop does appear obstructed.   OV 02/12/22 She reports being treated in September 2022 for pneumonia and later developed COVID-19 infection in late October 2022 in which she was treated with Paxlovid.  She again was sick with pneumonia in December 2022 and responded well to antibiotics, steroids and using Symbicort  inhaler.  She tapered herself off the Symbicort  inhaler in January after she was feeling better.  She started to have wheezing towards the end of January along with shortness of breath.  She denies any cough or sputum production.  She reports that she notices the wheezing mostly at nighttime when going to bed.  She quit smoking in 2019.  She has a 40+ pack year smoking history.  She has significant secondhand smoke exposure in childhood.  She currently works as a Solicitor at a [employer]  She denies any significant dust or chemical exposures.  She does have significant reflux and seasonal allergies.  She  reports springtime is the most difficult season for her due to the pollen.  She denies any increased cough or dysphagia when eating or drinking.  She denies the sensation of food getting stuck in her throat or chest.  Her father had asthma and her maternal grandfather had asthma.  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   Past Medical History:  Diagnosis Date   COPD (chronic obstructive pulmonary disease) (HCC)    Essential hypertension    GERD (gastroesophageal reflux disease)    Pneumonia 05/2017   Respiratory failure with hypoxia (HCC) 05/2017     Family History  Problem Relation Age of Onset   Cancer Father      Social History    Socioeconomic History   Marital status: Divorced    Spouse name: Not on file   Number of children: 1   Years of education: Not on file   Highest education level: Not on file  Occupational History   Occupation: Works in Designer, fashion/clothing.  Tobacco Use   Smoking status: Former    Current packs/day: 0.00    Average packs/day: 1 pack/day for 45.0 years (45.0 ttl pk-yrs)    Types: Cigarettes    Start date: 12/29/1972    Quit date: 12/29/2017    Years since quitting: 6.7   Smokeless tobacco: Never  Vaping Use   Vaping status: Never Used  Substance and Sexual Activity   Alcohol use: No   Drug use: No   Sexual activity: Never    Birth control/protection: Post-menopausal  Other Topics Concern   Not on file  Social History Narrative   Not on file   Social Drivers of Health   Financial Resource Strain: Not on file  Food Insecurity: No Food Insecurity (09/05/2024)   Hunger Vital Sign    Worried About Running Out of Food in the Last Year: Never true    Ran Out of Food in the Last Year: Never true  Transportation Needs: No Transportation Needs (09/05/2024)   PRAPARE - Administrator, Civil Service (Medical): No    Lack of Transportation (Non-Medical): No  Physical Activity: Not on file  Stress: Not on file  Social Connections: Unknown (07/03/2024)   Social Connection and Isolation Panel    Frequency of Communication with Friends and Family: Patient declined    Frequency of Social Gatherings with Friends and Family: Not on file    Attends Religious Services: Patient declined    Active Member of Clubs or Organizations: Not on file    Attends Banker Meetings: Patient declined    Marital Status: Not on file  Intimate Partner Violence: Not At Risk (09/05/2024)   Humiliation, Afraid, Rape, and Kick questionnaire    Fear of Current or Ex-Partner: No    Emotionally Abused: No    Physically Abused: No    Sexually Abused: No     Allergies  Allergen Reactions   Codeine  Nausea And Vomiting   Daliresp  [Roflumilast ] Other (See Comments)    Severe nose bleeds     Outpatient Medications Prior to Visit  Medication Sig Dispense Refill   albuterol  (VENTOLIN  HFA) 108 (90 Base) MCG/ACT inhaler Inhale 2 puffs into the lungs every 6 (six) hours as needed for wheezing or shortness of breath. (Patient taking differently: Inhale 2 puffs into the lungs every 4 (four) hours as needed for wheezing or shortness of breath.) 6.7 g 0   arformoterol  (  BROVANA ) 15 MCG/2ML NEBU Take 2 mLs (15 mcg total) by nebulization 2 (two) times daily. 120 mL 6   Aspirin -Salicylamide-Caffeine (BC HEADACHE POWDER PO) Take 1 packet by mouth daily as needed (for headaches or back pain).     budesonide  (PULMICORT ) 0.5 MG/2ML nebulizer solution Take 2 mLs (0.5 mg total) by nebulization 2 (two) times daily. 120 mL 6   cholecalciferol (VITAMIN D3) 25 MCG (1000 UNIT) tablet Take 1,000 Units by mouth every other day.     Ensifentrine  (OHTUVAYRE ) 3 MG/2.5ML SUSP Inhale 1 Inhalation into the lungs 2 (two) times daily. (Patient taking differently: Inhale 3 mg into the lungs 2 (two) times daily.) 150 mL 11   esomeprazole  (NEXIUM ) 40 MG capsule Take 1 capsule (40 mg total) by mouth daily. (Patient taking differently: Take 40 mg by mouth at bedtime.) 90 capsule 4   fluticasone  (FLONASE ) 50 MCG/ACT nasal spray Place 1 spray into both nostrils every evening. (Patient taking differently: Place 1 spray into both nostrils 2 (two) times daily as needed for allergies or rhinitis.) 16 each 11   ibuprofen (ADVIL) 200 MG tablet Take 400 mg by mouth every 6 (six) hours as needed for mild pain (pain score 1-3) or headache.     metoprolol  tartrate (LOPRESSOR ) 25 MG tablet Take 0.5 tablets (12.5 mg total) by mouth 2 (two) times daily. 30 tablet 0   montelukast  (SINGULAIR ) 10 MG tablet Take 1 tablet (10 mg total) by mouth at bedtime. 30 tablet 11   OXYGEN  Inhale 3 L/min into the lungs continuous.     potassium chloride  (KLOR-CON )  10 MEQ tablet Take 10 mEq by mouth daily.     revefenacin  (YUPELRI ) 175 MCG/3ML nebulizer solution Take 3 mLs (175 mcg total) by nebulization daily. 90 mL 6   simvastatin  (ZOCOR ) 40 MG tablet Take 1 tablet (40 mg total) by mouth at bedtime. 90 tablet 3   sodium chloride  HYPERTONIC 3 % nebulizer solution Take by nebulization as needed for other. (Patient taking differently: Take 4 mLs by nebulization as needed (as directed for COPD- related symptoms).) 750 mL 12   albuterol  (PROVENTIL ) (2.5 MG/3ML) 0.083% nebulizer solution Take 3 mLs (2.5 mg total) by nebulization every 2 (two) hours as needed for wheezing. 75 mL 0   amoxicillin -clavulanate (AUGMENTIN ) 875-125 MG tablet Take 1 tablet by mouth 2 (two) times daily. 14 tablet 0   azithromycin  (ZITHROMAX ) 250 MG tablet Take as directed 6 tablet 0   furosemide  (LASIX ) 20 MG tablet Take 1 tablet (20 mg total) by mouth daily. (Patient taking differently: Take 20 mg by mouth daily as needed for fluid or edema.) 30 tablet 11   predniSONE  (DELTASONE ) 10 MG tablet Take 4 tablets (40 mg total) by mouth daily with breakfast. 56 tablet 0   No facility-administered medications prior to visit.   Review of Systems  Constitutional:  Negative for chills, fever, malaise/fatigue and weight loss.  HENT:  Negative for congestion, sinus pain and sore throat.   Eyes: Negative.   Respiratory:  Positive for cough, sputum production, shortness of breath and wheezing. Negative for hemoptysis.   Cardiovascular:  Positive for leg swelling. Negative for chest pain, palpitations, orthopnea and claudication.  Gastrointestinal:  Negative for abdominal pain, heartburn, nausea and vomiting.  Genitourinary: Negative.   Musculoskeletal:  Negative for joint pain and myalgias.  Skin:  Negative for rash.  Neurological:  Negative for weakness and headaches.  Endo/Heme/Allergies: Negative.   Psychiatric/Behavioral: Negative.      Objective:   Vitals:  09/22/24 1051  BP: (!)  123/54  Pulse: 80  SpO2: 94%  Weight: 138 lb (62.6 kg)  Height: 5' 4 (1.626 m)   Physical Exam Constitutional:      General: She is not in acute distress.    Appearance: She is not ill-appearing.  HENT:     Head: Normocephalic and atraumatic.  Eyes:     General: No scleral icterus.    Conjunctiva/sclera: Conjunctivae normal.  Cardiovascular:     Rate and Rhythm: Normal rate and regular rhythm.     Pulses: Normal pulses.     Heart sounds: Normal heart sounds. No murmur heard. Pulmonary:     Effort: Pulmonary effort is normal.     Breath sounds: No wheezing, rhonchi or rales.  Musculoskeletal:     Right lower leg: Edema present.     Left lower leg: Edema present.  Skin:    General: Skin is warm and dry.  Neurological:     Mental Status: She is alert.    CBC    Component Value Date/Time   WBC 8.9 09/01/2024 1535   RBC 3.53 (L) 09/05/2024 1346   RBC 3.85 (L) 09/01/2024 1535   HGB 8.5 Repeated and verified X2. (L) 09/01/2024 1535   HCT 28.3 (L) 09/01/2024 1535   PLT 438.0 (H) 09/01/2024 1535   MCV 73.5 (L) 09/01/2024 1535   MCH 23.6 (L) 07/04/2024 0500   MCHC 30.1 09/01/2024 1535   RDW 21.7 (H) 09/01/2024 1535   LYMPHSABS 0.3 (L) 09/01/2024 1535   MONOABS 0.3 09/01/2024 1535   EOSABS 0.0 09/01/2024 1535   BASOSABS 0.1 09/01/2024 1535      Latest Ref Rng & Units 09/01/2024    3:35 PM 07/19/2024   10:15 AM 07/04/2024    5:00 AM  BMP  Glucose 70 - 99 mg/dL 886  892  877   BUN 6 - 23 mg/dL 14  22  26    Creatinine 0.40 - 1.20 mg/dL 9.07  9.06  9.14   Sodium 135 - 145 mEq/L 139  139  134   Potassium 3.5 - 5.1 mEq/L 3.4  4.1  4.4   Chloride 96 - 112 mEq/L 93  101  97   CO2 19 - 32 mEq/L 32  28  28   Calcium 8.4 - 10.5 mg/dL 8.6  8.8  9.8    Chest imaging: CT Chest 06/24/24 Multifocal consolidation/foci of atelectasis with associated bronchiectasis and bronchiolectasis, mosaic ground-glass attenuation and interlobular septal thickening. Findings can be seen the  setting of multifocal infection/inflammation ( organizing pneumonias), chronic hypersensitivity pneumonitis, fibrotic NSIP, and sarcoidosis. Correlate with clinical findings and follow-up to ensure stability/resolution.   Scattered pulmonary micro nodules are stable to prior. No new nodules. Follow-up with chest CT in 1 year to ensure stability.   Aortic Atherosclerosis (ICD10-I70.0) and Emphysema (ICD10-J43.9).  PFT:    Latest Ref Rng & Units 05/12/2022   10:47 AM  PFT Results  FVC-Pre L 1.67   FVC-Predicted Pre % 51   FVC-Post L 1.72   FVC-Predicted Post % 52   Pre FEV1/FVC % % 77   Post FEV1/FCV % % 79   FEV1-Pre L 1.28   FEV1-Predicted Pre % 51   FEV1-Post L 1.36   DLCO uncorrected ml/min/mmHg 8.38   DLCO UNC% % 41   DLCO corrected ml/min/mmHg 8.38   DLCO COR %Predicted % 41   DLVA Predicted % 56   TLC L 4.06   TLC % Predicted % 78  RV % Predicted % 104     Labs:  Path:  Echo:  Heart Catheterization:   MBS 05/17/24 Clinical Impression: Clinical Impression: Patient presents with a mild oropharyngeal dysphagia with an esophageal component as well. Anterior hyoid excursion was partial in completion and although epiglottic inversion and laryngeal vestibule closure appeared complete, they were delayed, resulting in instances of penetration above the vocal cords which cleared laryngeal vestibule (PAS 2) with thin liquids. PES opening was partial in duration and distention, resulting in diffuse pharyngeal residuals with solids and liquids. Dry swallow helped to clear pharyngeal residuals, though patient did not exhibit sensation to any pharyngeal residuals. 13mm barium tablet transited through pharynx, PES and esophagus without difficulty. During esophageal sweep, barium stasis, questionable narrowing at portion of distal esophagus with retrograde movement that stayed within the esophagus  Assessment & Plan:   Chronic obstructive pulmonary disease, unspecified COPD type  (HCC)  Organizing pneumonia (HCC) - Plan: predniSONE  (DELTASONE ) 10 MG tablet, CT CHEST HIGH RESOLUTION  Chronic diastolic CHF (congestive heart failure) (HCC) - Plan: Basic Metabolic Panel (BMET), furosemide  (LASIX ) 20 MG tablet, Basic Metabolic Panel (BMET)  Chronic hypoxic respiratory failure, on home oxygen  therapy (HCC)  Leg swelling  Discussion: Breanna Ramirez is a 68 year old woman, former smoker with COPD and GERD who returns to pulmonary clinic for COPD and Organizing pneumonia.   Organizing Pneumonia - ESR/CRP low at last visit - She is off oxygen  today and has clear lungs on exam - Steroid taper: 15mg  daily for 10 days, 10mg  daily for 10 days then 5mg  daily for 10 days  Chronic Obstructive Pulmonary Disease (COPD) with chronic bronchitis - continue budesonide  twice daily - continue brovana  twice daily - continue yupelri  daily - continue ohtuvayre  - will discuss with pharmacy about trying to get her on a biologic medication to get off prednisone   Chronic Hypoxemic Respiratory Failure - She is not needing oxygen  during the day and with ambulation currently - continue 2L at night with sleep  Possible Heart Failure - has had elevated BNP labs in past - continue lasix  20mg  daily - cardiology referral pending - check BMP today  Follow up in 2 months  Dorn Chill, MD [facility] Office: [phone]   Current Outpatient Medications:    albuterol  (VENTOLIN  HFA) 108 (90 Base) MCG/ACT inhaler, Inhale 2 puffs into the lungs every 6 (six) hours as needed for wheezing or shortness of breath. (Patient taking differently: Inhale 2 puffs into the lungs every 4 (four) hours as needed for wheezing or shortness of breath.), Disp: 6.7 g, Rfl: 0   arformoterol  (BROVANA ) 15 MCG/2ML NEBU, Take 2 mLs (15 mcg total) by nebulization 2 (two) times daily., Disp: 120 mL, Rfl: 6   Aspirin -Salicylamide-Caffeine (BC HEADACHE POWDER PO), Take 1 packet by mouth daily as  needed (for headaches or back pain)., Disp: , Rfl:    budesonide  (PULMICORT ) 0.5 MG/2ML nebulizer solution, Take 2 mLs (0.5 mg total) by nebulization 2 (two) times daily., Disp: 120 mL, Rfl: 6   cholecalciferol (VITAMIN D3) 25 MCG (1000 UNIT) tablet, Take 1,000 Units by mouth every other day., Disp: , Rfl:    Ensifentrine  (OHTUVAYRE ) 3 MG/2.5ML SUSP, Inhale 1 Inhalation into the lungs 2 (two) times daily. (Patient taking differently: Inhale 3 mg into the lungs 2 (two) times daily.), Disp: 150 mL, Rfl: 11   esomeprazole  (NEXIUM ) 40 MG capsule, Take 1 capsule (40 mg total) by mouth daily. (Patient taking differently: Take 40 mg by mouth  at bedtime.), Disp: 90 capsule, Rfl: 4   fluticasone  (FLONASE ) 50 MCG/ACT nasal spray, Place 1 spray into both nostrils every evening. (Patient taking differently: Place 1 spray into both nostrils 2 (two) times daily as needed for allergies or rhinitis.), Disp: 16 each, Rfl: 11   furosemide  (LASIX ) 20 MG tablet, Take 1 tablet (20 mg total) by mouth daily., Disp: 30 tablet, Rfl: 5   ibuprofen (ADVIL) 200 MG tablet, Take 400 mg by mouth every 6 (six) hours as needed for mild pain (pain score 1-3) or headache., Disp: , Rfl:    metoprolol  tartrate (LOPRESSOR ) 25 MG tablet, Take 0.5 tablets (12.5 mg total) by mouth 2 (two) times daily., Disp: 30 tablet, Rfl: 0   montelukast  (SINGULAIR ) 10 MG tablet, Take 1 tablet (10 mg total) by mouth at bedtime., Disp: 30 tablet, Rfl: 11   OXYGEN , Inhale 3 L/min into the lungs continuous., Disp: , Rfl:    potassium chloride  (KLOR-CON ) 10 MEQ tablet, Take 10 mEq by mouth daily., Disp: , Rfl:    predniSONE  (DELTASONE ) 10 MG tablet, Take 1.5 tablets (15 mg total) by mouth daily with breakfast for 10 days, THEN 1 tablet (10 mg total) daily with breakfast for 10 days, THEN 0.5 tablets (5 mg total) daily with breakfast for 10 days., Disp: 30 tablet, Rfl: 0   revefenacin  (YUPELRI ) 175 MCG/3ML nebulizer solution, Take 3 mLs (175 mcg total) by  nebulization daily., Disp: 90 mL, Rfl: 6   simvastatin  (ZOCOR ) 40 MG tablet, Take 1 tablet (40 mg total) by mouth at bedtime., Disp: 90 tablet, Rfl: 3   sodium chloride  HYPERTONIC 3 % nebulizer solution, Take by nebulization as needed for other. (Patient taking differently: Take 4 mLs by nebulization as needed (as directed for COPD- related symptoms).), Disp: 750 mL, Rfl: 12   albuterol  (PROVENTIL ) (2.5 MG/3ML) 0.083% nebulizer solution, Take 3 mLs (2.5 mg total) by nebulization every 2 (two) hours as needed for wheezing., Disp: 75 mL, Rfl: 0

## 2024-09-22 NOTE — Progress Notes (Signed)
 This encounter was created in error - please disregard.

## 2024-09-23 ENCOUNTER — Other Ambulatory Visit: Payer: Self-pay

## 2024-09-23 ENCOUNTER — Encounter (HOSPITAL_COMMUNITY)

## 2024-09-26 ENCOUNTER — Other Ambulatory Visit: Payer: Self-pay

## 2024-09-26 ENCOUNTER — Other Ambulatory Visit: Payer: Self-pay | Admitting: Nurse Practitioner

## 2024-09-26 DIAGNOSIS — D509 Iron deficiency anemia, unspecified: Secondary | ICD-10-CM

## 2024-09-26 DIAGNOSIS — E538 Deficiency of other specified B group vitamins: Secondary | ICD-10-CM

## 2024-09-29 ENCOUNTER — Ambulatory Visit (HOSPITAL_COMMUNITY)
Admission: RE | Admit: 2024-09-29 | Discharge: 2024-09-29 | Disposition: A | Discharge status: Home / Self Care | Source: Ambulatory Visit | Attending: Pulmonary Disease | Admitting: Pulmonary Disease

## 2024-09-29 DIAGNOSIS — J8489 Other specified interstitial pulmonary diseases: Secondary | ICD-10-CM | POA: Insufficient documentation

## 2024-10-10 ENCOUNTER — Ambulatory Visit (HOSPITAL_COMMUNITY)
Admission: RE | Admit: 2024-10-10 | Discharge: 2024-10-10 | Disposition: A | Discharge status: Home / Self Care | Source: Ambulatory Visit | Attending: Cardiovascular Disease | Admitting: Cardiovascular Disease

## 2024-10-10 DIAGNOSIS — I5032 Chronic diastolic (congestive) heart failure: Secondary | ICD-10-CM | POA: Insufficient documentation

## 2024-10-10 LAB — ECHOCARDIOGRAM COMPLETE
Area-P 1/2: 3.4 cm2
S' Lateral: 2.4 cm

## 2024-10-21 ENCOUNTER — Inpatient Hospital Stay: Attending: Nurse Practitioner

## 2024-10-21 DIAGNOSIS — D509 Iron deficiency anemia, unspecified: Secondary | ICD-10-CM | POA: Insufficient documentation

## 2024-10-21 DIAGNOSIS — E538 Deficiency of other specified B group vitamins: Secondary | ICD-10-CM | POA: Diagnosis not present

## 2024-10-21 LAB — CBC WITH DIFFERENTIAL (CANCER CENTER ONLY)
Abs Immature Granulocytes: 0.06 K/uL (ref 0.00–0.07)
Basophils Absolute: 0.1 K/uL (ref 0.0–0.1)
Basophils Relative: 1 %
Eosinophils Absolute: 0.1 K/uL (ref 0.0–0.5)
Eosinophils Relative: 1 %
HCT: 31.6 % — ABNORMAL LOW (ref 36.0–46.0)
Hemoglobin: 9.6 g/dL — ABNORMAL LOW (ref 12.0–15.0)
Immature Granulocytes: 1 %
Lymphocytes Relative: 7 %
Lymphs Abs: 0.8 K/uL (ref 0.7–4.0)
MCH: 26.4 pg (ref 26.0–34.0)
MCHC: 30.4 g/dL (ref 30.0–36.0)
MCV: 87.1 fL (ref 80.0–100.0)
Monocytes Absolute: 0.7 K/uL (ref 0.1–1.0)
Monocytes Relative: 6 %
Neutro Abs: 9.6 K/uL — ABNORMAL HIGH (ref 1.7–7.7)
Neutrophils Relative %: 84 %
Platelet Count: 331 K/uL (ref 150–400)
RBC: 3.63 MIL/uL — ABNORMAL LOW (ref 3.87–5.11)
RDW: 21.4 % — ABNORMAL HIGH (ref 11.5–15.5)
WBC Count: 11.3 K/uL — ABNORMAL HIGH (ref 4.0–10.5)
nRBC: 0 % (ref 0.0–0.2)

## 2024-10-21 LAB — FERRITIN: Ferritin: 24 ng/mL (ref 11–307)

## 2024-10-21 LAB — IRON AND IRON BINDING CAPACITY (CC-WL,HP ONLY)
Iron: 27 ug/dL — ABNORMAL LOW (ref 28–170)
Saturation Ratios: 7 % — ABNORMAL LOW (ref 10.4–31.8)
TIBC: 413 ug/dL (ref 250–450)
UIBC: 386 ug/dL (ref 148–442)

## 2024-10-21 LAB — VITAMIN B12: Vitamin B-12: 271 pg/mL (ref 180–914)

## 2024-10-27 ENCOUNTER — Other Ambulatory Visit: Payer: Self-pay | Admitting: Nurse Practitioner

## 2024-10-27 ENCOUNTER — Ambulatory Visit: Payer: Self-pay | Admitting: Nurse Practitioner

## 2024-10-27 ENCOUNTER — Telehealth: Payer: Self-pay

## 2024-10-27 NOTE — Telephone Encounter (Signed)
 Lacie, patient will be scheduled as soon as possible.  Auth Submission: NO AUTH NEEDED Site of care: Site of care: [facility] Payer: Medicare A/B with BCBS commercial Medication & CPT/J Code(s) submitted: Feraheme (ferumoxytol ) [id number] Diagnosis Code:  Route of submission (phone, fax, portal):  Phone # Fax # Auth type: Buy/Bill PB Units/visits requested: 510mg  x 2 doses Reference number:  Approval from: 10/27/24 to 12/28/24

## 2024-10-31 ENCOUNTER — Ambulatory Visit: Attending: Cardiology | Admitting: Cardiology

## 2024-10-31 ENCOUNTER — Encounter: Payer: Self-pay | Admitting: Cardiology

## 2024-10-31 ENCOUNTER — Other Ambulatory Visit (HOSPITAL_COMMUNITY): Payer: Self-pay

## 2024-10-31 VITALS — BP 124/60 | HR 91 | Ht 64.0 in | Wt 127.6 lb

## 2024-10-31 DIAGNOSIS — R0609 Other forms of dyspnea: Secondary | ICD-10-CM | POA: Diagnosis not present

## 2024-10-31 DIAGNOSIS — E782 Mixed hyperlipidemia: Secondary | ICD-10-CM

## 2024-10-31 DIAGNOSIS — I5032 Chronic diastolic (congestive) heart failure: Secondary | ICD-10-CM

## 2024-10-31 DIAGNOSIS — F17210 Nicotine dependence, cigarettes, uncomplicated: Secondary | ICD-10-CM | POA: Insufficient documentation

## 2024-10-31 MED ORDER — FUROSEMIDE 40 MG PO TABS
40.0000 mg | ORAL_TABLET | Freq: Every day | ORAL | 3 refills | Status: AC
  Filled 2024-10-31: qty 90, 90d supply, fill #0
  Filled 2025-01-24: qty 90, 90d supply, fill #1

## 2024-10-31 NOTE — Patient Instructions (Addendum)
 Medication Instructions:  INCREASE Lasix  40 mg daily   *If you need a refill on your cardiac medications before your next appointment, please call your pharmacy*  Lab Work: CBC BMP PROBNP LIPID PANEL  If you have labs (blood work) drawn today and your tests are completely normal, you will receive your results only by: MyChart Message (if you have MyChart) OR A paper copy in the mail If you have any lab test that is abnormal or we need to change your treatment, we will call you to review the results.  Testing/Procedures: RIGHT HEART CATH  Your physician has requested that you have a cardiac catheterization. Cardiac catheterization is used to diagnose and/or treat various heart conditions. Doctors may recommend this procedure for a number of different reasons. The most common reason is to evaluate chest pain. Chest pain can be a symptom of coronary artery disease (CAD), and cardiac catheterization can show whether plaque is narrowing or blocking your heart's arteries. This procedure is also used to evaluate the valves, as well as measure the blood flow and oxygen  levels in different parts of your heart. For further information please visit [email or web address] Please follow instruction sheet, as given.   Follow-Up: At [facility], you and your health needs are our priority.  As part of our continuing mission to provide you with exceptional heart care, our providers are all part of one team.  This team includes your primary Cardiologist (physician) and Advanced Practice Providers or APPs (Physician Assistants and Nurse Practitioners) who all work together to provide you with the care you need, when you need it.  Your next appointment:   2 week(s) POST CATH   Provider:   One of our Advanced Practice Providers (APPs) or Dr. Elmira : Morse Clause, PA-C  Lamarr Satterfield, NP Miriam Shams, NP  Olivia Pavy, PA-C Josefa Beauvais, NP  Leontine Salen, PA-C Orren Fabry, PA-C  [place], [place] Jackee Alberts, NP  Damien Braver, NP Jon Hails, PA-C  Waddell Donath, PA-C    Dayna Dunn, PA-C  Glendia Ferrier, PA-C Lum Louis, NP Rosabel Mose, NP Callie Goodrich, PA-C  Xika Zhao, NP Thom Sluder, PA-C    Rollo Louder, PA-C   Other Instructions  [place] [facility] DEPT OF [facility] AT [facility] A DEPT OF [facility] [address] [place] [address] Dept: [phone] Loc: [phone]  Breanna Ramirez  10/31/2024  You are scheduled for a Cardiac Catheterization on Tuesday, November 11 with Dr. Newman Breanna Ramirez.  1. Please arrive at the [facility] (Main Entrance A) at [facility]: [address] [place], [place] [address] at 7:00 AM (This time is 2 hour(s) before your procedure to ensure your preparation).   Free valet parking service is available. You will check in at ADMITTING. The support person will be asked to wait in the waiting room.  It is OK to have someone drop you off and come back when you are ready to be discharged.    Special note: Every effort is made to have your procedure done on time. Please understand that emergencies sometimes delay scheduled procedures.  2. Diet: Nothing to eat after midnight.   3. Hydration: You need to be well hydrated before your procedure. On November 11, you may drink approved liquids (see below) until 2 hours before the procedure, with 16 oz of water as your last intake.   List of approved liquids water, clear juice,  clear tea, black coffee, fruit juices, non-citric and without pulp, carbonated beverages, Gatorade, Kool -Aid, plain Jello-O and plain ice popsicles.  4. Labs: today  5. Medication instructions in preparation for your procedure:   Contrast Allergy: No   Stop taking, Advil or Motrin (Ibuprofen) Monday, November 10,, Lasix  (Furosemide )  Monday, November 10,, BC Powder Monday, November 10   On the morning of your procedure, take your  Aspirin  81 mg and any morning medicines NOT listed above.  You may use sips of water.  6. Plan to go home the same day, you will only stay overnight if medically necessary. 7. Bring a current list of your medications and current insurance cards. 8. You MUST have a responsible person to drive you home. 9. Someone MUST be with you the first 24 hours after you arrive home or your discharge will be delayed. 10. Please wear clothes that are easy to get on and off and wear slip-on shoes.  Thank you for allowing us  to care for you!   -- [place] [facility]

## 2024-10-31 NOTE — Progress Notes (Signed)
 Cardiology Office Note:  .   Date:  10/31/2024  ID:  Breanna Ramirez, Breanna Ramirez August 26, 1956, MRN [id number] PCP: Austin Mutton, MD  [facility] Providers Cardiologist:  Newman Lawrence, MD PCP: Austin Mutton, MD  Chief Complaint  Patient presents with   Exertional dyspnea     Breanna Ramirez is a 68 y.o. female with COPD, nicotine dependence, diastolic dysfunction, exertional dyspnea  Discussed the use of AI scribe software for clinical note transcription with the patient, who gave verbal consent to proceed.  History of Present Illness Breanna Ramirez is a 68 year old female with COPD who presents with worsening shortness of breath.  Her shortness of breath has worsened after discontinuing prednisone , which she had been taking for a prolonged period. While on prednisone , her breathing improved despite significant leg swelling at higher doses. She completed her prednisone  taper last week, and her shortness of breath has since worsened.  She has been using oxygen  at night for COPD and today is the first time she has used it during the day due to increased shortness of breath upon waking. She experiences a productive cough at times but denies chest pain or tightness.  She smokes five to ten cigarettes a day and is attempting to quit by gradually reducing her intake. She is currently taking Lasix  20 mg once daily and simvastatin . Her leg swelling has improved since stopping prednisone .      Vitals:   10/31/24 1008  BP: 124/60  Pulse: 91  SpO2: 94%      Review of Systems  Cardiovascular:  Positive for dyspnea on exertion. Negative for chest pain, leg swelling, palpitations and syncope.        Studies Reviewed: SABRA        EKG 10/31/2024: Normal sinus rhythm Possible Left atrial enlargement When compared with ECG of 02-Jul-2024 10:26,  rate is slightly slower    Echocardiogram 09/2024:  1. Left ventricular ejection fraction, by estimation, is 60 to 65%. The  left ventricle  has normal function. The left ventricle has no regional  wall motion abnormalities. Left ventricular diastolic parameters are  consistent with Grade II diastolic dysfunction (pseudonormalization).  Elevated left ventricular end-diastolic pressure.   2. Right ventricular systolic function is normal. The right ventricular  size is normal.   3. The mitral valve is abnormal. Mild mitral valve regurgitation. No  evidence of mitral stenosis.   4. The aortic valve is tricuspid. There is mild calcification of the  aortic valve. There is mild thickening of the aortic valve. Aortic valve  regurgitation is not visualized. Aortic valve sclerosis is present, with  no evidence of aortic valve stenosis.   5. The inferior vena cava is dilated in size with >50% respiratory  variability, suggesting right atrial pressure of 8 mmHg.   CT chest 09/2024: 1. No evidence of interstitial lung disease. Scattered linear pulmonary parenchymal scarring and volume loss. 2. Air trapping is indicative of small airways disease. 3. Aortic atherosclerosis (ICD10-I70.0). Coronary artery calcification. 4. Enlarged pulmonic trunk, indicative of pulmonary arterial hypertension. 5. Emphysema (ICD10-J43.9). Low-dose CT lung cancer screening is recommended for patients who are 61-45 years of age with a 20+ pack-year history of smoking and who are currently smoking or quit <=15 years ago.     Labs Independently interpreted 08/2024: Hb 9.6 Cr 0.78, eGFR 77 TSH 0.4  2015: Chol 201, TG 79, HDL 44, LDL 141     Physical Exam Vitals and nursing note reviewed.  Constitutional:  General: She is not in acute distress. Neck:     Vascular: No JVD.  Cardiovascular:     Rate and Rhythm: Normal rate and regular rhythm.     Heart sounds: Normal heart sounds. No murmur heard. Pulmonary:     Effort: Pulmonary effort is normal.     Breath sounds: Decreased air movement present. No wheezing or rales.  Musculoskeletal:      Right lower leg: No edema.     Left lower leg: No edema.      VISIT DIAGNOSES:   ICD-10-CM   1. Chronic diastolic CHF (congestive heart failure) (HCC)  I50.32 EKG 12-Lead    furosemide  (LASIX ) 40 MG tablet    2. Mixed hyperlipidemia  E78.2 Lipid panel    3. DOE (dyspnea on exertion)  R06.09 EKG 12-Lead    CBC    Basic metabolic panel with GFR    Pro b natriuretic peptide (BNP)    4. Cigarette nicotine dependence without complication  F17.210        Breanna Ramirez is a 68 y.o. female with COPD, nicotine dependence, diastolic dysfunction, exertional dyspnea  Assessment & Plan Exertional dyspnea: Multiple risk factors for CAD, including nicotine dependence, hypertension, hyperlipidemia, as well as intestinal dysfunction.  However, symptoms of exertional dyspnea are particularly worse after stopping prednisone . Mild there could still be competent of diastolic heart failure, I suspect pulmonary etiology to be more likely. Other possibilities pulmonary hypertension given her longstanding COPD. Her longstanding anemia is also likely contributing to exertional dyspnea. Recommend increasing Lasix  to 40 mg daily. Check proBNP. Proceed with right heart catheterization to further classify etiology for her dyspnea. Check CBC, BMP.  Mixed hyperlipidemia: Last checked in 2015. On low-dose simvastatin . Smoking and hyperlipidemia are coronary artery disease risk factors. Check lipid panel.  Nicotine dependence: Tobacco cessation counseling: - Currently smoking 5-10 packs/day   - Patient was informed of the dangers of tobacco abuse including stroke, cancer, and MI, as well as benefits of tobacco cessation. - Patient is willing to quit at this time. - Approximately 5 mins were spent counseling patient cessation techniques. We discussed various methods to help quit smoking, including deciding on a date to quit, joining a support group, pharmacological agents.  Patient is trying to wean  herself off cigarettes.   - I will reassess her progress at the next follow-up visit     Meds ordered this encounter  Medications   furosemide  (LASIX ) 40 MG tablet    Sig: Take 1 tablet (40 mg total) by mouth daily.    Dispense:  90 tablet    Refill:  3     F/u after right heart cath  Signed, Newman JINNY Lawrence, MD

## 2024-11-01 LAB — BASIC METABOLIC PANEL WITH GFR
BUN/Creatinine Ratio: 11 — ABNORMAL LOW (ref 12–28)
BUN: 10 mg/dL (ref 8–27)
CO2: 20 mmol/L (ref 20–29)
Calcium: 9.6 mg/dL (ref 8.7–10.3)
Chloride: 102 mmol/L (ref 96–106)
Creatinine, Ser: 0.95 mg/dL (ref 0.57–1.00)
Glucose: 97 mg/dL (ref 70–99)
Potassium: 5.1 mmol/L (ref 3.5–5.2)
Sodium: 137 mmol/L (ref 134–144)
eGFR: 65 mL/min/1.73 (ref >59)

## 2024-11-01 LAB — LIPID PANEL
Chol/HDL Ratio: 3.1 ratio (ref 0.0–4.4)
Cholesterol, Total: 136 mg/dL (ref 100–199)
HDL: 44 mg/dL (ref >39)
LDL Chol Calc (NIH): 79 mg/dL (ref 0–99)
Triglycerides: 61 mg/dL (ref 0–149)
VLDL Cholesterol Cal: 13 mg/dL (ref 5–40)

## 2024-11-01 LAB — CBC
Hematocrit: 28 % — ABNORMAL LOW (ref 34.0–46.6)
Hemoglobin: 8.2 g/dL — ABNORMAL LOW (ref 11.1–15.9)
MCH: 26.4 pg — ABNORMAL LOW (ref 26.6–33.0)
MCHC: 29.3 g/dL — ABNORMAL LOW (ref 31.5–35.7)
MCV: 90 fL (ref 79–97)
Platelets: 405 x10E3/uL (ref 150–450)
RBC: 3.11 x10E6/uL — ABNORMAL LOW (ref 3.77–5.28)
RDW: 19.1 % — ABNORMAL HIGH (ref 11.7–15.4)
WBC: 13 x10E3/uL — ABNORMAL HIGH (ref 3.4–10.8)

## 2024-11-01 LAB — PRO B NATRIURETIC PEPTIDE: NT-Pro BNP: 285 pg/mL (ref 0–301)

## 2024-11-02 ENCOUNTER — Ambulatory Visit (INDEPENDENT_AMBULATORY_CARE_PROVIDER_SITE_OTHER)

## 2024-11-02 VITALS — BP 114/54 | HR 80 | Temp 98.0°F | Resp 12 | Ht 64.5 in | Wt 124.0 lb

## 2024-11-02 DIAGNOSIS — D509 Iron deficiency anemia, unspecified: Secondary | ICD-10-CM

## 2024-11-02 MED ORDER — SODIUM CHLORIDE 0.9 % IV SOLN
510.0000 mg | Freq: Once | INTRAVENOUS | Status: AC
  Administered 2024-11-02: 510 mg via INTRAVENOUS
  Filled 2024-11-02: qty 17

## 2024-11-02 NOTE — Progress Notes (Signed)
 Diagnosis: Iron  Deficiency Anemia  Provider:  Mannam, Praveen MD  Procedure: IV Infusion  IV Type: Peripheral, IV Location: L Antecubital  Feraheme (Ferumoxytol ), Dose: 510 mg  Infusion Start Time: 1538  Infusion Stop Time: 1554  Post Infusion IV Care: Patient declined observation and Peripheral IV Discontinued  Discharge: Condition: Good, Destination: Home . AVS Provided  Performed by:  Rocky FORBES Sar, RN

## 2024-11-07 ENCOUNTER — Telehealth: Payer: Self-pay | Admitting: *Deleted

## 2024-11-07 NOTE — Telephone Encounter (Signed)
 10/31/24 Hgb 8.2-Per Dr Elmira 11/01/24 Staff Message-hemoglobin chronically low, okay to proceed.

## 2024-11-07 NOTE — Telephone Encounter (Addendum)
 Right Heart Cath scheduled at [facility] for: Tuesday November 08, 2024 9 AM Arrival time [facility] Main Entrance A at: 7 AM  Diet: -Nothing to eat after midnight.  Hydration: -May drink clear liquids until 2 hours before the procedure.  Approved liquids: Water, clear tea, black coffee, fruit juices-non-citric and without pulp,Gatorade, plain Jello/popsicles  Medication instructions: -Hold:  Lasix /KCl-AM of procedure -Other usual morning medications can be taken.  Plan to go home the same day, you will only stay overnight if medically necessary.  You must have responsible adult to drive you home.  Someone must be with you the first 24 hours after you arrive home.  Left message for patient to call back to review procedure instructions.  Left detailed message with procedure instructions [phone] (DPR).

## 2024-11-08 ENCOUNTER — Ambulatory Visit (HOSPITAL_COMMUNITY)
Admission: RE | Admit: 2024-11-08 | Discharge: 2024-11-08 | Disposition: A | Discharge status: Home / Self Care | Attending: Cardiology | Admitting: Cardiology

## 2024-11-08 ENCOUNTER — Other Ambulatory Visit: Payer: Self-pay

## 2024-11-08 ENCOUNTER — Encounter (HOSPITAL_COMMUNITY)
Admission: RE | Disposition: A | Discharge status: Home / Self Care | Payer: Self-pay | Source: Home / Self Care | Attending: Cardiology

## 2024-11-08 DIAGNOSIS — D649 Anemia, unspecified: Secondary | ICD-10-CM | POA: Diagnosis not present

## 2024-11-08 DIAGNOSIS — F17210 Nicotine dependence, cigarettes, uncomplicated: Secondary | ICD-10-CM | POA: Diagnosis not present

## 2024-11-08 DIAGNOSIS — J449 Chronic obstructive pulmonary disease, unspecified: Secondary | ICD-10-CM | POA: Diagnosis not present

## 2024-11-08 DIAGNOSIS — E782 Mixed hyperlipidemia: Secondary | ICD-10-CM | POA: Diagnosis not present

## 2024-11-08 DIAGNOSIS — I5032 Chronic diastolic (congestive) heart failure: Secondary | ICD-10-CM | POA: Diagnosis not present

## 2024-11-08 DIAGNOSIS — Z79899 Other long term (current) drug therapy: Secondary | ICD-10-CM | POA: Diagnosis not present

## 2024-11-08 DIAGNOSIS — I272 Pulmonary hypertension, unspecified: Secondary | ICD-10-CM | POA: Diagnosis not present

## 2024-11-08 DIAGNOSIS — R0609 Other forms of dyspnea: Secondary | ICD-10-CM | POA: Diagnosis present

## 2024-11-08 HISTORY — PX: RIGHT HEART CATH: CATH118263

## 2024-11-08 LAB — POCT I-STAT EG7
Acid-Base Excess: 0 mmol/L (ref 0.0–2.0)
Acid-Base Excess: 1 mmol/L (ref 0.0–2.0)
Bicarbonate: 24.6 mmol/L (ref 20.0–28.0)
Bicarbonate: 25.8 mmol/L (ref 20.0–28.0)
Calcium, Ion: 1.29 mmol/L (ref 1.15–1.40)
Calcium, Ion: 1.33 mmol/L (ref 1.15–1.40)
HCT: 29 % — ABNORMAL LOW (ref 36.0–46.0)
HCT: 29 % — ABNORMAL LOW (ref 36.0–46.0)
Hemoglobin: 9.9 g/dL — ABNORMAL LOW (ref 12.0–15.0)
Hemoglobin: 9.9 g/dL — ABNORMAL LOW (ref 12.0–15.0)
O2 Saturation: 59 %
O2 Saturation: 61 %
Potassium: 4 mmol/L (ref 3.5–5.1)
Potassium: 4.1 mmol/L (ref 3.5–5.1)
Sodium: 139 mmol/L (ref 135–145)
Sodium: 139 mmol/L (ref 135–145)
TCO2: 26 mmol/L (ref 22–32)
TCO2: 27 mmol/L (ref 22–32)
pCO2, Ven: 39.4 mmHg — ABNORMAL LOW (ref 44–60)
pCO2, Ven: 40 mmHg — ABNORMAL LOW (ref 44–60)
pH, Ven: 7.404 (ref 7.25–7.43)
pH, Ven: 7.417 (ref 7.25–7.43)
pO2, Ven: 31 mmHg — CL (ref 32–45)
pO2, Ven: 31 mmHg — CL (ref 32–45)

## 2024-11-08 SURGERY — RIGHT HEART CATH

## 2024-11-08 MED ORDER — SODIUM CHLORIDE 0.9 % IV SOLN: 250.0000 mL | INTRAVENOUS | Status: DC | PRN

## 2024-11-08 MED ORDER — SODIUM CHLORIDE 0.9% FLUSH: 3.0000 mL | INTRAVENOUS | Status: DC | PRN

## 2024-11-08 MED ORDER — HEPARIN (PORCINE) IN NACL 1000-0.9 UT/500ML-% IV SOLN
INTRAVENOUS | Status: DC | PRN
  Administered 2024-11-08 (×2): 500 mL

## 2024-11-08 MED ORDER — FREE WATER: 500.0000 mL | Freq: Once | Status: DC

## 2024-11-08 MED ORDER — ACETAMINOPHEN 325 MG PO TABS: 650.0000 mg | ORAL_TABLET | ORAL | Status: DC | PRN

## 2024-11-08 MED ORDER — LIDOCAINE HCL (PF) 1 % IJ SOLN
INTRAMUSCULAR | Status: DC | PRN
  Administered 2024-11-08: 5 mL via INTRADERMAL

## 2024-11-08 MED ORDER — LIDOCAINE HCL (PF) 1 % IJ SOLN
INTRAMUSCULAR | Status: AC
  Filled 2024-11-08: qty 30

## 2024-11-08 MED ORDER — SODIUM CHLORIDE 0.9% FLUSH: 3.0000 mL | Freq: Two times a day (BID) | INTRAVENOUS | Status: DC

## 2024-11-08 MED ORDER — ONDANSETRON HCL 4 MG/2ML IJ SOLN: 4.0000 mg | Freq: Four times a day (QID) | INTRAMUSCULAR | Status: DC | PRN

## 2024-11-08 MED ORDER — LABETALOL HCL 5 MG/ML IV SOLN
10.0000 mg | INTRAVENOUS | Status: DC | PRN
Start: 2024-11-08 — End: 2024-11-08

## 2024-11-08 MED ORDER — HYDRALAZINE HCL 20 MG/ML IJ SOLN: 10.0000 mg | INTRAMUSCULAR | Status: DC | PRN

## 2024-11-08 SURGICAL SUPPLY — 5 items
CATH BALLN WEDGE 5F 110CM (CATHETERS) IMPLANT
PACK CARDIAC CATHETERIZATION (CUSTOM PROCEDURE TRAY) IMPLANT
SHEATH GLIDE SLENDER 4/5FR (SHEATH) IMPLANT
TRANSDUCER W/STOPCOCK (MISCELLANEOUS) IMPLANT
TUBING ART PRESS 72 MALE/FEM (TUBING) IMPLANT

## 2024-11-08 NOTE — Progress Notes (Signed)
 Discharge instructions reviewed with patient and sister via phone. Denies questions concerns. Incision site remains clean dry and intact. No s/s of complications. PT escorted from the unit via wheel chair to personal vehicle.

## 2024-11-08 NOTE — Discharge Instructions (Signed)

## 2024-11-08 NOTE — Interval H&P Note (Signed)
 History and Physical Interval Note:  11/08/2024 8:45 AM  Breanna Ramirez  has presented today for surgery, with the diagnosis of dysnea.  The various methods of treatment have been discussed with the patient and family. After consideration of risks, benefits and other options for treatment, the patient has consented to  Procedure(s): RIGHT HEART CATH (N/A) as a surgical intervention.  The patient's history has been reviewed, patient examined, no change in status, stable for surgery.  I have reviewed the patient's chart and labs.  Questions were answered to the patient's satisfaction.     Jamyiah Labella J Lemoyne Scarpati

## 2024-11-08 NOTE — H&P (Signed)
 Cardiology Office Note:  .   Date:  11/08/2024  ID:  Breanna Ramirez, Breanna Ramirez 10-16-1956, MRN [id number] PCP: Austin Mutton, MD  [facility] Providers Cardiologist:  Newman Lawrence, MD PCP: Austin Mutton, MD  C/C: Shortness of breath   Breanna Ramirez is a 68 y.o. female with COPD, nicotine dependence, diastolic dysfunction, exertional dyspnea  Discussed the use of AI scribe software for clinical note transcription with the patient, who gave verbal consent to proceed.  History of Present Illness Breanna Ramirez is a 68 year old female with COPD who presents with worsening shortness of breath.  Her shortness of breath has worsened after discontinuing prednisone , which she had been taking for a prolonged period. While on prednisone , her breathing improved despite significant leg swelling at higher doses. She completed her prednisone  taper last week, and her shortness of breath has since worsened.  She has been using oxygen  at night for COPD and today is the first time she has used it during the day due to increased shortness of breath upon waking. She experiences a productive cough at times but denies chest pain or tightness.  She smokes five to ten cigarettes a day and is attempting to quit by gradually reducing her intake. She is currently taking Lasix  20 mg once daily and simvastatin . Her leg swelling has improved since stopping prednisone .      Vitals:   11/08/24 0740 11/08/24 0743  BP: 128/65 (!) 126/93  Pulse: 65 92  Resp: 16 17  Temp: 98.5 F (36.9 C) 98.2 F (36.8 C)  SpO2: 98% 96%      Review of Systems  Cardiovascular:  Positive for dyspnea on exertion. Negative for chest pain, leg swelling, palpitations and syncope.        Studies Reviewed: Breanna Ramirez        EKG 10/31/2024: Normal sinus rhythm Possible Left atrial enlargement When compared with ECG of 02-Jul-2024 10:26,  rate is slightly slower    Echocardiogram 09/2024:  1. Left ventricular ejection  fraction, by estimation, is 60 to 65%. The  left ventricle has normal function. The left ventricle has no regional  wall motion abnormalities. Left ventricular diastolic parameters are  consistent with Grade II diastolic dysfunction (pseudonormalization).  Elevated left ventricular end-diastolic pressure.   2. Right ventricular systolic function is normal. The right ventricular  size is normal.   3. The mitral valve is abnormal. Mild mitral valve regurgitation. No  evidence of mitral stenosis.   4. The aortic valve is tricuspid. There is mild calcification of the  aortic valve. There is mild thickening of the aortic valve. Aortic valve  regurgitation is not visualized. Aortic valve sclerosis is present, with  no evidence of aortic valve stenosis.   5. The inferior vena cava is dilated in size with >50% respiratory  variability, suggesting right atrial pressure of 8 mmHg.   CT chest 09/2024: 1. No evidence of interstitial lung disease. Scattered linear pulmonary parenchymal scarring and volume loss. 2. Air trapping is indicative of small airways disease. 3. Aortic atherosclerosis (ICD10-I70.0). Coronary artery calcification. 4. Enlarged pulmonic trunk, indicative of pulmonary arterial hypertension. 5. Emphysema (ICD10-J43.9). Low-dose CT lung cancer screening is recommended for patients who are 67-16 years of age with a 20+ pack-year history of smoking and who are currently smoking or quit <=15 years ago.     Labs Independently interpreted 08/2024: Hb 9.6 Cr 0.78, eGFR 77 TSH 0.4  2015: Chol 201, TG 79, HDL 44, LDL 141  Physical Exam Vitals and nursing note reviewed.  Constitutional:      General: She is not in acute distress. Neck:     Vascular: No JVD.  Cardiovascular:     Rate and Rhythm: Normal rate and regular rhythm.     Heart sounds: Normal heart sounds. No murmur heard. Pulmonary:     Effort: Pulmonary effort is normal.     Breath sounds: Decreased air  movement present. No wheezing or rales.  Musculoskeletal:     Right lower leg: No edema.     Left lower leg: No edema.      VISIT DIAGNOSES: No diagnosis found.    Breanna Ramirez is a 68 y.o. female with COPD, nicotine dependence, diastolic dysfunction, exertional dyspnea  Assessment & Plan Exertional dyspnea: Multiple risk factors for CAD, including nicotine dependence, hypertension, hyperlipidemia, as well as intestinal dysfunction.  However, symptoms of exertional dyspnea are particularly worse after stopping prednisone . Mild there could still be competent of diastolic heart failure, I suspect pulmonary etiology to be more likely. Other possibilities pulmonary hypertension given her longstanding COPD. Her longstanding anemia is also likely contributing to exertional dyspnea. Recommend increasing Lasix  to 40 mg daily. Check proBNP. Proceed with right heart catheterization to further classify etiology for her dyspnea. Check CBC, BMP.  Mixed hyperlipidemia: Last checked in 2015. On low-dose simvastatin . Smoking and hyperlipidemia are coronary artery disease risk factors. Check lipid panel.  Nicotine dependence: Tobacco cessation counseling: - Currently smoking 5-10 packs/day   - Patient was informed of the dangers of tobacco abuse including stroke, cancer, and MI, as well as benefits of tobacco cessation. - Patient is willing to quit at this time. - Approximately 5 mins were spent counseling patient cessation techniques. We discussed various methods to help quit smoking, including deciding on a date to quit, joining a support group, pharmacological agents.  Patient is trying to wean herself off cigarettes.   - I will reassess her progress at the next follow-up visit     Meds ordered this encounter  Medications   sodium chloride  flush (NS) 0.9 % injection 3 mL   sodium chloride  flush (NS) 0.9 % injection 3 mL   0.9 %  sodium chloride  infusion     F/u after right heart  cath  Signed, Newman JINNY Lawrence, MD

## 2024-11-09 ENCOUNTER — Encounter (HOSPITAL_COMMUNITY): Payer: Self-pay | Admitting: Cardiology

## 2024-11-09 ENCOUNTER — Ambulatory Visit (INDEPENDENT_AMBULATORY_CARE_PROVIDER_SITE_OTHER)

## 2024-11-09 VITALS — BP 121/68 | HR 78 | Temp 98.3°F | Resp 18 | Ht 64.5 in | Wt 124.4 lb

## 2024-11-09 DIAGNOSIS — D509 Iron deficiency anemia, unspecified: Secondary | ICD-10-CM | POA: Diagnosis not present

## 2024-11-09 MED ORDER — SODIUM CHLORIDE 0.9 % IV SOLN
510.0000 mg | Freq: Once | INTRAVENOUS | Status: AC
  Administered 2024-11-09: 510 mg via INTRAVENOUS
  Filled 2024-11-09: qty 17

## 2024-11-09 NOTE — Progress Notes (Signed)
 Diagnosis: Iron  Deficiency Anemia  Provider:  Lonna Coder MD  Procedure: IV Infusion  IV Type: Peripheral, IV Location: L Antecubital  Feraheme (Ferumoxytol ), Dose: 510 mg  Infusion Start Time: 1538  Infusion Stop Time: 1559  Post Infusion IV Care: Patient declined observation  Discharge: Condition: Good, Destination: Home . AVS Declined  Performed by:  Maximiano JONELLE Pouch, LPN

## 2024-11-22 ENCOUNTER — Encounter: Payer: Self-pay | Admitting: Pulmonary Disease

## 2024-11-22 ENCOUNTER — Ambulatory Visit: Admitting: Pulmonary Disease

## 2024-11-22 VITALS — BP 117/66 | HR 75 | Ht 64.5 in | Wt 126.0 lb

## 2024-11-22 DIAGNOSIS — Z92241 Personal history of systemic steroid therapy: Secondary | ICD-10-CM | POA: Diagnosis not present

## 2024-11-22 DIAGNOSIS — J9611 Chronic respiratory failure with hypoxia: Secondary | ICD-10-CM | POA: Diagnosis not present

## 2024-11-22 DIAGNOSIS — J449 Chronic obstructive pulmonary disease, unspecified: Secondary | ICD-10-CM | POA: Diagnosis not present

## 2024-11-22 DIAGNOSIS — Z9981 Dependence on supplemental oxygen: Secondary | ICD-10-CM

## 2024-11-22 DIAGNOSIS — I5032 Chronic diastolic (congestive) heart failure: Secondary | ICD-10-CM

## 2024-11-22 DIAGNOSIS — J8489 Other specified interstitial pulmonary diseases: Secondary | ICD-10-CM

## 2024-11-22 NOTE — Assessment & Plan Note (Addendum)
 SABRA

## 2024-11-22 NOTE — Progress Notes (Signed)
 Established Patient Pulmonology Office Visit   Subjective:  Patient ID: Breanna Ramirez, female    DOB: 11/25/1956  MRN: [id number]  CC:  Chief Complaint  Patient presents with   Medical Management of Chronic Issues    Pt states well but still got that cough     Discussed the use of AI scribe software for clinical note transcription with the patient, who gave verbal consent to proceed.  History of Present Illness Breanna Ramirez is a 68 year old female with COPD, chronic hypoxic respiratory failure, mild pulmonary hypertension, and diastolic heart failure who presents for follow-up.  She has a persistent predominantly dry cough with occasional scant mucus that disrupts sleep and is her main current symptom.  Her dyspnea has improved slightly since increasing Lasix  to 40 mg with reduced edema. She still has day-to-day variability in breathing. She uses budesonide , Brovana  nebulizers twice daily and yupelri  daily. She continues on Ohtuvayre  nebulizer treatments twice daily  She uses supplemental oxygen  at night. She is using oxygen  during the day as needed based on checking her SpO2.  Recent heart catheterization showed pre and post capillary pulmonary hypertension.   She has been off prednisone  for over a month and has not had return of significant cough, wheezing or dyspnea.  She has anemia that is contributing to her dyspnea. She received four IV iron  infusions and her blood counts have improved since the summer.        ROS    Current Outpatient Medications:    albuterol  (PROVENTIL ) (2.5 MG/3ML) 0.083% nebulizer solution, Take 3 mLs (2.5 mg total) by nebulization every 2 (two) hours as needed for wheezing., Disp: 75 mL, Rfl: 0   albuterol  (VENTOLIN  HFA) 108 (90 Base) MCG/ACT inhaler, Inhale 2 puffs into the lungs every 6 (six) hours as needed for wheezing or shortness of breath., Disp: 6.7 g, Rfl: 0   arformoterol  (BROVANA ) 15 MCG/2ML NEBU, Take 2 mLs (15 mcg total) by  nebulization 2 (two) times daily., Disp: 120 mL, Rfl: 6   Aspirin -Salicylamide-Caffeine (BC HEADACHE POWDER PO), Take 1 packet by mouth daily as needed (for headaches or back pain)., Disp: , Rfl:    budesonide  (PULMICORT ) 0.5 MG/2ML nebulizer solution, Take 2 mLs (0.5 mg total) by nebulization 2 (two) times daily., Disp: 120 mL, Rfl: 6   cholecalciferol (VITAMIN D3) 25 MCG (1000 UNIT) tablet, Take 1,000 Units by mouth every other day., Disp: , Rfl:    Ensifentrine  (OHTUVAYRE ) 3 MG/2.5ML SUSP, Inhale 1 Inhalation into the lungs 2 (two) times daily., Disp: 150 mL, Rfl: 11   esomeprazole  (NEXIUM ) 40 MG capsule, Take 1 capsule (40 mg total) by mouth daily., Disp: 90 capsule, Rfl: 4   fluticasone  (FLONASE ) 50 MCG/ACT nasal spray, Place 1 spray into both nostrils every evening. (Patient taking differently: Place 1 spray into both nostrils at bedtime.), Disp: 16 each, Rfl: 11   Fluticasone -Umeclidin-Vilant (TRELEGY ELLIPTA ) 200-62.5-25 MCG/ACT AEPB, Inhale 1 puff into the lungs daily., Disp: , Rfl:    furosemide  (LASIX ) 40 MG tablet, Take 1 tablet (40 mg total) by mouth daily., Disp: 90 tablet, Rfl: 3   ibuprofen (ADVIL) 200 MG tablet, Take 400 mg by mouth every 6 (six) hours as needed for mild pain (pain score 1-3) or headache., Disp: , Rfl:    metoprolol  tartrate (LOPRESSOR ) 25 MG tablet, Take 0.5 tablets (12.5 mg total) by mouth 2 (two) times daily., Disp: 30 tablet, Rfl: 0   montelukast  (SINGULAIR ) 10 MG tablet, Take 1 tablet (  10 mg total) by mouth at bedtime., Disp: 30 tablet, Rfl: 11   OXYGEN , Inhale 3 L/min into the lungs at bedtime., Disp: , Rfl:    potassium chloride  (KLOR-CON ) 10 MEQ tablet, Take 10 mEq by mouth daily., Disp: , Rfl:    revefenacin  (YUPELRI ) 175 MCG/3ML nebulizer solution, Take 3 mLs (175 mcg total) by nebulization daily., Disp: 90 mL, Rfl: 6   simvastatin  (ZOCOR ) 40 MG tablet, Take 1 tablet (40 mg total) by mouth at bedtime., Disp: 90 tablet, Rfl: 3   sodium chloride  HYPERTONIC 3  % nebulizer solution, Take by nebulization as needed for other. (Patient taking differently: Take 4 mLs by nebulization as needed (as directed for COPD- related symptoms).), Disp: 750 mL, Rfl: 12      Objective:  BP 117/66   Pulse 75   Ht 5' 4.5 (1.638 m) Comment: per pt  Wt 126 lb (57.2 kg)   SpO2 95%   BMI 21.29 kg/m     Physical Exam Constitutional:      General: She is not in acute distress.    Appearance: Normal appearance.  Eyes:     General: No scleral icterus.    Conjunctiva/sclera: Conjunctivae normal.  Cardiovascular:     Rate and Rhythm: Normal rate and regular rhythm.  Pulmonary:     Breath sounds: No wheezing, rhonchi or rales.  Musculoskeletal:     Right lower leg: Edema present.     Left lower leg: Edema present.  Skin:    General: Skin is warm and dry.  Neurological:     General: No focal deficit present.      Diagnostic Review:  Last CBC Lab Results  Component Value Date   WBC 13.0 (H) 10/31/2024   HGB 9.9 (L) 11/08/2024   HGB 9.9 (L) 11/08/2024   HCT 29.0 (L) 11/08/2024   HCT 29.0 (L) 11/08/2024   MCV 90 10/31/2024   MCH 26.4 (L) 10/31/2024   RDW 19.1 (H) 10/31/2024   PLT 405 10/31/2024   Last metabolic panel Lab Results  Component Value Date   GLUCOSE 97 10/31/2024   NA 139 11/08/2024   NA 139 11/08/2024   K 4.0 11/08/2024   K 4.1 11/08/2024   CL 102 10/31/2024   CO2 20 10/31/2024   BUN 10 10/31/2024   CREATININE 0.95 10/31/2024   EGFR 65 10/31/2024   CALCIUM 9.6 10/31/2024   PHOS 3.2 05/15/2024   PROT 6.3 09/01/2024   ALBUMIN 3.9 09/01/2024   LABGLOB 2.4 09/13/2014   AGRATIO 1.8 09/13/2014   BILITOT 0.2 09/01/2024   ALKPHOS 53 09/01/2024   AST 13 09/01/2024   ALT 10 09/01/2024   ANIONGAP 9 07/04/2024       Assessment & Plan:   Assessment & Plan History of steroid therapy  Orders:   DG Bone Density; Future  Chronic obstructive pulmonary disease, unspecified COPD type (HCC)     Chronic diastolic CHF  (congestive heart failure) (HCC)     Chronic hypoxic respiratory failure, on home oxygen  therapy (HCC)     Organizing pneumonia (HCC)      Assessment and Plan Assessment & Plan Chronic obstructive pulmonary disease (COPD) with chronic hypoxic respiratory failure and chronic cough COPD with chronic hypoxic respiratory failure and persistent dry cough. Oxygen  saturation improves post-treatment. Cough is the most prominent symptom now that dyspnea has improved. Concerns about insurance coverage for current medications, including Brovana  and oxygen  therapy. - Continue nebulizer treatments with budesonide , Brovana , yupelri  and Ohtuvayre  - Monitor oxygen   saturation during the day.   Pre and Post Capillary Pulmonary hypertension - continue diuretics per cardiology - continue supplemental oxygen  at night  Chronic diastolic heart failure - Managed with increased Lasix  dosage. Reports improvement in swelling and dyspnea since dosage adjustment.  Anemia Contributing to falls. Receiving IV iron  therapy with improvement in blood counts. - Continue IV iron  therapy.  History of systemic steroid therapy with need for bone density monitoring History of systemic steroid therapy necessitating bone density monitoring. Previous D[facility] scans conducted at the [facility]. - Ordered DEXA bone density scan.      Return in about 3 months (around 02/22/2025) for f/u visit Dr. Kara.   Dorn KATHEE Kara, MD

## 2024-11-22 NOTE — Patient Instructions (Addendum)
 Continue lasix  per cardiology team  Continue budesonide  nebulizer twice daily Continue brovana  nebulizer twice daily Continue yupelri  nebulizer daily Continue Ohtuvayre  nebulizer twice daily  Continue oxygen  at night  Follow up in 3 months, call sooner if needed

## 2024-11-22 NOTE — Assessment & Plan Note (Addendum)
 Breanna Ramirez

## 2024-11-23 ENCOUNTER — Ambulatory Visit: Attending: Cardiology | Admitting: Cardiology

## 2024-11-23 ENCOUNTER — Encounter: Payer: Self-pay | Admitting: Cardiology

## 2024-11-23 VITALS — BP 124/58 | HR 66 | Ht 64.5 in | Wt 126.0 lb

## 2024-11-23 DIAGNOSIS — I5032 Chronic diastolic (congestive) heart failure: Secondary | ICD-10-CM | POA: Diagnosis not present

## 2024-11-23 DIAGNOSIS — E782 Mixed hyperlipidemia: Secondary | ICD-10-CM | POA: Diagnosis not present

## 2024-11-23 DIAGNOSIS — R0609 Other forms of dyspnea: Secondary | ICD-10-CM | POA: Diagnosis not present

## 2024-11-23 MED ORDER — EMPAGLIFLOZIN 10 MG PO TABS: 10.0000 mg | ORAL_TABLET | Freq: Every day | ORAL | 3 refills | Status: AC

## 2024-11-23 NOTE — Progress Notes (Signed)
 Cardiology Office Note:  .   Date:  11/23/2024  ID:  Breanna, Ramirez 1956-07-19, MRN [id number] PCP: Austin Mutton, MD  [facility] Providers Cardiologist:  Newman JINNY Lawrence, MD {  History of Present Illness: .   Breanna Ramirez is a 68 y.o. female with history of COPD, nicotine dependence,  Patient is new to cardiology and seen for the first time 10/2024 for complaints of shortness of breath and followed by pulmonology with known diagnosis of COPD.  She had reported worsening shortness of breath after discontinuing prednisone  and has been on this chronically.  Reported leg swelling.  proBNP 285.  Echocardiogram with preserved biventricular function.  Lasix  increased from 20 mg to 40 mg.  We proceeded with right heart catheterization as we suspected more pulmonary etiologies. 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  Normal PCWP, diastolic heart failure felt to be less likely.  She followed up with pulmonology and had office visit yesterday.  She had reported persistent dry cough that was worse than her DOE, dyspnea had improved.  Today patient presents for follow-up.  She feels that her shortness of breath has improved and happy with current regiment.  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  She will take a break and go on intermittent walks.  She has peripheral edema bilaterally but reports it is worse on the left side.  Denied any orthopnea or any chest pain.  The increase in the Lasix  has helped but still has some residual edema.  ROS: Denies: Chest pain, orthopnea, palpitations, decreased exercise intolerance, fatigue, lightheadedness.   Studies Reviewed: SABRA    EKG Interpretation Date/Time:  Wednesday November 23 2024 14:28:47 EST Ventricular Rate:  66 PR Interval:  130 QRS Duration:  70 QT Interval:  360 QTC Calculation: 377 R Axis:   54  Text  Interpretation: Normal sinus rhythm Normal ECG When compared with ECG of 31-Oct-2024 10:12, QT has shortened Confirmed by Darryle Currier [phone]) on 11/23/2024 2:37:16 PM    Risk Assessment/Calculations:             Physical Exam:   VS:  BP (!) 124/58   Pulse 66   Ht 5' 4.5 (1.638 m)   Wt 126 lb (57.2 kg)   SpO2 97%   BMI 21.29 kg/m    Wt Readings from Last 3 Encounters:  11/23/24 126 lb (57.2 kg)  11/22/24 126 lb (57.2 kg)  11/09/24 124 lb 6.4 oz (56.4 kg)    GEN: Well nourished, well developed in no acute distress NECK: Positive JVD; No carotid bruits CARDIAC: RRR, no murmurs, rubs, gallops RESPIRATORY: wheezing  ABDOMEN: Soft, non-tender, non-distended EXTREMITIES: 1+ pitting edema worse on left side ; No deformity   ASSESSMENT AND PLAN: .    Chronic HFpEF  Presentation seems multifactorial.  Primary driver likely due to the COPD and with wheezing on exam.  Echocardiogram with preserved biventricular function, G2 DD.  Elevated LVEDP.  Mild MR. 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  RA 8, wedge pressure 14 (borderline).  mPAP 25.  With normal PCWP, diastolic heart failure has been the primary driver for symptoms felt to be less likely.  However, on exam mildly volume up with JVD and peripheral edema. Still having some signs of volume despite up titration of Lasix  from 20 to 40 mg.  Start Jardiance  10 mg.  Repeat BMP 2 weeks.  She has no history of yeast infections or UTIs.  Discussed monitoring of the symptoms. Continue with Lasix  40 mg daily. Repeat echocardiogram 3 to 5 years for mitral regurgitation.  COPD/nicotine dependence Follows closely with pulmonology and chronically has required steroids.  She is working on smoking cessation. Slightly wheezy on today's exam. She has been on Lopressor  12.5 mg twice daily.  This is a low-dose with no clear indications for this.  Lopressor  may have some influence with her  asthma/COPD.  May benefit from bisoprolol instead versus discontinuation altogether.  Hyperlipidemia Continue rosuvastatin 40 mg.  LDL 79.  Anemia She is getting IV iron  infusions.  May be contributing.    Dispo: 66-month follow-up to assess how she is doing on Jardiance   Signed, Thom LITTIE Sluder, PA-C

## 2024-11-23 NOTE — Patient Instructions (Signed)
 Medication Instructions:  Start Jardiance  10 mg take one tablet daily *If you need a refill on your cardiac medications before your next appointment, please call your pharmacy*  Lab Work: 2 weeks- BMET If you have labs (blood work) drawn today and your tests are completely normal, you will receive your results only by: MyChart Message (if you have MyChart) OR A paper copy in the mail If you have any lab test that is abnormal or we need to change your treatment, we will call you to review the results.   Follow-Up: At [facility], you and your health needs are our priority.  As part of our continuing mission to provide you with exceptional heart care, our providers are all part of one team.  This team includes your primary Cardiologist (physician) and Advanced Practice Providers or APPs (Physician Assistants and Nurse Practitioners) who all work together to provide you with the care you need, when you need it.  Your next appointment:   2 month(s)  Provider:   Newman JINNY Lawrence, MD or Thom Sluder, PA-C

## 2024-11-30 ENCOUNTER — Telehealth: Payer: Self-pay | Admitting: Pulmonary Disease

## 2024-11-30 NOTE — Telephone Encounter (Signed)
 LVM 11/29/24 and 11/30/24 for patient to call and discuss scheduling the Bone Density study ordered by Dr. Kara

## 2024-12-02 ENCOUNTER — Telehealth: Payer: Self-pay | Admitting: Pulmonary Disease

## 2024-12-02 ENCOUNTER — Encounter: Payer: Self-pay | Admitting: Pulmonary Disease

## 2024-12-02 NOTE — Telephone Encounter (Signed)
 Called and spoke with patient regarding the 03/21/25 8:30 am bone density study appointment at [facility]---arrival time is 8:15 am, ground floor radiology for check in---will mail information to patient and she voiced her understanding

## 2024-12-02 NOTE — Telephone Encounter (Signed)
 Patient is returning call she just received to schedule her bone density appointment . No one answered at the clinic . Please give patient a  call back to get scheduled . Phones disconnected tried calling patient back no response . Please reach out to patient  [phone]

## 2024-12-02 NOTE — Telephone Encounter (Signed)
 Spoke with patient Breanna Ramirez. Sent message to scheduler they will reach out again

## 2024-12-02 NOTE — Telephone Encounter (Signed)
 Copied from CRM [id number]. Topic: Clinical - Request for Lab/Test Order >> Dec 02, 2024 10:46 AM Breanna Ramirez wrote: Reason for CRM: Pt returning phone call regarding scheduling bone density test, contacted [facility] [place] no answer. Please f/u with pt regarding scheduling appt.  Routing to [facility]

## 2024-12-07 ENCOUNTER — Encounter: Payer: Self-pay | Admitting: Pulmonary Disease

## 2024-12-07 ENCOUNTER — Ambulatory Visit: Payer: Self-pay | Admitting: Pulmonary Disease

## 2024-12-07 ENCOUNTER — Ambulatory Visit: Admitting: Pulmonary Disease

## 2024-12-07 VITALS — BP 108/48 | HR 91 | Temp 98.9°F | Ht 64.5 in | Wt 118.4 lb

## 2024-12-07 DIAGNOSIS — J441 Chronic obstructive pulmonary disease with (acute) exacerbation: Secondary | ICD-10-CM

## 2024-12-07 DIAGNOSIS — G4734 Idiopathic sleep related nonobstructive alveolar hypoventilation: Secondary | ICD-10-CM

## 2024-12-07 DIAGNOSIS — R0902 Hypoxemia: Secondary | ICD-10-CM | POA: Diagnosis not present

## 2024-12-07 LAB — CBC WITH DIFFERENTIAL/PLATELET
Basophils Absolute: 0.1 K/uL (ref 0.0–0.1)
Basophils Relative: 0.8 % (ref 0.0–3.0)
Eosinophils Absolute: 0.1 K/uL (ref 0.0–0.7)
Eosinophils Relative: 1.2 % (ref 0.0–5.0)
HCT: 36.6 % (ref 36.0–46.0)
Hemoglobin: 11.8 g/dL — ABNORMAL LOW (ref 12.0–15.0)
Lymphocytes Relative: 7.5 % — ABNORMAL LOW (ref 12.0–46.0)
Lymphs Abs: 0.9 K/uL (ref 0.7–4.0)
MCHC: 32.1 g/dL (ref 30.0–36.0)
MCV: 89.3 fl (ref 78.0–100.0)
Monocytes Absolute: 1.1 K/uL — ABNORMAL HIGH (ref 0.1–1.0)
Monocytes Relative: 9.1 % (ref 3.0–12.0)
Neutro Abs: 9.5 K/uL — ABNORMAL HIGH (ref 1.4–7.7)
Neutrophils Relative %: 81.4 % — ABNORMAL HIGH (ref 43.0–77.0)
Platelets: 326 K/uL (ref 150.0–400.0)
RBC: 4.11 Mil/uL (ref 3.87–5.11)
RDW: 19.3 % — ABNORMAL HIGH (ref 11.5–15.5)
WBC: 11.7 K/uL — ABNORMAL HIGH (ref 4.0–10.5)

## 2024-12-07 MED ORDER — PREDNISONE 10 MG PO TABS: ORAL_TABLET | ORAL | 0 refills | Status: AC

## 2024-12-07 MED ORDER — AZITHROMYCIN 500 MG PO TABS: 500.0000 mg | ORAL_TABLET | ORAL | 1 refills | Status: AC

## 2024-12-07 NOTE — Assessment & Plan Note (Signed)
°  Orders:   CBC with Differential/Platelet; Future   IgE; Future   predniSONE  (DELTASONE ) 10 MG tablet; Take 4 tablets (40 mg total) by mouth daily with breakfast for 3 days, THEN 3 tablets (30 mg total) daily with breakfast for 3 days, THEN 2 tablets (20 mg total) daily with breakfast for 3 days, THEN 1 tablet (10 mg total) daily with breakfast for 3 days.   azithromycin  (ZITHROMAX ) 500 MG tablet; Take 1 tablet (500 mg total) by mouth every Monday, Wednesday, and Friday.

## 2024-12-07 NOTE — Telephone Encounter (Signed)
 Noted

## 2024-12-07 NOTE — Patient Instructions (Addendum)
 Start prednisone  taper  Start Azithromycin  1 tab for 3 days, then take it every Mon, Wed, Friday  We will work on getting you qualified for [employer] or Dupixent based on your lab work today  Continue lasix  per cardiology team  Continue budesonide  nebulizer twice daily Continue brovana  nebulizer twice daily Continue yupelri  nebulizer daily Continue Ohtuvayre  nebulizer twice daily  Continue oxygen  at night  Follow up in February as scheduled

## 2024-12-07 NOTE — Telephone Encounter (Signed)
 error

## 2024-12-07 NOTE — Telephone Encounter (Signed)
 Answer Assessment - Initial Assessment Questions E2C2 Pulmonary Triage - Initial Assessment Questions Chief Complaint (e.g., cough, sob, wheezing, fever, chills, sweat or additional symptoms) *Go to specific symptom protocol after initial questions. SOB, cough with clear mucous  How long have symptoms been present? Friday.  Have you tested for COVID or Flu? Note: If not, ask patient if a home test can be taken. If so, instruct patient to call back for positive results. Yes- negative   MEDICINES:   Have you used any OTC meds to help with symptoms? Yes If yes, ask What medications? Mucinex   Have you used your inhalers/maintenance medication? Yes If yes, What medications? Albuterol  nebulizer bid; albuterol  inhaler 2 puffs tid; brovana  bid; pulmicort  bid; trelegy daily; Yupelri  daily; Ohtuvayre  bid    OXYGEN : Do you wear supplemental oxygen ? Yes If yes, How many liters are you supposed to use? 3L  Do you monitor your oxygen  levels? Yes If yes, What is your reading (oxygen  level) today? 92-93%  What is your usual oxygen  saturation reading?  (Note: Pulmonary O2 sats should be 90% or greater) 96-97%        Reason for Disposition  [1] MILD difficulty breathing (e.g., minimal/no SOB at rest, SOB with walking, pulse < 100) AND [2] NEW-onset or WORSE than normal  Answer Assessment - Initial Assessment Questions E2C2 Pulmonary Triage - Initial Assessment Questions Chief Complaint (e.g., cough, sob, wheezing, fever, chills, sweat or additional symptoms) *Go to specific symptom protocol after initial questions. SOB, cough with clear mucous  How long have symptoms been present? Friday.  Have you tested for COVID or Flu? Note: If not, ask patient if a home test can be taken. If so, instruct patient to call back for positive results. Yes- negative   MEDICINES:   Have you used any OTC meds to help with symptoms? Yes If yes, ask What  medications? Mucinex   Have you used your inhalers/maintenance medication? Yes If yes, What medications? Albuterol  nebulizer bid; albuterol  inhaler 2 puffs tid; brovana  bid; pulmicort  bid; trelegy daily; Yupelri  daily; Ohtuvayre  bid    OXYGEN : Do you wear supplemental oxygen ? Yes If yes, How many liters are you supposed to use? 3L  Do you monitor your oxygen  levels? Yes If yes, What is your reading (oxygen  level) today? 92-93%  What is your usual oxygen  saturation reading?  (Note: Pulmonary O2 sats should be 90% or greater) 96-97%      1. RESPIRATORY STATUS: Describe your breathing? (e.g., wheezing, shortness of breath, unable to speak, severe coughing)      SOB, cough with clear mucous, wheezing all the time  2. ONSET: When did this breathing problem begin?      Friday.  3. PATTERN Does the difficult breathing come and go, or has it been constant since it started?      Comes and goes, with activity.  4. SEVERITY: How bad is your breathing? (e.g., mild, moderate, severe)      Mild.  5. RECURRENT SYMPTOM: Have you had difficulty breathing before? If Yes, ask: When was the last time? and What happened that time?      Yes, they usually tell her COPD exacerbation. Receives prednisone  and antibiotic. Last episode was August or September.  6. CARDIAC HISTORY: Do you have any history of heart disease? (e.g., heart attack, angina, bypass surgery, angioplasty)      CHF but she states cardiologist told her she doesn't have that.  7. LUNG HISTORY: Do you have any history of lung disease?  (  e.g., pulmonary embolus, asthma, emphysema)     COPD.  8. CAUSE: What do you think is causing the breathing problem?      Unsure.  9. OTHER SYMPTOMS: Do you have any other symptoms? (e.g., chest pain, cough, dizziness, fever, runny nose)     Nasal congestion and drainage with yellow to green mucous, cough with clear mucous. No chest pain or fever.  10.  O2 SATURATION MONITOR:  Do you use an oxygen  saturation monitor (pulse oximeter) at home? If Yes, ask: What is your reading (oxygen  level) today? What is your usual oxygen  saturation reading? (e.g., 95%)       92-93%  11. PREGNANCY: Is there any chance you are pregnant? When was your last menstrual period?       N/A.  12. TRAVEL: Have you traveled out of the country in the last month? (e.g., travel history, exposures)       No.  Protocols used: Breathing Difficulty-A-AH

## 2024-12-07 NOTE — Telephone Encounter (Signed)
 This RN attempted x1 to contact patient, no answer, left voicemail message. Will place in Call Back folder.

## 2024-12-07 NOTE — Telephone Encounter (Signed)
 Copied from CRM #[id number]. Topic: Clinical - Red Word Triage >> Dec 07, 2024  8:01 AM Ismael A wrote: Red Word that prompted transfer to Nurse Triage: pt is wheezing, states she is congested and a little bit SOB

## 2024-12-07 NOTE — Progress Notes (Unsigned)
 "  Established Patient Pulmonology Office Visit   Subjective:  Patient ID: Breanna Ramirez, female    DOB: 30-Aug-1956  MRN: [id number]  CC:  Chief Complaint  Patient presents with   COPD    Head/chest congestion since 12/6.  Foamy mucus from chest and yellow/green from nose.  No fever, chills or body aches.   Eye pain from sinuses.  A little more sob than normal.   chronic respiratory failure    Discussed the use of AI scribe software for clinical note transcription with the patient, who gave verbal consent to proceed.  History of Present Illness Sonia Bromell is a 68 year old female with asthma-COPD, chronic hypoxic respiratory failure, mild pulmonary hypertension, and diastolic heart failure who presents for follow-up.   She reports worsening shortness of breath since Friday, with further progression by Tuesday that led her to see her primary care doctor. She is dissatisfied with that visit and does not plan to return there, which may affect follow up for her lung disease.  Her asthma and COPD have been treated with intermittent steroid tapers in the past, though she has not used steroids since the last course. She currently uses Ohtuvayre  and feels it improves her breathing, but is worried about ongoing insurance coverage of this medication.  She lives in the same home and denies known water  damage or other new environmental issues.        ROS    Current Outpatient Medications:    albuterol  (PROVENTIL ) (2.5 MG/3ML) 0.083% nebulizer solution, Take 3 mLs (2.5 mg total) by nebulization every 2 (two) hours as needed for wheezing., Disp: 75 mL, Rfl: 0   albuterol  (VENTOLIN  HFA) 108 (90 Base) MCG/ACT inhaler, Inhale 2 puffs into the lungs every 6 (six) hours as needed for wheezing or shortness of breath., Disp: 6.7 g, Rfl: 0   arformoterol  (BROVANA ) 15 MCG/2ML NEBU, Take 2 mLs (15 mcg total) by nebulization 2 (two) times daily., Disp: 120 mL, Rfl: 6   Aspirin -Salicylamide-Caffeine  (BC HEADACHE POWDER PO), Take 1 packet by mouth daily as needed (for headaches or back pain)., Disp: , Rfl:    azithromycin  (ZITHROMAX ) 500 MG tablet, Take 1 tablet (500 mg total) by mouth every Monday, Wednesday, and Friday., Disp: 15 tablet, Rfl: 1   budesonide  (PULMICORT ) 0.5 MG/2ML nebulizer solution, Take 2 mLs (0.5 mg total) by nebulization 2 (two) times daily., Disp: 120 mL, Rfl: 6   cholecalciferol (VITAMIN D3) 25 MCG (1000 UNIT) tablet, Take 1,000 Units by mouth every other day., Disp: , Rfl:    empagliflozin  (JARDIANCE ) 10 MG TABS tablet, Take 1 tablet (10 mg total) by mouth daily before breakfast., Disp: 90 tablet, Rfl: 3   Ensifentrine  (OHTUVAYRE ) 3 MG/2.5ML SUSP, Inhale 1 Inhalation into the lungs 2 (two) times daily., Disp: 150 mL, Rfl: 11   esomeprazole  (NEXIUM ) 40 MG capsule, Take 1 capsule (40 mg total) by mouth daily., Disp: 90 capsule, Rfl: 4   fluticasone  (FLONASE ) 50 MCG/ACT nasal spray, Place 1 spray into both nostrils every evening. (Patient taking differently: Place 1 spray into both nostrils at bedtime.), Disp: 16 each, Rfl: 11   Fluticasone -Umeclidin-Vilant (TRELEGY ELLIPTA ) 200-62.5-25 MCG/ACT AEPB, Inhale 1 puff into the lungs daily., Disp: , Rfl:    furosemide  (LASIX ) 40 MG tablet, Take 1 tablet (40 mg total) by mouth daily., Disp: 90 tablet, Rfl: 3   ibuprofen (ADVIL) 200 MG tablet, Take 400 mg by mouth every 6 (six) hours as needed for mild pain (pain  score 1-3) or headache., Disp: , Rfl:    metoprolol  tartrate (LOPRESSOR ) 25 MG tablet, Take 0.5 tablets (12.5 mg total) by mouth 2 (two) times daily., Disp: 30 tablet, Rfl: 0   montelukast  (SINGULAIR ) 10 MG tablet, Take 1 tablet (10 mg total) by mouth at bedtime., Disp: 30 tablet, Rfl: 11   OXYGEN , Inhale 3 L/min into the lungs at bedtime., Disp: , Rfl:    potassium chloride  (KLOR-CON ) 10 MEQ tablet, Take 10 mEq by mouth daily., Disp: , Rfl:    predniSONE  (DELTASONE ) 10 MG tablet, Take 4 tablets (40 mg total) by mouth daily  with breakfast for 3 days, THEN 3 tablets (30 mg total) daily with breakfast for 3 days, THEN 2 tablets (20 mg total) daily with breakfast for 3 days, THEN 1 tablet (10 mg total) daily with breakfast for 3 days., Disp: 30 tablet, Rfl: 0   revefenacin  (YUPELRI ) 175 MCG/3ML nebulizer solution, Take 3 mLs (175 mcg total) by nebulization daily., Disp: 90 mL, Rfl: 6   simvastatin  (ZOCOR ) 40 MG tablet, Take 1 tablet (40 mg total) by mouth at bedtime., Disp: 90 tablet, Rfl: 3   sodium chloride  HYPERTONIC 3 % nebulizer solution, Take by nebulization as needed for other. (Patient taking differently: Take 4 mLs by nebulization as needed (as directed for COPD- related symptoms).), Disp: 750 mL, Rfl: 12      Objective:  BP (!) 108/48 (BP Location: Left Arm, Patient Position: Sitting, Cuff Size: Normal)   Pulse 91   Temp 98.9 F (37.2 C) (Oral)   Ht 5' 4.5 (1.638 m)   Wt 118 lb 6.4 oz (53.7 kg)   SpO2 91% Comment: RA  BMI 20.01 kg/m     Physical Exam Constitutional:      General: She is not in acute distress.    Appearance: Normal appearance.  Eyes:     General: No scleral icterus.    Conjunctiva/sclera: Conjunctivae normal.  Cardiovascular:     Rate and Rhythm: Normal rate and regular rhythm.  Pulmonary:     Breath sounds: Wheezing present. No rhonchi or rales.  Musculoskeletal:     Right lower leg: No edema.     Left lower leg: No edema.  Skin:    General: Skin is warm and dry.  Neurological:     General: No focal deficit present.      Diagnostic Review:  Last CBC Lab Results  Component Value Date   WBC 11.7 (H) 12/07/2024   HGB 11.8 (L) 12/07/2024   HCT 36.6 12/07/2024   MCV 89.3 12/07/2024   MCH 26.4 (L) 10/31/2024   RDW 19.3 (H) 12/07/2024   PLT 326.0 12/07/2024   Last metabolic panel Lab Results  Component Value Date   GLUCOSE 97 10/31/2024   NA 139 11/08/2024   NA 139 11/08/2024   K 4.0 11/08/2024   K 4.1 11/08/2024   CL 102 10/31/2024   CO2 20 10/31/2024    BUN 10 10/31/2024   CREATININE 0.95 10/31/2024   EGFR 65 10/31/2024   CALCIUM 9.6 10/31/2024   PHOS 3.2 05/15/2024   PROT 6.3 09/01/2024   ALBUMIN 3.9 09/01/2024   LABGLOB 2.4 09/13/2014   AGRATIO 1.8 09/13/2014   BILITOT 0.2 09/01/2024   ALKPHOS 53 09/01/2024   AST 13 09/01/2024   ALT 10 09/01/2024   ANIONGAP 9 07/04/2024       Assessment & Plan:   Assessment & Plan Asthma-COPD overlap syndrome (HCC)  Orders:   Ambulatory referral to Pharmacotherapy Clinic  COPD with acute exacerbation (HCC)  Orders:   CBC with Differential/Platelet; Future   IgE; Future   predniSONE  (DELTASONE ) 10 MG tablet; Take 4 tablets (40 mg total) by mouth daily with breakfast for 3 days, THEN 3 tablets (30 mg total) daily with breakfast for 3 days, THEN 2 tablets (20 mg total) daily with breakfast for 3 days, THEN 1 tablet (10 mg total) daily with breakfast for 3 days.   azithromycin  (ZITHROMAX ) 500 MG tablet; Take 1 tablet (500 mg total) by mouth every Monday, Wednesday, and Friday.  Nocturnal hypoxemia      Assessment and Plan Assessment & Plan Asthma-COPD Overlap with exacerbation Exacerbation with increased dyspnea and congestion. Suspected steroid-dependent asthma or COPD. Discussed biologic medications as steroid alternatives. - Eosinophils and IgE not very elevated - Initiate Teszpire injections, will consult pharmacy - Prescribed azithromycin  500 mg daily for three days, then Monday, Wednesday, Friday regimen for long-term therapy. - Prescribed 12-day steroid taper. - Scheduled follow-up on February 25th. - Continue budesonide  nebulizer twice daily - Continue brovana  nebulizer twice daily - Continue yupelri  nebulizer daily - Continue Ohtuvayre  nebulizer twice daily      Return for f/u visit Dr. Kara.   Dorn KATHEE Kara, MD "

## 2024-12-08 LAB — IGE: IgE (Immunoglobulin E), Serum: 37 kU/L (ref <=114)

## 2024-12-16 ENCOUNTER — Encounter: Payer: Self-pay | Admitting: Pulmonary Disease

## 2024-12-19 ENCOUNTER — Telehealth: Payer: Self-pay

## 2024-12-19 ENCOUNTER — Telehealth: Payer: Self-pay | Admitting: Pulmonary Disease

## 2024-12-19 NOTE — Telephone Encounter (Signed)
 Copied from CRM #[id number]. Topic: General - Other >> Dec 19, 2024  1:22 PM Corean SAUNDERS wrote: Reason for CRM: Patient attempted to call back Burnard but CAL unable to connect the call. Please call patient regarding signing paperwork. >> Dec 19, 2024  3:53 PM Leila BROCKS wrote: Patient [phone] is returning [facility] phone call regarding paperwork for Dupixent. Per CAL, patient needs come into the office to sign paper for Tezspire and no appointment is needed. Patient states will come into the office around 3:30 pm.    Routing to [employer]

## 2024-12-19 NOTE — Telephone Encounter (Signed)
 Tried to reach out to patient  VM/ LM okay per DPR I have some paperwork I need her to sign so we can start her on the Rx provider discuss LOV

## 2024-12-19 NOTE — Telephone Encounter (Signed)
 See original phone note. Breanna Ramirez has tried contacting the pt already.

## 2024-12-19 NOTE — Telephone Encounter (Signed)
-----   Message from Dorn Chill, MD sent at 12/16/2024  3:39 PM EST ----- Regarding: Tezspire start Hi Burnard,  Let's get paperwork together for her to start Tezspire. I have placed pharmacy referral.  Thanks, JD

## 2024-12-19 NOTE — Telephone Encounter (Signed)
 Copied from CRM #[id number]. Topic: General - Other >> Dec 19, 2024  1:22 PM Corean SAUNDERS wrote: Reason for CRM: Patient attempted to call back Burnard but CAL unable to connect the call. Please call patient regarding signing paperwork.

## 2024-12-19 NOTE — Telephone Encounter (Signed)
 See phone note 12/19/24

## 2024-12-20 NOTE — Telephone Encounter (Signed)
 X3   Tried to reach out to patient (if she can make it in to sign the paperwork )

## 2024-12-20 NOTE — Telephone Encounter (Signed)
 Tezspire paperwork handed into pharmacy

## 2024-12-23 ENCOUNTER — Other Ambulatory Visit (HOSPITAL_BASED_OUTPATIENT_CLINIC_OR_DEPARTMENT_OTHER): Payer: Self-pay | Admitting: Internal Medicine

## 2024-12-23 DIAGNOSIS — Z1231 Encounter for screening mammogram for malignant neoplasm of breast: Secondary | ICD-10-CM

## 2024-12-27 ENCOUNTER — Telehealth: Payer: Self-pay

## 2024-12-27 NOTE — Telephone Encounter (Signed)
 Received Tezspire new start paperwork. Submitted a Prior Authorization request to [facility] for TEZSPIRE via CoverMyMeds. Will update once we receive a response.  Key: [id number]

## 2024-12-28 LAB — BASIC METABOLIC PANEL WITH GFR
BUN/Creatinine Ratio: 22 (ref 12–28)
BUN: 18 mg/dL (ref 8–27)
CO2: 21 mmol/L (ref 20–29)
Calcium: 9.8 mg/dL (ref 8.7–10.3)
Chloride: 101 mmol/L (ref 96–106)
Creatinine, Ser: 0.81 mg/dL (ref 0.57–1.00)
Glucose: 69 mg/dL — ABNORMAL LOW (ref 70–99)
Potassium: 4.2 mmol/L (ref 3.5–5.2)
Sodium: 138 mmol/L (ref 134–144)
eGFR: 79 mL/min/1.73

## 2024-12-30 ENCOUNTER — Ambulatory Visit: Payer: Self-pay | Admitting: Cardiology

## 2025-01-02 ENCOUNTER — Other Ambulatory Visit (HOSPITAL_COMMUNITY): Payer: Self-pay

## 2025-01-02 NOTE — Telephone Encounter (Signed)
 Pt has new insurance under [employer]. Test claim is currently going through under transition fill with $1600 copay. Submitted a Prior Authorization request to [employer] for TEZSPIRE via CoverMyMeds. Will update once we receive a response.  Key: [id number]

## 2025-01-12 ENCOUNTER — Telehealth: Payer: Self-pay

## 2025-01-12 ENCOUNTER — Other Ambulatory Visit (HOSPITAL_COMMUNITY): Payer: Self-pay

## 2025-01-12 NOTE — Telephone Encounter (Signed)
 Received notification from [employer] regarding a prior authorization for TEZSPIRE. Authorization has been APPROVED from 01/02/25 to 07/02/25. Approval letter sent to scan center.  Per test claim, copay for 28 days supply is $1667.60  Patient can fill through [facility]: [phone]   Authorization # [id number] Phone # [phone]   Pt enrolled in Asthma grant through [employer]:  Amount: $1500 Award Period: 10/13/24 - 01/10/26 BIN: [id number] PCN: [id number] Group: [id number] ID: [id number]  Pt remaining copay for first month will be $167.60. Sent message to pt to see if she's ok with copay or would like to enroll in M3P. Will await pt's response.

## 2025-01-12 NOTE — Telephone Encounter (Unsigned)
 Copied from CRM #[id number]. Topic: Clinical - Medication Question >> Jan 12, 2025  9:38 AM Devaughn RAMAN wrote: Reason for CRM: Nikola-pharmacy tech with [facility] called in regards to getting a prescription sent over for [facility].   [facility] Fax-[phone] Phone-[phone]

## 2025-01-17 ENCOUNTER — Telehealth: Payer: Self-pay

## 2025-01-17 NOTE — Telephone Encounter (Signed)
 Copied from CRM [id number]. Topic: Clinical - Medication Refill >> Jan 17, 2025  2:08 PM LaVerne A wrote: Medication: TEZSPIRE 210 per 1.91 ml  Has the patient contacted their pharmacy? Yes (Agent: If no, request that the patient contact the pharmacy for the refill. If patient does not wish to contact the pharmacy document the reason why and proceed with request.) (Agent: If yes, when and what did the pharmacy advise?)  This is the patient's preferred pharmacy:  [facility] - [place], [place] - [address] [place] [address] Phone: [phone] Fax: [phone] Hours: Not open 24 hours   Is this the correct pharmacy for this prescription? Yes If no, delete pharmacy and type the correct one.   Has the prescription been filled recently? No  Is the patient out of the medication? Yes  Has the patient been seen for an appointment in the last year OR does the patient have an upcoming appointment? Yes  Can we respond through MyChart? Yes  Agent: Please be advised that Rx refills may take up to 3 business days. We ask that you follow-up with your pharmacy. >> Jan 17, 2025  2:16 PM LaVerne A wrote: Zach from [facility] called in the prescription for the patient.   Specialty med- routing to pharmacy

## 2025-01-20 NOTE — Telephone Encounter (Signed)
 This has not been initiated

## 2025-01-24 ENCOUNTER — Other Ambulatory Visit (HOSPITAL_COMMUNITY): Payer: Self-pay

## 2025-01-24 NOTE — Progress Notes (Unsigned)
" °  Cardiology Office Note:  .   Date:  01/24/2025  ID:  Breanna Ramirez, Breanna Ramirez 13-Aug-1956, MRN [id number] PCP: Austin Mutton, MD  [facility] Providers Cardiologist:  Newman JINNY Lawrence, MD {  History of Present Illness: .   Matika Bartell is a 69 y.o. female with history of chronic HFrEF, COPD, nicotine dependence.     SOB Referred to cardiology with swelling and proBNP 285.  EF preserved.  Lasix  increased to 40 mg.  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  Normal PCWP.  Diastolic heart failure felt to be less likely. 10/2024 Jardiance  started.  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  Mildly active     Patient was referred to cardiology for concerns of HFpEF and underwent RHC with normal PCWP and mild pulmonary hypertension, WHO group 3 with underlying COPD.  Lasix  increased to 40 mg reported improved symptoms but still with persistent edema.  I saw her after her RHC 10/2024 and started Jardiance .  Chronic HFpEF  Presentation seems multifactorial.  Primary driver likely due to the COPD and with wheezing on exam.  Echocardiogram with preserved biventricular function, G2 DD.  Elevated LVEDP.  Mild MR. 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  RA 8, wedge pressure 14 (borderline).  mPAP 25.  With normal PCWP, diastolic heart failure has been the primary driver for symptoms felt to be less likely.   Continue with Lasix  40 mg daily. Repeat echocardiogram 3 to 5 years for mitral regurgitation.   COPD/nicotine dependence Follows closely with pulmonology and chronically has required steroids.  She is working on smoking cessation. Slightly wheezy on today's exam. She has been on Lopressor  12.5 mg twice daily.  This is a low-dose with no clear indications for this.  Lopressor  may have some influence with her asthma/COPD.  May benefit from bisoprolol instead versus  discontinuation altogether.   Hyperlipidemia Continue rosuvastatin 40 mg.  LDL 79.   Anemia She is getting IV iron  infusions.  May be contributing.  ROS: Denies: Chest pain, shortness of breath, orthopnea, peripheral edema, palpitations, syncope, decreased exercise capacity, fatigue, dizziness.   Studies Reviewed: .         Risk Assessment/Calculations:   {Does this patient have ATRIAL FIBRILLATION?[phone]} No BP recorded.  {Refresh Note OR Click here to enter BP  :1}***       Physical Exam:   VS:  There were no vitals taken for this visit.   Wt Readings from Last 3 Encounters:  12/07/24 118 lb 6.4 oz (53.7 kg)  11/23/24 126 lb (57.2 kg)  11/22/24 126 lb (57.2 kg)    GEN: Well nourished, well developed in no acute distress NECK: No JVD; No carotid bruits CARDIAC: ***RRR, no murmurs, rubs, gallops RESPIRATORY:  Clear to auscultation without rales, wheezing or rhonchi  ABDOMEN: Soft, non-tender, non-distended EXTREMITIES:  No edema; No deformity   ASSESSMENT AND PLAN: .         {Are you ordering a CV Procedure (e.g. stress test, cath, DCCV, TEE, etc)?   Press F2        :[id number]}  Dispo: ***  Signed, Thom LITTIE Sluder, PA-C  "

## 2025-01-26 ENCOUNTER — Ambulatory Visit: Admitting: Cardiology

## 2025-01-27 ENCOUNTER — Inpatient Hospital Stay

## 2025-01-30 ENCOUNTER — Inpatient Hospital Stay

## 2025-02-02 ENCOUNTER — Inpatient Hospital Stay: Attending: Nurse Practitioner

## 2025-02-03 ENCOUNTER — Other Ambulatory Visit (HOSPITAL_COMMUNITY): Payer: Self-pay

## 2025-02-03 ENCOUNTER — Inpatient Hospital Stay

## 2025-02-06 ENCOUNTER — Encounter (HOSPITAL_BASED_OUTPATIENT_CLINIC_OR_DEPARTMENT_OTHER): Admitting: Radiology

## 2025-02-06 DIAGNOSIS — Z1231 Encounter for screening mammogram for malignant neoplasm of breast: Secondary | ICD-10-CM

## 2025-02-08 ENCOUNTER — Inpatient Hospital Stay: Attending: Nurse Practitioner

## 2025-02-22 ENCOUNTER — Ambulatory Visit: Admitting: Pulmonary Disease

## 2025-02-24 ENCOUNTER — Encounter (HOSPITAL_BASED_OUTPATIENT_CLINIC_OR_DEPARTMENT_OTHER): Admitting: Radiology

## 2025-03-10 ENCOUNTER — Ambulatory Visit: Admitting: Cardiology

## 2025-03-21 ENCOUNTER — Other Ambulatory Visit (HOSPITAL_BASED_OUTPATIENT_CLINIC_OR_DEPARTMENT_OTHER)

## 2025-06-05 ENCOUNTER — Inpatient Hospital Stay

## 2025-06-05 ENCOUNTER — Inpatient Hospital Stay: Attending: Nurse Practitioner | Admitting: Nurse Practitioner
